# Patient Record
Sex: Female | Born: 1945
Health system: Southern US, Community
[De-identification: ages and names within clinical notes are randomized; demographics above are authoritative.]

## PROBLEM LIST (undated history)

## (undated) DIAGNOSIS — M109 Gout, unspecified: Secondary | ICD-10-CM

## (undated) DIAGNOSIS — K5900 Constipation, unspecified: Secondary | ICD-10-CM

## (undated) DIAGNOSIS — J189 Pneumonia, unspecified organism: Secondary | ICD-10-CM

## (undated) DIAGNOSIS — I10 Essential (primary) hypertension: Secondary | ICD-10-CM

## (undated) DIAGNOSIS — B1920 Unspecified viral hepatitis C without hepatic coma: Secondary | ICD-10-CM

## (undated) DIAGNOSIS — M51369 Other intervertebral disc degeneration, lumbar region without mention of lumbar back pain or lower extremity pain: Secondary | ICD-10-CM

## (undated) DIAGNOSIS — M5136 Other intervertebral disc degeneration, lumbar region: Secondary | ICD-10-CM

## (undated) DIAGNOSIS — R011 Cardiac murmur, unspecified: Secondary | ICD-10-CM

## (undated) DIAGNOSIS — Z9289 Personal history of other medical treatment: Secondary | ICD-10-CM

## (undated) DIAGNOSIS — J449 Chronic obstructive pulmonary disease, unspecified: Secondary | ICD-10-CM

## (undated) DIAGNOSIS — F419 Anxiety disorder, unspecified: Secondary | ICD-10-CM

## (undated) DIAGNOSIS — M5030 Other cervical disc degeneration, unspecified cervical region: Secondary | ICD-10-CM

## (undated) DIAGNOSIS — M1990 Unspecified osteoarthritis, unspecified site: Secondary | ICD-10-CM

## (undated) HISTORY — PX: BACK SURGERY: SHX140

## (undated) HISTORY — DX: Gout, unspecified: M10.9

## (undated) HISTORY — PX: COLONOSCOPY W/ POLYPECTOMY: SHX1380

## (undated) HISTORY — PX: APPENDECTOMY: SHX54

## (undated) HISTORY — DX: Essential (primary) hypertension: I10

## (undated) HISTORY — PX: ABDOMINAL HYSTERECTOMY: SHX81

## (undated) HISTORY — DX: Unspecified viral hepatitis C without hepatic coma: B19.20

## (undated) HISTORY — DX: Unspecified osteoarthritis, unspecified site: M19.90

## (undated) HISTORY — PX: INNER EAR SURGERY: SHX679

## (undated) HISTORY — PX: TONSILLECTOMY: SUR1361

---

## 1995-06-04 DIAGNOSIS — B1710 Acute hepatitis C without hepatic coma: Secondary | ICD-10-CM | POA: Insufficient documentation

## 1998-07-02 ENCOUNTER — Emergency Department (HOSPITAL_COMMUNITY): Admission: EM | Admit: 1998-07-02 | Discharge: 1998-07-02 | Payer: Self-pay | Admitting: Endocrinology

## 1998-07-02 ENCOUNTER — Encounter: Payer: Self-pay | Admitting: Endocrinology

## 1998-07-06 ENCOUNTER — Ambulatory Visit (HOSPITAL_COMMUNITY): Admission: RE | Admit: 1998-07-06 | Discharge: 1998-07-06 | Payer: Self-pay | Admitting: Podiatry

## 1999-03-04 ENCOUNTER — Emergency Department (HOSPITAL_COMMUNITY): Admission: EM | Admit: 1999-03-04 | Discharge: 1999-03-04 | Payer: Self-pay | Admitting: Emergency Medicine

## 1999-10-16 ENCOUNTER — Encounter: Admission: RE | Admit: 1999-10-16 | Discharge: 1999-10-16 | Payer: Self-pay | Admitting: Cardiology

## 1999-10-16 ENCOUNTER — Encounter: Payer: Self-pay | Admitting: Cardiology

## 2000-04-27 ENCOUNTER — Emergency Department (HOSPITAL_COMMUNITY): Admission: EM | Admit: 2000-04-27 | Discharge: 2000-04-27 | Payer: Self-pay | Admitting: Emergency Medicine

## 2000-04-27 ENCOUNTER — Encounter: Payer: Self-pay | Admitting: Emergency Medicine

## 2000-07-14 ENCOUNTER — Inpatient Hospital Stay (HOSPITAL_COMMUNITY): Admission: EM | Admit: 2000-07-14 | Discharge: 2000-07-21 | Payer: Self-pay | Admitting: *Deleted

## 2000-07-16 ENCOUNTER — Encounter: Payer: Self-pay | Admitting: Cardiology

## 2000-11-02 ENCOUNTER — Encounter: Payer: Self-pay | Admitting: Emergency Medicine

## 2000-11-02 ENCOUNTER — Emergency Department (HOSPITAL_COMMUNITY): Admission: EM | Admit: 2000-11-02 | Discharge: 2000-11-02 | Payer: Self-pay | Admitting: Emergency Medicine

## 2001-11-04 ENCOUNTER — Ambulatory Visit (HOSPITAL_COMMUNITY): Admission: RE | Admit: 2001-11-04 | Discharge: 2001-11-04 | Payer: Self-pay | Admitting: Cardiology

## 2001-11-04 ENCOUNTER — Encounter: Payer: Self-pay | Admitting: Cardiology

## 2002-07-26 ENCOUNTER — Encounter: Admission: RE | Admit: 2002-07-26 | Discharge: 2002-07-26 | Payer: Self-pay | Admitting: Infectious Diseases

## 2002-08-04 ENCOUNTER — Ambulatory Visit (HOSPITAL_COMMUNITY): Admission: RE | Admit: 2002-08-04 | Discharge: 2002-08-04 | Payer: Self-pay | Admitting: Infectious Diseases

## 2002-08-04 ENCOUNTER — Encounter: Payer: Self-pay | Admitting: Infectious Diseases

## 2003-01-16 ENCOUNTER — Encounter: Payer: Self-pay | Admitting: Emergency Medicine

## 2003-01-16 ENCOUNTER — Emergency Department (HOSPITAL_COMMUNITY): Admission: EM | Admit: 2003-01-16 | Discharge: 2003-01-16 | Payer: Self-pay | Admitting: Emergency Medicine

## 2003-06-25 ENCOUNTER — Emergency Department (HOSPITAL_COMMUNITY): Admission: EM | Admit: 2003-06-25 | Discharge: 2003-06-25 | Payer: Self-pay | Admitting: Emergency Medicine

## 2003-09-15 ENCOUNTER — Ambulatory Visit (HOSPITAL_COMMUNITY): Admission: RE | Admit: 2003-09-15 | Discharge: 2003-09-15 | Payer: Self-pay | Admitting: Gastroenterology

## 2003-09-29 ENCOUNTER — Ambulatory Visit (HOSPITAL_COMMUNITY): Admission: RE | Admit: 2003-09-29 | Discharge: 2003-09-29 | Payer: Self-pay | Admitting: Gastroenterology

## 2003-10-27 ENCOUNTER — Emergency Department (HOSPITAL_COMMUNITY): Admission: EM | Admit: 2003-10-27 | Discharge: 2003-10-27 | Payer: Self-pay | Admitting: Emergency Medicine

## 2003-11-24 ENCOUNTER — Ambulatory Visit (HOSPITAL_COMMUNITY): Admission: RE | Admit: 2003-11-24 | Discharge: 2003-11-24 | Payer: Self-pay | Admitting: Orthopedic Surgery

## 2003-12-29 ENCOUNTER — Encounter: Admission: RE | Admit: 2003-12-29 | Discharge: 2004-02-10 | Payer: Self-pay | Admitting: Orthopedic Surgery

## 2004-10-24 ENCOUNTER — Encounter: Admission: RE | Admit: 2004-10-24 | Discharge: 2004-10-24 | Payer: Self-pay | Admitting: Cardiology

## 2004-11-09 ENCOUNTER — Encounter: Admission: RE | Admit: 2004-11-09 | Discharge: 2004-11-09 | Payer: Self-pay | Admitting: Cardiology

## 2005-05-22 ENCOUNTER — Encounter: Admission: RE | Admit: 2005-05-22 | Discharge: 2005-05-22 | Payer: Self-pay | Admitting: Cardiology

## 2005-06-21 ENCOUNTER — Ambulatory Visit: Payer: Self-pay | Admitting: Gastroenterology

## 2005-11-21 ENCOUNTER — Ambulatory Visit (HOSPITAL_COMMUNITY): Admission: RE | Admit: 2005-11-21 | Discharge: 2005-11-21 | Payer: Self-pay | Admitting: Cardiology

## 2006-10-21 ENCOUNTER — Ambulatory Visit: Payer: Self-pay | Admitting: Gastroenterology

## 2006-10-24 ENCOUNTER — Ambulatory Visit (HOSPITAL_COMMUNITY): Admission: RE | Admit: 2006-10-24 | Discharge: 2006-10-24 | Payer: Self-pay | Admitting: Gastroenterology

## 2006-12-10 ENCOUNTER — Ambulatory Visit (HOSPITAL_COMMUNITY): Admission: RE | Admit: 2006-12-10 | Discharge: 2006-12-10 | Payer: Self-pay | Admitting: Cardiology

## 2007-02-05 ENCOUNTER — Ambulatory Visit: Payer: Self-pay | Admitting: Gastroenterology

## 2007-06-18 ENCOUNTER — Emergency Department (HOSPITAL_COMMUNITY): Admission: EM | Admit: 2007-06-18 | Discharge: 2007-06-18 | Payer: Self-pay | Admitting: Emergency Medicine

## 2008-01-15 ENCOUNTER — Ambulatory Visit (HOSPITAL_COMMUNITY): Admission: RE | Admit: 2008-01-15 | Discharge: 2008-01-15 | Payer: Self-pay | Admitting: Cardiology

## 2008-02-23 ENCOUNTER — Ambulatory Visit: Payer: Self-pay | Admitting: Gastroenterology

## 2008-05-13 ENCOUNTER — Ambulatory Visit (HOSPITAL_COMMUNITY): Admission: RE | Admit: 2008-05-13 | Discharge: 2008-05-13 | Payer: Self-pay | Admitting: Gastroenterology

## 2008-07-12 ENCOUNTER — Ambulatory Visit: Payer: Self-pay | Admitting: Gastroenterology

## 2008-10-29 ENCOUNTER — Emergency Department (HOSPITAL_COMMUNITY): Admission: EM | Admit: 2008-10-29 | Discharge: 2008-10-29 | Payer: Self-pay | Admitting: Emergency Medicine

## 2008-11-29 ENCOUNTER — Ambulatory Visit: Payer: Self-pay | Admitting: Gastroenterology

## 2009-01-06 ENCOUNTER — Inpatient Hospital Stay (HOSPITAL_COMMUNITY): Admission: AD | Admit: 2009-01-06 | Discharge: 2009-01-06 | Payer: Self-pay | Admitting: Obstetrics & Gynecology

## 2009-01-20 ENCOUNTER — Ambulatory Visit (HOSPITAL_COMMUNITY): Admission: RE | Admit: 2009-01-20 | Discharge: 2009-01-20 | Payer: Self-pay | Admitting: Cardiology

## 2009-03-23 ENCOUNTER — Emergency Department (HOSPITAL_COMMUNITY): Admission: EM | Admit: 2009-03-23 | Discharge: 2009-03-23 | Payer: Self-pay | Admitting: Emergency Medicine

## 2009-03-23 ENCOUNTER — Ambulatory Visit: Payer: Self-pay | Admitting: Gastroenterology

## 2009-04-29 ENCOUNTER — Emergency Department (HOSPITAL_COMMUNITY): Admission: EM | Admit: 2009-04-29 | Discharge: 2009-04-29 | Payer: Self-pay | Admitting: Family Medicine

## 2009-07-14 ENCOUNTER — Ambulatory Visit (HOSPITAL_COMMUNITY): Admission: RE | Admit: 2009-07-14 | Discharge: 2009-07-14 | Payer: Self-pay | Admitting: Gastroenterology

## 2009-11-02 ENCOUNTER — Ambulatory Visit: Payer: Self-pay | Admitting: Gastroenterology

## 2009-12-12 ENCOUNTER — Emergency Department (HOSPITAL_COMMUNITY): Admission: EM | Admit: 2009-12-12 | Discharge: 2009-12-12 | Payer: Self-pay | Admitting: Emergency Medicine

## 2010-02-09 ENCOUNTER — Ambulatory Visit (HOSPITAL_COMMUNITY): Admission: RE | Admit: 2010-02-09 | Discharge: 2010-02-09 | Payer: Self-pay | Admitting: Cardiology

## 2010-03-02 ENCOUNTER — Emergency Department (HOSPITAL_COMMUNITY): Admission: EM | Admit: 2010-03-02 | Discharge: 2010-03-03 | Payer: Self-pay | Admitting: Emergency Medicine

## 2010-03-03 ENCOUNTER — Emergency Department (HOSPITAL_COMMUNITY): Admission: EM | Admit: 2010-03-03 | Discharge: 2010-03-03 | Payer: Self-pay | Admitting: Family Medicine

## 2010-03-04 ENCOUNTER — Inpatient Hospital Stay (HOSPITAL_COMMUNITY): Admission: EM | Admit: 2010-03-04 | Discharge: 2010-03-10 | Payer: Self-pay | Admitting: Emergency Medicine

## 2010-03-07 LAB — CONVERTED CEMR LAB
CO2: 26 meq/L
Calcium: 8.9 mg/dL
Chloride: 101 meq/L
Potassium: 4.1 meq/L
Sodium: 134 meq/L

## 2010-03-09 LAB — CONVERTED CEMR LAB: TSH: 3.076 microintl units/mL

## 2010-03-10 ENCOUNTER — Encounter (INDEPENDENT_AMBULATORY_CARE_PROVIDER_SITE_OTHER): Payer: Self-pay | Admitting: Nurse Practitioner

## 2010-03-22 ENCOUNTER — Encounter (INDEPENDENT_AMBULATORY_CARE_PROVIDER_SITE_OTHER): Payer: Self-pay | Admitting: Nurse Practitioner

## 2010-03-22 ENCOUNTER — Ambulatory Visit: Payer: Self-pay | Admitting: Internal Medicine

## 2010-03-22 DIAGNOSIS — I10 Essential (primary) hypertension: Secondary | ICD-10-CM | POA: Insufficient documentation

## 2010-03-22 DIAGNOSIS — F411 Generalized anxiety disorder: Secondary | ICD-10-CM | POA: Insufficient documentation

## 2010-03-22 DIAGNOSIS — J45909 Unspecified asthma, uncomplicated: Secondary | ICD-10-CM | POA: Insufficient documentation

## 2010-03-22 DIAGNOSIS — K5900 Constipation, unspecified: Secondary | ICD-10-CM | POA: Insufficient documentation

## 2010-04-06 ENCOUNTER — Ambulatory Visit: Payer: Self-pay | Admitting: Nurse Practitioner

## 2010-04-16 ENCOUNTER — Telehealth (INDEPENDENT_AMBULATORY_CARE_PROVIDER_SITE_OTHER): Payer: Self-pay | Admitting: Nurse Practitioner

## 2010-04-17 ENCOUNTER — Telehealth (INDEPENDENT_AMBULATORY_CARE_PROVIDER_SITE_OTHER): Payer: Self-pay | Admitting: Nurse Practitioner

## 2010-05-14 ENCOUNTER — Encounter (INDEPENDENT_AMBULATORY_CARE_PROVIDER_SITE_OTHER): Payer: Self-pay | Admitting: Nurse Practitioner

## 2010-05-24 ENCOUNTER — Ambulatory Visit: Payer: Self-pay | Admitting: Nurse Practitioner

## 2010-05-31 ENCOUNTER — Ambulatory Visit: Payer: Self-pay | Admitting: Nurse Practitioner

## 2010-06-01 ENCOUNTER — Ambulatory Visit: Payer: Self-pay | Admitting: Nurse Practitioner

## 2010-06-22 ENCOUNTER — Telehealth (INDEPENDENT_AMBULATORY_CARE_PROVIDER_SITE_OTHER): Payer: Self-pay | Admitting: Nurse Practitioner

## 2010-06-24 ENCOUNTER — Encounter: Payer: Self-pay | Admitting: Orthopedic Surgery

## 2010-07-03 NOTE — Letter (Signed)
Summary: Handout Printed  Printed Handout:  - Diet - High-Fiber 

## 2010-07-03 NOTE — Progress Notes (Signed)
Summary: NEED THE [name] INSTEAD OF THE TRAMAOL  Phone Note Call from Patient Call back at [facility] Phone [phone] Call back at [phone] (cell)   Reason for Call: Refill Medication Summary of Call: Ashley Riddle CALLED BACK TO SAYS THAT SHE WAS SORRY, BUT SHE GAVE THE WRONG NAME OF  MEDICATION TO BE REFILLED. SHE MEANT HER XANAX NOT THE TRAMADOL. Initial call taken by: Leodis Rains,  April 17, 2010 12:41 PM  Follow-up for Phone Call        she was given rx for alprazolam on 03/22/2010 this is a 30 day supply - as reviewed with pt xanax is a SHORT acting medication so her need increases if she takes it on a day to day basis.  it is really intended to take 2-3 times per WEEK.  if she takes more than that she needs a longer acting medication in ADDITION to the xanax.  This was discussed during recent visit. I still think she needs a longer acting medication and will be willi ng to start this for her (what pharmacy does she use)  she will only get 30 xanax per month and they are not due until 04/22/2010. the goal would be to use them sparingly Follow-up by: Lehman Prom FNP,  April 17, 2010 1:10 PM  Additional Follow-up for Phone Call Additional follow up Details #1::        Left message on answering machine for pt. to return call.  Dutch Quint RN  April 17, 2010 2:14 PM  Advised as to provider's response and recommendations. States she only takes one Xanax a day at 4 pm and has done that for years.  Explained re short-acting and best as advised.  Pt. doesn't want to "go through all those changes -- never mind." Dutch Quint RN  April 17, 2010 4:40 PM     Additional Follow-up for Phone Call Additional follow up Details #2::    She has identified the problem - she has been taking it for "years" Hence she is dependent upon it. she will get no more than 30 per month.  Again would advise she start a long acting medication IN ADDITION to the xanax for maximum benefit  then she may see that she doesn't have to take the xanax every day but only if needed. since the 20th is on a sunday - i will refill xanax on Monday November 21st. what pharmacy does she want it sent to? Follow-up by: Lehman Prom FNP,  April 18, 2010 8:10 AM  Additional Follow-up for Phone Call Additional follow up Details #3:: Details for Additional Follow-up Action Taken: Left message with female for pt. to return call.  Dutch Quint RN  April 19, 2010 12:42 PM  Pt. found her pills she has enough to last until 04/22/10. She does not want to add any additional medication until she talks with the lady you had suggested. [facility] [address]. Gaylyn Cheers RN  April 20, 2010 4:38 PM

## 2010-07-03 NOTE — Progress Notes (Signed)
Summary: MEDS REFILL  Phone Note Call from Patient Call back at [phone]   Summary of Call: LISINOPRIL ([facility]) [place] AND TRAMADOL ([facility]) Initial call taken by: Domenic Polite,  April 16, 2010 4:47 PM  Follow-up for Phone Call        rx sent to pharmacies as per her request notify pt Follow-up by: Lehman Prom FNP,  April 16, 2010 6:46 PM  Additional Follow-up for Phone Call Additional follow up Details #1::        Pt called and was notified that rx's were sent  to pharmacy. Additional Follow-up by: Cheryll Dessert,  April 17, 2010 11:58 AM    Prescriptions: TRAMADOL HCL 50 MG TABS (TRAMADOL HCL) One tablet by mouth two times a day as needed for pain  #50 x 0   Entered and Authorized by:   Lehman Prom FNP   Signed by:   Lehman Prom FNP on 04/16/2010   Method used:   [facility]        [facility]  [address] [phone]* (retail)       [address]       [place], [place]  [address]       Ph: [phone] or [phone]       Fax: [phone]   RxID:   [id number] LISINOPRIL 20 MG TABS (LISINOPRIL) Two tablet by mouth daily for blood pressure  #60 x 1   Entered and Authorized by:   Lehman Prom FNP   Signed by:   Lehman Prom FNP on 04/16/2010   Method used:   Electronically to        [facility] [phone]* (retail)       [address]       [place], [place]  [address]       Ph: [phone]       Fax: [phone]   RxID:   [id number]

## 2010-07-03 NOTE — Assessment & Plan Note (Signed)
Summary: NEW - Establish Care   Vital Signs:  Patient profile:   65 year old female Height:      62.25 inches Weight:      174.6 pounds BMI:     31.79 Temp:     97.5 degrees F oral Pulse rate:   72 / minute Pulse rhythm:   regular Resp:     16 per minute BP sitting:   166 / 96  (left arm) Cuff size:   regular  Vitals Entered By: Levon Hedger (March 22, 2010 3:28 PM)  Nutrition Counseling: Patient's BMI is greater than 25 and therefore counseled on weight management options. CC: follow-up visit [facility] ...still having some tenderness in stomach, Hypertension Management Is Patient Diabetic? No Pain Assessment Patient in pain? no       Does patient need assistance? Functional Status Self care Ambulation Normal   CC:  follow-up visit [facility] ...still having some tenderness in stomach and Hypertension Management.  History of Present Illness:  Pt into the office to establish care. Previous PCP was Dr. Shana Chute.  Last visit there was 1 year ago.  Hospitalized 03/03/2010 - 03/10/2010 for abdominal pain. Pain was present for 1 week before presentaon to the ER.  Pain progressed.  1.  Acute adominal pain most likely due for pain/obstruction.    2.  Constipation - pt was treated accordingly in the hospital  3.  Hypotension, transient - Blood pressure medication were held while pt was in the hospital.  She has not restarted them at time of visit today.  4.  Asthma   Social - Employed at [employer]  Hypertension History:      She denies headache, chest pain, and palpitations.        Positive major cardiovascular risk factors include female age 17 years old or older and hypertension.  Negative major cardiovascular risk factors include no history of diabetes or hyperlipidemia and non-tobacco-user status.     Habits & Providers  Alcohol-Tobacco-Diet     Alcohol drinks/day: 0     Tobacco Status: quit     Tobacco Counseling: to remain off tobacco products     Year  Started: age 22     Year Quit: 03/2010  Exercise-Depression-Behavior     Drug Use: past  Allergies (verified): 1)  ! Penicillin 2)  ! Codeine  Past History:  Past Surgical History: C- section x 3 Appendectomy lumbar disectomy in 1989 Hemorrhoidectomy  Family History: mother - diabetes, breast cancer (deceased) multiple maternal aunts with breast cancer father - noncontributory  Social History: 3 children  tobacco - quit 03/2010 ETOH - none Drug - clean from heroin for at least 20 yearsSmoking Status:  quit Drug Use:  past  Review of Systems General:  Denies fever. CV:  Denies chest pain or discomfort. Resp:  Denies cough. GI:  Denies abdominal pain, nausea, and vomiting.  Physical Exam  General:  alert.   Head:  normocephalic.   Ears:  ear piercing(s) noted.   Lungs:  normal breath sounds.   Heart:  normal rate and regular rhythm.   Neurologic:  alert & oriented X3.     Impression & Recommendations:  Problem # 1:  ANXIETY (ICD-300.00) advised pt that if she is taking xanax daily then she will need a longer acting medication Her updated medication list for this problem includes:    Alprazolam 1 Mg Tabs (Alprazolam) ..... One tablet by mouth daily as needed for anxiety  Problem # 2:  HYPERTENSION, BENIGN ESSENTIAL (ICD-401.1)   BP is elevated today Will start lisinopril 20mg    Her updated medication list for this problem includes:    Lisinopril 20 Mg Tabs (Lisinopril) ..... One tablet by mouth daily for blood pressure  Problem # 3:  HEPATITIS C (ICD-070.51) ongoing f/u with Medical specialty services  Problem # 4:  NEED PROPHYLACTIC VACCINATION&INOCULATION FLU (ICD-V04.81) give today  Problem # 5:  CONSTIPATION (ICD-564.00) advise pt to eat high fiber diet  Complete Medication List: 1)  Aspirin 81 Mg Tabs (Aspirin) .... One tablet by mouth daily 2)  Multivitamins Tabs (Multiple vitamin) .... One tablet by mouth daily 3)  Alprazolam 1 Mg Tabs  (Alprazolam) .... One tablet by mouth daily as needed for anxiety 4)  Tramadol Hcl 50 Mg Tabs (Tramadol hcl) .... One tablet by mouth two times a day as needed for pain 5)  Lisinopril 20 Mg Tabs (Lisinopril) .... One tablet by mouth daily for blood pressure  Other Orders: Flu Vaccine 63yrs + (40347) Admin 1st Vaccine (42595)  Hypertension Assessment/Plan:      The patient's hypertensive risk group is category B: At least one risk factor (excluding diabetes) with no target organ damage.  Today's blood pressure is 166/96.  Her blood pressure goal is < 140/90.  Patient Instructions: 1)  Be sure to drink plenty of water 2)  High fiber diet with raisins, bran bread, apples. 3)  You have received the flu vaccine today 4)  Blood pressure - start lisinopril 20mg  by mouth daily. Remember to avoiid salty foods 5)  Follow up in 3 weeks with n.martin,[facility]p for blood pressure Prescriptions: LISINOPRIL 20 MG TABS (LISINOPRIL) One tablet by mouth daily for blood pressure  #30 x 1   Entered and Authorized by:   Lehman Prom FNP   Signed by:   Lehman Prom FNP on 03/22/2010   Method used:   Print then Give to Patient   RxID:   [id number] ALPRAZOLAM 1 MG TABS (ALPRAZOLAM) One tablet by mouth daily as needed for anxiety  #30 x 0   Entered and Authorized by:   Lehman Prom FNP   Signed by:   Lehman Prom FNP on 03/22/2010   Method used:   Print then Give to Patient   RxID:   [id number]    Orders Added: 1)  New Patient Level III [55732] 2)  Flu Vaccine 5yrs + [20254] 3)  Admin 1st Vaccine [27062]   Immunizations Administered:  Influenza Vaccine # 1:    Vaccine Type: Fluvax 3+    Site: right deltoid    Mfr: GlaxoSmithKline    Dose: 0.5 ml    Route: IM    Given by: Levon Hedger    Exp. Date: 12/01/2010    Lot #: [id number]    VIS given: 12/26/09 version given March 22, 2010.  Flu Vaccine Consent Questions:    Do you have a history of severe allergic reactions to  this vaccine? no    Any prior history of allergic reactions to egg and/or gelatin? no    Do you have a sensitivity to the preservative Thimersol? no    Do you have a past history of Guillan-Barre Syndrome? no    Do you currently have an acute febrile illness? no    Have you ever had a severe reaction to latex? no    Vaccine information given and explained to patient? yes    Are you currently pregnant? no    ndc  [phone]  Immunizations  Administered:  Influenza Vaccine # 1:    Vaccine Type: Fluvax 3+    Site: right deltoid    Mfr: GlaxoSmithKline    Dose: 0.5 ml    Route: IM    Given by: Levon Hedger    Exp. Date: 12/01/2010    Lot #: [id number]    VIS given: 12/26/09 version given March 22, 2010.   CT of Abdomen  Procedure date:  03/03/2010  Findings:      cirrhotic liver with minimal splenic enlargement, tiny umbilical hernia containing fat, no definitive acute intra-abdominal abnormalities  X-ray  Procedure date:  03/04/2010  Findings:      Abdominal films stable mild chronic bronchitic changes and mild chronic interstitial lung disease, stable prominent aortic arch, interval minimal, left basilar atelectasis or scarring  [place] of Abdomen  Procedure date:  03/04/2010  Findings:      showed subotimal visualization of aorta and pancreas due to bowel gas, questionable slightly, nodule or contours of liver which could reflect cirrhosis  MISC. Report  Procedure date:  03/04/2010  Findings:      CT angiogram - scattered atherosclerosis in the aorta, maor branch vessels are patent.  No evidence of mesentary ischemia.  Findings compatible with cirrhosis. No evidence of portal venous hypertension  EGD  Procedure date:  03/05/2010  Findings:      normal  X-ray  Procedure date:  03/08/2010  Findings:      No acute findings, moderate stool burden in ascending colon repeat on March 10, 2010 - decreased stool burden, no findings for obstruction or  perforation   CT of Abdomen  Procedure date:  03/03/2010  Findings:      cirrhotic liver with minimal splenic enlargement, tiny umbilical hernia containing fat, no definitive acute intra-abdominal abnormalities  X-ray  Procedure date:  03/04/2010  Findings:      Abdominal films stable mild chronic bronchitic changes and mild chronic interstitial lung disease, stable prominent aortic arch, interval minimal, left basilar atelectasis or scarring  [place] of Abdomen  Procedure date:  03/04/2010  Findings:      showed subotimal visualization of aorta and pancreas due to bowel gas, questionable slightly, nodule or contours of liver which could reflect cirrhosis  MISC. Report  Procedure date:  03/04/2010  Findings:      CT angiogram - scattered atherosclerosis in the aorta, maor branch vessels are patent.  No evidence of mesentary ischemia.  Findings compatible with cirrhosis. No evidence of portal venous hypertension  EGD  Procedure date:  03/05/2010  Findings:      normal  X-ray  Procedure date:  03/08/2010  Findings:      No acute findings, moderate stool burden in ascending colon repeat on March 10, 2010 - decreased stool burden, no findings for obstruction or perforation

## 2010-07-03 NOTE — Assessment & Plan Note (Signed)
Summary: HTN   Vital Signs:  Patient profile:   65 year old female Weight:      177.0 pounds Temp:     97.5 degrees F oral Pulse rate:   72 / minute Pulse rhythm:   regular Resp:     16 per minute BP sitting:   170 / 78  (left arm) Cuff size:   regular  Vitals Entered By: Levon Hedger (April 06, 2010 2:04 PM) CC: follow-up visit 2 weeks BP, Hypertension Management, Depression Is Patient Diabetic? No Pain Assessment Patient in pain? no       Does patient need assistance? Functional Status Self care Ambulation Normal   CC:  follow-up visit 2 weeks BP, Hypertension Management, and Depression.  History of Present Illness:  Pt into the office for f/u on high blood pressure. Pt has started lisinopril 20mg  by mouth daily for blood pressure.  She presents today with all her medications. The patient denies that she feels like life is not worth living, denies that she wishes that she were dead, and denies that she has thought about ending her life.         Hypertension History:      She denies headache, chest pain, and palpitations.        Positive major cardiovascular risk factors include female age 17 years old or older and hypertension.  Negative major cardiovascular risk factors include no history of diabetes or hyperlipidemia and non-tobacco-user status.      Habits & Providers  Alcohol-Tobacco-Diet     Alcohol drinks/day: 0     Tobacco Status: quit     Tobacco Counseling: to remain off tobacco products     Year Started: age 26     Year Quit: 03/2010  Exercise-Depression-Behavior     Have you felt down or hopeless? yes     Have you felt little pleasure in things? yes     Depression Counseling: not indicated; screening negative for depression     Drug Use: past  Medications Prior to Update: 1)  Aspirin 81 Mg Tabs (Aspirin) .... One Tablet By Mouth Daily 2)  Multivitamins  Tabs (Multiple Vitamin) .... One Tablet By Mouth Daily 3)  Alprazolam 1 Mg Tabs  (Alprazolam) .... One Tablet By Mouth Daily As Needed For Anxiety 4)  Tramadol Hcl 50 Mg Tabs (Tramadol Hcl) .... One Tablet By Mouth Two Times A Day As Needed For Pain 5)  Lisinopril 20 Mg Tabs (Lisinopril) .... One Tablet By Mouth Daily For Blood Pressure  Current Medications (verified): 1)  Aspirin 81 Mg Tabs (Aspirin) .... One Tablet By Mouth Daily 2)  Multivitamins  Tabs (Multiple Vitamin) .... One Tablet By Mouth Daily 3)  Alprazolam 1 Mg Tabs (Alprazolam) .... One Tablet By Mouth Daily As Needed For Anxiety 4)  Tramadol Hcl 50 Mg Tabs (Tramadol Hcl) .... One Tablet By Mouth Two Times A Day As Needed For Pain 5)  Lisinopril 20 Mg Tabs (Lisinopril) .... One Tablet By Mouth Daily For Blood Pressure  Allergies (verified): 1)  ! Penicillin 2)  ! Codeine  Review of Systems General:  Denies fever. CV:  Denies chest pain or discomfort. Resp:  Denies cough. GI:  Denies abdominal pain, nausea, and vomiting.  Physical Exam  General:  alert.   Head:  normocephalic.   Ears:  ear piercing(s) noted.   Lungs:  normal breath sounds.   Heart:  normal rate and regular rhythm.   Abdomen:  normal bowel sounds.  Msk:  up to the exam table Neurologic:  alert & oriented X3.   Psych:  tearful during exam   Impression & Recommendations:  Problem # 1:  HYPERTENSION, BENIGN ESSENTIAL (ICD-401.1) BP still elevated today will increase lisinopril Her updated medication list for this problem includes:    Lisinopril 20 Mg Tabs (Lisinopril) .Marland Kitchen..Marland Kitchen Two tablet by mouth daily for blood pressure  Problem # 2:  CONSTIPATION (ICD-564.00) stable at this time Her updated medication list for this problem includes:    Lactulose 10 Gm/38ml Soln (Lactulose) .Marland KitchenMarland KitchenMarland KitchenMarland Kitchen 15-76ml by mouth daily as needed    Docusate Sodium 100 Mg Caps (Docusate sodium) ..... One capsule by mouth daily with senna    Senokot S 8.6-50 Mg Tabs (Sennosides-docusate sodium) .Marland Kitchen... 2 tablets at bedtime for docusate capsule  Problem # 3:   ANXIETY (ICD-300.00)  Her updated medication list for this problem includes:    Alprazolam 1 Mg Tabs (Alprazolam) ..... One tablet by mouth daily as needed for anxiety  Orders: Misc. Referral (Misc. Ref)  Problem # 4:  HEPATITIS C (ICD-070.51) pt to keep up with appt with liver specialist as ordered  Complete Medication List: 1)  Aspirin 81 Mg Tabs (Aspirin) .... One tablet by mouth daily 2)  Multivitamins Tabs (Multiple vitamin) .... One tablet by mouth daily 3)  Alprazolam 1 Mg Tabs (Alprazolam) .... One tablet by mouth daily as needed for anxiety 4)  Tramadol Hcl 50 Mg Tabs (Tramadol hcl) .... One tablet by mouth two times a day as needed for pain 5)  Lisinopril 20 Mg Tabs (Lisinopril) .... Two tablet by mouth daily for blood pressure 6)  Polyethylene Glycol 3350 Liqd (Polyethylene glycol 3350) .Marland Kitchen.. 1 capful mixed with 8 ounces of wate daily 7)  Lactulose 10 Gm/58ml Soln (Lactulose) .Marland KitchenMarland KitchenMarland Kitchen 15-75ml by mouth daily as needed 8)  Omeprazole 20 Mg Cpdr (Omeprazole) .... One tablet by mouth daily before breakfast 9)  Docusate Sodium 100 Mg Caps (Docusate sodium) .... One capsule by mouth daily with senna 10)  Senokot S 8.6-50 Mg Tabs (Sennosides-docusate sodium) .... 2 tablets at bedtime for docusate capsule  Hypertension Assessment/Plan:      The patient's hypertensive risk group is category B: At least one risk factor (excluding diabetes) with no target organ damage.  Today's blood pressure is 170/78.  Her blood pressure goal is < 140/90.  Patient Instructions: 1)  Blood pressure is still elevated.  Increase lisinopril 20mg  - 2 tablets by mouth daily.  Use the tablets that you have already. 2)  Blood pressure check with triage nurse in 7-10 days. 3)  Take your blood pressure medications before this visit. 4)  Schedule an appointment with Aquilla Solian for counseling. 5)  Schedule an appointment for a complete physical exam. 6)  You will need PAP, mammogram, EKG   Orders Added: 1)   Est. Patient Level III [27253] 2)  Misc. Referral [Misc. Ref]

## 2010-07-03 NOTE — Letter (Signed)
Summary: Discharge Summary  Discharge Summary   Imported By: Arta Bruce 03/27/2010 12:36:31  _____________________________________________________________________  External Attachment:    Type:   Image     Comment:   External Document

## 2010-07-05 NOTE — Letter (Signed)
Summary: MAILED REQUESTED RECORDS TO [facility]  MAILED REQUESTED RECORDS TO [facility]   Imported By: Arta Bruce 05/14/2010 10:57:23  _____________________________________________________________________  External Attachment:    Type:   Image     Comment:   External Document

## 2010-07-05 NOTE — Progress Notes (Signed)
Summary: refill on lisinopril  Phone Note Call from Patient Call back at Home Phone [phone]   Reason for Call: Refill Medication Summary of Call: Ashley PT. Ashley Riddle SAYS SHE NEEDS A REFILL ON HER LISINOPRIL AT [facility] ON [address]. Initial call taken by: Leodis Rains,  June 22, 2010 12:51 PM  Follow-up for Phone Call        Rx sent to [facility] [address] as per note notify pt to check there Follow-up by: Lehman Prom FNP,  June 22, 2010 2:23 PM  Additional Follow-up for Phone Call Additional follow up Details #1::        Left message with female for pt. to return call.  Dutch Quint RN  June 22, 2010 2:51 PM     Additional Follow-up for Phone Call Additional follow up Details #2::    Ashley Riddle CALLED BACK, AND SHE IS AWARE THAT HER RX WAS SENT TO [facility]. Follow-up by: Leodis Rains,  June 22, 2010 4:03 PM  Prescriptions: LISINOPRIL 20 MG TABS (LISINOPRIL) Two tablet by mouth daily for blood pressure  #60 x 1   Entered and Authorized by:   Lehman Prom FNP   Signed by:   Lehman Prom FNP on 06/22/2010   Method used:   Electronically to        [facility] [phone]* (retail)       [address]       [place], [place]  [address]       Ph: [phone]       Fax: [phone]   RxID:   [id number]

## 2010-07-16 ENCOUNTER — Encounter: Payer: Self-pay | Admitting: Nurse Practitioner

## 2010-07-16 ENCOUNTER — Encounter (INDEPENDENT_AMBULATORY_CARE_PROVIDER_SITE_OTHER): Payer: Self-pay | Admitting: Nurse Practitioner

## 2010-07-25 NOTE — Assessment & Plan Note (Signed)
Summary: HTN   Vital Signs:  Patient profile:   65 year old female Weight:      178.5 pounds BMI:     32.50 Pulse rate:   72 / minute Pulse rhythm:   regular Resp:     16 per minute BP sitting:   180 / 90  (left arm) Cuff size:   regular  Vitals Entered By: Levon Hedger (July 16, 2010 2:20 PM)  Nutrition Counseling: Patient's BMI is greater than 25 and therefore counseled on weight management options. CC: follow-up visit HTN...knee pain , Hypertension Management Is Patient Diabetic? No Pain Assessment Patient in pain? no       Does patient need assistance? Functional Status Self care Ambulation Normal   CC:  follow-up visit HTN...knee pain  and Hypertension Management.  History of Present Illness:  Pt into the office for f/u on htn.  Hepatitis C - Pt maintains follow up with specialist every 6 months  Asthma History    Initial Asthma Severity Rating:    Age range: 12+ years    Symptoms: 0-2 days/week    Nighttime Awakenings: 0-2/month    Interferes w/ normal activity: no limitations    SABA use (not for EIB): 0-2 days/week    Asthma Severity Assessment: Intermittent  Hypertension History:      She denies headache, chest pain, and palpitations.        Positive major cardiovascular risk factors include female age 13 years old or older and hypertension.  Negative major cardiovascular risk factors include no history of diabetes or hyperlipidemia and non-tobacco-user status.        Further assessment for target organ damage reveals no history of ASHD, cardiac end-organ damage (CHF/LVH), stroke/TIA, peripheral vascular disease, renal insufficiency, or hypertensive retinopathy.      Habits & Providers  Alcohol-Tobacco-Diet     Alcohol drinks/day: 0     Tobacco Status: quit     Tobacco Counseling: to remain off tobacco products     Year Started: age 89     Year Quit: 03/2010  Exercise-Depression-Behavior     Does Patient Exercise: no     Depression  Counseling: not indicated; screening negative for depression     Drug Use: past  Allergies (verified): 1)  ! Penicillin 2)  ! Codeine  Social History: Does Patient Exercise:  no  Review of Systems CV:  Denies chest pain or discomfort. Resp:  Denies cough. GI:  Denies abdominal pain, constipation, nausea, and vomiting; BP are soft and are doing much better. MS:  Complains of joint pain and stiffness. Psych:  Complains of depression; Pt has been to [facility] as ordered by this provider.  Still with some feelings of saddness about past events and her current relationship with adult children.  Physical Exam  General:  alert.   Ears:  ear piercing(s) noted.   Nose:  right nare ring Lungs:  normal breath sounds.   Heart:  normal rate and regular rhythm.   Abdomen:  normal bowel sounds.   Msk:  up to the exam table Neurologic:  alert & oriented X3.   Skin:  color normal.   Psych:  Oriented X3.     Impression & Recommendations:  Problem # 1:  HYPERTENSION, BENIGN ESSENTIAL (ICD-401.1) BP is still elevated will need to change meds Her updated medication list for this problem includes:    Amlodipine Besylate 5 Mg Tabs (Amlodipine besylate) ..... One tablet by mouth daily for blood pressure    Lisinopril  40 Mg Tabs (Lisinopril) ..... One tablet by mouth daily for blood pressure  Problem # 2:  ANXIETY (ICD-300.00) pt has been to [facility] as ordered Her updated medication list for this problem includes:    Alprazolam 1 Mg Tabs (Alprazolam) ..... One tablet by mouth daily as needed for anxiety  Problem # 3:  CONSTIPATION (ICD-564.00) pt reports that stool is doing much better Her updated medication list for this problem includes:    Lactulose 10 Gm/31ml Soln (Lactulose) .Marland KitchenMarland KitchenMarland KitchenMarland Kitchen 15-68ml by mouth daily as needed    Docusate Sodium 100 Mg Caps (Docusate sodium) ..... One capsule by mouth daily with senna    Senokot S 8.6-50 Mg Tabs (Sennosides-docusate sodium) .Marland Kitchen... 2 tablets  at bedtime for docusate capsule  Problem # 4:  HEPATITIS C (ICD-070.51) pt to maintain f/u at [facility]  Complete Medication List: 1)  Aspirin 81 Mg Tabs (Aspirin) .... One tablet by mouth daily 2)  Multivitamins Tabs (Multiple vitamin) .... One tablet by mouth daily 3)  Alprazolam 1 Mg Tabs (Alprazolam) .... One tablet by mouth daily as needed for anxiety 4)  Tramadol Hcl 50 Mg Tabs (Tramadol hcl) .... One tablet by mouth two times a day as needed for pain 5)  Amlodipine Besylate 5 Mg Tabs (Amlodipine besylate) .... One tablet by mouth daily for blood pressure 6)  Polyethylene Glycol 3350 Liqd (Polyethylene glycol 3350) .Marland Kitchen.. 1 capful mixed with 8 ounces of wate daily 7)  Lactulose 10 Gm/80ml Soln (Lactulose) .Marland KitchenMarland KitchenMarland Kitchen 15-33ml by mouth daily as needed 8)  Omeprazole 20 Mg Cpdr (Omeprazole) .... One tablet by mouth daily before breakfast 9)  Docusate Sodium 100 Mg Caps (Docusate sodium) .... One capsule by mouth daily with senna 10)  Senokot S 8.6-50 Mg Tabs (Sennosides-docusate sodium) .... 2 tablets at bedtime for docusate capsule 11)  Lisinopril 40 Mg Tabs (Lisinopril) .... One tablet by mouth daily for blood pressure  Hypertension Assessment/Plan:      The patient's hypertensive risk group is category B: At least one risk factor (excluding diabetes) with no target organ damage.  Today's blood pressure is 180/90.  Her blood pressure goal is < 140/90.  Patient Instructions: 1)  Blood pressure is stil high. 2)  Keep taking lisinopril 40mg  (ONE tablet by mouth daily) 3)  AND 4)  Amlodipine 5mg  by mouth daily 5)  Blood pressure check in 2 weeks.  Take your blood pressure medications before that visit and bring the bottles into this office. 6)  Follow up with the provider in 3 months for high blood pressure. Prescriptions: LISINOPRIL 40 MG TABS (LISINOPRIL) One tablet by mouth daily for blood pressure  #30 x 5   Entered and Authorized by:   Lehman Prom FNP   Signed by:   Lehman Prom FNP on 07/16/2010   Method used:   Print then Give to Patient   RxID:   [id number] AMLODIPINE BESYLATE 5 MG TABS (AMLODIPINE BESYLATE) One tablet by mouth daily for blood pressure  #30 x 5   Entered and Authorized by:   Lehman Prom FNP   Signed by:   Lehman Prom FNP on 07/16/2010   Method used:   Print then Give to Patient   RxID:   [id number]    Orders Added: 1)  Est. Patient Level III [84696]

## 2010-08-06 ENCOUNTER — Encounter (INDEPENDENT_AMBULATORY_CARE_PROVIDER_SITE_OTHER): Payer: Self-pay | Admitting: Nurse Practitioner

## 2010-08-06 ENCOUNTER — Encounter: Payer: Self-pay | Admitting: Nurse Practitioner

## 2010-08-14 NOTE — Assessment & Plan Note (Signed)
Summary: BP check   Nurse Visit   Vital Signs:  Patient profile:   65 year old female Pulse rate:   76 / minute BP sitting:   132 / 78  (left arm) Cuff size:   regular  Vitals Entered By: Hale Drone CMA (August 06, 2010 3:56 PM)  Patient Instructions: 1)  Per Lehman Prom, FNP 2)  Blood pressure is much better  3)  Continue taking your blood pressure medications as instructed 4)  Follow up as needed 5)  Keep appointment in May  CC: Here for BP check -- Takes her Lisinopril 40mg  and Amlodipine 5mg  daily. Takes her meds before going to work between the hours of 1-4am. On Fridays and Sat. she takes it around 6:30am. -- Took her BP med this morning at 4am.    Allergies: 1)  ! Penicillin 2)  ! Codeine  Orders Added: 1)  Est. Patient Nurse visit [09003]

## 2010-08-15 LAB — DIFFERENTIAL
Basophils Absolute: 0 10*3/uL (ref 0.0–0.1)
Basophils Absolute: 0 K/uL (ref 0.0–0.1)
Basophils Relative: 0 % (ref 0–1)
Basophils Relative: 0 % (ref 0–1)
Eosinophils Absolute: 0.2 10*3/uL (ref 0.0–0.7)
Eosinophils Relative: 3 % (ref 0–5)
Lymphocytes Relative: 32 % (ref 12–46)
Lymphocytes Relative: 51 % — ABNORMAL HIGH (ref 12–46)
Lymphs Abs: 1.5 10*3/uL (ref 0.7–4.0)
Monocytes Absolute: 0.6 K/uL (ref 0.1–1.0)
Monocytes Relative: 13 % — ABNORMAL HIGH (ref 3–12)
Neutro Abs: 2.2 10*3/uL (ref 1.7–7.7)
Neutro Abs: 2.5 10*3/uL (ref 1.7–7.7)
Neutrophils Relative %: 39 % — ABNORMAL LOW (ref 43–77)
Neutrophils Relative %: 52 % (ref 43–77)

## 2010-08-15 LAB — CBC
HCT: 31.4 % — ABNORMAL LOW (ref 36.0–46.0)
HCT: 31.6 % — ABNORMAL LOW (ref 36.0–46.0)
HCT: 33 % — ABNORMAL LOW (ref 36.0–46.0)
HCT: 33.1 % — ABNORMAL LOW (ref 36.0–46.0)
HCT: 33.9 % — ABNORMAL LOW (ref 36.0–46.0)
Hemoglobin: 10.5 g/dL — ABNORMAL LOW (ref 12.0–15.0)
Hemoglobin: 10.7 g/dL — ABNORMAL LOW (ref 12.0–15.0)
Hemoglobin: 10.8 g/dL — ABNORMAL LOW (ref 12.0–15.0)
Hemoglobin: 11.1 g/dL — ABNORMAL LOW (ref 12.0–15.0)
Hemoglobin: 11.4 g/dL — ABNORMAL LOW (ref 12.0–15.0)
MCH: 28.2 pg (ref 26.0–34.0)
MCH: 28.2 pg (ref 26.0–34.0)
MCH: 28.5 pg (ref 26.0–34.0)
MCH: 28.7 pg (ref 26.0–34.0)
MCH: 29.8 pg (ref 26.0–34.0)
MCHC: 33.4 g/dL (ref 30.0–36.0)
MCHC: 33.5 g/dL (ref 30.0–36.0)
MCHC: 33.5 g/dL (ref 30.0–36.0)
MCHC: 33.6 g/dL (ref 30.0–36.0)
MCHC: 33.8 g/dL (ref 30.0–36.0)
MCV: 84.1 fL (ref 78.0–100.0)
MCV: 85.1 fL (ref 78.0–100.0)
MCV: 85.1 fL (ref 78.0–100.0)
MCV: 85.5 fL (ref 78.0–100.0)
MCV: 85.6 fL (ref 78.0–100.0)
MCV: 88.7 fL (ref 78.0–100.0)
Platelets: 114 K/uL — ABNORMAL LOW (ref 150–400)
Platelets: 116 10*3/uL — ABNORMAL LOW (ref 150–400)
Platelets: 118 10*3/uL — ABNORMAL LOW (ref 150–400)
Platelets: 124 10*3/uL — ABNORMAL LOW (ref 150–400)
RBC: 3.69 MIL/uL — ABNORMAL LOW (ref 3.87–5.11)
RBC: 3.7 MIL/uL — ABNORMAL LOW (ref 3.87–5.11)
RBC: 3.76 MIL/uL — ABNORMAL LOW (ref 3.87–5.11)
RBC: 3.82 MIL/uL — ABNORMAL LOW (ref 3.87–5.11)
RBC: 3.83 MIL/uL — ABNORMAL LOW (ref 3.87–5.11)
RBC: 3.86 MIL/uL — ABNORMAL LOW (ref 3.87–5.11)
RBC: 4.11 MIL/uL (ref 3.87–5.11)
RDW: 12 % (ref 11.5–15.5)
RDW: 12.4 % (ref 11.5–15.5)
RDW: 13.1 % (ref 11.5–15.5)
WBC: 4.8 K/uL (ref 4.0–10.5)
WBC: 5.6 10*3/uL (ref 4.0–10.5)
WBC: 5.7 10*3/uL (ref 4.0–10.5)
WBC: 7.3 10*3/uL (ref 4.0–10.5)

## 2010-08-15 LAB — URINALYSIS, ROUTINE W REFLEX MICROSCOPIC
Bilirubin Urine: NEGATIVE
Bilirubin Urine: NEGATIVE
Bilirubin Urine: NEGATIVE
Glucose, UA: NEGATIVE mg/dL
Glucose, UA: NEGATIVE mg/dL
Hgb urine dipstick: NEGATIVE
Ketones, ur: NEGATIVE mg/dL
Ketones, ur: NEGATIVE mg/dL
Ketones, ur: NEGATIVE mg/dL
Nitrite: NEGATIVE
Nitrite: NEGATIVE
Nitrite: NEGATIVE
Protein, ur: NEGATIVE mg/dL
Specific Gravity, Urine: 1.01 (ref 1.005–1.030)
Specific Gravity, Urine: 1.016 (ref 1.005–1.030)
Urobilinogen, UA: 0.2 mg/dL (ref 0.0–1.0)
pH: 5.5 (ref 5.0–8.0)
pH: 6.5 (ref 5.0–8.0)
pH: 7 (ref 5.0–8.0)

## 2010-08-15 LAB — LIPASE, BLOOD
Lipase: 29 U/L (ref 11–59)
Lipase: 31 U/L (ref 11–59)
Lipase: 32 U/L (ref 11–59)

## 2010-08-15 LAB — COMPREHENSIVE METABOLIC PANEL WITH GFR
AST: 63 U/L — ABNORMAL HIGH (ref 0–37)
Albumin: 3.1 g/dL — ABNORMAL LOW (ref 3.5–5.2)
Alkaline Phosphatase: 103 U/L (ref 39–117)
Chloride: 105 meq/L (ref 96–112)
GFR calc Af Amer: >60 mL/min (ref >60)
Potassium: 4 meq/L (ref 3.5–5.1)
Sodium: 138 meq/L (ref 135–145)
Total Bilirubin: 0.3 mg/dL (ref 0.3–1.2)
Total Protein: 7.4 g/dL (ref 6.0–8.3)

## 2010-08-15 LAB — URINE CULTURE
Colony Count: NO GROWTH
Colony Count: NO GROWTH
Culture  Setup Time: 201110041810
Culture: NO GROWTH
Special Requests: NEGATIVE

## 2010-08-15 LAB — URINE MICROSCOPIC-ADD ON

## 2010-08-15 LAB — T4, FREE: Free T4: 1.07 ng/dL (ref 0.80–1.80)

## 2010-08-15 LAB — COMPREHENSIVE METABOLIC PANEL
ALT: 36 U/L — ABNORMAL HIGH (ref 0–35)
ALT: 43 U/L — ABNORMAL HIGH (ref 0–35)
ALT: 49 U/L — ABNORMAL HIGH (ref 0–35)
AST: 50 U/L — ABNORMAL HIGH (ref 0–37)
AST: 64 U/L — ABNORMAL HIGH (ref 0–37)
Albumin: 3 g/dL — ABNORMAL LOW (ref 3.5–5.2)
Alkaline Phosphatase: 103 U/L (ref 39–117)
Alkaline Phosphatase: 84 U/L (ref 39–117)
Alkaline Phosphatase: 91 U/L (ref 39–117)
Alkaline Phosphatase: 97 U/L (ref 39–117)
BUN: 2 mg/dL — ABNORMAL LOW (ref 6–23)
BUN: 3 mg/dL — ABNORMAL LOW (ref 6–23)
BUN: 4 mg/dL — ABNORMAL LOW (ref 6–23)
CO2: 28 mEq/L (ref 19–32)
CO2: 29 mEq/L (ref 19–32)
CO2: 30 mEq/L (ref 19–32)
Calcium: 8.8 mg/dL (ref 8.4–10.5)
Calcium: 9.2 mg/dL (ref 8.4–10.5)
Chloride: 100 mEq/L (ref 96–112)
Chloride: 105 mEq/L (ref 96–112)
Creatinine, Ser: 0.45 mg/dL (ref 0.4–1.2)
Creatinine, Ser: 0.47 mg/dL (ref 0.4–1.2)
GFR calc Af Amer: >60 mL/min (ref >60)
GFR calc non Af Amer: >60 mL/min (ref >60)
GFR calc non Af Amer: >60 mL/min (ref >60)
GFR calc non Af Amer: >60 mL/min (ref >60)
GFR calc non Af Amer: >60 mL/min (ref >60)
Glucose, Bld: 113 mg/dL — ABNORMAL HIGH (ref 70–99)
Glucose, Bld: 115 mg/dL — ABNORMAL HIGH (ref 70–99)
Glucose, Bld: 145 mg/dL — ABNORMAL HIGH (ref 70–99)
Glucose, Bld: 87 mg/dL (ref 70–99)
Potassium: 3.6 mEq/L (ref 3.5–5.1)
Potassium: 3.8 mEq/L (ref 3.5–5.1)
Potassium: 3.9 mEq/L (ref 3.5–5.1)
Sodium: 134 mEq/L — ABNORMAL LOW (ref 135–145)
Sodium: 135 mEq/L (ref 135–145)
Sodium: 137 mEq/L (ref 135–145)
Total Bilirubin: 0.8 mg/dL (ref 0.3–1.2)
Total Bilirubin: 1.1 mg/dL (ref 0.3–1.2)
Total Protein: 7.3 g/dL (ref 6.0–8.3)

## 2010-08-15 LAB — BASIC METABOLIC PANEL
BUN: 3 mg/dL — ABNORMAL LOW (ref 6–23)
CO2: 26 mEq/L (ref 19–32)
CO2: 27 mEq/L (ref 19–32)
CO2: 28 mEq/L (ref 19–32)
Calcium: 8.8 mg/dL (ref 8.4–10.5)
Calcium: 8.9 mg/dL (ref 8.4–10.5)
Calcium: 9.1 mg/dL (ref 8.4–10.5)
Chloride: 101 mEq/L (ref 96–112)
Chloride: 104 mEq/L (ref 96–112)
Chloride: 104 mEq/L (ref 96–112)
Creatinine, Ser: 0.56 mg/dL (ref 0.4–1.2)
Creatinine, Ser: 0.63 mg/dL (ref 0.4–1.2)
GFR calc Af Amer: >60 mL/min (ref >60)
GFR calc Af Amer: >60 mL/min (ref >60)
Glucose, Bld: 118 mg/dL — ABNORMAL HIGH (ref 70–99)
Glucose, Bld: 118 mg/dL — ABNORMAL HIGH (ref 70–99)
Glucose, Bld: 134 mg/dL — ABNORMAL HIGH (ref 70–99)
Potassium: 3.7 mEq/L (ref 3.5–5.1)
Sodium: 137 mEq/L (ref 135–145)
Sodium: 138 mEq/L (ref 135–145)

## 2010-08-15 LAB — RAPID URINE DRUG SCREEN, HOSP PERFORMED
Amphetamines: NOT DETECTED
Barbiturates: NOT DETECTED
Benzodiazepines: POSITIVE — AB
Opiates: POSITIVE — AB

## 2010-08-15 LAB — GLUCOSE, CAPILLARY
Glucose-Capillary: 120 mg/dL — ABNORMAL HIGH (ref 70–99)
Glucose-Capillary: 161 mg/dL — ABNORMAL HIGH (ref 70–99)
Glucose-Capillary: 172 mg/dL — ABNORMAL HIGH (ref 70–99)
Glucose-Capillary: 97 mg/dL (ref 70–99)

## 2010-08-15 LAB — CULTURE, BLOOD (ROUTINE X 2)
Culture  Setup Time: 201110050005
Culture: NO GROWTH

## 2010-08-15 LAB — HEPATITIS PANEL, ACUTE: Hep B C IgM: NEGATIVE

## 2010-08-15 LAB — LACTIC ACID, PLASMA: Lactic Acid, Venous: 1.1 mmol/L (ref 0.5–2.2)

## 2010-08-16 ENCOUNTER — Telehealth (INDEPENDENT_AMBULATORY_CARE_PROVIDER_SITE_OTHER): Payer: Self-pay | Admitting: Nurse Practitioner

## 2010-08-21 NOTE — Progress Notes (Signed)
Summary: Tramadol and ultram  Phone Note Refill Request Call back at [phone]   Refills Requested: Medication #1:  ALPRAZOLAM 1 MG TABS One tablet by mouth daily as needed for anxiety Needs refill of this and tramadol - says she is "aching."  Initial call taken by: Dutch Quint RN,  August 16, 2010 11:12 AM Caller: Patient  Follow-up for Phone Call        will send alprazolam today - tramadol sent earlier this week advise to check with pharmacy and see if it is there Follow-up by: Lehman Prom FNP,  August 16, 2010 12:10 PM  Additional Follow-up for Phone Call Additional follow up Details #1::        Pt. notified.  Dutch Quint RN  August 16, 2010 12:24 PM     Prescriptions: ALPRAZOLAM 1 MG TABS (ALPRAZOLAM) One tablet by mouth daily as needed for anxiety  #30 x 0   Entered and Authorized by:   Lehman Prom FNP   Signed by:   Lehman Prom FNP on 08/16/2010   Method used:   Printed then faxed to ...       [facility] [phone]* (retail)       [address]       [place], [place]  [address]       Ph: [phone] or [phone]       Fax: [phone]   RxID:   [id number]

## 2010-08-22 LAB — CREATININE, SERUM
GFR calc Af Amer: >60 mL/min (ref >60)
GFR calc non Af Amer: >60 mL/min (ref >60)

## 2010-08-22 LAB — BUN: BUN: 7 mg/dL (ref 6–23)

## 2010-09-09 LAB — URINALYSIS, ROUTINE W REFLEX MICROSCOPIC
Bilirubin Urine: NEGATIVE
Ketones, ur: NEGATIVE mg/dL
Nitrite: NEGATIVE
pH: 7.5 (ref 5.0–8.0)

## 2010-09-09 LAB — URINE MICROSCOPIC-ADD ON

## 2010-09-09 LAB — WET PREP, GENITAL: Yeast Wet Prep HPF POC: NONE SEEN

## 2010-09-09 LAB — GC/CHLAMYDIA PROBE AMP, GENITAL: GC Probe Amp, Genital: NEGATIVE

## 2010-10-19 NOTE — H&P (Signed)
[facility]  Patient:    Ashley Riddle, Ashley Riddle                    MRN: [id number] Adm. Date:  [id number] Disc. Date: [id number] Attending:  Pola Corn Dictator:   Lyla Son. Achilles Dunk.                         History and Physical  CHIEF COMPLAINT:  Cough, body aches and weakness.  HISTORY OF PRESENT ILLNESS:  This is a 65 year old well-developed, well-nourished African-American female who presents to the emergency department of the [facility] initially with complaint of cough and fever.  She stated that this started on July 12, 2000 and complains of productive cough with yellow phlegm, difficulty with her breathing, some wheezing present, nasal congestion and high fevers.  States that she has problems with increasing weakness.  Denies any hemoptysis.  Complains of poor appetite and ______ body aches.  There have been no tick bites, no unusual rash, no unusual neck stiffness or related problems.  She has had similar symptoms in the past.  REVIEW OF SYSTEMS:  HEENT:  Patient wears glasses, also wears upper and lower dentures plates.  PULMONARY:  Noncontributory.  CARDIAC:  Noncontributory. GASTROINTESTINAL:  Noncontributory.  GENITOURINARY:  Noncontributory. MUSCULOSKELETAL:  Patient complains of arthritis of multiple sites. NEUROLOGICAL:  Noncontributory.  PAST MEDICAL HISTORY:  Positive for hepatitis C.  Positive for asthma.  SURGICAL HISTORY:  Patient has had back surgery, partial hysterectomy and C-sections x 3, also right ear surgery.  ALLERGIES:  Patient is allergic to PENICILLIN and CODEINE.  CURRENT MEDICATIONS:  Premarin daily, One-A-Day vitamins, Ultram one b.i.d. p.r.n.  Patient has recently been using Tylenol Cold formula.  SOCIAL HISTORY:   The patient is a smoker.  She denies the use of alcohol as a beverage or recreational drugs.  She is currently employed with [employer] and has support system through her  family at home.  PHYSICAL EXAMINATION:  VITAL SIGNS:  Temperature 102.4, pulse rate 103, respirations 18, blood pressure 147/86.  CONSTITUTIONAL:  Patient is awake and alert, cooperative.  PSYCHIATRIC:  Oriented to person, place, time and situation.  GENERAL:  Patient is an obese female in moderate distress.  HEENT:  Head:  Normocephalic and atraumatic.  Eyes:  The pupils are reactive to light.  The extraocular movements are intact.  The lids are symmetrical. The disks are sharp and flat bilaterally.  Ears:  The external auditory canals are clear.  The tympanic membranes are without problem.  The patient has had previous ear surgery.  Nose:  Congested.  No apparent tenderness with percussion over the sinuses area.  Throat:  The oropharynx is clear, the airway is patent and the speech is understandable.  NECK:  Supple with good range of motion.  There is no jugular venous distention.  No cervical lymphadenopathy.  There is full range of motion of the neck without any rigidity whatsoever.  LUNGS:  Rhonchi present with expiratory wheezes noted.  Respiratory rate 20. Rub appreciated.  HEART:  Regular rate and rhythm.  There is no rub or gallop appreciated.  No thrill noted.  Normal S1 and S2.  There is a tachycardia present of 103 per minute.  ABDOMEN:  Soft with good bowel sounds.  There is no mass appreciated.  EXTREMITIES:  Warm with good color.  No cyanosis.  No edema.  Good range of motion of upper and lower  extremities.  NEUROLOGIC:  Cranial nerves are intact.  Coordination is intact.  Patient is oriented as noted above to person, place, time and situation.  Cranial nerves II-VII and IX and XII are intact to gross testing and observation.  LABORATORY FINDINGS:  Pulse oximetry in the emergency department 97% on room air.  Admission CBC reveals a WBC of 4.5, an RBC of 4.15, a hemoglobin of 12.2, hematocrit of 35.6, an MCV of 86, platelet count of 133,000,  slightly low.  Chest x-ray shows a 1-cm nodular opacity overlying the right lateral mid-lung.  There is peribronchial thickening and evidence of focal airspace disease per the radiologist.  ASSESSMENT: 1. Pneumonia. 2. Hepatitis C.  PLAN:  Patient is quite ill-appearing.  Pulmonary toilet will be carried out. Patient will be placed in the hospital for hydration and aggressive treatment of this pulmonary illness at this time. DD:  09/23/00 TD:  09/24/00 Job: [id number]

## 2010-10-19 NOTE — Discharge Summary (Signed)
[facility]  Patient:    Ashley Riddle, Ashley Riddle                    MRN: [id number] Adm. Date:  [id number] Disc. Date: [id number] Attending:  Pola Corn                           Discharge Summary  ADMITTING DIAGNOSES: 1. Pneumonia. 2. Hepatitis C. 3. Weakness. 4. Fever.  DISCHARGE DIAGNOSES: 1. Acute bronchitis. 2. Chronic hepatitis. 3. Influenza with respiratory manifestations. 4. Non-insulin-dependent diabetes mellitus. 5. Tobacco use disorder.  HISTORY OF PRESENT ILLNESS:  This 65 year old, African-American woman became acutely ill one day prior to admission on July 14, 2000.  The patient developed sudden onset of fever, chills, body aches and fever to 101 degrees. The patient had not received a flu shot.  She reports she has smoked a pack of cigarettes every other day.  She has a history of asthmatic bronchitis and presented initially with a dry cough.  She also is noted to have a very high fever and presented to the emergency room.  After evaluation in the emergency room, she was subsequently admitted to the hospital to further evaluate and treat her high fever and other complaints.  PAST MEDICAL HISTORY: 1. Asthmatic bronchitis. 2. History of chronic sinus disease. 3. History of hepatitis C.  LABORATORY DATA AND X-RAY FINDINGS:  On July 14, 2000, white blood cell count 4500, hemoglobin 12.2, hematocrit 35.6.  At discharge, white blood cell count 8300, hemoglobin 13.2, hematocrit 39.5.  Serum sodium on admission 134, at discharge 137; potassium on admission 3.5, 3.6 at discharge; glucose 105 on admission, reached a high of 249 on July 17, 2000, and decreased to 119 on July 19, 2000.  ALT 102.  Glycosylated hemoglobin 5.9.  HIV was performed on July 14, 2000, and was nonreactive.  The patient had blood cultures on July 15, 2000, and were unremarkable.  Radiologic studies showed a 1 cm nodular opacity overlying the  right middle lung.  CT scan was recommended.  The patient also had mild cardiomegaly and peribronchial thickening without evidence of focal air space disease.  The patient had CT scan of the chest and did not reveal any severe problems, but did reveal a calcified granuloma in the right upper lung zone with a calcified hilar and mediastinal lymph nodes due to prior regular overdose exposure.  HOSPITAL COURSE:  This 65 year old, African-American woman was admitted to the hospital and started on intravenous fluids of D-5-1/2 normal saline at 100 cc per hour.  She was started also on albuterol inhaler every six hours.  She was administered Advil as well as Tequin 400 mg IV daily.  In addition to this, she was given Zithromax as exact etiology of her lung process was not clear.  She was also prescribed nicotine patch and given Darvocet for pain.  The patients condition did not improve rapidly.  She continued to have fever and the next day, her intravenous fluids were increased.  She was prescribed something for cough.  She seemed to have severe body aches and was given Ultram as well as Percocet in the subsequent days due to continued problems. She was also given Solu-Medrol 40 mg IV every eight hours and a subsequent consult to Dr. Delford Field was obtained.  The patients medications were adjusted to give her Tequin 40 mg IV daily, Solu-Medrol 80 mg IV every six hours as well as an  increase in her albuterol.  She was started on Humibid as well. She was given Tamiflu 75 mg p.o. b.i.d. and with the initiation of the Tamiflu, the patients condition seemed to improve.  On subsequent days, Dr. Delford Field discontinued her Zithromax, continued her Tequin and started her on Protonix.  The patient, unfortunately on the steroids, developed steroid-induced diabetes and this was addressed as well with sliding scale insulin coverage.  Slowly, she began to improve.  Her cough became more productive and her  fever eventually dissipated.  On July 21, 2000, she was discharged home and was scheduled for outpatient diabetic teaching as well as further office followup in about one week.  DISCHARGE MEDICATIONS: 1. Prednisone dose pack for approximately one week. 2. Tequin 400 mg daily for seven days. 3. Humibid LA 600 mg by mouth twice daily. 4. Ventolin inhaler two puffs every six hours. 5. Tamiflu 75 mg p.o. b.i.d. x 3 days. 6. Glucophage 500 mg p.o. daily.  SPECIAL INSTRUCTIONS:  She was restricted from work.  She instructed not to have any concentrated sweets.  Arrangements were made for diabetic teaching for glucose intolerance.  CONDITION ON DISCHARGE:  Overall condition was improved at the time of discharge.  FOLLOWUP:  She will be followed back in my office in approximately one to two weeks.  In summary, she appears to have bronchitis and influenza.  She responded to treatment and released home. DD:  08/13/00 TD:  08/14/00 Job: [id number]

## 2010-10-19 NOTE — Consult Note (Signed)
[facility]  Patient:    Ashley Riddle, Ashley Riddle                    MRN: [id number] Proc. Date: 07/16/00 Adm. Date:  [id number] Attending:  Pola Corn CC:         Osvaldo Shipper. Spruill, M.D.   Consultation Report  CHIEF COMPLAINT:  Cough, evaluate pneumonia.  HISTORY OF PRESENT ILLNESS:  This is a 65 year old African-American female who has had an illness that began one day prior to admission on July 14, 2000, and the patient had acute onset of fever, chills, body aches, fever up to 101, increasing cough, ______, weakness.  The patient has not received a flu shot. She smokes a pack of cigarettes every other day.  History of asthmatic bronchitis in the past.  Denies hemoptysis.  Is not coughing up much mucous, and has a dry cough essentially.  Denies chest pain, has poor appetite, overall weakness, and body aches.  Based on this, she was admitted on July 14, 2000.  Chest x-ray does not show pneumonia, but we were asked to see the patient for pneumonia.  PAST MEDICAL HISTORY: 1. History of asthmatic bronchitis, on Proventil inhaler p.r.n. 2. History of chronic sinus disease. 3. Hepatitis C, followed by [facility] for this.  No history of diabetes, hypertension, or heart disease.  PAST SURGICAL HISTORY: 1. Three cesarean sections. 2. Back surgery. 3. Three ear surgeries. 4. Partial hysterectomy. 5. Foreign body aspiration as a young person. 6. Has had an abnormal chest x-ray in the past with documented nodule in the    past.  SOCIAL HISTORY:  Works as a Merchandiser, retail at [employer].  Smokes one pack every other day.  ALLERGIES:  CODEINE, PENICILLIN.  CURRENT MEDICATIONS: 1. Albuterol nebulizer q.6h. 2. Nicotine patch. 3. Prednisolone 40 mg q.8h. 4. Tequin 400 mg daily. 5. Zithromax 500 mg daily. 6. Ibuprofen p.r.n.  PHYSICAL EXAMINATION:  VITAL SIGNS:  Temperature max 98, blood pressure 120/74, pulse 73, respirations 20, saturation 99%  on 2 L.  GENERAL:  This is an ill-appearing African-American female in no acute distress.  CHEST:  Bilateral rhonchi and expiratory wheeze.  Coarse air movements.  CARDIAC:  Regular rate and rhythm with S3 and S4, normal S1 and S2.  ABDOMEN:  Soft, nontender, bowel sounds active.  EXTREMITIES:  No clubbing, edema, or venous disease.  NEUROLOGIC:  Grossly intact.  HEENT:  No jugular venous distension, lymphadenopathy.  Oropharynx clear.  NECK:  Supple.  LABORATORY DATA:  Blood cultures no growth for two days.  White count 4.5, hemoglobin 12, platelet count 133,000.  Sodium 134, potassium 3.5, creatinine 0.7.  Liver function tests shows AST 92, ALT 102, alkaline phosphatase 66, total bilirubin 0.8.  HIV is nonreactive.  Chest x-ray and CT scan of the chest are reviewed, and show calcified granuloma of the right upper lobe with calcified hilar and mediastinal nodes due to previous granulomatous exposure. No evidence of active infiltrate.  IMPRESSION: 1. Asthmatic bronchitic flare with febrile tracheal bronchitis. 2. Probable acute influenza as complicating #1. 3. Doubt pneumonia with negative chest x-ray. 4. Benign calcified right middle lobe granuloma, does not require further    workup.  RECOMMENDATIONS: 1. Increased IV Solu-Medrol to 80 mg q.6h. 2. Begin Humibid LA 2 b.i.d. 3. Give Tamaflu 75 mg b.i.d. for 5 day course. 4. Change it to IV Tequin alone and discontinue Zithromax. 5. Increase nebulizer treatments to q.4h. 6. Titrate oxygen. 7. Administer flutter valve. DD:  07/16/00 TD:  07/17/00 Job: [id number]

## 2010-12-27 ENCOUNTER — Inpatient Hospital Stay (INDEPENDENT_AMBULATORY_CARE_PROVIDER_SITE_OTHER)
Admission: RE | Admit: 2010-12-27 | Discharge: 2010-12-27 | Disposition: A | Discharge status: Home / Self Care | Payer: Self-pay | Source: Ambulatory Visit | Attending: Family Medicine | Admitting: Family Medicine

## 2010-12-27 ENCOUNTER — Ambulatory Visit (INDEPENDENT_AMBULATORY_CARE_PROVIDER_SITE_OTHER): Payer: Self-pay

## 2010-12-27 DIAGNOSIS — M129 Arthropathy, unspecified: Secondary | ICD-10-CM

## 2010-12-27 DIAGNOSIS — M79609 Pain in unspecified limb: Secondary | ICD-10-CM

## 2011-01-14 ENCOUNTER — Other Ambulatory Visit (HOSPITAL_COMMUNITY): Payer: Self-pay | Admitting: Family Medicine

## 2011-01-14 DIAGNOSIS — Z1231 Encounter for screening mammogram for malignant neoplasm of breast: Secondary | ICD-10-CM

## 2011-01-17 ENCOUNTER — Emergency Department (HOSPITAL_COMMUNITY)
Admission: EM | Admit: 2011-01-17 | Discharge: 2011-01-17 | Disposition: A | Discharge status: Home / Self Care | Payer: Self-pay | Attending: Emergency Medicine | Admitting: Emergency Medicine

## 2011-01-17 ENCOUNTER — Emergency Department (HOSPITAL_COMMUNITY): Payer: Self-pay

## 2011-01-17 DIAGNOSIS — I10 Essential (primary) hypertension: Secondary | ICD-10-CM | POA: Insufficient documentation

## 2011-01-17 DIAGNOSIS — M1990 Unspecified osteoarthritis, unspecified site: Secondary | ICD-10-CM | POA: Insufficient documentation

## 2011-01-17 DIAGNOSIS — Z8619 Personal history of other infectious and parasitic diseases: Secondary | ICD-10-CM | POA: Insufficient documentation

## 2011-01-17 DIAGNOSIS — M25569 Pain in unspecified knee: Secondary | ICD-10-CM | POA: Insufficient documentation

## 2011-02-15 ENCOUNTER — Ambulatory Visit (HOSPITAL_COMMUNITY)
Admission: RE | Admit: 2011-02-15 | Discharge: 2011-02-15 | Disposition: A | Discharge status: Home / Self Care | Payer: Self-pay | Source: Ambulatory Visit | Attending: Family Medicine | Admitting: Family Medicine

## 2011-02-15 DIAGNOSIS — Z1231 Encounter for screening mammogram for malignant neoplasm of breast: Secondary | ICD-10-CM | POA: Insufficient documentation

## 2011-05-16 ENCOUNTER — Ambulatory Visit (INDEPENDENT_AMBULATORY_CARE_PROVIDER_SITE_OTHER): Payer: Self-pay | Admitting: Gastroenterology

## 2011-05-16 DIAGNOSIS — B182 Chronic viral hepatitis C: Secondary | ICD-10-CM

## 2011-05-16 DIAGNOSIS — K7460 Unspecified cirrhosis of liver: Secondary | ICD-10-CM

## 2011-05-23 NOTE — Progress Notes (Signed)
NAMEHELENE, Ashley Riddle    MR#:  [id number]      DATE:  05/16/2011  DOB:  1946-01-22    cc: [facility] [address] [place], [place] [address] Phone number:  [phone]    REASON FOR VISIT:  Followup of genotype 1 HCV cirrhosis.   History:  The patient returns, today, accompanied by her nephew. It will be recalled that she is a 65 year old woman, who we have followed for a number of years. She was treated with a combination of interferon TIW  and ribavirin, as part of her study protocol. Her baseline HCV RNA was 750,000 international units per mL. Her week 12 viral load was 55,000 international units per mL, so that she was considered a null  responder, back in 1998, when I saw her in [place]. She has not been considered for further treatment; but, on last followup, on 11/02/2009, there was some discussion of adding protease inhibitor,  pegylated interferon, and ribavirin. Because of significant side effects on therapy, she did not want to be retreated.  Today, she reports that she still wants to be monitored, but does not want to be treated. There are no symptoms referable to her history of  hepatitis C or symptoms to suggest that she has decompensated or has cryoglobulin mediated disease.   PAST MEDICAL HISTORY:  Significant for hypertension and back pain.    Current medications:  Lisinopril 40 mg daily, amlodipine 5 mg daily, MiraLAX, lactulose 15-30 mL daily for constipation, multivitamin daily, another type of multivitamin daily, alprazolam 1 mg daily p.r.n.    Allergies:  Denies.    Habits:  Smoking, denies. Alcohol, denies.   REVIEW OF SYSTEMS:  All 10 systems reviewed, today, with the patient, and are negative, other than which is mentioned above. CES-D was 25.   PHYSICAL EXAMINATION:  Constitutional: Well appearing without significant peripheral wasting. Vital signs: Height 62 inches, weight 173 pounds. Blood pressure 155/92, pulse 68, temperature 97.8  Fahrenheit. Ears, nose, mouth and  throat:  Unremarkable oropharynx.  No thyromegaly or neck masses.  Chest:  Resonant to percussion.  Clear to auscultation.  Cardiovascular:  Heart sounds normal S1, S2 without murmurs or rubs.   There is no peripheral edema.  Abdominal:  Normal bowel sounds.  No masses or tenderness.  I could not appreciate a liver edge or spleen tip.  I could not appreciate any hernias.  Lymphatics:  No cervical or  inguinal lymphadenopathy.  Central Nervous System:  No asterixis or focal neurologic findings.  Dermatologic:  Anicteric without palmar erythema or spider angiomata.  Eyes:  Anicteric sclerae.  Pupils are  equal and reactive to light.  No recent labs, since 03/10/2010.    No recent imaging, since an ultrasound, on 03/04/2010, suggesting cirrhosis. Last MRI was on 07/14/2010, also suggesting changes of cirrhosis.   Assessment:  The patient is a 65 year old, with genotype 1 hepatitis C, and radiographic imaging to suggest cirrhosis, who previously was a null responder to a course of interferon TIW and ribavirin. She does not  want to be considered for retreatment. Considering that she is a null responder to previous interferon therapy and has cirrhosis, her response rate would be far less than the advertised rates for protease  inhibitor-based triple therapies, so one could not fault her for this decision.  In terms of her cirrhosis care, there is no history of variceal bleeding, but she has not been screened for varices, as far as I know. I would  like to check a CBC, to see her platelet count, to see if she  needs this. There is no ascites or encephalopathy to treat. She has not been screened for [facility] since 2011.  This needs to be updated. She has previously received hepatitis A vaccination, and has  also reportedly been vaccinated for hepatitis B in the past, as well. According to the notes, she should have received Pneumovax and flu shot as a cirrhotic.  In my  discussion, today, with the patient and her nephew, we discussed her treatment options of triple therapy with protease inhibitor, PEG interferon, and ribavirin. I discussed the response rates, tempered by  the fact she is cirrhotic and a previous null responder to interferon and ribavirin. We discussed the specific system, constitutional, and psychiatric side effects of therapy, as well. The patient is not  interested in being retreated.  She does, however, want to be monitored for her liver condition.   Plan:  1. Reportedly, hepatitis B vaccinated, and I have documentation of her hepatitis A vaccination in our [facility] chart. 2. She should receive flu shot and Pneumovax, if not already done. 3. I will check a CBC, to see if her platelet count warrants an endoscopy, to screen for varices.  4. Will order an ultrasound to update imaging of her liver.  5. Will order standard labs and measure MELD score. 6. She is to return in 6 months' time.            Brooke Dare, MD   ADDENDUM: No record of labs being done.  403 .[id number]  D:  Thu Dec 13 20:44:47 2012 ; T:  Fri Dec 14 20:01:05 2012  Job #:  [id number]

## 2011-10-30 ENCOUNTER — Ambulatory Visit: Payer: Self-pay | Attending: Internal Medicine | Admitting: Rehabilitative and Restorative Service Providers"

## 2011-10-30 DIAGNOSIS — M6281 Muscle weakness (generalized): Secondary | ICD-10-CM | POA: Insufficient documentation

## 2011-10-30 DIAGNOSIS — IMO0001 Reserved for inherently not codable concepts without codable children: Secondary | ICD-10-CM | POA: Insufficient documentation

## 2011-10-30 DIAGNOSIS — M25569 Pain in unspecified knee: Secondary | ICD-10-CM | POA: Insufficient documentation

## 2011-11-02 ENCOUNTER — Ambulatory Visit: Payer: Self-pay | Admitting: Emergency Medicine

## 2011-11-02 VITALS — BP 104/73 | HR 80 | Temp 98.4°F | Resp 16 | Ht 62.0 in | Wt 172.0 lb

## 2011-11-02 DIAGNOSIS — M25569 Pain in unspecified knee: Secondary | ICD-10-CM

## 2011-11-02 DIAGNOSIS — F419 Anxiety disorder, unspecified: Secondary | ICD-10-CM

## 2011-11-02 MED ORDER — ALPRAZOLAM 0.25 MG PO TABS
0.2500 mg | ORAL_TABLET | Freq: Once | ORAL | Status: AC
  Administered 2011-11-02: 0.25 mg via ORAL

## 2011-11-02 MED ORDER — OXYCODONE HCL 5 MG PO TABS: 5.0000 mg | ORAL_TABLET | ORAL | Status: AC | PRN

## 2011-11-02 NOTE — Progress Notes (Signed)
  Subjective:    Patient ID: Ashley Riddle, female    DOB: [birth year], 66 y.o.   MRN: [id number]  HPI patient presents with severe left knee pain. She states she went to therapy and they did manipulations of her knee and since that time she has had excruciating pain in her knee. She has a history of arthritis in the knee she has had her knee injected before x-rays were done of her knee in the summer of 2012 and showed only mild arthritic changes. She has been to [facility] orthopedics in the past . She has a history of hepatitis C and is followed by Dr. Scot Jun    Review of Systems     Objective:   Physical Exam patient is sitting in a wheelchair crying. She is complaining of excruciating pain in the knee. Visual examination of the knees reveals no swelling. There is diffuse tenderness over the knee itself. There is exquisite pain with any flexion extension        Assessment & Plan:  Patient here with nondramatic excruciating left knee pain. We'll treat with pain medication and discuss with her possible referral back to orthopedics for evaluation. We'll give Xanax 0.25 one by mouth now. Prescription written for oxycodone. Advise the patient that she needs to followup with the orthopedist. Patient states she is not allergic to codeine. There are no pain medication she is allergic too dry knee immobilizer hinged brace and see if this helps.Marland Kitchen

## 2011-11-05 ENCOUNTER — Ambulatory Visit: Payer: Self-pay | Attending: Internal Medicine | Admitting: Physical Therapy

## 2011-11-05 DIAGNOSIS — M25569 Pain in unspecified knee: Secondary | ICD-10-CM | POA: Insufficient documentation

## 2011-11-05 DIAGNOSIS — IMO0001 Reserved for inherently not codable concepts without codable children: Secondary | ICD-10-CM | POA: Insufficient documentation

## 2011-11-05 DIAGNOSIS — M6281 Muscle weakness (generalized): Secondary | ICD-10-CM | POA: Insufficient documentation

## 2011-11-06 ENCOUNTER — Ambulatory Visit: Payer: Self-pay | Admitting: Physical Therapy

## 2011-11-07 ENCOUNTER — Ambulatory Visit (INDEPENDENT_AMBULATORY_CARE_PROVIDER_SITE_OTHER): Payer: Self-pay | Admitting: Gastroenterology

## 2011-11-07 DIAGNOSIS — B182 Chronic viral hepatitis C: Secondary | ICD-10-CM

## 2011-11-07 DIAGNOSIS — K7460 Unspecified cirrhosis of liver: Secondary | ICD-10-CM

## 2011-11-07 LAB — HEPATIC FUNCTION PANEL
Alkaline Phosphatase: 104 U/L (ref 39–117)
Indirect Bilirubin: 0.3 mg/dL (ref 0.0–0.9)
Total Bilirubin: 0.5 mg/dL (ref 0.3–1.2)

## 2011-11-07 LAB — CREATININE, SERUM: Creat: 0.59 mg/dL (ref 0.50–1.10)

## 2011-11-07 LAB — PROTIME-INR: INR: 1.12 (ref <1.50)

## 2011-11-07 NOTE — Patient Instructions (Signed)
Look at [email or web address].com for new information on Hepatitis C.

## 2011-11-08 LAB — AFP TUMOR MARKER: AFP-Tumor Marker: 26 ng/mL — ABNORMAL HIGH (ref 0.0–8.0)

## 2011-11-11 ENCOUNTER — Ambulatory Visit: Payer: Self-pay | Admitting: Physical Therapy

## 2011-11-14 ENCOUNTER — Encounter: Payer: Self-pay | Admitting: Rehabilitative and Restorative Service Providers"

## 2011-11-14 ENCOUNTER — Encounter: Payer: Self-pay | Admitting: Gastroenterology

## 2011-11-14 ENCOUNTER — Encounter: Payer: Self-pay | Admitting: Physical Therapy

## 2011-11-14 NOTE — Progress Notes (Signed)
NAME:  Ashley Riddle, Ashley Riddle  MR#:  [id number]      DATE:  11/07/2011  DOB:  1946-06-01    cc: Referring Physician: [address], [address], [place], [place] [address], Phone [phone]    REASON FOR VISIT:  Follow up of genotype 1 HCV cirrhosis.   HISTORY:  The patient returns today unaccompanied. It will be recalled that she is a 66 year old woman who has been followed in our clinic for a number of years. She was treated with a combination of interferon three times a week and ribavirin as part of a study protocol. Her baseline HCV RNA was 750,000 units per mL. At week 12, her viral load was 55,000 international units per mL. She was considered a null responder in 1998. She has declined the opportunity to be retreated with a protease inhibitor in combination with PEG interferon and ribavirin. When last seen on 05/16/2011, she maintained that she did not want to be retreated mostly because of concerns about side effects of interferon.   Today, she has no symptoms referable to her history of hepatitis C. There are symptoms to suggest decompensated or cryoglobulin mediated disease.   PAST MEDICAL HISTORY:  Significant for left knee pain as documented in EPIC.   CURRENT MEDICATIONS:  1. Lisinopril 40 mg daily.  2. Amlodipine 5 mg daily.  3. MiraLAX, lactulose 15-30 mL daily for constipation.  4. Multivitamin. 5. Vicodin for her left knee pain.  6. She is no longer taking Alprazolam.   ALLERGIES:  Denies.   HABITS:  Denies smoking, having quit over 3 years ago. Alcohol denies interval consumption.   REVIEW OF SYSTEMS:  All 10 systems reviewed today with the patient and they are negative other than which is mentioned above. She did not complete the CES-D.   PHYSICAL EXAMINATION:  Constitutional: Well appearing without significant peripheral wasting. Vital signs: Height 62 inches, weight 178 pounds up 5 pounds from previously, blood pressure 133/77, pulse of 84, temperature  97.7 Fahrenheit. Ears, Nose, Mouth and Throat:  Unremarkable oropharynx.  No thyromegaly or neck masses.  Chest:  Resonant to percussion.  Clear to auscultation.  Cardiovascular:  Heart sounds normal S1, S2 without murmurs or rubs.  There is no peripheral edema.  Abdomen:  Normal bowel sounds.  No masses or tenderness.  I could not appreciate a liver edge or spleen tip.  I could not appreciate any hernias.  Lymphatics:  No cervical or inguinal lymphadenopathy.  Central Nervous System:  No asterixis or focal neurologic findings.  Dermatologic:  Anicteric without palmar erythema or spider angiomata.  Eyes:  Anicteric sclerae.  Pupils are equal and reactive to light.  laboratories:  It will be recalled on 05/16/2011, I ordered labs, which were never done. In addition, I ordered an ultrasound at that appointment, which was never done either. When asked today, the patient reports that there were no orders for the labs, and when she contacted our office, she was told that I did not put them in to [facility]. This is clearly not the case and I pointed out today that there are standing orders that are still open from her 05/15/2011 visit, but the ultrasound and the labs were never done.   ASSESSMENT:  The patient is a 66 year old woman with a history genotype 1 hepatitis C and radiographic imaging to suggest cirrhosis. She was previously a null responder to a course of interferon three times a week and ribavirin. She does not want to be retreated. As  previously mentioned, it is true that her response rate to a course of triple therapy with a protease inhibitor would be far less than the expected for naive patient who does not have cirrhosis. The response rate for sofosbuvir in combination with pegylated interferon, ribavirin, may also be significantly less than that of a naive patient; however, there are oral combination therapies that may be of some value in the future, which also use sofosbuvir. None of these are  available yet.   In terms of her cirrhosis care, there is no history of variceal bleeding. She has not been screened for varices either. I wanted to check a CBC previously to see what her platelet count was to see if she needed an endoscopy, but this was not done, and I forgot to order this today in my orders. I can ask for her to have this done at a later date when sending her a note about her other lab results. There is no ascites or encephalopathy to treat. She is out of date for screening for hepatocellular cancer (HCC) screening. She has previously received hepatitis A vaccination and has also reportedly been vaccinated against hepatitis B, according to the notes.  She should have received Pneumovax and flu shot as a cirrhotic.   In my discussion today with the patient, we once again reviewed her decision to not be treated for hepatitis C. She again stated that she is not interested in therapy. We discussed the possibly of future noninterferon based therapies for which she may be interested in because she seems to be most concerned about the side effects of interferon.   PLAN: 1. Reportedly, hepatitis B vaccinated, as well as documentation of hepatitis A vaccination completed in [place].  2. Flu shot and Pneumovax through her primary physician, if not already done.  3. Standard labs today to calculate MELD score.  4. Ultrasound reordered.  5. Once results of the other testing are available, I can send her a  note with instructions to get a CBC.  6. I will see her again in 6 months' time.               Brooke Dare, MD  ADDENDUM  MELD 8   Wrote to her to get a CBC done to assess platelet count.  403 .[id number]  D:  Thu Jun 06 19:02:15 2013 ; T:  Thu Jun 06 21:30:35 2013  Job #:  [id number]

## 2011-11-18 ENCOUNTER — Ambulatory Visit: Payer: Self-pay | Admitting: Physical Therapy

## 2011-11-19 LAB — CBC WITH DIFFERENTIAL/PLATELET
Basophils Absolute: 0 10*3/uL (ref 0.0–0.1)
Basophils Relative: 0 % (ref 0–1)
Eosinophils Absolute: 0.1 10*3/uL (ref 0.0–0.7)
Eosinophils Relative: 2 % (ref 0–5)
HCT: 34.9 % — ABNORMAL LOW (ref 36.0–46.0)
Hemoglobin: 11.6 g/dL — ABNORMAL LOW (ref 12.0–15.0)
Lymphocytes Relative: 49 % — ABNORMAL HIGH (ref 12–46)
Lymphs Abs: 2.4 10*3/uL (ref 0.7–4.0)
MCH: 28.7 pg (ref 26.0–34.0)
MCHC: 33.2 g/dL (ref 30.0–36.0)
MCV: 86.4 fL (ref 78.0–100.0)
Monocytes Absolute: 0.5 10*3/uL (ref 0.1–1.0)
Monocytes Relative: 10 % (ref 3–12)
Neutro Abs: 1.9 10*3/uL (ref 1.7–7.7)
Neutrophils Relative %: 39 % — ABNORMAL LOW (ref 43–77)
Platelets: 155 10*3/uL (ref 150–400)
RBC: 4.04 MIL/uL (ref 3.87–5.11)
RDW: 13.8 % (ref 11.5–15.5)
WBC: 4.9 10*3/uL (ref 4.0–10.5)

## 2011-11-22 ENCOUNTER — Encounter: Payer: Self-pay | Admitting: Physical Therapy

## 2011-11-29 ENCOUNTER — Ambulatory Visit (HOSPITAL_COMMUNITY)
Admission: RE | Admit: 2011-11-29 | Discharge: 2011-11-29 | Disposition: A | Discharge status: Home / Self Care | Payer: Self-pay | Source: Ambulatory Visit | Attending: Gastroenterology | Admitting: Gastroenterology

## 2011-11-29 DIAGNOSIS — K7460 Unspecified cirrhosis of liver: Secondary | ICD-10-CM

## 2011-11-29 DIAGNOSIS — B182 Chronic viral hepatitis C: Secondary | ICD-10-CM

## 2011-12-02 ENCOUNTER — Ambulatory Visit: Payer: Self-pay | Attending: Physical Medicine and Rehabilitation | Admitting: Physical Therapy

## 2011-12-02 DIAGNOSIS — M25569 Pain in unspecified knee: Secondary | ICD-10-CM | POA: Insufficient documentation

## 2011-12-02 DIAGNOSIS — IMO0001 Reserved for inherently not codable concepts without codable children: Secondary | ICD-10-CM | POA: Insufficient documentation

## 2011-12-02 DIAGNOSIS — M6281 Muscle weakness (generalized): Secondary | ICD-10-CM | POA: Insufficient documentation

## 2011-12-04 ENCOUNTER — Ambulatory Visit: Payer: Self-pay | Admitting: Physical Therapy

## 2011-12-16 ENCOUNTER — Encounter: Payer: Self-pay | Admitting: Physical Therapy

## 2011-12-17 ENCOUNTER — Encounter: Payer: Self-pay | Admitting: Physical Therapy

## 2011-12-20 ENCOUNTER — Encounter: Payer: Self-pay | Admitting: Gastroenterology

## 2011-12-24 ENCOUNTER — Encounter: Payer: Self-pay | Admitting: Physical Therapy

## 2011-12-26 ENCOUNTER — Encounter: Payer: Self-pay | Admitting: Physical Therapy

## 2012-05-29 ENCOUNTER — Encounter (HOSPITAL_COMMUNITY): Payer: Self-pay

## 2012-05-29 ENCOUNTER — Emergency Department (HOSPITAL_COMMUNITY)
Admission: EM | Admit: 2012-05-29 | Discharge: 2012-05-29 | Disposition: A | Discharge status: Home / Self Care | Payer: No Typology Code available for payment source | Source: Home / Self Care

## 2012-05-29 DIAGNOSIS — I10 Essential (primary) hypertension: Secondary | ICD-10-CM

## 2012-05-29 DIAGNOSIS — B1710 Acute hepatitis C without hepatic coma: Secondary | ICD-10-CM

## 2012-05-29 MED ORDER — LACTULOSE 10 GM/15ML PO SOLN: 20.0000 g | Freq: Three times a day (TID) | ORAL | Status: DC

## 2012-05-29 MED ORDER — TRAMADOL HCL 50 MG PO TABS: 50.0000 mg | ORAL_TABLET | Freq: Four times a day (QID) | ORAL | Status: DC | PRN

## 2012-05-29 MED ORDER — LISINOPRIL 40 MG PO TABS: 40.0000 mg | ORAL_TABLET | Freq: Every day | ORAL | Status: DC

## 2012-05-29 MED ORDER — AMLODIPINE BESYLATE 5 MG PO TABS: 5.0000 mg | ORAL_TABLET | Freq: Every day | ORAL | Status: DC

## 2012-05-29 NOTE — ED Notes (Signed)
Patient states has history hypertension and hepatitis c-needs medication refill

## 2012-05-29 NOTE — ED Provider Notes (Signed)
History     CSN: [id number]  Arrival date & time 05/29/12  1651   First MD Initiated Contact with Patient 05/29/12 1744      Chief Complaint  Patient presents with  . Medication Refill    (Consider location/radiation/quality/duration/timing/severity/associated sxs/prior treatment) HPI Patient came to the clinic for medication refill, she has a history of hepatitis C and has been followed as outpatient by GI from [facility]  History reviewed. No pertinent past medical history.  History reviewed. No pertinent past surgical history.  No family history on file.  History  Substance Use Topics  . Smoking status: Former Smoker    Quit date: 11/01/2008  . Smokeless tobacco: Not on file  . Alcohol Use: Not on file    OB History    Grav Para Term Preterm Abortions TAB SAB Ect Mult Living                  Review of Systems Denies fever Denies chest pain shortness of breath Denies nausea vomiting diarrhea Allergies  Codeine and Penicillins  Home Medications   Current Outpatient Rx  Name  Route  Sig  Dispense  Refill  . AMLODIPINE BESYLATE 5 MG PO TABS   Oral   Take 1 tablet (5 mg total) by mouth daily.   30 tablet   3   . LACTULOSE 10 GM/15ML PO SOLN   Oral   Take 30 mLs (20 g total) by mouth 3 (three) times daily.   240 mL   2   . LISINOPRIL 40 MG PO TABS   Oral   Take 1 tablet (40 mg total) by mouth daily.   30 tablet   3   . TRAMADOL HCL 50 MG PO TABS   Oral   Take 1 tablet (50 mg total) by mouth every 6 (six) hours as needed for pain. 2 PO BID   30 tablet   0     BP 173/84  Pulse 77  Temp 97.8 F (36.6 C) (Oral)  Resp 20  SpO2 99%  Physical Exam Constitutional:   Patient is a well-developed and well-nourished female in no acute distress and cooperative with exam. Head: Normocephalic and atraumatic Mouth: Mucus membranes moist Eyes: PERRL, EOMI, conjunctivae normal Cardiovascular: RRR, S1 normal, S2 normal Pulmonary/Chest: CTAB, no  wheezes, rales, or rhonchi  ED Course  Procedures (including critical care time)  Labs Reviewed - No data to display No results found.   1. HYPERTENSION, BENIGN ESSENTIAL   2. HEPATITIS C       MDM   Hypertension Patient ran out of her medications her blood pressure today is 173/84 We will give refills for amlodipine and lisinopril  Hepatitis C Follow GI. as outpatient  Followup adult clinic in one month         Meredeth Ide, MD 05/29/12 1818

## 2013-03-30 ENCOUNTER — Ambulatory Visit (INDEPENDENT_AMBULATORY_CARE_PROVIDER_SITE_OTHER): Payer: Self-pay | Admitting: Family Medicine

## 2013-03-30 ENCOUNTER — Ambulatory Visit: Payer: Medicare Other

## 2013-03-30 VITALS — BP 132/74 | HR 84 | Temp 98.6°F | Resp 16 | Ht 62.0 in | Wt 170.0 lb

## 2013-03-30 DIAGNOSIS — R05 Cough: Secondary | ICD-10-CM

## 2013-03-30 DIAGNOSIS — R062 Wheezing: Secondary | ICD-10-CM

## 2013-03-30 DIAGNOSIS — B182 Chronic viral hepatitis C: Secondary | ICD-10-CM

## 2013-03-30 DIAGNOSIS — I700 Atherosclerosis of aorta: Secondary | ICD-10-CM

## 2013-03-30 DIAGNOSIS — R6889 Other general symptoms and signs: Secondary | ICD-10-CM

## 2013-03-30 DIAGNOSIS — R059 Cough, unspecified: Secondary | ICD-10-CM

## 2013-03-30 DIAGNOSIS — J209 Acute bronchitis, unspecified: Secondary | ICD-10-CM

## 2013-03-30 DIAGNOSIS — D72819 Decreased white blood cell count, unspecified: Secondary | ICD-10-CM

## 2013-03-30 LAB — POCT CBC
Granulocyte percent: 38.5 %G (ref 37–80)
HCT, POC: 36 % — AB (ref 37.7–47.9)
Hemoglobin: 11.1 g/dL — AB (ref 12.2–16.2)
Lymph, poc: 1.4 (ref 0.6–3.4)
MCH, POC: 28.1 pg (ref 27–31.2)
MCHC: 30.8 g/dL — AB (ref 31.8–35.4)
MCV: 91.1 fL (ref 80–97)
MID (cbc): 0.4 (ref 0–0.9)
MPV: 9.2 fL (ref 0–99.8)
POC Granulocyte: 1.1 — AB (ref 2–6.9)
POC LYMPH PERCENT: 49.2 % (ref 10–50)
POC MID %: 12.3 %M — AB (ref 0–12)
Platelet Count, POC: 113 10*3/uL — AB (ref 142–424)
RBC: 3.95 M/uL — AB (ref 4.04–5.48)
RDW, POC: 13 %
WBC: 2.9 10*3/uL — AB (ref 4.6–10.2)

## 2013-03-30 LAB — POCT INFLUENZA A/B
Influenza A, POC: NEGATIVE
Influenza B, POC: NEGATIVE

## 2013-03-30 MED ORDER — AZITHROMYCIN 250 MG PO TABS: ORAL_TABLET | ORAL | Status: DC

## 2013-03-30 MED ORDER — HYDROCODONE-HOMATROPINE 5-1.5 MG/5ML PO SYRP: 5.0000 mL | ORAL_SOLUTION | Freq: Three times a day (TID) | ORAL | Status: DC | PRN

## 2013-03-30 MED ORDER — OSELTAMIVIR PHOSPHATE 75 MG PO CAPS: 75.0000 mg | ORAL_CAPSULE | Freq: Two times a day (BID) | ORAL | Status: DC

## 2013-03-30 MED ORDER — ALBUTEROL SULFATE HFA 108 (90 BASE) MCG/ACT IN AERS: 2.0000 | INHALATION_SPRAY | Freq: Four times a day (QID) | RESPIRATORY_TRACT | Status: DC | PRN

## 2013-03-30 NOTE — Progress Notes (Addendum)
[facility]:  Office Visit  Chief Complaint:  Chief Complaint  Patient presents with  . Headache  . Generalized Body Aches    started lastnight   . Cough    productive x 2 days   . Wheezing    HPI: Ashley Riddle is a 67 y.o. female who is here for  1-2 day history of  cough, body aches, headache, chest congestion,weakness, wheezing.  Fevers and chills, Nonsmoker x 9 years.  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 She has elected not to be treated for her Hep C. From the last OV in EMR on 11/2011 there may be issues regarding noncompliance  Past Medical History  Diagnosis Date  . Arthritis   . Hypertension   . Hepatitis C    Past Surgical History  Procedure Laterality Date  . Abdominal hysterectomy    . Cesarean section    . Back surgery     History   Social History  . Marital Status: Single    Spouse Name: N/A    Number of Children: N/A  . Years of Education: N/A   Social History Main Topics  . Smoking status: Former Smoker    Quit date: 11/01/2008  . Smokeless tobacco: None  . Alcohol Use: None  . Drug Use: None  . Sexual Activity: None   Other Topics Concern  . None   Social History Narrative  . None   Family History  Problem Relation Age of Onset  . Heart disease Mother   . Kidney disease Mother    Allergies  Allergen Reactions  . Codeine   . Penicillins    Prior to Admission medications   Medication Sig Start Date End Date Taking? Authorizing Provider  amLODipine (NORVASC) 5 MG tablet Take 1 tablet (5 mg total) by mouth daily. 05/29/12  Yes Meredeth Ide, MD  lactulose (CHRONULAC) 10 GM/15ML solution Take 30 mLs (20 g total)  by mouth 3 (three) times daily. 05/29/12  Yes Meredeth Ide, MD  lisinopril (PRINIVIL,ZESTRIL) 40 MG tablet Take 1 tablet (40 mg total) by mouth daily. 05/29/12  Yes Meredeth Ide, MD  traMADol (ULTRAM) 50 MG tablet Take 1 tablet (50 mg total) by mouth every 6 (six) hours as needed for pain. 2 PO BID 05/29/12  Yes Meredeth Ide, MD     ROS: The patient deniesnight sweats, unintentional weight loss, chest pain, palpitations, , dyspnea on exertion, nausea, vomiting, abdominal pain, dysuria, hematuria, melena, numbness, , or tingling.   All other systems have been reviewed and were otherwise negative with the exception of those mentioned in the HPI and as above.    PHYSICAL EXAM: Filed Vitals:   03/30/13 1647  BP: 132/74  Pulse: 84  Temp: 98.6 F (37 C)  Resp: 16  Spo2 99% Filed Vitals:   03/30/13 1647  Height: 5\' 2"  (1.575 m)  Weight: 170 lb (77.111 kg)   Body mass index is 31.09 kg/(m^2).  General: Alert, tired appearing African female, still speaking in full sentences, no increase WOB HEENT:  Normocephalic, atraumatic, oropharynx patent. EOMI, PERRLA, no exudates, TM nl  Cardiovascular:  Regular rate and rhythm, no rubs murmurs or gallops.  No Carotid bruits, radial pulse intact. No pedal edema.  Respiratory: Clear to auscultation bilaterally.  +wheezes, No rales, or rhonchi.  No cyanosis, no use of accessory musculature GI: No organomegaly, abdomen is soft and non-tender, positive bowel sounds.  No masses. Skin: No rashes. Neurologic: Facial musculature symmetric. Psychiatric: Patient is appropriate throughout our interaction. Lymphatic: No cervical lymphadenopathy Musculoskeletal: Gait intact.   LABS: Results for orders placed in visit on 03/30/13  POCT CBC      Result Value Range   WBC 2.9 (*) 4.6 - 10.2 K/uL   Lymph, poc 1.4  0.6 - 3.4   POC LYMPH PERCENT 49.2  10 - 50 %L   MID (cbc) 0.4  0 - 0.9   POC MID % 12.3 (*) 0 - 12 %M   POC Granulocyte 1.1 (*) 2 - 6.9    Granulocyte percent 38.5  37 - 80 %G   RBC 3.95 (*) 4.04 - 5.48 M/uL   Hemoglobin 11.1 (*) 12.2 - 16.2 g/dL   HCT, POC 16.1 (*) 09.6 - 47.9 %   MCV 91.1  80 - 97 fL   MCH, POC 28.1  27 - 31.2 pg   MCHC 30.8 (*) 31.8 - 35.4 g/dL   RDW, POC 04.5     Platelet Count, POC 113 (*) 142 - 424 K/uL   MPV 9.2  0 - 99.8 fL  POCT INFLUENZA A/B      Result Value Range   Influenza A, POC Negative     Influenza B, POC Negative       EKG/XRAY:   Primary read interpreted by Dr. Conley Rolls at [facility]. Right lung solitary lung nodule/granuloma, stable + bronchitic changes, increase vascular markings + DJD of Thoracic spine   ASSESSMENT/PLAN: Encounter Diagnoses  Name Primary?  . Flu-like symptoms Yes  . Wheezing   . Acute bronchitis   . Cough   . Atherosclerosis of aorta   . Leukocytopenia, unspecified    Hepatitis C , Dr Jacqualine Mau , she has not seen them since over 1 year She states that she did not want to take anymore of the medicine Will refer back to Dr. Jacqualine Mau for Hep C I will treat her persumtively for flu  I will  Also give her azithromycin for acute bronchitis along with hycodan and albuterol inh  She was offered Medrol dose pack but does not want since she gets crazy , less constriction after neb treatment.  F/u on Thursday or Friday or go to ER prn  Gross sideeffects, risk and benefits, and alternatives of medications d/w patient. Patient is aware that all medications have potential sideeffects and we are unable to predict every sideeffect or drug-drug interaction that may occur.  LE, THAO PHUONG, DO 04/01/2013 9:00 AM

## 2013-04-01 ENCOUNTER — Ambulatory Visit (INDEPENDENT_AMBULATORY_CARE_PROVIDER_SITE_OTHER): Payer: Self-pay | Admitting: Family Medicine

## 2013-04-01 VITALS — BP 160/80 | HR 89 | Temp 98.5°F | Resp 16 | Ht 63.0 in | Wt 163.0 lb

## 2013-04-01 DIAGNOSIS — D61818 Other pancytopenia: Secondary | ICD-10-CM

## 2013-04-01 DIAGNOSIS — R05 Cough: Secondary | ICD-10-CM

## 2013-04-01 DIAGNOSIS — J209 Acute bronchitis, unspecified: Secondary | ICD-10-CM

## 2013-04-01 DIAGNOSIS — R059 Cough, unspecified: Secondary | ICD-10-CM

## 2013-04-01 DIAGNOSIS — R6889 Other general symptoms and signs: Secondary | ICD-10-CM

## 2013-04-01 MED ORDER — HYDROCODONE-HOMATROPINE 5-1.5 MG/5ML PO SYRP: 5.0000 mL | ORAL_SOLUTION | Freq: Three times a day (TID) | ORAL | Status: DC | PRN

## 2013-04-01 NOTE — Progress Notes (Signed)
 [facility]:  Office Visit  Chief Complaint:  Chief Complaint  Patient presents with  . Follow-up    labs    HPI: Ashley Riddle is a 67 y.o. female who is here for recheck for flu like sxs and bronchitis She has been on azithromycin and tamiflu and feels better She still has cough but she feels so much better Not using albuterol prn  She has Hep C not on treatment, awaiting Hep C clinic referral  Past Medical History  Diagnosis Date  . Arthritis   . Hypertension   . Hepatitis C    Past Surgical History  Procedure Laterality Date  . Abdominal hysterectomy    . Cesarean section    . Back surgery     History   Social History  . Marital Status: Single    Spouse Name: N/A    Number of Children: N/A  . Years of Education: N/A   Social History Main Topics  . Smoking status: Former Smoker    Quit date: 11/01/2008  . Smokeless tobacco: None  . Alcohol Use: None  . Drug Use: None  . Sexual Activity: None   Other Topics Concern  . None   Social History Narrative  . None   Family History  Problem Relation Age of Onset  . Heart disease Mother   . Kidney disease Mother    Allergies  Allergen Reactions  . Penicillins    Prior to Admission medications   Medication Sig Start Date End Date Taking? Authorizing Provider  albuterol (PROVENTIL HFA;VENTOLIN HFA) 108 (90 BASE) MCG/ACT inhaler Inhale 2 puffs into the lungs every 6 (six) hours as needed for wheezing. 03/30/13  Yes  P , DO  amLODipine (NORVASC) 5 MG tablet Take 1 tablet (5 mg total) by mouth daily. 05/29/12  Yes Meredeth Ide, MD  azithromycin (ZITHROMAX) 250 MG tablet Take 2 tabs po now then 1 tab po daily for 4 more days 03/30/13  Yes  P , DO  HYDROcodone-homatropine (HYCODAN) 5-1.5 MG/5ML syrup Take 5 mLs by mouth every 8 (eight) hours as needed for cough. 03/30/13  Yes  P , DO  lactulose (CHRONULAC) 10 GM/15ML solution Take 30 mLs (20 g total) by mouth 3 (three)  times daily. 05/29/12  Yes Meredeth Ide, MD  lisinopril (PRINIVIL,ZESTRIL) 40 MG tablet Take 1 tablet (40 mg total) by mouth daily. 05/29/12  Yes Meredeth Ide, MD  oseltamivir (TAMIFLU) 75 MG capsule Take 1 capsule (75 mg total) by mouth 2 (two) times daily. 03/30/13  Yes  P , DO  traMADol (ULTRAM) 50 MG tablet Take 1 tablet (50 mg total) by mouth every 6 (six) hours as needed for pain. 2 PO BID 05/29/12  Yes Meredeth Ide, MD     ROS: The patient denies fevers, chills, night sweats, unintentional weight loss, chest pain, palpitations, dyspnea on exertion, nausea, vomiting, abdominal pain, dysuria, hematuria, melena, numbness, weakness, or tingling.   All other systems have been reviewed and were otherwise negative with the exception of those mentioned in the HPI and as above.    PHYSICAL EXAM: Filed Vitals:   04/01/13 1943  BP: 160/80  Pulse: 89  Temp: 98.5 F (36.9 C)  Resp: 16   Filed Vitals:   04/01/13 1943  Height: 5\' 3"  (1.6 m)  Weight: 163 lb (73.936 kg)   Body mass index is 28.88 kg/(m^2).  General: Alert, no acute distress HEENT:  Normocephalic, atraumatic, oropharynx patent. EOMI,  PERRLA Cardiovascular:  Regular rate and rhythm, no rubs murmurs or gallops.  No Carotid bruits, radial pulse intact. No pedal edema.  Respiratory: Clear to auscultation bilaterally.  Minimal scattered wheezes much improved, No rales, or rhonchi.  No cyanosis, no use of accessory musculature GI: No organomegaly, abdomen is soft and non-tender, positive bowel sounds.  No masses. Skin: No rashes. Neurologic: Facial musculature symmetric. Psychiatric: Patient is appropriate throughout our interaction. Lymphatic: No cervical lymphadenopathy Musculoskeletal: Gait intact.   LABS: Results for orders placed in visit on 03/30/13  POCT CBC      Result Value Range   WBC 2.9 (*) 4.6 - 10.2 K/uL   Lymph, poc 1.4  0.6 - 3.4   POC LYMPH PERCENT 49.2  10 - 50 %L   MID (cbc) 0.4  0 - 0.9   POC  MID % 12.3 (*) 0 - 12 %M   POC Granulocyte 1.1 (*) 2 - 6.9   Granulocyte percent 38.5  37 - 80 %G   RBC 3.95 (*) 4.04 - 5.48 M/uL   Hemoglobin 11.1 (*) 12.2 - 16.2 g/dL   HCT, POC 40.9 (*) 81.1 - 47.9 %   MCV 91.1  80 - 97 fL   MCH, POC 28.1  27 - 31.2 pg   MCHC 30.8 (*) 31.8 - 35.4 g/dL   RDW, POC 91.4     Platelet Count, POC 113 (*) 142 - 424 K/uL   MPV 9.2  0 - 99.8 fL  POCT INFLUENZA A/B      Result Value Range   Influenza A, POC Negative     Influenza B, POC Negative       EKG/XRAY:   Primary read interpreted by Dr. Conley Rolls at [facility].   ASSESSMENT/PLAN: Encounter Diagnoses  Name Primary?  . Flu-like symptoms   . Acute bronchitis Yes  . Cough   . Pancytopenia    Much improved Refilled hydromet Use albuterol inhaler  As scheduled and not prn She does not like steroids, it makes her "crazy" F/u prn Gross sideeffects, risk and benefits, and alternatives of medications d/w patient. Patient is aware that all medications have potential sideeffects and we are unable to predict every sideeffect or drug-drug interaction that may occur.  ,  PHUONG, DO 04/01/2013 8:15 PM

## 2013-07-29 ENCOUNTER — Emergency Department (HOSPITAL_COMMUNITY): Payer: Medicare HMO

## 2013-07-29 ENCOUNTER — Emergency Department (HOSPITAL_COMMUNITY)
Admission: EM | Admit: 2013-07-29 | Discharge: 2013-07-29 | Disposition: A | Discharge status: Home / Self Care | Payer: Medicare HMO | Attending: Emergency Medicine | Admitting: Emergency Medicine

## 2013-07-29 ENCOUNTER — Encounter (HOSPITAL_COMMUNITY): Payer: Self-pay | Admitting: Emergency Medicine

## 2013-07-29 DIAGNOSIS — Y929 Unspecified place or not applicable: Secondary | ICD-10-CM | POA: Insufficient documentation

## 2013-07-29 DIAGNOSIS — Z87891 Personal history of nicotine dependence: Secondary | ICD-10-CM | POA: Insufficient documentation

## 2013-07-29 DIAGNOSIS — S139XXA Sprain of joints and ligaments of unspecified parts of neck, initial encounter: Secondary | ICD-10-CM | POA: Insufficient documentation

## 2013-07-29 DIAGNOSIS — S161XXA Strain of muscle, fascia and tendon at neck level, initial encounter: Secondary | ICD-10-CM

## 2013-07-29 DIAGNOSIS — Y939 Activity, unspecified: Secondary | ICD-10-CM | POA: Insufficient documentation

## 2013-07-29 DIAGNOSIS — Z79899 Other long term (current) drug therapy: Secondary | ICD-10-CM | POA: Insufficient documentation

## 2013-07-29 DIAGNOSIS — Z8739 Personal history of other diseases of the musculoskeletal system and connective tissue: Secondary | ICD-10-CM | POA: Insufficient documentation

## 2013-07-29 DIAGNOSIS — Z8619 Personal history of other infectious and parasitic diseases: Secondary | ICD-10-CM | POA: Insufficient documentation

## 2013-07-29 DIAGNOSIS — Z880 Allergy status to penicillin: Secondary | ICD-10-CM | POA: Insufficient documentation

## 2013-07-29 DIAGNOSIS — W010XXA Fall on same level from slipping, tripping and stumbling without subsequent striking against object, initial encounter: Secondary | ICD-10-CM | POA: Insufficient documentation

## 2013-07-29 DIAGNOSIS — I10 Essential (primary) hypertension: Secondary | ICD-10-CM | POA: Insufficient documentation

## 2013-07-29 MED ORDER — MORPHINE SULFATE 4 MG/ML IJ SOLN
4.0000 mg | Freq: Once | INTRAMUSCULAR | Status: AC
  Administered 2013-07-29: 4 mg via INTRAMUSCULAR
  Filled 2013-07-29: qty 1

## 2013-07-29 MED ORDER — OXYCODONE HCL 5 MG PO CAPS: 5.0000 mg | ORAL_CAPSULE | ORAL | Status: DC | PRN

## 2013-07-29 NOTE — ED Notes (Signed)
Pt states tripped over a play truck in bedroom, fell forward into dresser hitting mouth, states having neck pain, denies dizziness or blurred vision.

## 2013-07-29 NOTE — Discharge Instructions (Signed)
Take your pain medication as prescribed.  Take ibuprofen three times a day as well.  Apply a heating pad or ice pack to your neck and avoid activities that aggravate pain.  Follow up with your primary care doctor.  Please return to the ER if your pain worsens or you develop numbness/weakness of arms/legs or loss of control of bladder/bowels.

## 2013-07-30 ENCOUNTER — Other Ambulatory Visit: Payer: Self-pay | Admitting: *Deleted

## 2013-07-30 DIAGNOSIS — C220 Liver cell carcinoma: Secondary | ICD-10-CM

## 2013-07-30 NOTE — ED Provider Notes (Signed)
CSN: [id number]     Arrival date & time 07/29/13  1957 History   First MD Initiated Contact with Patient 07/29/13 2105     Chief Complaint  Patient presents with  . Fall  . Neck Pain     (Consider location/radiation/quality/duration/timing/severity/associated sxs/prior Treatment) HPI History provided by pt.   Pt tripped over a toy truck on floor while carrying her 39mo grandson this evening.  [place] forward, hitting her mouth on side of dresser and hyperextending her neck.  Presents w/ neck pain that radiates into bilateral shoulders and is aggravated by ROM.  Denies bladder/bowel dysfunction and extremity weakness/paresthesis.  Has a mild headache but denies dizziness, vision changes and N/V.  Has minimal jaw pain and no dental pain.  She is not anti-coagulated.  Did not have dizziness or lightheadedness prior to fall, no recent infectious symptoms or change in medications and has not taken any of her prescribed narcotics today.  Past Medical History  Diagnosis Date  . Arthritis   . Hypertension   . Hepatitis C    Past Surgical History  Procedure Laterality Date  . Abdominal hysterectomy    . Cesarean section    . Back surgery     Family History  Problem Relation Age of Onset  . Heart disease Mother   . Kidney disease Mother    History  Substance Use Topics  . Smoking status: Former Smoker    Quit date: 11/01/2008  . Smokeless tobacco: Not on file  . Alcohol Use: Not on file   OB History   Grav Para Term Preterm Abortions TAB SAB Ect Mult Living                 Review of Systems  All other systems reviewed and are negative.      Allergies  Penicillins  Home Medications   Current Outpatient Rx  Name  Route  Sig  Dispense  Refill  . albuterol (PROVENTIL HFA;VENTOLIN HFA) 108 (90 BASE) MCG/ACT inhaler   Inhalation   Inhale 2 puffs into the lungs every 6 (six) hours as needed for wheezing.   1 Inhaler   0   . amLODipine (NORVASC) 5 MG tablet   Oral   Take 1  tablet (5 mg total) by mouth daily.   30 tablet   3   . lactulose (CHRONULAC) 10 GM/15ML solution   Oral   Take 30 mLs (20 g total) by mouth 3 (three) times daily.   240 mL   2   . lisinopril (PRINIVIL,ZESTRIL) 40 MG tablet   Oral   Take 1 tablet (40 mg total) by mouth daily.   30 tablet   3   . traMADol (ULTRAM) 50 MG tablet   Oral   Take 1 tablet (50 mg total) by mouth every 6 (six) hours as needed for pain. 2 PO BID   30 tablet   0   . oxycodone (OXY-IR) 5 MG capsule   Oral   Take 1 capsule (5 mg total) by mouth every 4 (four) hours as needed.   10 capsule   0    BP 156/81  Pulse 85  Temp(Src) 98.1 F (36.7 C) (Oral)  Resp 18  SpO2 97% Physical Exam  Nursing note and vitals reviewed. Constitutional: She is oriented to person, place, and time. She appears well-developed and well-nourished. No distress.  HENT:  Head: Normocephalic and atraumatic.  Mouth/Throat: Oropharynx is clear and moist.  Hemostatic abrasion lower lip w/ minimal surrounding  edema.  No subluxed or fractured teeth.  No tenderness of mandible or TMJ.  Full ROM of jaw w/out pain.  Eyes:  Normal appearance  Neck: Normal range of motion.  Cardiovascular: Normal rate, regular rhythm and intact distal pulses.   Pulmonary/Chest: Effort normal and breath sounds normal.  Musculoskeletal: Normal range of motion.  Tenderness from ~C3-C6.  No step off.  Mild tenderness bilateral trap as well.   Rest of spine non-tender.  No sensory deficits upper/lower extremities.  Nml bicep and patellar reflexes.  2+ radial and DP pulses.  5/5 grip, hip abduction/adduction and ankle plantar/dorsiflexion strength.   Neurological: She is alert and oriented to person, place, and time. No sensory deficit. Coordination normal.  CN 3-12 intact.  No nystagmus. 5/5 and equal upper and lower extremity strength.  No past pointing.     Skin: Skin is warm and dry. No rash noted.  Psychiatric: She has a normal mood and affect. Her  behavior is normal.    ED Course  Procedures (including critical care time) Labs Review Labs Reviewed - No data to display Imaging Review Ct Cervical Spine Wo Contrast  07/29/2013   CLINICAL DATA:  Status post fall; neck pain.  EXAM: CT CERVICAL SPINE WITHOUT CONTRAST  TECHNIQUE: Multidetector CT imaging of the cervical spine was performed without intravenous contrast. Multiplanar CT image reconstructions were also generated.  COMPARISON:  CT of the cervical spine performed 10/27/2003  FINDINGS: There is no evidence of fracture or subluxation. Vertebral bodies demonstrate normal height and alignment. Multilevel disc space narrowing is noted along the cervical spine, with associated anterior and posterior disc osteophyte complexes. Prevertebral soft tissues are within normal limits. The visualized neural foramina are grossly unremarkable.  The thyroid gland is unremarkable in appearance. The visualized lung apices are clear. No significant soft tissue abnormalities are seen.  IMPRESSION: 1. No evidence of fracture or subluxation along the cervical spine. 2. Degenerative change noted along the cervical spine.   Electronically Signed   By: Garald Balding M.D.   On: 07/29/2013 22:47    EKG Interpretation   None       MDM   Final diagnoses:  Cervical strain    67yo F had a mechanical fall this evening, hit mouth on side on dresser and hyperextended neck.  Presents w/ neck pain.  CT cervical spine ordered d/t age, mechanism of injury and degree of pain and was negative.  Full ROM, nml strength and no NV deficits of extremities.  Hemostatic abrasion and minimal edema lower lip w/out clinically significant injury to jaw or dentition.  Prescribed 10 oxycodone (h/o hep C and peptic ulcers) for pain and recommended heat/ice and avoidance of aggravating activities as well.  Return precautions discussed.     Remer Macho, PA-C 07/30/13 [phone]

## 2013-08-02 NOTE — ED Provider Notes (Signed)
Medical screening examination/treatment/procedure(s) were performed by non-physician practitioner and as supervising physician I was immediately available for consultation/collaboration.   EKG Interpretation None       Virgel Manifold, MD 08/02/13 1425

## 2013-08-13 ENCOUNTER — Ambulatory Visit
Admission: RE | Admit: 2013-08-13 | Discharge: 2013-08-13 | Disposition: A | Discharge status: Home / Self Care | Payer: Commercial Managed Care - HMO | Source: Ambulatory Visit | Attending: *Deleted | Admitting: *Deleted

## 2013-08-13 DIAGNOSIS — C220 Liver cell carcinoma: Secondary | ICD-10-CM

## 2013-12-02 ENCOUNTER — Emergency Department (HOSPITAL_COMMUNITY)
Admission: EM | Admit: 2013-12-02 | Discharge: 2013-12-02 | Disposition: A | Discharge status: Home / Self Care | Payer: Medicare HMO | Attending: Emergency Medicine | Admitting: Emergency Medicine

## 2013-12-02 ENCOUNTER — Emergency Department (HOSPITAL_COMMUNITY): Payer: Medicare HMO

## 2013-12-02 ENCOUNTER — Encounter (HOSPITAL_COMMUNITY): Payer: Self-pay | Admitting: Emergency Medicine

## 2013-12-02 DIAGNOSIS — J159 Unspecified bacterial pneumonia: Secondary | ICD-10-CM | POA: Insufficient documentation

## 2013-12-02 DIAGNOSIS — J189 Pneumonia, unspecified organism: Secondary | ICD-10-CM

## 2013-12-02 DIAGNOSIS — M129 Arthropathy, unspecified: Secondary | ICD-10-CM | POA: Insufficient documentation

## 2013-12-02 DIAGNOSIS — Z79899 Other long term (current) drug therapy: Secondary | ICD-10-CM | POA: Insufficient documentation

## 2013-12-02 DIAGNOSIS — I10 Essential (primary) hypertension: Secondary | ICD-10-CM | POA: Insufficient documentation

## 2013-12-02 DIAGNOSIS — K5900 Constipation, unspecified: Secondary | ICD-10-CM | POA: Insufficient documentation

## 2013-12-02 DIAGNOSIS — Z880 Allergy status to penicillin: Secondary | ICD-10-CM | POA: Insufficient documentation

## 2013-12-02 DIAGNOSIS — Z87891 Personal history of nicotine dependence: Secondary | ICD-10-CM | POA: Insufficient documentation

## 2013-12-02 HISTORY — DX: Pneumonia, unspecified organism: J18.9

## 2013-12-02 LAB — CBC WITH DIFFERENTIAL/PLATELET
Basophils Absolute: 0 10*3/uL (ref 0.0–0.1)
Basophils Relative: 0 % (ref 0–1)
Eosinophils Absolute: 0 10*3/uL (ref 0.0–0.7)
Eosinophils Relative: 0 % (ref 0–5)
HEMATOCRIT: 34.6 % — AB (ref 36.0–46.0)
HEMOGLOBIN: 11.9 g/dL — AB (ref 12.0–15.0)
LYMPHS ABS: 0.5 10*3/uL — AB (ref 0.7–4.0)
Lymphocytes Relative: 11 % — ABNORMAL LOW (ref 12–46)
MCH: 28.3 pg (ref 26.0–34.0)
MCHC: 34.4 g/dL (ref 30.0–36.0)
MCV: 82.2 fL (ref 78.0–100.0)
MONO ABS: 0.5 10*3/uL (ref 0.1–1.0)
MONOS PCT: 10 % (ref 3–12)
NEUTROS ABS: 3.9 10*3/uL (ref 1.7–7.7)
NEUTROS PCT: 79 % — AB (ref 43–77)
Platelets: 120 10*3/uL — ABNORMAL LOW (ref 150–400)
RBC: 4.21 MIL/uL (ref 3.87–5.11)
RDW: 12.7 % (ref 11.5–15.5)
WBC: 4.9 10*3/uL (ref 4.0–10.5)

## 2013-12-02 LAB — URINALYSIS, ROUTINE W REFLEX MICROSCOPIC
Bilirubin Urine: NEGATIVE
GLUCOSE, UA: NEGATIVE mg/dL
KETONES UR: NEGATIVE mg/dL
LEUKOCYTES UA: NEGATIVE
NITRITE: NEGATIVE
PROTEIN: NEGATIVE mg/dL
Specific Gravity, Urine: 1.005 (ref 1.005–1.030)
Urobilinogen, UA: 1 mg/dL (ref 0.0–1.0)
pH: 6.5 (ref 5.0–8.0)

## 2013-12-02 LAB — COMPREHENSIVE METABOLIC PANEL
ALK PHOS: 105 U/L (ref 39–117)
ALT: 20 U/L (ref 0–35)
ANION GAP: 14 (ref 5–15)
AST: 28 U/L (ref 0–37)
Albumin: 3.4 g/dL — ABNORMAL LOW (ref 3.5–5.2)
BILIRUBIN TOTAL: 1 mg/dL (ref 0.3–1.2)
BUN: 7 mg/dL (ref 6–23)
CHLORIDE: 95 meq/L — AB (ref 96–112)
CO2: 24 mEq/L (ref 19–32)
Calcium: 10 mg/dL (ref 8.4–10.5)
Creatinine, Ser: 0.5 mg/dL (ref 0.50–1.10)
GLUCOSE: 109 mg/dL — AB (ref 70–99)
POTASSIUM: 3.4 meq/L — AB (ref 3.7–5.3)
Sodium: 133 mEq/L — ABNORMAL LOW (ref 137–147)
Total Protein: 8.5 g/dL — ABNORMAL HIGH (ref 6.0–8.3)

## 2013-12-02 LAB — URINE MICROSCOPIC-ADD ON

## 2013-12-02 LAB — I-STAT CG4 LACTIC ACID, ED: Lactic Acid, Venous: 1.01 mmol/L (ref 0.5–2.2)

## 2013-12-02 MED ORDER — IBUPROFEN 800 MG PO TABS
800.0000 mg | ORAL_TABLET | Freq: Once | ORAL | Status: DC
  Filled 2013-12-02: qty 1

## 2013-12-02 MED ORDER — POLYETHYLENE GLYCOL 3350 17 G PO PACK: 17.0000 g | PACK | Freq: Every day | ORAL | Status: DC | PRN

## 2013-12-02 MED ORDER — FLEET ENEMA 7-19 GM/118ML RE ENEM
1.0000 | ENEMA | Freq: Once | RECTAL | Status: AC
  Administered 2013-12-02: 1 via RECTAL
  Filled 2013-12-02: qty 1

## 2013-12-02 MED ORDER — SODIUM CHLORIDE 0.9 % IV BOLUS (SEPSIS)
2000.0000 mL | Freq: Once | INTRAVENOUS | Status: AC
  Administered 2013-12-02: 2000 mL via INTRAVENOUS

## 2013-12-02 MED ORDER — LEVOFLOXACIN 500 MG PO TABS: 500.0000 mg | ORAL_TABLET | Freq: Every day | ORAL | Status: DC

## 2013-12-02 MED ORDER — KETOROLAC TROMETHAMINE 30 MG/ML IJ SOLN
30.0000 mg | Freq: Once | INTRAMUSCULAR | Status: AC
  Administered 2013-12-02: 30 mg via INTRAVENOUS
  Filled 2013-12-02: qty 1

## 2013-12-02 MED ORDER — GADOBENATE DIMEGLUMINE 529 MG/ML IV SOLN
15.0000 mL | Freq: Once | INTRAVENOUS | Status: AC | PRN
  Administered 2013-12-02: 15 mL via INTRAVENOUS

## 2013-12-02 MED ORDER — MORPHINE SULFATE 4 MG/ML IJ SOLN
4.0000 mg | Freq: Once | INTRAMUSCULAR | Status: AC
  Administered 2013-12-02: 4 mg via INTRAVENOUS
  Filled 2013-12-02: qty 1

## 2013-12-02 MED ORDER — ACETAMINOPHEN 325 MG PO TABS
650.0000 mg | ORAL_TABLET | Freq: Four times a day (QID) | ORAL | Status: DC | PRN
  Administered 2013-12-02: 650 mg via ORAL
  Filled 2013-12-02: qty 2

## 2013-12-02 MED ORDER — LEVOFLOXACIN IN D5W 750 MG/150ML IV SOLN
750.0000 mg | Freq: Once | INTRAVENOUS | Status: AC
  Administered 2013-12-02: 750 mg via INTRAVENOUS
  Filled 2013-12-02: qty 150

## 2013-12-02 MED ORDER — LACTULOSE 10 GM/15ML PO SOLN: 10.0000 g | Freq: Two times a day (BID) | ORAL | Status: DC | PRN

## 2013-12-02 NOTE — ED Notes (Signed)
214mL of water given to patient

## 2013-12-02 NOTE — ED Provider Notes (Signed)
CSN: [id number]     Arrival date & time 12/02/13  1819 History   First MD Initiated Contact with Patient 12/02/13 1844     Chief Complaint  Patient presents with  . Fatigue  . Fever     (Consider location/radiation/quality/duration/timing/severity/associated sxs/prior Treatment) The history is provided by the patient.  BRISTYL MCLEES is a 68 y.o. female hx of HTN, hep C (previous blood transfusion), here with fever. Fever for the last 4 days, states that it was "high" at home. Also has dysuria and felt that her urine smells bad. Has back pain as well. Had previous back surgery years ago but no recent procedures. Denies abdominal pain but is constipated. Denies nausea or vomiting. No headache or neck pain or stiffness or chest pain or cough. Has been on Harvoni for Hep C for months. No recent changes in medicines.    Past Medical History  Diagnosis Date  . Arthritis   . Hypertension   . Hepatitis C    Past Surgical History  Procedure Laterality Date  . Abdominal hysterectomy    . Cesarean section    . Back surgery     Family History  Problem Relation Age of Onset  . Heart disease Mother   . Kidney disease Mother    History  Substance Use Topics  . Smoking status: Former Smoker    Quit date: 11/01/2008  . Smokeless tobacco: Not on file  . Alcohol Use: No   OB History   Grav Para Term Preterm Abortions TAB SAB Ect Mult Living                 Review of Systems  Constitutional: Positive for fever.  Musculoskeletal: Positive for back pain.  All other systems reviewed and are negative.     Allergies  Penicillins  Home Medications   Prior to Admission medications   Medication Sig Start Date End Date Taking? Authorizing Provider  amLODipine (NORVASC) 5 MG tablet Take 1 tablet (5 mg total) by mouth daily. 05/29/12  Yes Oswald Hillock, MD  HYDROcodone-acetaminophen (NORCO/VICODIN) 5-325 MG per tablet Take 1 tablet by mouth every 8 (eight) hours as needed for moderate  pain.   Yes Historical Provider, MD  Ledipasvir-Sofosbuvir (HARVONI) 90-400 MG TABS Take 1 tablet by mouth daily.   Yes Historical Provider, MD  lisinopril (PRINIVIL,ZESTRIL) 40 MG tablet Take 1 tablet (40 mg total) by mouth daily. 05/29/12  Yes Oswald Hillock, MD  traMADol (ULTRAM) 50 MG tablet Take 50-100 mg by mouth every 6 (six) hours as needed for moderate pain.   Yes Historical Provider, MD   BP 140/58  Pulse 97  Temp(Src) 102.8 F (39.3 C) (Oral)  Resp 19  SpO2 99% Physical Exam  Nursing note and vitals reviewed. Constitutional: She is oriented to person, place, and time.  Chronically ill   HENT:  Head: Normocephalic.  MM slightly dry   Eyes: Conjunctivae are normal. Pupils are equal, round, and reactive to light.  Neck: Normal range of motion. Neck supple.  Cardiovascular: Normal rate, regular rhythm and normal heart sounds.   Pulmonary/Chest: Effort normal and breath sounds normal. No respiratory distress. She has no wheezes. She has no rales.  Abdominal: Soft. Bowel sounds are normal. She exhibits no distension. There is no tenderness. There is no rebound and no guarding.  Musculoskeletal:  + lower lumbar point tenderness, ? Swelling in the area   Neurological: She is alert and oriented to person, place, and time. No cranial  nerve deficit. Coordination normal.  No saddle anesthesia, nl strength bilateral lower extremities   Skin: Skin is warm and dry.  Psychiatric: She has a normal mood and affect. Her behavior is normal. Judgment and thought content normal.    ED Course  Procedures (including critical care time) Labs Review Labs Reviewed  CBC WITH DIFFERENTIAL - Abnormal; Notable for the following:    Hemoglobin 11.9 (*)    HCT 34.6 (*)    Platelets 120 (*)    Neutrophils Relative % 79 (*)    Lymphocytes Relative 11 (*)    Lymphs Abs 0.5 (*)    All other components within normal limits  COMPREHENSIVE METABOLIC PANEL - Abnormal; Notable for the following:    Sodium  133 (*)    Potassium 3.4 (*)    Chloride 95 (*)    Glucose, Bld 109 (*)    Total Protein 8.5 (*)    Albumin 3.4 (*)    All other components within normal limits  URINALYSIS, ROUTINE W REFLEX MICROSCOPIC - Abnormal; Notable for the following:    APPearance CLOUDY (*)    Hgb urine dipstick TRACE (*)    All other components within normal limits  CULTURE, BLOOD (ROUTINE X 2)  CULTURE, BLOOD (ROUTINE X 2)  URINE CULTURE  URINE MICROSCOPIC-ADD ON  I-STAT CG4 LACTIC ACID, ED    Imaging Review Dg Chest 2 View  12/02/2013   CLINICAL DATA:  Fever, chest pain  EXAM: CHEST  2 VIEW  COMPARISON:  03/30/2013  FINDINGS: Mild increased interstitial markings/bronchitic changes. Superimposed mild patchy left lower lobe opacity, possibly reflecting pneumonia. Calcified granuloma along the lateral right mid lung. No pleural effusion or pneumothorax.  The heart is normal in size.  Degenerative changes of the visualized thoracolumbar spine.  IMPRESSION: Mild patchy left lower lobe opacity, possibly reflecting pneumonia.   Electronically Signed   By: Julian Hy M.D.   On: 12/02/2013 19:33   Mr Lumbar Spine W Wo Contrast  12/02/2013   CLINICAL DATA:  Severe low back pain and fever.  EXAM: MRI LUMBAR SPINE WITHOUT AND WITH CONTRAST  TECHNIQUE: Multiplanar and multiecho pulse sequences of the lumbar spine were obtained without and with intravenous contrast.  CONTRAST:  12mL MULTIHANCE GADOBENATE DIMEGLUMINE 529 MG/ML IV SOLN  COMPARISON:  None.  FINDINGS: Normal signal is present in the conus medullaris which terminates at L2, the lower limits of normal. The Mild endplate degenerative changes are noted at L5-S1. There is no fluid in the disc space or enhancement of the disc spaces. Limited imaging of the abdomen is unremarkable.  L1-2:  Negative.  L2-3:  Negative.  L3-4: A leftward disc protrusion and annular tear are noted. Mild left lateral recess and foraminal stenosis are evident.  L4-5: A broad-based disc  protrusion is present. Moderate facet hypertrophy is present bilaterally. This results in mild lateral recess narrowing, right greater than left. Mild foraminal narrowing is evident bilaterally as well. The patient is status post right laminectomy.  L5-S1: A leftward broad-based disc protrusion is present. Mild facet hypertrophy is noted bilaterally. This results in mild left lateral recess and bilateral foraminal stenosis.  IMPRESSION: 1. Mild left lateral recess and foraminal stenosis at L3-4. 2. Mild lateral recess narrowing, right greater than left at L4-5 due to a broad-based disc protrusion and facet spurring. 3. Mild foraminal narrowing bilaterally at L4-5. 4. Status post right laminectomy at L4-5. 5. Mild left lateral recess and bilateral foraminal narrowing at L5-S1 secondary to a leftward  disc protrusion and bilateral facet hypertrophy.   Electronically Signed   By: Lawrence Santiago M.D.   On: 12/02/2013 21:09   Dg Abd 2 Views  12/02/2013   CLINICAL DATA:  Fever and abdominal pain.  EXAM: ABDOMEN - 2 VIEW  COMPARISON:  Abdominal radiographs 03/10/2010  FINDINGS: The bowel gas pattern is normal. There is no evidence of free air. No suspicious radio-opaque calculi or other significant radiographic abnormality is seen. Mild amount of retained large bowel stool. Severe degenerative changes of the lower thoracic spine. Phleboliths in the pelvis.  Apparent right midlung zone granuloma partially imaged. Severe degenerative change at the included left shoulder.  IMPRESSION: Nonspecific bowel gas pattern with mild amount of retained large bowel stool.   Electronically Signed   By: Elon Alas   On: 12/02/2013 19:36     EKG Interpretation None      MDM   Final diagnoses:  None   BLOSSIE RAFFEL is a 68 y.o. female here with back pain, fever. I am concerned for pyelo vs epidural abscess. Will also do sepsis workup. No meningeal signs so I doubt meningitis.   9:44 PM MRI showed no epidural  abscess. WBC nl. Lactate nl. Cultures sent. CXR showed possible pneumonia. Given levaquin. Also has constipation. Will give enema. Has been on lactulose and miralax previously. Will put on same regimen. Safe for d/c.     Wandra Arthurs, MD 12/02/13 2218

## 2013-12-02 NOTE — ED Notes (Signed)
Pt reports fever since Monday, weakness since yesterday, constipation for months.

## 2013-12-02 NOTE — ED Notes (Signed)
Darl Householder, EDP shown CG4 Lactic results.

## 2013-12-02 NOTE — ED Notes (Signed)
Patient transported to MRI 

## 2013-12-02 NOTE — ED Notes (Signed)
Pt c/o fever and weakness that stared yesterday. Pt states that she hasnt had BM in several weeks since she started a new med for Hep C.

## 2013-12-02 NOTE — Discharge Instructions (Signed)
Take levaquin for a week.   Continue taking tylenol, motrin for fever.   Stay hydrated.   Take miralax or lactulose daily for constipation.   Follow up with your doctor.   Return to ER if you have fever, vomiting, abdominal pain, worse back pain.

## 2013-12-02 NOTE — ED Notes (Signed)
Patient is alert and oriented x3.  She was given DC instructions and follow up visit instructions.  Patient gave verbal understanding. She was DC ambulatory under her own power to home.  V/S stable.  He was not showing any signs of distress on DC 

## 2013-12-04 LAB — URINE CULTURE: Colony Count: 5000

## 2013-12-08 LAB — CULTURE, BLOOD (ROUTINE X 2)
CULTURE: NO GROWTH
Culture: NO GROWTH

## 2014-01-19 ENCOUNTER — Other Ambulatory Visit (HOSPITAL_COMMUNITY): Payer: Self-pay | Admitting: Cardiology

## 2014-01-19 ENCOUNTER — Other Ambulatory Visit: Payer: Self-pay | Admitting: Cardiology

## 2014-01-19 ENCOUNTER — Other Ambulatory Visit: Payer: Self-pay

## 2014-01-19 DIAGNOSIS — R5383 Other fatigue: Principal | ICD-10-CM

## 2014-01-19 DIAGNOSIS — R634 Abnormal weight loss: Secondary | ICD-10-CM

## 2014-01-19 DIAGNOSIS — R55 Syncope and collapse: Secondary | ICD-10-CM

## 2014-01-19 DIAGNOSIS — R5381 Other malaise: Secondary | ICD-10-CM

## 2014-01-19 DIAGNOSIS — Z1231 Encounter for screening mammogram for malignant neoplasm of breast: Secondary | ICD-10-CM

## 2014-01-20 ENCOUNTER — Ambulatory Visit (HOSPITAL_COMMUNITY)
Admission: RE | Admit: 2014-01-20 | Discharge: 2014-01-20 | Disposition: A | Discharge status: Home / Self Care | Payer: Medicare HMO | Source: Ambulatory Visit | Attending: Cardiology | Admitting: Cardiology

## 2014-01-20 DIAGNOSIS — R634 Abnormal weight loss: Secondary | ICD-10-CM | POA: Diagnosis present

## 2014-01-20 DIAGNOSIS — R5381 Other malaise: Secondary | ICD-10-CM

## 2014-01-20 DIAGNOSIS — I6529 Occlusion and stenosis of unspecified carotid artery: Secondary | ICD-10-CM | POA: Diagnosis not present

## 2014-01-20 DIAGNOSIS — R55 Syncope and collapse: Secondary | ICD-10-CM

## 2014-01-20 DIAGNOSIS — R5383 Other fatigue: Secondary | ICD-10-CM

## 2014-01-20 NOTE — Progress Notes (Signed)
*  PRELIMINARY RESULTS* Vascular Ultrasound Carotid Duplex (Doppler) has been completed.  Findings suggest 1-39% internal carotid artery stenosis bilaterally. Vertebral arteries are patent with antegrade flow.   01/20/2014  Estella Husk, RVT

## 2014-01-24 ENCOUNTER — Ambulatory Visit
Admission: RE | Admit: 2014-01-24 | Discharge: 2014-01-24 | Disposition: A | Discharge status: Home / Self Care | Payer: Commercial Managed Care - HMO | Source: Ambulatory Visit | Attending: Cardiology | Admitting: Cardiology

## 2014-01-24 DIAGNOSIS — Z1231 Encounter for screening mammogram for malignant neoplasm of breast: Secondary | ICD-10-CM

## 2014-02-10 ENCOUNTER — Other Ambulatory Visit: Payer: Self-pay | Admitting: Nurse Practitioner

## 2014-02-10 DIAGNOSIS — C220 Liver cell carcinoma: Secondary | ICD-10-CM

## 2014-03-04 ENCOUNTER — Ambulatory Visit
Admission: RE | Admit: 2014-03-04 | Discharge: 2014-03-04 | Disposition: A | Discharge status: Home / Self Care | Payer: Commercial Managed Care - HMO | Source: Ambulatory Visit | Attending: Nurse Practitioner | Admitting: Nurse Practitioner

## 2014-03-04 DIAGNOSIS — C220 Liver cell carcinoma: Secondary | ICD-10-CM

## 2014-03-31 ENCOUNTER — Emergency Department (HOSPITAL_COMMUNITY)
Admission: EM | Admit: 2014-03-31 | Discharge: 2014-03-31 | Disposition: A | Discharge status: Home / Self Care | Payer: Medicare HMO | Attending: Emergency Medicine | Admitting: Emergency Medicine

## 2014-03-31 ENCOUNTER — Encounter (HOSPITAL_COMMUNITY): Payer: Self-pay | Admitting: Emergency Medicine

## 2014-03-31 DIAGNOSIS — M5136 Other intervertebral disc degeneration, lumbar region: Secondary | ICD-10-CM | POA: Diagnosis not present

## 2014-03-31 DIAGNOSIS — Z880 Allergy status to penicillin: Secondary | ICD-10-CM | POA: Insufficient documentation

## 2014-03-31 DIAGNOSIS — M5030 Other cervical disc degeneration, unspecified cervical region: Secondary | ICD-10-CM | POA: Insufficient documentation

## 2014-03-31 DIAGNOSIS — M19012 Primary osteoarthritis, left shoulder: Secondary | ICD-10-CM

## 2014-03-31 DIAGNOSIS — Z8619 Personal history of other infectious and parasitic diseases: Secondary | ICD-10-CM | POA: Insufficient documentation

## 2014-03-31 DIAGNOSIS — Z79899 Other long term (current) drug therapy: Secondary | ICD-10-CM | POA: Insufficient documentation

## 2014-03-31 DIAGNOSIS — M25512 Pain in left shoulder: Secondary | ICD-10-CM | POA: Diagnosis present

## 2014-03-31 DIAGNOSIS — I10 Essential (primary) hypertension: Secondary | ICD-10-CM | POA: Insufficient documentation

## 2014-03-31 DIAGNOSIS — Z87891 Personal history of nicotine dependence: Secondary | ICD-10-CM | POA: Diagnosis not present

## 2014-03-31 HISTORY — DX: Other cervical disc degeneration, unspecified cervical region: M50.30

## 2014-03-31 HISTORY — DX: Other intervertebral disc degeneration, lumbar region without mention of lumbar back pain or lower extremity pain: M51.369

## 2014-03-31 HISTORY — DX: Other intervertebral disc degeneration, lumbar region: M51.36

## 2014-03-31 MED ORDER — HYDROMORPHONE HCL 1 MG/ML IJ SOLN
1.0000 mg | Freq: Once | INTRAMUSCULAR | Status: AC
  Administered 2014-03-31: 1 mg via INTRAMUSCULAR
  Filled 2014-03-31: qty 1

## 2014-03-31 MED ORDER — HYDROCODONE-ACETAMINOPHEN 5-325 MG PO TABS: 1.0000 | ORAL_TABLET | Freq: Four times a day (QID) | ORAL | Status: DC | PRN

## 2014-03-31 NOTE — Discharge Instructions (Signed)
Follow-up with your orthopedic doctor for reassessment if not improved in 1 week. Return for shoulder swelling, fevers, chest pain, shortness of breath, weakness or numbness in her arm or leg. For severe pain take norco or vicodin however realize they have the potential for addiction and it can make you sleepy and has tylenol in it.  No operating machinery while taking. If you were given medicines take as directed.  If you are on coumadin or contraceptives realize their levels and effectiveness is altered by many different medicines.  If you have any reaction (rash, tongues swelling, other) to the medicines stop taking and see a physician.   Please follow up as directed and return to the ER or see a physician for new or worsening symptoms.  Thank you. Filed Vitals:   03/31/14 0904  BP: 153/83  Pulse: 80  Temp: 98 F (36.7 C)  TempSrc: Oral  Resp: 18  SpO2: 99%

## 2014-03-31 NOTE — ED Provider Notes (Signed)
CSN: [id number]     Arrival date & time 03/31/14  4098 History   First MD Initiated Contact with Patient 03/31/14 [phone]     Chief Complaint  Patient presents with  . Arthritis  . Arm Pain     (Consider location/radiation/quality/duration/timing/severity/associated sxs/prior Treatment) HPI Comments: 68 year old female with history of hepatitis C, high blood pressure, arthritis in the knees and shoulders presents with worsening left shoulder pain since running out of her narcotics. Patient is a past smoker. Patient does follow closely with orthopedics and has had injections MRI and x-rays in the past. Patient feels this is similar but more severe than her normal pain worse with range of motion. No swelling fevers weakness or chills. No chest pain or shortness of breath, no cardiac history.  The history is provided by the patient.    Past Medical History  Diagnosis Date  . Arthritis   . Hypertension   . Hepatitis C   . DDD (degenerative disc disease), lumbar   . DDD (degenerative disc disease), cervical    Past Surgical History  Procedure Laterality Date  . Abdominal hysterectomy    . Cesarean section    . Back surgery    . Appendectomy     Family History  Problem Relation Age of Onset  . Heart disease Mother   . Kidney disease Mother    History  Substance Use Topics  . Smoking status: Former Smoker    Quit date: 11/01/2008  . Smokeless tobacco: Not on file  . Alcohol Use: No   OB History   Grav Para Term Preterm Abortions TAB SAB Ect Mult Living                 Review of Systems  Constitutional: Negative for fever and chills.  Respiratory: Negative for shortness of breath.   Cardiovascular: Negative for chest pain.  Gastrointestinal: Negative for vomiting and abdominal pain.  Musculoskeletal: Positive for arthralgias. Negative for back pain, neck pain and neck stiffness.  Skin: Negative for rash.  Neurological: Negative for weakness, light-headedness, numbness and  headaches.      Allergies  Penicillins  Home Medications   Prior to Admission medications   Medication Sig Start Date End Date Taking? Authorizing Provider  amLODipine (NORVASC) 5 MG tablet Take 1 tablet (5 mg total) by mouth daily. 05/29/12  Yes Oswald Hillock, MD  colchicine 0.6 MG tablet Take 0.6 mg by mouth daily.   Yes Historical Provider, MD  docusate sodium (COLACE) 100 MG capsule Take 100 mg by mouth 2 (two) times daily.   Yes Historical Provider, MD  HYDROcodone-acetaminophen (NORCO) 10-325 MG per tablet Take 1 tablet by mouth every 6 (six) hours as needed for moderate pain.   Yes Historical Provider, MD  lisinopril (PRINIVIL,ZESTRIL) 40 MG tablet Take 1 tablet (40 mg total) by mouth daily. 05/29/12  Yes Oswald Hillock, MD  traMADol (ULTRAM) 50 MG tablet Take 50-100 mg by mouth every 6 (six) hours as needed for moderate pain.   Yes Historical Provider, MD  HYDROcodone-acetaminophen (NORCO) 5-325 MG per tablet Take 1-2 tablets by mouth every 6 (six) hours as needed. 03/31/14   Mariea Clonts, MD   BP 153/83  Pulse 80  Temp(Src) 98 F (36.7 C) (Oral)  Resp 18  SpO2 99% Physical Exam  Nursing note and vitals reviewed. Constitutional: She is oriented to person, place, and time. She appears well-developed and well-nourished.  HENT:  Head: Normocephalic and atraumatic.  Eyes: Right eye exhibits  no discharge. Left eye exhibits no discharge.  Neck: Normal range of motion. Neck supple. No tracheal deviation present.  Cardiovascular: Normal rate.   Pulmonary/Chest: Effort normal.  Musculoskeletal: She exhibits tenderness. She exhibits no edema.  Patient has moderate to severe tenderness with flexion and abduction of the left shoulder. No warmth or joint swelling. Patient has 5+ strength with elbow flexion wrist attention finger abduction and in grip strength on the left hand. 2+ distal pulses in the left arm.  Neurological: She is alert and oriented to person, place, and time.   Skin: Skin is warm. No rash noted.  Psychiatric: She has a normal mood and affect.    ED Course  Procedures (including critical care time) Labs Review Labs Reviewed - No data to display  Imaging Review No results found.   EKG Interpretation None     EKG reviewed her rate 74, normal axis, mild T-wave flattening nonspecific similar to previous EKG, normal QT, no acute ST elevation. MDM   Final diagnoses:  Left shoulder pain  Osteoarthritis, localized, shoulder, left   Reviewed patient previous imaging including MRI done in the past showing significant degeneration arthritis tendinosis.  This is similar to previous and patient has been out of her pain meds. Discussed importance of close follow-up with orthopedics. I do not feel patient needs emergent shoulder tap for septic joint at this time without warmth or swelling and the fact patient has been out of her pain meds and this is similar to previous.  Patient's pain improved significantly on recheck. Plan for short course of oral pain meds and follow-up with orthopedics. Results and differential diagnosis were discussed with the patient/parent/guardian. Close follow up outpatient was discussed, comfortable with the plan.   Medications  HYDROmorphone (DILAUDID) injection 1 mg (1 mg Intramuscular Given 03/31/14 0935)    Filed Vitals:   03/31/14 0904  BP: 153/83  Pulse: 80  Temp: 98 F (36.7 C)  TempSrc: Oral  Resp: 18  SpO2: 99%    Final diagnoses:  Left shoulder pain  Osteoarthritis, localized, shoulder, left      Mariea Clonts, MD 03/31/14 1124

## 2014-03-31 NOTE — ED Notes (Signed)
Patient's daughter now present to provide transportation for patient due to admin of pain medication Patient's daughter asking for a note for school since she came here to drive patient home Patient's daughter informed that notes are not provided to family members of patient's--notes only given to patients Patient's daughter upset This nurse apologized to patient's daughter, but informed her again that notes are reserved for patients only

## 2014-03-31 NOTE — ED Notes (Signed)
Patient states that her daughter will be providing transportation home due to admin of pain medication

## 2014-03-31 NOTE — ED Notes (Signed)
Patient stable at time of DC Patient declined use of wheelchair Patient ambulatory and in NAD

## 2014-03-31 NOTE — ED Notes (Signed)
Patient arrives with c/o left arm/joint pain  Patient states that she receives steroid shots in her left arm--last one "a couple of months ago" Patient states that she usually takes Vicodin and Ultram for the pain, but has run out of her medications Patient states that she has not called her PCP, "so I don't know if they can see me today or not."

## 2014-04-05 ENCOUNTER — Encounter: Payer: Self-pay | Admitting: Physical Medicine & Rehabilitation

## 2014-04-11 ENCOUNTER — Other Ambulatory Visit (HOSPITAL_COMMUNITY): Payer: Self-pay | Admitting: Cardiology

## 2014-04-11 DIAGNOSIS — R011 Cardiac murmur, unspecified: Secondary | ICD-10-CM

## 2014-04-15 ENCOUNTER — Ambulatory Visit (HOSPITAL_COMMUNITY): Payer: Medicare HMO

## 2014-04-18 ENCOUNTER — Encounter: Payer: Self-pay | Admitting: Physical Medicine & Rehabilitation

## 2014-04-18 ENCOUNTER — Ambulatory Visit (HOSPITAL_BASED_OUTPATIENT_CLINIC_OR_DEPARTMENT_OTHER): Payer: Medicare HMO | Admitting: Physical Medicine & Rehabilitation

## 2014-04-18 ENCOUNTER — Encounter: Payer: Medicare HMO | Attending: Physical Medicine & Rehabilitation

## 2014-04-18 VITALS — BP 162/89 | HR 84 | Resp 14 | Ht 62.0 in | Wt 165.0 lb

## 2014-04-18 DIAGNOSIS — G894 Chronic pain syndrome: Secondary | ICD-10-CM | POA: Diagnosis not present

## 2014-04-18 DIAGNOSIS — Z79899 Other long term (current) drug therapy: Secondary | ICD-10-CM

## 2014-04-18 DIAGNOSIS — M19012 Primary osteoarthritis, left shoulder: Secondary | ICD-10-CM | POA: Insufficient documentation

## 2014-04-18 DIAGNOSIS — Z5181 Encounter for therapeutic drug level monitoring: Secondary | ICD-10-CM | POA: Insufficient documentation

## 2014-04-18 DIAGNOSIS — M1712 Unilateral primary osteoarthritis, left knee: Secondary | ICD-10-CM

## 2014-04-18 DIAGNOSIS — M1711 Unilateral primary osteoarthritis, right knee: Secondary | ICD-10-CM | POA: Insufficient documentation

## 2014-04-18 MED ORDER — TRAMADOL HCL 50 MG PO TABS: 50.0000 mg | ORAL_TABLET | Freq: Three times a day (TID) | ORAL | Status: DC

## 2014-04-18 NOTE — Patient Instructions (Signed)
Knee Injection Joint injections are shots. Your caregiver will place a needle into your knee joint. The needle is used to put medicine into the joint. These shots can be used to help treat different painful knee conditions such as osteoarthritis, bursitis, local flare-ups of rheumatoid arthritis, and pseudogout. Anti-inflammatory medicines such as corticosteroids and anesthetics are the most common medicines used for joint and soft tissue injections.  PROCEDURE  The skin over the kneecap will be cleaned with an antiseptic solution.  Your caregiver will inject a small amount of a local anesthetic (a medicine like Novocaine) just under the skin in the area that was cleaned.  After the area becomes numb, a second injection is done. This second injection usually includes an anesthetic and an anti-inflammatory medicine called a steroid or cortisone. The needle is carefully placed in between the kneecap and the knee, and the medicine is injected into the joint space.  After the injection is done, the needle is removed. Your caregiver may place a bandage over the injection site. The whole procedure takes no more than a couple of minutes. BEFORE THE PROCEDURE  Wash all of the skin around the entire knee area. Try to remove any loose, scaling skin. There is no other specific preparation necessary unless advised otherwise by your caregiver. LET YOUR CAREGIVER KNOW ABOUT:   Allergies.  Medications taken including herbs, eye drops, over the counter medications, and creams.  Use of steroids (by mouth or creams).  Possible pregnancy, if applicable.  Previous problems with anesthetics or Novocaine.  History of blood clots (thrombophlebitis).  History of bleeding or blood problems.  Previous surgery.  Other health problems. RISKS AND COMPLICATIONS Side effects from cortisone shots are rare. They include:   Slight bruising of the skin.  Shrinkage of the normal fatty tissue under the skin where  the shot was given.  Increase in pain after the shot.  Infection.  Weakening of tendons or tendon rupture.  Allergic reaction to the medicine.  Diabetics may have a temporary increase in their blood sugar after a shot.  Cortisone can temporarily weaken the immune system. While receiving these shots, you should not get certain vaccines. Also, avoid contact with anyone who has chickenpox or measles. Especially if you have never had these diseases or have not been previously immunized. Your immune system may not be strong enough to fight off the infection while the cortisone is in your system. AFTER THE PROCEDURE   You can go home after the procedure.  You may need to put ice on the joint 15-20 minutes every 3 or 4 hours until the pain goes away.  You may need to put an elastic bandage on the joint. HOME CARE INSTRUCTIONS   Only take over-the-counter or prescription medicines for pain, discomfort, or fever as directed by your caregiver.  You should avoid stressing the joint. Unless advised otherwise, avoid activities that put a lot of pressure on a knee joint, such as:  Jogging.  Bicycling.  Recreational climbing.  Hiking.  Laying down and elevating the leg/knee above the level of your heart can help to minimize swelling. SEEK MEDICAL CARE IF:   You have repeated or worsening swelling.  There is drainage from the puncture area.  You develop red streaking that extends above or below the site where the needle was inserted. SEEK IMMEDIATE MEDICAL CARE IF:   You develop a fever.  You have pain that gets worse even though you are taking pain medicine.  The area is   red and warm, and you have trouble moving the joint. MAKE SURE YOU:   Understand these instructions.  Will watch your condition.  Will get help right away if you are not doing well or get worse. Document Released: 08/11/2006 Document Revised: 08/12/2011 Document Reviewed: 05/08/2007 Exit[employer] Patient  Information 2015 [employer]. This information is not intended to replace advice given to you by your health care provider. Make sure you discuss any questions you have with your health care provider.  

## 2014-04-18 NOTE — Progress Notes (Signed)
Subjective:    Patient ID: Ashley Riddle, female    DOB: 05/29/1946, 68 y.o.   MRN: [id number]  HPI CC:  Left shoulder pain and bilateral knee pain  Left knee is worse than right  Smokes marijuana for "pain"  Seen by orthopedics, Left shoulder injection was not very helpful, knee injections help about 2 months.Patient does not remember last injection she thinks it may have been around 6 months ago but definitely in 2015  X-rays of left knee performed in 2012 show mild medial compartment narrowing. Pain Inventory Average Pain 10 Pain Right Now 10 My pain is stabbing and aching  In the last 24 hours, has pain interfered with the following? General activity 5 Relation with others 5 Enjoyment of life 5 What TIME of day is your pain at its worst? morning evening and night Sleep (in general) NA  Pain is worse with: walking, bending, sitting, inactivity and standing Pain improves with: rest, heat/ice, medication and injections Relief from Meds: n/a  Mobility ability to climb steps?  no do you drive?  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  no MRI OF THE LEFT SHOULDER WITHOUT CONTRAST   Radiographs done the same date are correlated.    There are moderate glenohumeral degenerative changes with a large osteophyte projecting inferomedially from the humeral head and diffuse subchondral cystic change throughout the glenoid, especially inferiorly. There is diffuse fraying of the glenoid labra with a probable degenerative tear posteriorly. The intra-articular portion of the long head of the biceps tendon demonstrates moderate tendinosis, but appears intact and shows no subluxation from the bicipital groove.   The rotator cuff demonstrates moderate generalized  tendinosis, predominately affecting the subscapularis and supraspinatus tendons. No full thickness rotator cuff tear is demonstrated. The acromion is Type II with moderate lateral downsloping. There is mild acromioclavicular joint hypertrophy.    IMPRESSION   1. Advanced glenohumeral degenerative changes with osteophytes and subchondral cystic change. There is generalized fraying of the glenoid labra with a probable degenerative tear posteriorly.    2. Moderately advanced bicipital tendinosis without apparent tear.   3. Moderate rotator cuff tendinosis. No evidence of full thickness rotator cuff tear.  Physicians involved in your care Any changes since last visit?  no   Family History  Problem Relation Age of Onset  . Heart disease Mother   . Kidney disease Mother   . Alcohol abuse Mother    History   Social History  . Marital Status: Widowed    Spouse Name: N/A    Number of Children: N/A  . Years of Education: N/A   Social History Main Topics  . Smoking status: Former Smoker    Quit date: 11/01/2008  . Smokeless tobacco: None  . Alcohol Use: No  . Drug Use: None  . Sexual Activity: None   Other Topics Concern  . None   Social History Narrative   Past Surgical History  Procedure Laterality Date  . Abdominal hysterectomy    . Cesarean section    . Back surgery    . Appendectomy     Past Medical History  Diagnosis Date  . Arthritis   . Hypertension   . Hepatitis C   . DDD (degenerative disc disease), lumbar   . DDD (degenerative disc disease),  cervical    BP 162/89 mmHg  Pulse 84  Resp 14  Ht 5\' 2"  (1.575 m)  Wt 165 lb (74.844 kg)  BMI 30.17 kg/m2  SpO2 100%  Opioid Risk Score: 6 Fall Risk Score: Moderate Fall Risk (6-13 points) (educated and given handout on fall prevention in the home) Review of Systems  Constitutional: Positive for diaphoresis, appetite change and unexpected weight change.  Gastrointestinal: Positive for constipation.    Musculoskeletal: Positive for gait problem.  Neurological: Positive for weakness and numbness.  Psychiatric/Behavioral: The patient is nervous/anxious.   All other systems reviewed and are negative.      Objective:   Physical Exam  Constitutional: She is oriented to person, place, and time. She appears well-developed and well-nourished.  Musculoskeletal:       Right shoulder: Normal.       Left shoulder: She exhibits decreased range of motion.       Right hip: Normal.       Left hip: Normal.       Right knee: She exhibits normal range of motion, no swelling, no effusion and no deformity. Tenderness found. Medial joint line, lateral joint line and patellar tendon tenderness noted.       Left knee: She exhibits normal range of motion, no swelling, no effusion and no deformity. Tenderness found. Medial joint line, lateral joint line and patellar tendon tenderness noted.  Neurological: She is oriented to person, place, and time. She has normal strength. No sensory deficit.  Reflex Scores:      Tricep reflexes are 2+ on the right side and 2+ on the left side.      Bicep reflexes are 2+ on the right side and 2+ on the left side.      Brachioradialis reflexes are 2+ on the right side and 2+ on the left side.      Patellar reflexes are 2+ on the right side and 2+ on the left side.      Achilles reflexes are 1+ on the right side and 1+ on the left side. Psychiatric: Her affect is labile. Her speech is delayed. She is slowed. She exhibits abnormal remote memory.  Nursing note and vitals reviewed.         Assessment & Plan:  1. Left shoulder severe glenohumeral osteoarthritis with contracture. Has had limited results with intra-articular injections palpation guided. She may benefit from ultrasound guided intra-articular injection or suprascapular nerve block will schedule for this.  2. Bilateral knee osteoarthritis left greater than right mainly affecting medial compartment. We will inject  the left knee today since this is more symptomatic for her.  Knee injection Without ultrasound guidance  Indication:Left Knee pain not relieved by medication management and other conservative care.  Informed consent was obtained after describing risks and benefits of the procedure with the patient, this includes bleeding, bruising, infection and medication side effects. The patient wishes to proceed and has given written consent. The patient was placed in a recumbent position. The medial aspect of the knee was marked and prepped with Betadine and alcohol. It was then entered with a 25-gauge 1-1/2 inch needle, after negative draw back for blood, a solution containing one ML of 6mg  per mL betamethasone and 4 mL of 1% lidocaine were injected. The patient tolerated the procedure well. Post procedure instructions were given.  3. Chronic pain syndrome, based on her ongoing illicit drug abuse will not prescribe Schedule 2 or schedule 3 narcotic analgesic medication.  Discussed with the patient,  She states tramadol doesn't help a lot but does help some Will write for 50 mg 3 times a day, Last LFTs 12/02/2013 were normal

## 2014-04-19 ENCOUNTER — Other Ambulatory Visit: Payer: Self-pay | Admitting: Physical Medicine & Rehabilitation

## 2014-04-20 LAB — PMP ALCOHOL METABOLITE (ETG): Ethyl Glucuronide (EtG): NEGATIVE ng/mL

## 2014-04-22 ENCOUNTER — Ambulatory Visit (HOSPITAL_COMMUNITY)
Admission: RE | Admit: 2014-04-22 | Discharge: 2014-04-22 | Disposition: A | Discharge status: Home / Self Care | Payer: Medicare HMO | Source: Ambulatory Visit | Attending: Cardiology | Admitting: Cardiology

## 2014-04-22 DIAGNOSIS — R011 Cardiac murmur, unspecified: Secondary | ICD-10-CM

## 2014-04-22 DIAGNOSIS — I10 Essential (primary) hypertension: Secondary | ICD-10-CM | POA: Insufficient documentation

## 2014-04-22 DIAGNOSIS — I359 Nonrheumatic aortic valve disorder, unspecified: Secondary | ICD-10-CM

## 2014-04-22 LAB — PRESCRIPTION MONITORING PROFILE (SOLSTAS)
Amphetamine/Meth: NEGATIVE ng/mL
Barbiturate Screen, Urine: NEGATIVE ng/mL
Benzodiazepine Screen, Urine: NEGATIVE ng/mL
Buprenorphine, Urine: NEGATIVE ng/mL
CANNABINOID SCRN UR: NEGATIVE ng/mL
COCAINE METABOLITES: NEGATIVE ng/mL
CREATININE, URINE: 18.71 mg/dL — AB (ref >20.0)
Carisoprodol, Urine: NEGATIVE ng/mL
ECSTASY: NEGATIVE ng/mL
FENTANYL URINE: NEGATIVE ng/mL
METHADONE SCREEN, URINE: NEGATIVE ng/mL
Meperidine, Ur: NEGATIVE ng/mL
Nitrites, Initial: NEGATIVE ug/mL
Oxycodone Screen, Ur: NEGATIVE ng/mL
Propoxyphene: NEGATIVE ng/mL
Tapentadol, urine: NEGATIVE ng/mL
Zolpidem, Urine: NEGATIVE ng/mL
pH, Initial: 6.9 pH (ref 4.5–8.9)

## 2014-04-22 LAB — OPIATES/OPIOIDS (LC/MS-MS)
Codeine Urine: NEGATIVE ng/mL (ref <50)
HYDROCODONE: 431 ng/mL (ref <50)
Hydromorphone: 60 ng/mL (ref <50)
Morphine Urine: NEGATIVE ng/mL (ref <50)
Norhydrocodone, Ur: 553 ng/mL (ref <50)
Noroxycodone, Ur: NEGATIVE ng/mL (ref <50)
OXYCODONE, UR: NEGATIVE ng/mL (ref <50)
OXYMORPHONE, URINE: NEGATIVE ng/mL (ref <50)

## 2014-04-22 LAB — TRAMADOL, URINE
N-DESMETHYL-CIS-TRAMADOL: 4460 ng/mL — AB (ref <100)
TRAMADOL, URINE: 13070 ng/mL — AB (ref <100)

## 2014-04-22 NOTE — Progress Notes (Signed)
Echocardiogram 2D Echocardiogram has been performed.  Ashley Riddle 04/22/2014, 3:05 PM

## 2014-05-03 ENCOUNTER — Ambulatory Visit: Payer: Commercial Managed Care - HMO | Admitting: Physical Medicine & Rehabilitation

## 2014-05-12 NOTE — Progress Notes (Addendum)
Urine drug screen for this encounter is consistent for reported medication hydrocodone and historical medication tramadol.Negative for marijuana

## 2014-05-17 ENCOUNTER — Encounter: Payer: Medicare HMO | Attending: Physical Medicine & Rehabilitation

## 2014-05-17 ENCOUNTER — Ambulatory Visit (HOSPITAL_BASED_OUTPATIENT_CLINIC_OR_DEPARTMENT_OTHER): Payer: Medicare HMO | Admitting: Physical Medicine & Rehabilitation

## 2014-05-17 ENCOUNTER — Encounter: Payer: Self-pay | Admitting: Physical Medicine & Rehabilitation

## 2014-05-17 VITALS — BP 144/87 | HR 75 | Resp 14 | Wt 167.0 lb

## 2014-05-17 DIAGNOSIS — M1712 Unilateral primary osteoarthritis, left knee: Secondary | ICD-10-CM | POA: Diagnosis not present

## 2014-05-17 DIAGNOSIS — G894 Chronic pain syndrome: Secondary | ICD-10-CM | POA: Diagnosis not present

## 2014-05-17 DIAGNOSIS — M19012 Primary osteoarthritis, left shoulder: Secondary | ICD-10-CM | POA: Diagnosis not present

## 2014-05-17 DIAGNOSIS — G8929 Other chronic pain: Secondary | ICD-10-CM

## 2014-05-17 DIAGNOSIS — Z5181 Encounter for therapeutic drug level monitoring: Secondary | ICD-10-CM | POA: Diagnosis present

## 2014-05-17 DIAGNOSIS — M25512 Pain in left shoulder: Secondary | ICD-10-CM

## 2014-05-17 DIAGNOSIS — Z79899 Other long term (current) drug therapy: Secondary | ICD-10-CM | POA: Diagnosis not present

## 2014-05-17 DIAGNOSIS — M1711 Unilateral primary osteoarthritis, right knee: Secondary | ICD-10-CM | POA: Diagnosis not present

## 2014-05-17 NOTE — Patient Instructions (Signed)
You had a left suprascapular nerve block. This is to block pain from the left shoulder. If this is helpful and wears off please call 2 make another appointment.

## 2014-05-17 NOTE — Progress Notes (Signed)
Left Suprascapular nerve block  Indication Glenohumeral Osteoarthritis not responding to conservative treatments including medications and intrarticular injections  Patient placed in the seated position. Linear transducer used. Medial to lateral approach. Anatomical coronal oblique plane. Probe placed parallel to the spine of the scapula and over the suprascapular fossa. Needle was visualized descending into the suprascapular notch. 22-gauge echo block 80 mm needle utilized. Skin and subcutaneous was anesthetized with a 25-gauge  1.5 inch needle with 1% lidocaine first injected. After proper positioning of the needle was obtained a solution containing 1 mL of 6 program per mL Celestone +4 mL of 1% lidocaine were injected. Patient tolerated procedure well

## 2014-08-20 ENCOUNTER — Encounter (HOSPITAL_COMMUNITY): Payer: Self-pay | Admitting: Emergency Medicine

## 2014-08-20 ENCOUNTER — Emergency Department (HOSPITAL_COMMUNITY): Payer: Medicare HMO

## 2014-08-20 ENCOUNTER — Emergency Department (HOSPITAL_COMMUNITY)
Admission: EM | Admit: 2014-08-20 | Discharge: 2014-08-20 | Disposition: A | Discharge status: Home / Self Care | Payer: Medicare HMO | Attending: Emergency Medicine | Admitting: Emergency Medicine

## 2014-08-20 DIAGNOSIS — Z8619 Personal history of other infectious and parasitic diseases: Secondary | ICD-10-CM | POA: Diagnosis not present

## 2014-08-20 DIAGNOSIS — I10 Essential (primary) hypertension: Secondary | ICD-10-CM | POA: Insufficient documentation

## 2014-08-20 DIAGNOSIS — Z79899 Other long term (current) drug therapy: Secondary | ICD-10-CM | POA: Insufficient documentation

## 2014-08-20 DIAGNOSIS — Z87891 Personal history of nicotine dependence: Secondary | ICD-10-CM | POA: Insufficient documentation

## 2014-08-20 DIAGNOSIS — M159 Polyosteoarthritis, unspecified: Secondary | ICD-10-CM

## 2014-08-20 DIAGNOSIS — M150 Primary generalized (osteo)arthritis: Secondary | ICD-10-CM

## 2014-08-20 DIAGNOSIS — Z880 Allergy status to penicillin: Secondary | ICD-10-CM | POA: Diagnosis not present

## 2014-08-20 DIAGNOSIS — R079 Chest pain, unspecified: Secondary | ICD-10-CM | POA: Diagnosis present

## 2014-08-20 DIAGNOSIS — M1991 Primary osteoarthritis, unspecified site: Secondary | ICD-10-CM | POA: Insufficient documentation

## 2014-08-20 DIAGNOSIS — R52 Pain, unspecified: Secondary | ICD-10-CM

## 2014-08-20 LAB — CBC WITH DIFFERENTIAL/PLATELET
Basophils Absolute: 0 10*3/uL (ref 0.0–0.1)
Basophils Relative: 0 % (ref 0–1)
Eosinophils Absolute: 0 10*3/uL (ref 0.0–0.7)
Eosinophils Relative: 1 % (ref 0–5)
HCT: 37 % (ref 36.0–46.0)
Hemoglobin: 12.3 g/dL (ref 12.0–15.0)
LYMPHS ABS: 2.2 10*3/uL (ref 0.7–4.0)
LYMPHS PCT: 37 % (ref 12–46)
MCH: 28 pg (ref 26.0–34.0)
MCHC: 33.2 g/dL (ref 30.0–36.0)
MCV: 84.1 fL (ref 78.0–100.0)
MONO ABS: 0.6 10*3/uL (ref 0.1–1.0)
MONOS PCT: 11 % (ref 3–12)
Neutro Abs: 3 10*3/uL (ref 1.7–7.7)
Neutrophils Relative %: 51 % (ref 43–77)
Platelets: 169 10*3/uL (ref 150–400)
RBC: 4.4 MIL/uL (ref 3.87–5.11)
RDW: 12.4 % (ref 11.5–15.5)
WBC: 5.9 10*3/uL (ref 4.0–10.5)

## 2014-08-20 LAB — URINALYSIS, ROUTINE W REFLEX MICROSCOPIC
BILIRUBIN URINE: NEGATIVE
GLUCOSE, UA: NEGATIVE mg/dL
KETONES UR: NEGATIVE mg/dL
LEUKOCYTES UA: NEGATIVE
Nitrite: NEGATIVE
PH: 7 (ref 5.0–8.0)
PROTEIN: NEGATIVE mg/dL
SPECIFIC GRAVITY, URINE: 1.006 (ref 1.005–1.030)
Urobilinogen, UA: 0.2 mg/dL (ref 0.0–1.0)

## 2014-08-20 LAB — SEDIMENTATION RATE: SED RATE: 22 mm/h (ref 0–22)

## 2014-08-20 LAB — BASIC METABOLIC PANEL
ANION GAP: 11 (ref 5–15)
BUN: 6 mg/dL (ref 6–23)
CHLORIDE: 103 mmol/L (ref 96–112)
CO2: 26 mmol/L (ref 19–32)
Calcium: 10 mg/dL (ref 8.4–10.5)
Creatinine, Ser: 0.51 mg/dL (ref 0.50–1.10)
GFR calc Af Amer: >90 mL/min (ref >90)
GFR calc non Af Amer: >90 mL/min (ref >90)
Glucose, Bld: 118 mg/dL — ABNORMAL HIGH (ref 70–99)
POTASSIUM: 3.2 mmol/L — AB (ref 3.5–5.1)
Sodium: 140 mmol/L (ref 135–145)

## 2014-08-20 LAB — URINE MICROSCOPIC-ADD ON

## 2014-08-20 LAB — I-STAT TROPONIN, ED: Troponin i, poc: 0.01 ng/mL (ref 0.00–0.08)

## 2014-08-20 MED ORDER — POTASSIUM CHLORIDE CRYS ER 20 MEQ PO TBCR
40.0000 meq | EXTENDED_RELEASE_TABLET | Freq: Once | ORAL | Status: AC
  Administered 2014-08-20: 40 meq via ORAL
  Filled 2014-08-20: qty 2

## 2014-08-20 MED ORDER — LORAZEPAM 2 MG/ML IJ SOLN
1.0000 mg | Freq: Once | INTRAMUSCULAR | Status: AC
  Administered 2014-08-20: 1 mg via INTRAVENOUS
  Filled 2014-08-20: qty 1

## 2014-08-20 MED ORDER — ASPIRIN 325 MG PO TABS
325.0000 mg | ORAL_TABLET | ORAL | Status: DC
  Filled 2014-08-20: qty 1

## 2014-08-20 MED ORDER — TRAMADOL HCL 50 MG PO TABS: 50.0000 mg | ORAL_TABLET | Freq: Four times a day (QID) | ORAL | Status: DC | PRN

## 2014-08-20 MED ORDER — KETOROLAC TROMETHAMINE 30 MG/ML IJ SOLN
30.0000 mg | Freq: Once | INTRAMUSCULAR | Status: AC
  Administered 2014-08-20: 30 mg via INTRAVENOUS
  Filled 2014-08-20: qty 1

## 2014-08-20 NOTE — Discharge Instructions (Signed)
Arthritis, Nonspecific °Arthritis is inflammation of a joint. This usually means pain, redness, warmth or swelling are present. One or more joints may be involved. There are a number of types of arthritis. Your caregiver may not be able to tell what type of arthritis you have right away. °CAUSES  °The most common cause of arthritis is the wear and tear on the joint (osteoarthritis). This causes damage to the cartilage, which can break down over time. The knees, hips, back and neck are most often affected by this type of arthritis. °Other types of arthritis and common causes of joint pain include: °· Sprains and other injuries near the joint. Sometimes minor sprains and injuries cause pain and swelling that develop hours later. °· Rheumatoid arthritis. This affects hands, feet and knees. It usually affects both sides of your body at the same time. It is often associated with chronic ailments, fever, weight loss and general weakness. °· Crystal arthritis. Gout and pseudo gout can cause occasional acute severe pain, redness and swelling in the foot, ankle, or knee. °· Infectious arthritis. Bacteria can get into a joint through a break in overlying skin. This can cause infection of the joint. Bacteria and viruses can also spread through the blood and affect your joints. °· Drug, infectious and allergy reactions. Sometimes joints can become mildly painful and slightly swollen with these types of illnesses. °SYMPTOMS  °· Pain is the main symptom. °· Your joint or joints can also be red, swollen and warm or hot to the touch. °· You may have a fever with certain types of arthritis, or even feel overall ill. °· The joint with arthritis will hurt with movement. Stiffness is present with some types of arthritis. °DIAGNOSIS  °Your caregiver will suspect arthritis based on your description of your symptoms and on your exam. Testing may be needed to find the type of arthritis: °· Blood and sometimes urine tests. °· X-ray tests  and sometimes CT or MRI scans. °· Removal of fluid from the joint (arthrocentesis) is done to check for bacteria, crystals or other causes. Your caregiver (or a specialist) will numb the area over the joint with a local anesthetic, and use a needle to remove joint fluid for examination. This procedure is only minimally uncomfortable. °· Even with these tests, your caregiver may not be able to tell what kind of arthritis you have. Consultation with a specialist (rheumatologist) may be helpful. °TREATMENT  °Your caregiver will discuss with you treatment specific to your type of arthritis. If the specific type cannot be determined, then the following general recommendations may apply. °Treatment of severe joint pain includes: °· Rest. °· Elevation. °· Anti-inflammatory medication (for example, ibuprofen) may be prescribed. Avoiding activities that cause increased pain. °· Only take over-the-counter or prescription medicines for pain and discomfort as recommended by your caregiver. °· Cold packs over an inflamed joint may be used for 10 to 15 minutes every hour. Hot packs sometimes feel better, but do not use overnight. Do not use hot packs if you are diabetic without your caregiver's permission. °· A cortisone shot into arthritic joints may help reduce pain and swelling. °· Any acute arthritis that gets worse over the next 1 to 2 days needs to be looked at to be sure there is no joint infection. °Long-term arthritis treatment involves modifying activities and lifestyle to reduce joint stress jarring. This can include weight loss. Also, exercise is needed to nourish the joint cartilage and remove waste. This helps keep the muscles   around the joint strong. °HOME CARE INSTRUCTIONS  °· Do not take aspirin to relieve pain if gout is suspected. This elevates uric acid levels. °· Only take over-the-counter or prescription medicines for pain, discomfort or fever as directed by your caregiver. °· Rest the joint as much as  possible. °· If your joint is swollen, keep it elevated. °· Use crutches if the painful joint is in your leg. °· Drinking plenty of fluids may help for certain types of arthritis. °· Follow your caregiver's dietary instructions. °· Try low-impact exercise such as: °¨ Swimming. °¨ Water aerobics. °¨ Biking. °¨ Walking. °· Morning stiffness is often relieved by a warm shower. °· Put your joints through regular range-of-motion. °SEEK MEDICAL CARE IF:  °· You do not feel better in 24 hours or are getting worse. °· You have side effects to medications, or are not getting better with treatment. °SEEK IMMEDIATE MEDICAL CARE IF:  °· You have a fever. °· You develop severe joint pain, swelling or redness. °· Many joints are involved and become painful and swollen. °· There is severe back pain and/or leg weakness. °· You have loss of bowel or bladder control. °Document Released: 06/27/2004 Document Revised: 08/12/2011 Document Reviewed: 07/13/2008 °[employer]® Patient Information ©2015 [employer]. This information is not intended to replace advice given to you by your health care provider. Make sure you discuss any questions you have with your health care provider. ° °

## 2014-08-20 NOTE — ED Notes (Signed)
Dr. Tamera Punt made aware and pt's c/o feeling weak and having no appetite. She sts pt's work up here is unremarkable and she should f/u with PCP as she was under the impression that pt was here for joint pain as that is what pt relayed to her during her assessment. Pt's family requesting outpt psychology resources as they feel this the source of pt's concerns and issues. Alice, CSW made aware and in to see pt, given insurance specific resources. Pt and family express gratefulness.

## 2014-08-20 NOTE — ED Notes (Signed)
MD at bedside. 

## 2014-08-20 NOTE — ED Notes (Addendum)
Pt. REFUSED aspirin, pt. Stated that she used to take aspirin  daily until was told by her Doctor to stop it years ago. Pt. Denies CHEST PAIN but claimed " just PRESSURE like ." pt. Is very anxious . Able to get up to commode without difficulty.

## 2014-08-20 NOTE — ED Notes (Signed)
Pt reports she has not been feeling well the past couple of days with a loss of appetite. Pt reports hx of heart valve dysfunction. Pt reports being around grandchild that had the flu. Pt centralized chest pain began upon ED arrival and is rated a 10/10.

## 2014-08-20 NOTE — ED Provider Notes (Signed)
Results for orders placed or performed during the hospital encounter of 60/63/01  Basic metabolic panel  Result Value Ref Range   Sodium 140 135 - 145 mmol/L   Potassium 3.2 (L) 3.5 - 5.1 mmol/L   Chloride 103 96 - 112 mmol/L   CO2 26 19 - 32 mmol/L   Glucose, Bld 118 (H) 70 - 99 mg/dL   BUN 6 6 - 23 mg/dL   Creatinine, Ser 0.51 0.50 - 1.10 mg/dL   Calcium 10.0 8.4 - 10.5 mg/dL   GFR calc non Af Amer >90 >90 mL/min   GFR calc Af Amer >90 >90 mL/min   Anion gap 11 5 - 15  CBC with Differential  Result Value Ref Range   WBC 5.9 4.0 - 10.5 K/uL   RBC 4.40 3.87 - 5.11 MIL/uL   Hemoglobin 12.3 12.0 - 15.0 g/dL   HCT 37.0 36.0 - 46.0 %   MCV 84.1 78.0 - 100.0 fL   MCH 28.0 26.0 - 34.0 pg   MCHC 33.2 30.0 - 36.0 g/dL   RDW 12.4 11.5 - 15.5 %   Platelets 169 150 - 400 K/uL   Neutrophils Relative % 51 43 - 77 %   Neutro Abs 3.0 1.7 - 7.7 K/uL   Lymphocytes Relative 37 12 - 46 %   Lymphs Abs 2.2 0.7 - 4.0 K/uL   Monocytes Relative 11 3 - 12 %   Monocytes Absolute 0.6 0.1 - 1.0 K/uL   Eosinophils Relative 1 0 - 5 %   Eosinophils Absolute 0.0 0.0 - 0.7 K/uL   Basophils Relative 0 0 - 1 %   Basophils Absolute 0.0 0.0 - 0.1 K/uL  Urinalysis, Routine w reflex microscopic  Result Value Ref Range   Color, Urine YELLOW YELLOW   APPearance CLEAR CLEAR   Specific Gravity, Urine 1.006 1.005 - 1.030   pH 7.0 5.0 - 8.0   Glucose, UA NEGATIVE NEGATIVE mg/dL   Hgb urine dipstick SMALL (A) NEGATIVE   Bilirubin Urine NEGATIVE NEGATIVE   Ketones, ur NEGATIVE NEGATIVE mg/dL   Protein, ur NEGATIVE NEGATIVE mg/dL   Urobilinogen, UA 0.2 0.0 - 1.0 mg/dL   Nitrite NEGATIVE NEGATIVE   Leukocytes, UA NEGATIVE NEGATIVE  Sedimentation rate  Result Value Ref Range   Sed Rate 22 0 - 22 mm/hr  Urine microscopic-add on  Result Value Ref Range   Squamous Epithelial / LPF RARE RARE   RBC / HPF 3-6 <3 RBC/hpf  I-stat troponin, ED (not at [facility])  Result Value Ref Range   Troponin i, poc 0.01 0.00 - 0.08  ng/mL   Comment 3           Dg Chest 1 View  08/20/2014   CLINICAL DATA:  Initial evaluation for weakness and left shoulder and knee pain for 3 days with no known injury  EXAM: CHEST  1 VIEW  COMPARISON:  12/02/2013  FINDINGS: Heart size and vascular pattern are normal. Lungs are clear allowing for rotation. 1 cm pulmonary nodule lateral right middle lobe stable. This is also stable from 2006. Severe left shoulder arthritis.  IMPRESSION: Stable granuloma right lung.  No acute findings.   Electronically Signed   By: Skipper Cliche M.D.   On: 08/20/2014 08:10   Dg Shoulder Left  08/20/2014   CLINICAL DATA:  Left shoulder pain and weakness, no known injury, initial encounter  EXAM: LEFT SHOULDER - 2+ VIEW  COMPARISON:  11/24/2003  FINDINGS: No acute fracture or dislocation is noted.  Considerable remodeling of the humeral head is noted similar to that seen on prior examination. No soft tissue abnormality is noted.  IMPRESSION: No acute abnormality is noted. Significant degenerative changes identified.   Electronically Signed   By: Inez Catalina M.D.   On: 08/20/2014 08:13   Dg Knee Complete 4 Views Left  08/20/2014   CLINICAL DATA:  Left knee pain.  No known injury.  EXAM: LEFT KNEE - COMPLETE 4+ VIEW  COMPARISON:  01/17/2011  FINDINGS: The patient has progressive tricompartmental osteoarthritis. There is a small joint effusion. There progressive marginal osteophytes in the patellofemoral and medial compartment with new marginal osteophytes in the lateral compartment.  No acute osseous abnormality.  IMPRESSION: Progressive tricompartmental osteoarthritis with a small joint effusion.   Electronically Signed   By: Lorriane Shire M.D.   On: 08/20/2014 08:07    Patient presents with joint pain. She was initially seen by Dr. Roxanne Mins and was awaiting x-rays. Her x-rays show degenerative joint disease. This is consistent with her past flareups of her arthritis. There is no evidence of infectious etiologies. She has  a history of substance abuse in the past. She was given prescription for Ultram for pain management was encouraged to follow-up with her primary care physician or orthopedist.  Malvin Johns, MD 08/20/14 [id number]

## 2014-08-20 NOTE — ED Provider Notes (Signed)
CSN: [id number]     Arrival date & time 08/20/14  [id number] History   First MD Initiated Contact with Patient 08/20/14 0557     Chief Complaint  Patient presents with  . Chest Pain     (Consider location/radiation/quality/duration/timing/severity/associated sxs/prior Treatment) Patient is a 69 y.o. female presenting with chest pain. The history is provided by the patient.  Chest Pain She is actually complaining of joint pain-especially pain in her left shoulder and left knee. She has had pain in these joints for years and has seen pain specialist and orthopedic physicians. She has been treated with intra-articular injections which only give temporary relief. She is currently not taking anything for pain. In past, she has been given hydrocodone-acetaminophen which does give temporary relief. She is agitated and tearful and complaining that people are not giving her what she needs. She states that she does have history of drug abuse in the past and is aware that that may influence physicians guarding narcotic prescriptions. She actually has no chest related complaints. She does rate her pain at 10/10. Pain is worse with movement but nothing makes it any better.  Past Medical History  Diagnosis Date  . Arthritis   . Hypertension   . Hepatitis C   . DDD (degenerative disc disease), lumbar   . DDD (degenerative disc disease), cervical    Past Surgical History  Procedure Laterality Date  . Abdominal hysterectomy    . Cesarean section    . Back surgery    . Appendectomy     Family History  Problem Relation Age of Onset  . Heart disease Mother   . Kidney disease Mother   . Alcohol abuse Mother    History  Substance Use Topics  . Smoking status: Former Smoker    Quit date: 11/01/2008  . Smokeless tobacco: Not on file  . Alcohol Use: No   OB History    No data available     Review of Systems  Cardiovascular: Positive for chest pain.  All other systems reviewed and are  negative.     Allergies  Penicillins  Home Medications   Prior to Admission medications   Medication Sig Start Date End Date Taking? Authorizing Provider  amLODipine (NORVASC) 5 MG tablet Take 1 tablet (5 mg total) by mouth daily. 05/29/12   Oswald Hillock, MD  colchicine 0.6 MG tablet Take 0.6 mg by mouth daily.    Historical Provider, MD  docusate sodium (COLACE) 100 MG capsule Take 100 mg by mouth 2 (two) times daily.    Historical Provider, MD  HYDROcodone-acetaminophen (NORCO) 5-325 MG per tablet Take 1-2 tablets by mouth every 6 (six) hours as needed. 03/31/14   Elnora Morrison, MD  lisinopril (PRINIVIL,ZESTRIL) 40 MG tablet Take 1 tablet (40 mg total) by mouth daily. 05/29/12   Oswald Hillock, MD  traMADol (ULTRAM) 50 MG tablet Take 1 tablet (50 mg total) by mouth 3 (three) times daily. 04/18/14   Charlett Blake, MD   BP 185/94 mmHg  Pulse 89  Temp(Src) 97.9 F (36.6 C) (Oral)  Resp 16  SpO2 97% Physical Exam  Nursing note and vitals reviewed.  69 year old female, resting comfortably and in no acute distress. Vital signs are significant for hypertension. Oxygen saturation is 97%, which is normal. She is very emotionally labile and frequently tearful. Head is normocephalic and atraumatic. PERRLA, EOMI. Oropharynx is clear. Neck is nontender and supple without adenopathy or JVD. Back is nontender and there is  no CVA tenderness. Lungs are clear without rales, wheezes, or rhonchi. Chest is nontender. Heart has regular rate and rhythm without murmur. Abdomen is soft, flat, nontender without masses or hepatosplenomegaly and peristalsis is normoactive. Extremities have no cyanosis or edema. Left shoulder and left knee have mild tenderness to palpation and moderate pain with passive range of motion. Skin is warm and dry without rash. Neurologic: Mental status is normal, cranial nerves are intact, there are no motor or sensory deficits.  ED Course  Procedures (including critical  care time) Labs Review Results for orders placed or performed during the hospital encounter of 41/66/06  Basic metabolic panel  Result Value Ref Range   Sodium 140 135 - 145 mmol/L   Potassium 3.2 (L) 3.5 - 5.1 mmol/L   Chloride 103 96 - 112 mmol/L   CO2 26 19 - 32 mmol/L   Glucose, Bld 118 (H) 70 - 99 mg/dL   BUN 6 6 - 23 mg/dL   Creatinine, Ser 0.51 0.50 - 1.10 mg/dL   Calcium 10.0 8.4 - 10.5 mg/dL   GFR calc non Af Amer >90 >90 mL/min   GFR calc Af Amer >90 >90 mL/min   Anion gap 11 5 - 15  CBC with Differential  Result Value Ref Range   WBC 5.9 4.0 - 10.5 K/uL   RBC 4.40 3.87 - 5.11 MIL/uL   Hemoglobin 12.3 12.0 - 15.0 g/dL   HCT 37.0 36.0 - 46.0 %   MCV 84.1 78.0 - 100.0 fL   MCH 28.0 26.0 - 34.0 pg   MCHC 33.2 30.0 - 36.0 g/dL   RDW 12.4 11.5 - 15.5 %   Platelets 169 150 - 400 K/uL   Neutrophils Relative % 51 43 - 77 %   Neutro Abs 3.0 1.7 - 7.7 K/uL   Lymphocytes Relative 37 12 - 46 %   Lymphs Abs 2.2 0.7 - 4.0 K/uL   Monocytes Relative 11 3 - 12 %   Monocytes Absolute 0.6 0.1 - 1.0 K/uL   Eosinophils Relative 1 0 - 5 %   Eosinophils Absolute 0.0 0.0 - 0.7 K/uL   Basophils Relative 0 0 - 1 %   Basophils Absolute 0.0 0.0 - 0.1 K/uL  Urinalysis, Routine w reflex microscopic  Result Value Ref Range   Color, Urine YELLOW YELLOW   APPearance CLEAR CLEAR   Specific Gravity, Urine 1.006 1.005 - 1.030   pH 7.0 5.0 - 8.0   Glucose, UA NEGATIVE NEGATIVE mg/dL   Hgb urine dipstick SMALL (A) NEGATIVE   Bilirubin Urine NEGATIVE NEGATIVE   Ketones, ur NEGATIVE NEGATIVE mg/dL   Protein, ur NEGATIVE NEGATIVE mg/dL   Urobilinogen, UA 0.2 0.0 - 1.0 mg/dL   Nitrite NEGATIVE NEGATIVE   Leukocytes, UA NEGATIVE NEGATIVE  Sedimentation rate  Result Value Ref Range   Sed Rate 22 0 - 22 mm/hr  Urine microscopic-add on  Result Value Ref Range   Squamous Epithelial / LPF RARE RARE   RBC / HPF 3-6 <3 RBC/hpf  I-stat troponin, ED (not at [facility])  Result Value Ref Range   Troponin  i, poc 0.01 0.00 - 0.08 ng/mL   Comment 3           Imaging Review Dg Chest 1 View  08/20/2014   CLINICAL DATA:  Initial evaluation for weakness and left shoulder and knee pain for 3 days with no known injury  EXAM: CHEST  1 VIEW  COMPARISON:  12/02/2013  FINDINGS: Heart size and vascular  pattern are normal. Lungs are clear allowing for rotation. 1 cm pulmonary nodule lateral right middle lobe stable. This is also stable from 2006. Severe left shoulder arthritis.  IMPRESSION: Stable granuloma right lung.  No acute findings.   Electronically Signed   By: Skipper Cliche M.D.   On: 08/20/2014 08:10   Dg Shoulder Left  08/20/2014   CLINICAL DATA:  Left shoulder pain and weakness, no known injury, initial encounter  EXAM: LEFT SHOULDER - 2+ VIEW  COMPARISON:  11/24/2003  FINDINGS: No acute fracture or dislocation is noted. Considerable remodeling of the humeral head is noted similar to that seen on prior examination. No soft tissue abnormality is noted.  IMPRESSION: No acute abnormality is noted. Significant degenerative changes identified.   Electronically Signed   By: Inez Catalina M.D.   On: 08/20/2014 08:13   Dg Knee Complete 4 Views Left  08/20/2014   CLINICAL DATA:  Left knee pain.  No known injury.  EXAM: LEFT KNEE - COMPLETE 4+ VIEW  COMPARISON:  01/17/2011  FINDINGS: The patient has progressive tricompartmental osteoarthritis. There is a small joint effusion. There progressive marginal osteophytes in the patellofemoral and medial compartment with new marginal osteophytes in the lateral compartment.  No acute osseous abnormality.  IMPRESSION: Progressive tricompartmental osteoarthritis with a small joint effusion.   Electronically Signed   By: Lorriane Shire M.D.   On: 08/20/2014 08:07     EKG Interpretation   Date/Time:  Saturday August 20 2014 05:32:05 EDT Ventricular Rate:  107 PR Interval:  171 QRS Duration: 82 QT Interval:  364 QTC Calculation: 486 R Axis:   22 Text Interpretation:   Sinus tachycardia Abnormal R-wave progression, early  transition Borderline prolonged QT interval Baseline wander in lead(s) III  When compared with ECG of 03/31/2014, No significant change was found  Confirmed by [facility]  MD, Cindel Daugherty ([id number]) on 08/20/2014 5:45:29 AM      MDM   Final diagnoses:  Pain  Primary osteoarthritis involving multiple joints    Chronic pain in left shoulder and knee. Review of past records shows that she has been going to physical medicine specialist who has given her injections with only temporary relief. I do not see any recent x-rays of her shoulder or knee so x-rays will be obtained. She will be given a trial of ketorolac in the ED. She is markedly agitated and given dose of lorazepam.  Laboratory workup is significant only for mild hypokalemia. X-rays are pending. Case is signed out to Dr. Tamera Punt.  Delora Fuel, MD 99/35/70 [id number]
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
## 2014-08-20 NOTE — ED Notes (Signed)
Pt. On cardiac monitor. 

## 2014-09-05 ENCOUNTER — Other Ambulatory Visit: Payer: Self-pay | Admitting: Orthopaedic Surgery

## 2014-09-05 DIAGNOSIS — M25512 Pain in left shoulder: Secondary | ICD-10-CM

## 2014-09-16 ENCOUNTER — Ambulatory Visit
Admission: RE | Admit: 2014-09-16 | Discharge: 2014-09-16 | Disposition: A | Discharge status: Home / Self Care | Payer: Commercial Managed Care - HMO | Source: Ambulatory Visit | Attending: Orthopaedic Surgery | Admitting: Orthopaedic Surgery

## 2014-09-16 DIAGNOSIS — M25512 Pain in left shoulder: Secondary | ICD-10-CM

## 2014-09-29 ENCOUNTER — Encounter (HOSPITAL_COMMUNITY): Payer: Self-pay | Admitting: Psychiatry

## 2014-09-29 ENCOUNTER — Ambulatory Visit (INDEPENDENT_AMBULATORY_CARE_PROVIDER_SITE_OTHER): Payer: Self-pay | Admitting: Psychiatry

## 2014-09-29 VITALS — BP 154/84 | HR 74 | Ht 62.0 in | Wt 163.8 lb

## 2014-09-29 DIAGNOSIS — F4321 Adjustment disorder with depressed mood: Secondary | ICD-10-CM | POA: Insufficient documentation

## 2014-09-29 DIAGNOSIS — F1421 Cocaine dependence, in remission: Secondary | ICD-10-CM | POA: Insufficient documentation

## 2014-09-29 DIAGNOSIS — F1210 Cannabis abuse, uncomplicated: Secondary | ICD-10-CM | POA: Insufficient documentation

## 2014-09-29 DIAGNOSIS — F1110 Opioid abuse, uncomplicated: Secondary | ICD-10-CM | POA: Insufficient documentation

## 2014-09-29 DIAGNOSIS — F1121 Opioid dependence, in remission: Secondary | ICD-10-CM | POA: Insufficient documentation

## 2014-09-29 MED ORDER — DULOXETINE HCL 30 MG PO CPEP: 30.0000 mg | ORAL_CAPSULE | Freq: Every day | ORAL | Status: DC
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
## 2014-10-04 ENCOUNTER — Other Ambulatory Visit: Payer: Self-pay | Admitting: Nurse Practitioner

## 2014-10-04 DIAGNOSIS — C220 Liver cell carcinoma: Secondary | ICD-10-CM

## 2014-10-10 ENCOUNTER — Other Ambulatory Visit: Payer: Self-pay

## 2014-10-14 ENCOUNTER — Ambulatory Visit
Admission: RE | Admit: 2014-10-14 | Discharge: 2014-10-14 | Disposition: A | Discharge status: Home / Self Care | Payer: Commercial Managed Care - HMO | Source: Ambulatory Visit | Attending: Nurse Practitioner | Admitting: Nurse Practitioner

## 2014-10-14 DIAGNOSIS — C220 Liver cell carcinoma: Secondary | ICD-10-CM

## 2014-10-20 ENCOUNTER — Ambulatory Visit (HOSPITAL_COMMUNITY): Payer: Self-pay | Admitting: Clinical

## 2014-11-17 ENCOUNTER — Ambulatory Visit (HOSPITAL_COMMUNITY): Payer: Self-pay | Admitting: Psychiatry

## 2014-12-01 ENCOUNTER — Other Ambulatory Visit (HOSPITAL_COMMUNITY): Payer: Self-pay

## 2014-12-19 ENCOUNTER — Encounter (HOSPITAL_COMMUNITY)
Admission: RE | Admit: 2014-12-19 | Discharge: 2014-12-19 | Disposition: A | Discharge status: Home / Self Care | Payer: Medicare HMO | Source: Ambulatory Visit | Attending: Orthopaedic Surgery | Admitting: Orthopaedic Surgery

## 2014-12-19 ENCOUNTER — Ambulatory Visit (HOSPITAL_COMMUNITY)
Admission: RE | Admit: 2014-12-19 | Discharge: 2014-12-19 | Disposition: A | Discharge status: Home / Self Care | Payer: Medicare HMO | Source: Ambulatory Visit | Attending: Orthopedic Surgery | Admitting: Orthopedic Surgery

## 2014-12-19 ENCOUNTER — Encounter (HOSPITAL_COMMUNITY): Payer: Self-pay

## 2014-12-19 DIAGNOSIS — R911 Solitary pulmonary nodule: Secondary | ICD-10-CM | POA: Insufficient documentation

## 2014-12-19 DIAGNOSIS — Z01818 Encounter for other preprocedural examination: Secondary | ICD-10-CM

## 2014-12-19 DIAGNOSIS — M19012 Primary osteoarthritis, left shoulder: Secondary | ICD-10-CM | POA: Insufficient documentation

## 2014-12-19 DIAGNOSIS — Z0183 Encounter for blood typing: Secondary | ICD-10-CM | POA: Diagnosis not present

## 2014-12-19 DIAGNOSIS — Z01812 Encounter for preprocedural laboratory examination: Secondary | ICD-10-CM | POA: Diagnosis not present

## 2014-12-19 HISTORY — DX: Cardiac murmur, unspecified: R01.1

## 2014-12-19 HISTORY — DX: Anxiety disorder, unspecified: F41.9

## 2014-12-19 HISTORY — DX: Constipation, unspecified: K59.00

## 2014-12-19 HISTORY — DX: Pneumonia, unspecified organism: J18.9

## 2014-12-19 LAB — CBC WITH DIFFERENTIAL/PLATELET
BASOS ABS: 0 10*3/uL (ref 0.0–0.1)
Basophils Relative: 0 % (ref 0–1)
EOS ABS: 0.1 10*3/uL (ref 0.0–0.7)
Eosinophils Relative: 2 % (ref 0–5)
HCT: 34.2 % — ABNORMAL LOW (ref 36.0–46.0)
Hemoglobin: 11.1 g/dL — ABNORMAL LOW (ref 12.0–15.0)
Lymphocytes Relative: 47 % — ABNORMAL HIGH (ref 12–46)
Lymphs Abs: 2.1 10*3/uL (ref 0.7–4.0)
MCH: 26.9 pg (ref 26.0–34.0)
MCHC: 32.5 g/dL (ref 30.0–36.0)
MCV: 83 fL (ref 78.0–100.0)
MONOS PCT: 10 % (ref 3–12)
Monocytes Absolute: 0.4 10*3/uL (ref 0.1–1.0)
NEUTROS PCT: 41 % — AB (ref 43–77)
Neutro Abs: 1.8 10*3/uL (ref 1.7–7.7)
Platelets: 174 10*3/uL (ref 150–400)
RBC: 4.12 MIL/uL (ref 3.87–5.11)
RDW: 12.9 % (ref 11.5–15.5)
WBC: 4.5 10*3/uL (ref 4.0–10.5)

## 2014-12-19 LAB — COMPREHENSIVE METABOLIC PANEL
ALBUMIN: 4 g/dL (ref 3.5–5.0)
ALT: 15 U/L (ref 14–54)
ANION GAP: 8 (ref 5–15)
AST: 25 U/L (ref 15–41)
Alkaline Phosphatase: 96 U/L (ref 38–126)
BILIRUBIN TOTAL: 0.4 mg/dL (ref 0.3–1.2)
CO2: 27 mmol/L (ref 22–32)
Calcium: 9.9 mg/dL (ref 8.9–10.3)
Chloride: 103 mmol/L (ref 101–111)
Creatinine, Ser: 0.55 mg/dL (ref 0.44–1.00)
GFR calc non Af Amer: >60 mL/min (ref >60)
GLUCOSE: 84 mg/dL (ref 65–99)
Potassium: 3.9 mmol/L (ref 3.5–5.1)
Sodium: 138 mmol/L (ref 135–145)
TOTAL PROTEIN: 7.9 g/dL (ref 6.5–8.1)

## 2014-12-19 LAB — SURGICAL PCR SCREEN
MRSA, PCR: NEGATIVE
Staphylococcus aureus: NEGATIVE

## 2014-12-19 LAB — TYPE AND SCREEN
ABO/RH(D): O POS
Antibody Screen: NEGATIVE

## 2014-12-19 LAB — APTT: APTT: 30 s (ref 24–37)

## 2014-12-19 LAB — PROTIME-INR
INR: 1.12 (ref 0.00–1.49)
Prothrombin Time: 14.6 seconds (ref 11.6–15.2)

## 2014-12-19 LAB — ABO/RH: ABO/RH(D): O POS

## 2014-12-19 NOTE — Progress Notes (Addendum)
Ashley Riddle denies chest pain or shortness of breath.  Dr Rudi Rummage is patient's PCP, patient has a heart murmer, "nothing for her to worry about."  Had an echo 11/15- showed mitral regurgition and mild aortic stenosis.  Patient states that she smokes mariajuana  once a week.  "I haven't done any of drugs since 1992."

## 2014-12-19 NOTE — Pre-Procedure Instructions (Signed)
Ashley Riddle  12/19/2014      [id number] [place], [place] [address] [place] [address] Phone: [phone] Fax: [phone]    Your procedure is scheduled on   Tuesday 12/27/14  Report to [facility] Admitting at  800 A.M.  Call this number if you have problems the morning of surgery:  [phone]   Remember:  Do not eat food or drink liquids after midnight.  Take these medicines the morning of surgery with A SIP OF WATER   AMLODIPINE, COLCHICINE, DULOXETINE(CYMBALTA), HYDROCODONE OR ULTRAM IF NEEDED   Do not wear jewelry, make-up or nail polish.  Do not wear lotions, powders, or perfumes.  You may wear deodorant.  Do not shave 48 hours prior to surgery.  Men may shave face and neck.  Do not bring valuables to the hospital.  [facility] is not responsible for any belongings or valuables.  Contacts, dentures or bridgework may not be worn into surgery.  Leave your suitcase in the car.  After surgery it may be brought to your room.  For patients admitted to the hospital, discharge time will be determined by your treatment team.  Patients discharged the day of surgery will not be allowed to drive home.   Name and phone number of your driver:    Special instructions:  Ashley Riddle - Preparing for Surgery  Before surgery, you can play an important role.  Because skin is not sterile, your skin needs to be as free of germs as possible.  You can reduce the number of germs on you skin by washing with CHG (chlorahexidine gluconate) soap before surgery.  CHG is an antiseptic cleaner which kills germs and bonds with the skin to continue killing germs even after washing.  Please DO NOT use if you have an allergy to CHG or antibacterial soaps.  If your skin becomes reddened/irritated stop using the CHG and inform your nurse when you arrive at Short Stay.  Do not shave (including legs and  underarms) for at least 48 hours prior to the first CHG shower.  You may shave your face.  Please follow these instructions carefully:   1.  Shower with CHG Soap the night before surgery and the                                morning of Surgery.  2.  If you choose to wash your hair, wash your hair first as usual with your       normal shampoo.  3.  After you shampoo, rinse your hair and body thoroughly to remove the                      Shampoo.  4.  Use CHG as you would any other liquid soap.  You can apply chg directly       to the skin and wash gently with scrungie or a clean washcloth.  5.  Apply the CHG Soap to your body ONLY FROM THE NECK DOWN.        Do not use on open wounds or open sores.  Avoid contact with your eyes,       ears, mouth and genitals (private parts).  Wash genitals (private parts)       with your normal soap.  6.  Wash thoroughly,  paying special attention to the area where your surgery        will be performed.  7.  Thoroughly rinse your body with warm water from the neck down.  8.  DO NOT shower/wash with your normal soap after using and rinsing off       the CHG Soap.  9.  Pat yourself dry with a clean towel.            10.  Wear clean pajamas.            11.  Place clean sheets on your bed the night of your first shower and do not        sleep with pets.  Day of Surgery  Do not apply any lotions/deoderants the morning of surgery.  Please wear clean clothes to the hospital/surgery center.    Please read over the following fact sheets that you were given. Pain Booklet, Coughing and Deep Breathing, Blood Transfusion Information, MRSA Information and Surgical Site Infection Prevention

## 2014-12-20 ENCOUNTER — Encounter (HOSPITAL_COMMUNITY): Payer: Self-pay

## 2014-12-20 LAB — URINE CULTURE: Culture: NO GROWTH

## 2014-12-20 NOTE — Progress Notes (Signed)
Anesthesia Chart Review: Patient is a 69 year old female scheduled for left hemi- versus total shoulder replacement on 12/27/14 by Dr. Durward Fortes.  History includes former smoker, murmur (probable bicuspid AV with mild AS, mild MR by 04/2014 echo), HTN, hepatitis C, anxiety, arthritis, hysterectomy, back surgery, tonsillectomy, appendectomy. BMI is consistent with obesity. PCP is listed as Dr. Wallene Huh (cardiologist).   08/11/14 EKG: ST at 107 bpm, abnormal r wave progression-early transition, borderline prolonged QT interval, baseline wanderer in lead III.   04/22/14 Echo: Study Conclusions - Left ventricle: The cavity size was normal. Systolic function was normal. The estimated ejection fraction was in the range of 55% to 60%. Wall motion was normal; there were no regional wall motion abnormalities. Left ventricular diastolic function parameters were normal. - Aortic valve: Probably bicuspid. There was mild stenosis. Valve area (VTI): 1.89 cm^2. Valve area (Vmax): 1.61 cm^2. Valve area (Vmean): 1.98 cm^2. - Mitral valve: There was mild regurgitation. - Atrial septum: No defect or patent foramen ovale was identified.  01/20/14 Carotid duplex: Summary: Findings suggest 1-39% internal carotid artery stenosis. Verterbal arteries are patent with antegrade flow.  12/19/14 CXR: IMPRESSION: 1. No acute cardiopulmonary disease. 2. Stable calcified pulmonary nodule right mid lung most consistent with calcified granuloma or hamartoma.  Preoperative labs noted.   If no acute changes then I anticipate that she can proceed as planned.  [facility] Short Stay Center/Anesthesiology Phone [phone] 12/20/2014 5:12 PM

## 2014-12-27 ENCOUNTER — Encounter (HOSPITAL_COMMUNITY): Payer: Self-pay | Admitting: *Deleted

## 2014-12-27 ENCOUNTER — Encounter (HOSPITAL_COMMUNITY)
Admission: AD | Disposition: A | Discharge status: Home / Self Care | Payer: Self-pay | Source: Ambulatory Visit | Attending: Orthopaedic Surgery

## 2014-12-27 ENCOUNTER — Inpatient Hospital Stay (HOSPITAL_COMMUNITY): Payer: Medicare HMO | Admitting: Certified Registered Nurse Anesthetist

## 2014-12-27 ENCOUNTER — Inpatient Hospital Stay (HOSPITAL_COMMUNITY): Payer: Medicare HMO | Admitting: Vascular Surgery

## 2014-12-27 ENCOUNTER — Inpatient Hospital Stay (HOSPITAL_COMMUNITY)
Admission: AD | Admit: 2014-12-27 | Discharge: 2014-12-29 | DRG: 483 | Disposition: A | Discharge status: Home / Self Care | Payer: Medicare HMO | Source: Ambulatory Visit | Attending: Orthopaedic Surgery | Admitting: Orthopaedic Surgery

## 2014-12-27 DIAGNOSIS — Z87891 Personal history of nicotine dependence: Secondary | ICD-10-CM

## 2014-12-27 DIAGNOSIS — M19012 Primary osteoarthritis, left shoulder: Secondary | ICD-10-CM | POA: Diagnosis present

## 2014-12-27 DIAGNOSIS — Z880 Allergy status to penicillin: Secondary | ICD-10-CM | POA: Diagnosis not present

## 2014-12-27 DIAGNOSIS — J45909 Unspecified asthma, uncomplicated: Secondary | ICD-10-CM | POA: Diagnosis present

## 2014-12-27 DIAGNOSIS — B1920 Unspecified viral hepatitis C without hepatic coma: Secondary | ICD-10-CM | POA: Diagnosis present

## 2014-12-27 DIAGNOSIS — G4730 Sleep apnea, unspecified: Secondary | ICD-10-CM | POA: Diagnosis present

## 2014-12-27 DIAGNOSIS — D62 Acute posthemorrhagic anemia: Secondary | ICD-10-CM | POA: Diagnosis not present

## 2014-12-27 DIAGNOSIS — M109 Gout, unspecified: Secondary | ICD-10-CM | POA: Diagnosis present

## 2014-12-27 DIAGNOSIS — I10 Essential (primary) hypertension: Secondary | ICD-10-CM | POA: Diagnosis present

## 2014-12-27 DIAGNOSIS — S42142A Displaced fracture of glenoid cavity of scapula, left shoulder, initial encounter for closed fracture: Secondary | ICD-10-CM | POA: Diagnosis not present

## 2014-12-27 DIAGNOSIS — Y92234 Operating room of hospital as the place of occurrence of the external cause: Secondary | ICD-10-CM

## 2014-12-27 DIAGNOSIS — M9669 Fracture of other bone following insertion of orthopedic implant, joint prosthesis, or bone plate: Secondary | ICD-10-CM | POA: Diagnosis not present

## 2014-12-27 DIAGNOSIS — M25512 Pain in left shoulder: Secondary | ICD-10-CM | POA: Diagnosis present

## 2014-12-27 DIAGNOSIS — Z96619 Presence of unspecified artificial shoulder joint: Secondary | ICD-10-CM

## 2014-12-27 DIAGNOSIS — Y838 Other surgical procedures as the cause of abnormal reaction of the patient, or of later complication, without mention of misadventure at the time of the procedure: Secondary | ICD-10-CM | POA: Diagnosis not present

## 2014-12-27 HISTORY — PX: TOTAL SHOULDER ARTHROPLASTY: SHX126

## 2014-12-27 SURGERY — ARTHROPLASTY, SHOULDER, TOTAL
Anesthesia: General | Site: Shoulder | Laterality: Left

## 2014-12-27 MED ORDER — ACETAMINOPHEN 10 MG/ML IV SOLN
1000.0000 mg | Freq: Once | INTRAVENOUS | Status: AC
  Administered 2014-12-27: 1000 mg via INTRAVENOUS

## 2014-12-27 MED ORDER — MIDAZOLAM HCL 2 MG/2ML IJ SOLN
INTRAMUSCULAR | Status: AC
  Filled 2014-12-27: qty 2

## 2014-12-27 MED ORDER — ROPIVACAINE HCL 5 MG/ML IJ SOLN
INTRAMUSCULAR | Status: DC | PRN
  Administered 2014-12-27: 30 mg via PERINEURAL

## 2014-12-27 MED ORDER — MAGNESIUM CITRATE PO SOLN: 1.0000 | Freq: Once | ORAL | Status: AC | PRN

## 2014-12-27 MED ORDER — SCOPOLAMINE 1 MG/3DAYS TD PT72
1.0000 | MEDICATED_PATCH | TRANSDERMAL | Status: DC
  Administered 2014-12-27: 1.5 mg via TRANSDERMAL
  Filled 2014-12-27 (×2): qty 1

## 2014-12-27 MED ORDER — SODIUM CHLORIDE 0.9 % IV SOLN
INTRAVENOUS | Status: DC
  Administered 2014-12-28: 03:00:00 via INTRAVENOUS

## 2014-12-27 MED ORDER — PROMETHAZINE HCL 25 MG/ML IJ SOLN: 6.2500 mg | INTRAMUSCULAR | Status: DC | PRN

## 2014-12-27 MED ORDER — ONDANSETRON HCL 4 MG/2ML IJ SOLN
INTRAMUSCULAR | Status: DC | PRN
  Administered 2014-12-27: 4 mg via INTRAVENOUS

## 2014-12-27 MED ORDER — PHENYLEPHRINE HCL 10 MG/ML IJ SOLN
INTRAMUSCULAR | Status: DC | PRN
  Administered 2014-12-27 (×2): 80 ug via INTRAVENOUS
  Administered 2014-12-27: 40 ug via INTRAVENOUS

## 2014-12-27 MED ORDER — FENTANYL CITRATE (PF) 100 MCG/2ML IJ SOLN
INTRAMUSCULAR | Status: DC | PRN
  Administered 2014-12-27: 100 ug via INTRAVENOUS
  Administered 2014-12-27 (×3): 50 ug via INTRAVENOUS
  Administered 2014-12-27: 100 ug via INTRAVENOUS

## 2014-12-27 MED ORDER — MENTHOL 3 MG MT LOZG: 1.0000 | LOZENGE | OROMUCOSAL | Status: DC | PRN

## 2014-12-27 MED ORDER — DEXTROSE 5 % IV SOLN
10.0000 mg | INTRAVENOUS | Status: DC | PRN
  Administered 2014-12-27: 10 ug/min via INTRAVENOUS

## 2014-12-27 MED ORDER — PROPOFOL 10 MG/ML IV BOLUS
INTRAVENOUS | Status: DC | PRN
  Administered 2014-12-27: 200 mg via INTRAVENOUS

## 2014-12-27 MED ORDER — METOCLOPRAMIDE HCL 5 MG/ML IJ SOLN: 5.0000 mg | Freq: Three times a day (TID) | INTRAMUSCULAR | Status: DC | PRN

## 2014-12-27 MED ORDER — HYDROMORPHONE HCL 1 MG/ML IJ SOLN
0.5000 mg | INTRAMUSCULAR | Status: DC | PRN
  Administered 2014-12-27 – 2014-12-29 (×11): 1 mg via INTRAVENOUS
  Filled 2014-12-27 (×11): qty 1

## 2014-12-27 MED ORDER — POLYETHYLENE GLYCOL 3350 17 G PO PACK
17.0000 g | PACK | Freq: Every day | ORAL | Status: DC | PRN
  Administered 2014-12-28: 17 g via ORAL
  Filled 2014-12-27: qty 1

## 2014-12-27 MED ORDER — SODIUM CHLORIDE 0.9 % IV SOLN: 75.0000 mL | INTRAVENOUS | Status: DC

## 2014-12-27 MED ORDER — KETOROLAC TROMETHAMINE 15 MG/ML IJ SOLN
INTRAMUSCULAR | Status: AC
  Filled 2014-12-27: qty 1

## 2014-12-27 MED ORDER — ALBUMIN HUMAN 5 % IV SOLN
INTRAVENOUS | Status: DC | PRN
  Administered 2014-12-27: 10:00:00 via INTRAVENOUS

## 2014-12-27 MED ORDER — PROPOFOL 10 MG/ML IV BOLUS
INTRAVENOUS | Status: AC
  Filled 2014-12-27: qty 20

## 2014-12-27 MED ORDER — NEOSTIGMINE METHYLSULFATE 10 MG/10ML IV SOLN
INTRAVENOUS | Status: DC | PRN
  Administered 2014-12-27: 5 mg via INTRAVENOUS

## 2014-12-27 MED ORDER — FENTANYL CITRATE (PF) 250 MCG/5ML IJ SOLN
INTRAMUSCULAR | Status: AC
  Filled 2014-12-27: qty 5

## 2014-12-27 MED ORDER — COLCHICINE 0.6 MG PO TABS: 0.6000 mg | ORAL_TABLET | Freq: Every day | ORAL | Status: DC | PRN

## 2014-12-27 MED ORDER — VANCOMYCIN HCL IN DEXTROSE 1-5 GM/200ML-% IV SOLN
1000.0000 mg | INTRAVENOUS | Status: AC
  Administered 2014-12-27: 1000 mg via INTRAVENOUS
  Filled 2014-12-27: qty 200

## 2014-12-27 MED ORDER — VANCOMYCIN HCL IN DEXTROSE 1-5 GM/200ML-% IV SOLN
1000.0000 mg | Freq: Two times a day (BID) | INTRAVENOUS | Status: AC
  Administered 2014-12-27: 1000 mg via INTRAVENOUS
  Filled 2014-12-27: qty 200

## 2014-12-27 MED ORDER — SODIUM CHLORIDE 0.9 % IR SOLN
Status: DC | PRN
  Administered 2014-12-27: 1000 mL

## 2014-12-27 MED ORDER — VANCOMYCIN HCL IN DEXTROSE 1-5 GM/200ML-% IV SOLN: 1000.0000 mg | INTRAVENOUS | Status: DC

## 2014-12-27 MED ORDER — FENTANYL CITRATE (PF) 100 MCG/2ML IJ SOLN
INTRAMUSCULAR | Status: DC
Start: 2014-12-27 — End: 2014-12-27
  Filled 2014-12-27: qty 2

## 2014-12-27 MED ORDER — GLYCOPYRROLATE 0.2 MG/ML IJ SOLN
INTRAMUSCULAR | Status: DC | PRN
Start: 2014-12-27 — End: 2014-12-27
  Administered 2014-12-27: 0.6 mg via INTRAVENOUS

## 2014-12-27 MED ORDER — LACTATED RINGERS IV SOLN
INTRAVENOUS | Status: DC
  Administered 2014-12-27 (×3): via INTRAVENOUS

## 2014-12-27 MED ORDER — ACETAMINOPHEN 10 MG/ML IV SOLN
1000.0000 mg | Freq: Four times a day (QID) | INTRAVENOUS | Status: DC
  Administered 2014-12-27 – 2014-12-28 (×3): 1000 mg via INTRAVENOUS
  Filled 2014-12-27 (×3): qty 100

## 2014-12-27 MED ORDER — DOCUSATE SODIUM 100 MG PO CAPS
100.0000 mg | ORAL_CAPSULE | Freq: Two times a day (BID) | ORAL | Status: DC
  Administered 2014-12-27 – 2014-12-29 (×3): 100 mg via ORAL
  Filled 2014-12-27 (×4): qty 1

## 2014-12-27 MED ORDER — AMLODIPINE BESYLATE 5 MG PO TABS
5.0000 mg | ORAL_TABLET | Freq: Every day | ORAL | Status: DC
Start: 2014-12-28 — End: 2014-12-29
  Administered 2014-12-28 – 2014-12-29 (×2): 5 mg via ORAL
  Filled 2014-12-27 (×2): qty 1

## 2014-12-27 MED ORDER — ONDANSETRON HCL 4 MG/2ML IJ SOLN
4.0000 mg | Freq: Four times a day (QID) | INTRAMUSCULAR | Status: DC | PRN
  Filled 2014-12-27: qty 2

## 2014-12-27 MED ORDER — HYDROMORPHONE HCL 1 MG/ML IJ SOLN: 0.2500 mg | INTRAMUSCULAR | Status: DC | PRN

## 2014-12-27 MED ORDER — ONDANSETRON HCL 4 MG PO TABS: 4.0000 mg | ORAL_TABLET | Freq: Four times a day (QID) | ORAL | Status: DC | PRN

## 2014-12-27 MED ORDER — DIPHENHYDRAMINE HCL 12.5 MG/5ML PO ELIX: 12.5000 mg | ORAL_SOLUTION | ORAL | Status: DC | PRN

## 2014-12-27 MED ORDER — CHLORHEXIDINE GLUCONATE 4 % EX LIQD: 60.0000 mL | Freq: Once | CUTANEOUS | Status: DC

## 2014-12-27 MED ORDER — KETOROLAC TROMETHAMINE 15 MG/ML IJ SOLN
7.5000 mg | Freq: Four times a day (QID) | INTRAMUSCULAR | Status: AC
  Administered 2014-12-27 – 2014-12-28 (×4): 7.5 mg via INTRAVENOUS
  Filled 2014-12-27 (×3): qty 1

## 2014-12-27 MED ORDER — ROCURONIUM BROMIDE 100 MG/10ML IV SOLN
INTRAVENOUS | Status: DC | PRN
  Administered 2014-12-27: 50 mg via INTRAVENOUS

## 2014-12-27 MED ORDER — METHOCARBAMOL 1000 MG/10ML IJ SOLN
500.0000 mg | Freq: Four times a day (QID) | INTRAVENOUS | Status: DC | PRN
  Filled 2014-12-27: qty 5

## 2014-12-27 MED ORDER — DEXAMETHASONE SODIUM PHOSPHATE 4 MG/ML IJ SOLN
INTRAMUSCULAR | Status: DC | PRN
  Administered 2014-12-27: 8 mg via INTRAVENOUS

## 2014-12-27 MED ORDER — ACETAMINOPHEN 10 MG/ML IV SOLN
INTRAVENOUS | Status: AC
  Filled 2014-12-27: qty 100

## 2014-12-27 MED ORDER — PHENOL 1.4 % MT LIQD: 1.0000 | OROMUCOSAL | Status: DC | PRN

## 2014-12-27 MED ORDER — METOCLOPRAMIDE HCL 5 MG PO TABS: 5.0000 mg | ORAL_TABLET | Freq: Three times a day (TID) | ORAL | Status: DC | PRN

## 2014-12-27 MED ORDER — DULOXETINE HCL 30 MG PO CPEP
30.0000 mg | ORAL_CAPSULE | Freq: Every day | ORAL | Status: DC
  Administered 2014-12-28 – 2014-12-29 (×2): 30 mg via ORAL
  Filled 2014-12-27 (×2): qty 1

## 2014-12-27 MED ORDER — BISACODYL 10 MG RE SUPP
10.0000 mg | Freq: Every day | RECTAL | Status: DC | PRN
  Administered 2014-12-28: 10 mg via RECTAL
  Filled 2014-12-27: qty 1

## 2014-12-27 MED ORDER — LISINOPRIL 40 MG PO TABS
40.0000 mg | ORAL_TABLET | Freq: Every day | ORAL | Status: DC
  Administered 2014-12-27 – 2014-12-29 (×3): 40 mg via ORAL
  Filled 2014-12-27 (×3): qty 1

## 2014-12-27 MED ORDER — MIDAZOLAM HCL 5 MG/5ML IJ SOLN
INTRAMUSCULAR | Status: DC | PRN
  Administered 2014-12-27: 2 mg via INTRAVENOUS

## 2014-12-27 MED ORDER — METHOCARBAMOL 500 MG PO TABS
500.0000 mg | ORAL_TABLET | Freq: Four times a day (QID) | ORAL | Status: DC | PRN
  Administered 2014-12-27 – 2014-12-29 (×6): 500 mg via ORAL
  Filled 2014-12-27 (×8): qty 1

## 2014-12-27 MED ORDER — LIDOCAINE HCL (CARDIAC) 20 MG/ML IV SOLN
INTRAVENOUS | Status: DC | PRN
  Administered 2014-12-27: 50 mg via INTRAVENOUS

## 2014-12-27 SURGICAL SUPPLY — 72 items
APL SKNCLS STERI-STRIP NONHPOA (GAUZE/BANDAGES/DRESSINGS) ×1
BENZOIN TINCTURE PRP APPL 2/3 (GAUZE/BANDAGES/DRESSINGS) ×2 IMPLANT
BIT DRILL 1.8 (BIT) ×1
BIT DRILL 1.8MM (BIT) ×1 IMPLANT
BIT DRILL LONG ACUTRAK2 4.7 (BIT) ×1 IMPLANT
BLADE SAW SGTL 83.5X18.5 (BLADE) ×2 IMPLANT
CAPT SHLDR TOTAL 1 ×2 IMPLANT
CEMENT BONE DEPUY (Cement) ×2 IMPLANT
CEMENT HV SMART SET (Cement) ×2 IMPLANT
CLEANER TIP ELECTROSURG 2X2 (MISCELLANEOUS) ×2 IMPLANT
CLSR STERI-STRIP ANTIMIC 1/2X4 (GAUZE/BANDAGES/DRESSINGS) ×2 IMPLANT
COVER SURGICAL LIGHT HANDLE (MISCELLANEOUS) ×2 IMPLANT
DRAPE ORTHO SPLIT 77X108 STRL (DRAPES) ×6
DRAPE SURG ORHT 6 SPLT 77X108 (DRAPES) ×3 IMPLANT
DRILL BIT 1.6 (BIT) ×2 IMPLANT
DRILL BIT 1.8MM (BIT) ×2
DRILL LONG ACUTRAK2 4.7 (BIT) ×2
DRSG ADAPTIC 3X8 NADH LF (GAUZE/BANDAGES/DRESSINGS) ×2 IMPLANT
DRSG AQUACEL AG ADV 3.5X10 (GAUZE/BANDAGES/DRESSINGS) ×2 IMPLANT
DRSG MEPILEX BORDER 4X8 (GAUZE/BANDAGES/DRESSINGS) ×2 IMPLANT
DRSG PAD ABDOMINAL 8X10 ST (GAUZE/BANDAGES/DRESSINGS) ×4 IMPLANT
DURAPREP 26ML APPLICATOR (WOUND CARE) ×2 IMPLANT
ELECT CAUTERY BLADE 6.4 (BLADE) ×2 IMPLANT
ELECT NEEDLE TIP 2.8 STRL (NEEDLE) ×2 IMPLANT
ELECT REM PT RETURN 9FT ADLT (ELECTROSURGICAL) ×2
ELECTRODE REM PT RTRN 9FT ADLT (ELECTROSURGICAL) ×1 IMPLANT
FACESHIELD WRAPAROUND (MASK) ×6 IMPLANT
GLENOID ANCHOR PEG CROSSLK 48 (Orthopedic Implant) ×2 IMPLANT
GLOVE BIO SURGEON STRL SZ 6.5 (GLOVE) ×2 IMPLANT
GLOVE BIOGEL PI IND STRL 6.5 (GLOVE) ×1 IMPLANT
GLOVE BIOGEL PI IND STRL 8 (GLOVE) ×2 IMPLANT
GLOVE BIOGEL PI IND STRL 8.5 (GLOVE) ×1 IMPLANT
GLOVE BIOGEL PI INDICATOR 6.5 (GLOVE) ×1
GLOVE BIOGEL PI INDICATOR 8 (GLOVE) ×2
GLOVE BIOGEL PI INDICATOR 8.5 (GLOVE) ×1
GLOVE ECLIPSE 8.0 STRL XLNG CF (GLOVE) ×4 IMPLANT
GLOVE SURG ORTHO 8.0 STRL STRW (GLOVE) ×4 IMPLANT
GLOVE SURG ORTHO 8.5 STRL (GLOVE) ×2 IMPLANT
GOWN STRL REUS W/ TWL LRG LVL3 (GOWN DISPOSABLE) ×2 IMPLANT
GOWN STRL REUS W/ TWL XL LVL3 (GOWN DISPOSABLE) ×1 IMPLANT
GOWN STRL REUS W/TWL LRG LVL3 (GOWN DISPOSABLE) ×4
GOWN STRL REUS W/TWL XL LVL3 (GOWN DISPOSABLE) ×2
HANDPIECE INTERPULSE COAX TIP (DISPOSABLE)
K-WIRE .045X6 DBL TRO NS (WIRE) ×4
KIT BASIN OR (CUSTOM PROCEDURE TRAY) ×4 IMPLANT
KIT ROOM TURNOVER OR (KITS) ×2 IMPLANT
KWIRE .045X6 DBL TRO NS (WIRE) ×2 IMPLANT
MANIFOLD NEPTUNE II (INSTRUMENTS) ×2 IMPLANT
NEEDLE 1/2 CIR CATGUT .05X1.09 (NEEDLE) ×4 IMPLANT
NS IRRIG 1000ML POUR BTL (IV SOLUTION) ×2 IMPLANT
PACK SHOULDER (CUSTOM PROCEDURE TRAY) ×2 IMPLANT
PAD ARMBOARD 7.5X6 YLW CONV (MISCELLANEOUS) ×2 IMPLANT
RETRIEVER SUT HEWSON (MISCELLANEOUS) ×2 IMPLANT
SET HNDPC FAN SPRY TIP SCT (DISPOSABLE) IMPLANT
SLING ARM IMMOBILIZER LRG (SOFTGOODS) ×4 IMPLANT
SLING ARM IMMOBILIZER MED (SOFTGOODS) ×2 IMPLANT
SMARTMIX MINI TOWER (MISCELLANEOUS) ×2
SPONGE LAP 4X18 X RAY DECT (DISPOSABLE) ×12 IMPLANT
SPONGE SCRUB IODOPHOR (GAUZE/BANDAGES/DRESSINGS) ×2 IMPLANT
STAPLER VISISTAT 35W (STAPLE) ×2 IMPLANT
STRIP CLOSURE SKIN 1/2X4 (GAUZE/BANDAGES/DRESSINGS) ×2 IMPLANT
SUCTION FRAZIER TIP 10 FR DISP (SUCTIONS) ×8 IMPLANT
SUT ETHIBOND NAB CT1 #1 30IN (SUTURE) ×6 IMPLANT
SUT FIBERWIRE #2 38 T-5 BLUE (SUTURE) ×2
SUT MNCRL AB 3-0 PS2 18 (SUTURE) ×2 IMPLANT
SUT VIC AB 0 CT1 27 (SUTURE) ×4
SUT VIC AB 0 CT1 27XBRD ANBCTR (SUTURE) ×2 IMPLANT
SUT VIC AB 2-0 FS1 27 (SUTURE) ×4 IMPLANT
SUTURE FIBERWR #2 38 T-5 BLUE (SUTURE) ×1 IMPLANT
SYR CONTROL 10ML LL (SYRINGE) IMPLANT
TOWER SMARTMIX MINI (MISCELLANEOUS) ×1 IMPLANT
TUBE CONNECTING 12X1/4 (SUCTIONS) ×2 IMPLANT

## 2014-12-27 NOTE — Op Note (Signed)
PATIENT ID:      Ashley Riddle  MRN:     [id number] DOB/AGE:    July 01, 1945 / 69 y.o.       OPERATIVE REPORT    DATE OF PROCEDURE:  12/27/2014       PREOPERATIVE DIAGNOSIS:   LEFT SHOULDER END STAGE OSTEOARTHRITIS                                                       Estimated body mass index is 31.45 kg/(m^2) as calculated from the following:   Height as of 12/19/14: 5\' 2"  (1.575 m).   Weight as of this encounter: 78.019 kg (172 lb).     POSTOPERATIVE DIAGNOSIS:   LEFT SHOULDER END STAGE OSTEOARTHRITIS                                                                     Estimated body mass index is 31.45 kg/(m^2) as calculated from the following:   Height as of 12/19/14: 5\' 2"  (1.575 m).   Weight as of this encounter: 78.019 kg (172 lb).     PROCEDURE:  Procedure(s):LEFT TOTAL SHOULDER ARTHROPLASTY     SURGEON:  Joni Fears, MD    ASSISTANT:   Biagio Borg, PA-C   (Present and scrubbed throughout the case, critical for assistance with exposure, retraction, instrumentation, and closure.)          ANESTHESIA: regional and general     DRAINS: none :      TOURNIQUET TIME: * No tourniquets in log *    COMPLICATIONS:  FRACTURE OF GLENOID TREATED WITH ORIF  CONDITION:  stable  PROCEDURE IN DETAIL: [place], PETER W 12/27/2014, 12:51 PM

## 2014-12-27 NOTE — Anesthesia Postprocedure Evaluation (Signed)
Anesthesia Post Note  Patient: Ashley Riddle  Procedure(s) Performed: Procedure(s) (LRB): TOTAL SHOULDER ARTHROPLASTY (Left)  Anesthesia type: general  Patient location: PACU  Post pain: Pain level controlled  Post assessment: Patient's Cardiovascular Status Stable  Last Vitals:  Filed Vitals:   12/27/14 1430  BP: 154/78  Pulse: 69  Temp:   Resp: 10    Post vital signs: Reviewed and stable  Level of consciousness: sedated  Complications: No apparent anesthesia complications

## 2014-12-27 NOTE — H&P (Signed)
CHIEF COMPLAINT:  Painful left shoulder.    HISTORY OF PRESENT ILLNESS:  Ashley Riddle is a very pleasant 69 year old African American female who is seen today for evaluation of her left shoulder.  She states that she has had problems with her left shoulder for many years.  She does not remember any history of injury or trauma, but over time has developed a lot of pain and discomfort in the shoulder and has had difficulty raising her arm over her head as well as sleeping.  She denies any radicular type symptoms, but she has also noted though that she is having less range of motion in the shoulder with significant pain and discomfort.  She was seen in the past and has had cortisone injections to the left shoulder, had her last one in April.  She states it only lasted about 3 days.  She is in pain 24 hours a day and this is more of a dull and aching pain with occasional sharpness especially if she does any motion with it.  She has tried the anti-inflammatories as well as injections which have not been very beneficial.  She has gotten to the point now where she cannot use the left shoulder for activities of daily living.  She has nighttime pain, daylight pain, pain pretty much 24 hours a day.  Seen today for re-evaluation.   PAST MEDICAL HISTORY:   In general her general health is good.     HOSPITALIZATION:  In 1965, 1967, and 1970 Cesarean sections.  In 1989 for back surgery.  As a teenager for an appendectomy.    MEDICATIONS:  Norco 10/325 q. 4 to 6 p.r.n. pain, tramadol 50 mg q. 8 h. p.r.n. pain, lisinopril 40 mg daily, amlodipine 5 mg daily, multivitamin from [facility].   ALLERGIES:  PENICILLIN which gives her hives.   FOURTEEN-POINT REVIEW OF SYSTEMS:  Reveals upper dentures.  She did have hypertension, and that has been for the past 4 years and is on medications for that.  She does have hepatitis C, at least she did, and she states she was cured from it.  She apparently has sleep apnea also.   FAMILY  HISTORY:  Positive for mother who is deceased at age 65 from diabetes mellitus.  She did have cancer but is not quite sure what kind.  Her father died at 31 years of age.  Brother living at 60, 1 deceased at 75 years of age from [place].  She has 2 sisters alive, 26 and 62; one who died at age 22 from cirrhosis.  They have hypertension and diabetes.   SOCIAL HISTORY:  Ms. 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  She has smoked in the past; quit 7 years ago.  Prior to that she had 1/2 pack per day for 40 years.  She denies the use of alcohol.   PHYSICAL EXAMINATION:   General:  Health is good. Vitals:  Height 5 feet 2 inches.  Weight 172 pounds.  BMI 31.5.  Temperature 97 Fahrenheit.  Pulse 76.  Respirations 16.  Blood pressure 128/82. Head:  Normocephalic.  Eyes:  Pupils equal, round, and reactive to light and accommodation with extraocular movements intact. Ears/Nose/Throat:  Benign. Neck:  Supple.  No bruits were noted. Chest:  Had good expansion. Lungs:  Essentially clear. Cardiac:  Had a regular rhythm and rate.  She does have a grade 2/6 to 3/6 systolic murmur left sternal border.  Pulses:  Were 1+, bilateral and symmetric. Abdomen:  Scaphoid, soft, nontender.  No mass palpable.  Normal bowel sounds present. Breast/Rectal/Genital:  Not performed and not indicated for an orthopedic procedure. Musculoskeletal:  Skin is intact.  Range of motion 60 degrees of abduction with minimal internal and external rotation.  Flexion to about 60 degrees also.  Neurovascularly intact distally.  Good radial pulse.   CLINICAL IMPRESSION:  1.  Advanced osteoarthritis of the left shoulder. 2.  Sleep apnea. 3.  History of hepatitis C and apparently states cured. 4.  History of hypertension.   RECOMMENDATIONS:  At this time she has rather significant glenohumeral osteoarthritis on radiographs.  Therefore, our plan is for left total shoulder versus  hemiarthroplasty.  I reviewed the clearance form from her medical doctor, Dr. Montez Morita, who feels that she is a candidate from cardiac and medical.  She does not use a CPAP machine.  Therefore, our plan is to proceed with surgery as noted above.  Procedure, risks, and benefits were fully explained.  She is understanding.  She will be sent to the hospital to obtain appropriate laboratory studies.  Depending on their results, we will proceed or cancel as indicated.   Cherylann Ratel, PA-C

## 2014-12-27 NOTE — Progress Notes (Signed)
Utilization review completed.  

## 2014-12-27 NOTE — Anesthesia Preprocedure Evaluation (Addendum)
Anesthesia Evaluation  Patient identified by MRN, date of birth, ID band Patient awake    Reviewed: Allergy & Precautions, NPO status , Patient's Chart, lab work & pertinent test results  History of Anesthesia Complications Negative for: history of anesthetic complications  Airway Mallampati: II  TM Distance: >3 FB Neck ROM: Full    Dental  (+) Edentulous Upper, Dental Advisory Given   Pulmonary asthma , former smoker,    Pulmonary exam normal       Cardiovascular hypertension, Pt. on medications + Valvular Problems/Murmurs AS Rhythm:Regular Rate:Normal + Systolic murmurs Study Conclusions  - Left ventricle: The cavity size was normal. Systolic function was normal. The estimated ejection fraction was in the range of 55% to 60%. Wall motion was normal; there were no regional wall motion abnormalities. Left ventricular diastolic function parameters were normal. - Aortic valve: Probably bicuspid. There was mild stenosis. Valve area (VTI): 1.89 cm^2. Valve area (Vmax): 1.61 cm^2. Valve area (Vmean): 1.98 cm^2.    Neuro/Psych PSYCHIATRIC DISORDERS Anxiety negative neurological ROS     GI/Hepatic negative GI ROS, (+) Hepatitis -, C  Endo/Other  negative endocrine ROS  Renal/GU negative Renal ROS     Musculoskeletal   Abdominal   Peds  Hematology   Anesthesia Other Findings   Reproductive/Obstetrics                          Anesthesia Physical Anesthesia Plan  ASA: III  Anesthesia Plan: General   Post-op Pain Management: GA combined w/ Regional for post-op pain   Induction: Intravenous  Airway Management Planned: Oral ETT  Additional Equipment:   Intra-op Plan:   Post-operative Plan: Extubation in OR  Informed Consent: I have reviewed the patients History and Physical, chart, labs and discussed the procedure including the risks, benefits and alternatives for the  proposed anesthesia with the patient or authorized representative who has indicated his/her understanding and acceptance.   Dental advisory given  Plan Discussed with: CRNA, Surgeon and Anesthesiologist  Anesthesia Plan Comments:       Anesthesia Quick Evaluation

## 2014-12-27 NOTE — Anesthesia Procedure Notes (Addendum)
Anesthesia Regional Block:  Interscalene brachial plexus block  Pre-Anesthetic Checklist: ,, timeout performed, Correct Patient, Correct Site, Correct Laterality, Correct Procedure, Correct Position, site marked, Risks and benefits discussed,  Surgical consent,  Pre-op evaluation,  At surgeon's request and post-op pain management  Laterality: Left  Prep: chloraprep       Needles:  Injection technique: Single-shot  Needle Type: Echogenic Stimulator Needle     Needle Length: 5cm 5 cm Needle Gauge: 22 and 22 G    Additional Needles:  Procedures: ultrasound guided (picture in chart) and nerve stimulator Interscalene brachial plexus block  Nerve Stimulator or Paresthesia:  Response: bicep contraction, 0.45 mA,   Additional Responses:   Narrative:  Start time: 12/27/2014 9:09 AM End time: 12/27/2014 9:19 AM Injection made incrementally with aspirations every 5 mL.  Performed by: Personally  Anesthesiologist: Duane Boston  Additional Notes: Functioning IV was confirmed and monitors applied.  A 37mm 22ga echogenic arrow stimulator was used. Sterile prep and drape,hand hygiene and sterile gloves were used.Ultrasound guidance: relevant anatomy identified, needle position confirmed, local anesthetic spread visualized around nerve(s)., vascular puncture avoided.  Image printed for medical record.  Negative aspiration and negative test dose prior to incremental administration of local anesthetic. The patient tolerated the procedure well.   Procedure Name: Intubation Date/Time: 12/27/2014 9:23 AM Performed by: Vennie Homans Pre-anesthesia Checklist: Patient identified, Timeout performed, Emergency Drugs available, Suction available and Patient being monitored Patient Re-evaluated:Patient Re-evaluated prior to inductionOxygen Delivery Method: Circle system utilized Preoxygenation: Pre-oxygenation with 100% oxygen Intubation Type: IV induction Ventilation: Mask ventilation without  difficulty Laryngoscope Size: Mac and 3 Grade View: Grade I Tube type: Oral Tube size: 7.0 mm Number of attempts: 1 Airway Equipment and Method: Stylet Placement Confirmation: ETT inserted through vocal cords under direct vision,  positive ETCO2 and breath sounds checked- equal and bilateral Secured at: 21 cm Tube secured with: Tape Dental Injury: Teeth and Oropharynx as per pre-operative assessment

## 2014-12-27 NOTE — H&P (Signed)
  The recent History & Physical has been reviewed. I have personally examined the patient today. There is no interval change to the documented History & Physical. The patient would like to proceed with the procedure.  Joni Fears W 12/27/2014,  8:44 AM

## 2014-12-27 NOTE — Transfer of Care (Signed)
Immediate Anesthesia Transfer of Care Note  Patient: Ashley Riddle  Procedure(s) Performed: Procedure(s): TOTAL SHOULDER ARTHROPLASTY (Left)  Patient Location: PACU  Anesthesia Type:GA combined with regional for post-op pain  Level of Consciousness: awake, alert , oriented, patient cooperative and responds to stimulation  Airway & Oxygen Therapy: Patient Spontanous Breathing and Patient connected to nasal cannula oxygen  Post-op Assessment: Report given to RN, Post -op Vital signs reviewed and stable and Patient able to stick tongue midline  Post vital signs: stable  Last Vitals:  Filed Vitals:   12/27/14 0812  BP: 139/69  Pulse: 74  Temp: 36.7 C  Resp: 20    Complications: No apparent anesthesia complications

## 2014-12-28 ENCOUNTER — Encounter (HOSPITAL_COMMUNITY): Payer: Self-pay | Admitting: Orthopaedic Surgery

## 2014-12-28 LAB — CBC
HEMATOCRIT: 27.1 % — AB (ref 36.0–46.0)
Hemoglobin: 8.9 g/dL — ABNORMAL LOW (ref 12.0–15.0)
MCH: 27.6 pg (ref 26.0–34.0)
MCHC: 32.8 g/dL (ref 30.0–36.0)
MCV: 84.2 fL (ref 78.0–100.0)
PLATELETS: 137 10*3/uL — AB (ref 150–400)
RBC: 3.22 MIL/uL — ABNORMAL LOW (ref 3.87–5.11)
RDW: 13 % (ref 11.5–15.5)
WBC: 5.3 10*3/uL (ref 4.0–10.5)

## 2014-12-28 LAB — BASIC METABOLIC PANEL
Anion gap: 4 — ABNORMAL LOW (ref 5–15)
BUN: <5 mg/dL — ABNORMAL LOW (ref 6–20)
CO2: 27 mmol/L (ref 22–32)
Calcium: 8.9 mg/dL (ref 8.9–10.3)
Chloride: 104 mmol/L (ref 101–111)
Creatinine, Ser: 0.52 mg/dL (ref 0.44–1.00)
GFR calc Af Amer: >60 mL/min (ref >60)
GFR calc non Af Amer: >60 mL/min (ref >60)
Glucose, Bld: 125 mg/dL — ABNORMAL HIGH (ref 65–99)
POTASSIUM: 4 mmol/L (ref 3.5–5.1)
Sodium: 135 mmol/L (ref 135–145)

## 2014-12-28 MED ORDER — ACETAMINOPHEN 500 MG PO TABS
1000.0000 mg | ORAL_TABLET | Freq: Once | ORAL | Status: DC
  Filled 2014-12-28: qty 2

## 2014-12-28 MED ORDER — OXYCODONE HCL 5 MG PO TABS
5.0000 mg | ORAL_TABLET | ORAL | Status: DC | PRN
  Administered 2014-12-28 (×2): 10 mg via ORAL
  Administered 2014-12-28 (×2): 5 mg via ORAL
  Administered 2014-12-28 – 2014-12-29 (×4): 10 mg via ORAL
  Filled 2014-12-28 (×8): qty 2

## 2014-12-28 NOTE — Op Note (Signed)
NAMEKAWENA, LYDAY             ACCOUNT NO.:  [id number]  MEDICAL RECORD NO.:  [id number]  LOCATION:  [id number]                        FACILITY:  [facility]  PHYSICIAN:  Vonna Kotyk. Legion Discher, M.D.DATE OF BIRTH:  December 28, 1945  DATE OF PROCEDURE:  12/27/2014 DATE OF DISCHARGE:                              OPERATIVE REPORT   PREOPERATIVE DIAGNOSIS:  End-stage primary osteoarthritis of the left shoulder.  POSTOPERATIVE DIAGNOSIS:  End-stage primary osteoarthritis of the left shoulder.  PROCEDURE:  Left total shoulder replacement.  SURGEON:  Vonna Kotyk. Durward Fortes, M.D.  ASSISTANTS:  Aaron Edelman D. Petrarca, PA-C and Lind Guest. Ninfa Linden, M.D.'s P.A., Erskine Emery, P.A.  ANESTHESIA:  General with supplemental interscalene nerve block.  DRAINS:  None.  TOURNIQUET:  None.  COMPLICATION:  Glenoid fracture, treated with ORIF.  CONDITION:  Stable.  DESCRIPTION OF PROCEDURE:  Ms. Boulais was met in the holding area, identified the left shoulder as appropriate operative site, marked it accordingly.  She did receive a preoperative interscalene nerve block. The patient was then transported to room #7 and placed under general anesthesia without difficulty.  She was then placed in a semi-sitting position with the shoulder frame and secured to the operating room table without difficulty.  Time-out was called.  The left shoulder was then prepped with chlorhexidine scrub and DuraPrep x2 from the base of the neck to the hands.  A sterile draping was performed.  The skin incision was outlined along the deltopectoral groove, extending from just superior to the coronoid to approximately 4 inches.  By sharp dissection, incision was carried down to the subcutaneous tissue.  Gross bleeders were Bovie coagulated.  The deltopectoral groove was easily identified and developed bluntly. Cephalic vein was retracted with the deltoid laterally and any bleeders were Bovie coagulated.  Self-retaining retractors  were inserted.  The clavipectoral fascia was identified and debrided.  At that point, with retractors placed more deeply, I could identify the subscapularis tendon.  There was a subdeltoid bursa that was decompressed.  The biceps groove was identified and then I incised the subscapularis tendon at its origin on the lesser tuberosity and tagged with 0 Ethibond suture.  The joint was entered.  There was a clear yellow joint effusion.  I released the capsule inferiorly, so that I could visualize the head.  I then placed the arm at its side, so I could visualize the head and place retractors about the head.  The head was completely exposed.  There were large osteophytes inferiorly.  A center drill hole was then made in the sulcus just medial to the biceps tendon.  I did evaluate the biceps tendon and appeared to be intact.  It was in its groove and I left it alone.  Initial drill hole was made and then hand reaming was performed to 10 mm.  I had good purchase.  Rasping was then performed to 10 mm.  I measured 48 mm head by 15 mm neck and trialed the 48 head onto the rasp and then reduced and thought I had excellent version that was pointed directly at the glenoid.  With the rasp still in place, retractors were placed about the glenoid. I grossly resected the hypertrophic labrum, released some  of the capsule from behind the glenoid anteriorly, posteriorly, and superiorly.  I measured 48 glenoid that corresponded to the size of the head.  I used the initial template and drilled holes through the center of the glenoid.  The glenoid was flat, but I thought I had the appropriate version.  The drill guide was then inserted.  Center hole was then drilled.  The remaining 3 holes were then placed superiorly and then 2 inferiorly after drilling and then plugging the holes to prevent any rotation.  At that point, I trialed a 48 glenoid, thought I had an excellent fit and then trialed the entire  construct with a 48 head in the 10 mm rasp. I thought I had excellent position.  I was able to sublux the head about 50%, inferiorly subluxed the head about 50%.  At that point, I elected to proceed with insertion of the final glenoid at the point of impaction and then I cleaned the drill holes.  I packed the center hole with the bone and placed bone graft over the anchor peg centrally and then impacted it and then at that point, I heard a crack, so I removed methacrylate and there was a longitudinal crack in the glenoid.  I then fixed this with 2 K-wires 1 above and below the center hole was cracked longitudinally and freely mobile.  I then inserted the K-wires and flushed with the glenoid surface and I thought it had really good position, it was nice and tight.  So, then I proceeded to reinsert the glenoid.  I cleaned the glenoid.  I did not think there was any significant bleeding within the holes.  I inserted the methacrylate and impacted the glenoid.  I thought I had very nice position and maintained position while the methacrylate matured.  At that point, I removed the trial rasp and irrigated the joint with saline solution and then impacted the final 10 mm stem and applied the 48 mm outer diameter head with a 15 mm neck.  This was reduced and again thought I had very good stability, nice position.  The wound was irrigated with saline solution.  I checked the biceps, it was intact.  The subscapularis was then reapproximated with #1 Ethibond.  Wound was irrigated again with saline solution.  The deltopectoral groove was closed with a running 0 Vicryl.  Subcu was closed with 2-0 Vicryl and 3-0 Monocryl and then Steri-Strips over benzoin.  Arm was placed in a sling.  The patient was placed then in supine, awakened, returned to the postanesthesia recovery room in satisfactory condition.    Vonna Kotyk. Durward Fortes, M.D.    PWW/MEDQ  D:  12/27/2014  T:  12/28/2014  Job:  [id number]

## 2014-12-28 NOTE — Progress Notes (Signed)
Subjective: 1 Day Post-Op Procedure(s) (LRB): TOTAL SHOULDER ARTHROPLASTY (Left) Patient reports pain as moderate and severe.    Objective: Vital signs in last 24 hours: Temp:  [97.4 F (36.3 C)-98.9 F (37.2 C)] 98.7 F (37.1 C) (07/27 0048) Pulse Rate:  [65-82] 82 (07/27 0048) Resp:  [10-20] 17 (07/27 0048) BP: (105-159)/(61-79) 105/63 mmHg (07/27 0048) SpO2:  [99 %-100 %] 99 % (07/27 0048) Weight:  [78.019 kg (172 lb)] 78.019 kg (172 lb) (07/26 0812)  Intake/Output from previous day: 07/26 0701 - 07/27 0700 In: 2752.5 [P.O.:240; I.V.:2262.5; IV Piggyback:250] Out: 475 [Urine:350; Blood:125] Intake/Output this shift:     Recent Labs  12/28/14 0355  HGB 8.9*    Recent Labs  12/28/14 0355  WBC 5.3  RBC 3.22*  HCT 27.1*  PLT 137*    Recent Labs  12/28/14 0355  NA 135  K 4.0  CL 104  CO2 27  BUN <5*  CREATININE 0.52  GLUCOSE 125*  CALCIUM 8.9    Neurovascular intact  Assessment/Plan: 1 Day Post-Op Procedure(s) (LRB): TOTAL SHOULDER ARTHROPLASTY (Left) Advance diet Plan for discharge tomorrow  Will order PO narcotics and see how she does with pain control  Ashley Riddle 12/28/2014, 8:02 AM

## 2014-12-29 DIAGNOSIS — S42142A Displaced fracture of glenoid cavity of scapula, left shoulder, initial encounter for closed fracture: Secondary | ICD-10-CM | POA: Diagnosis not present

## 2014-12-29 MED ORDER — OXYCODONE HCL 5 MG PO TABS: 5.0000 mg | ORAL_TABLET | ORAL | Status: DC | PRN

## 2014-12-29 MED ORDER — METHOCARBAMOL 500 MG PO TABS: 500.0000 mg | ORAL_TABLET | Freq: Three times a day (TID) | ORAL | Status: DC | PRN

## 2014-12-29 NOTE — Care Management Important Message (Signed)
Important Message  Patient Details  Name: Ashley Riddle MRN: [id number] Date of Birth: 1946-05-30   Medicare Important Message Given:  Yes-second notification given    Delorse Lek 12/29/2014, 11:01 AM

## 2014-12-29 NOTE — Progress Notes (Signed)
Patient ID: LAKAYLA BARRINGTON, female   DOB: 03/04/1946, 69 y.o.   MRN: [id number] PATIENT ID: MIKAELLA ESCALONA        MRN:  [id number]          DOB/AGE: July 21, 1945 / 69 y.o.    Joni Fears, MD   Biagio Borg, PA-C [address] [place], [place]  [address]                             [phone]   PROGRESS NOTE  Subjective:  negative for Chest Pain  negative for Shortness of Breath  negative for Nausea/Vomiting   negative for Calf Pain    Tolerating Diet: yes         Patient reports pain as moderate.     More comfortable today and anxious for discharge  Objective: Vital signs in last 24 hours:   Patient Vitals for the past 24 hrs:  BP Temp Temp src Pulse Resp SpO2  12/28/14 2212 121/66 mmHg 99 F (37.2 C) Oral (!) 102 16 97 %  12/28/14 1402 119/65 mmHg 98.5 F (36.9 C) Oral 83 16 98 %      Intake/Output from previous day:   07/27 0701 - 07/28 0700 In: 820 [P.O.:720] Out: -    Intake/Output this shift:       Intake/Output      07/27 0701 - 07/28 0700 07/28 0701 - 07/29 0700   P.O. 720    I.V. (mL/kg)     IV Piggyback 100    Total Intake(mL/kg) 820 (10.5)    Urine (mL/kg/hr)     Blood     Total Output       Net +820          Urine Occurrence 4 x       LABORATORY DATA:  Recent Labs  12/28/14 0355  WBC 5.3  HGB 8.9*  HCT 27.1*  PLT 137*    Recent Labs  12/28/14 0355  NA 135  K 4.0  CL 104  CO2 27  BUN <5*  CREATININE 0.52  GLUCOSE 125*  CALCIUM 8.9   Lab Results  Component Value Date   INR 1.12 12/19/2014   INR 1.12 11/07/2011    Recent Radiographic Studies :  Dg Chest 2 View  12/19/2014   CLINICAL DATA:  Shoulder replacement.  EXAM: CHEST  2 VIEW  COMPARISON:  08/19/2014.  12/22/2013  FINDINGS: Mediastinum hilar structures are normal. Calcified pulmonary nodule right mid lung consistent with granuloma or hamartoma. Lungs are clear of acute infiltrates. Heart size normal. No pleural effusion or pneumothorax. Biapical pleural  thickening consistent with scarring. Degenerative changes thoracic spine and both shoulders.  IMPRESSION: 1. No acute cardiopulmonary disease. 2. Stable calcified pulmonary nodule right mid lung most consistent with calcified granuloma or hamartoma.   Electronically Signed   By: Marcello Moores  Register   On: 12/19/2014 12:23     Examination:  General appearance: alert, cooperative and no distress  Wound Exam: clean, dry, intact   Drainage:  None: wound tissue dry  Motor Exam: Opposition, Pinch and Wrist Dorsiflexion Intact  Sensory Exam: Radial, Ulnar and Median normal  Vascular Exam: Normal  Assessment:    2 Days Post-Op  Procedure(s) (LRB): TOTAL SHOULDER ARTHROPLASTY (Left)  ADDITIONAL DIAGNOSIS:  Active Problems:   Primary osteoarthritis of left shoulder   Status post total shoulder arthroplasty  Acute Blood Loss Anemia-asymptomatic   Plan: Occupational Therapy as ordered  DVT Prophylaxis:  None  DISCHARGE PLAN: Home  DISCHARGE NEEDS: will have OT see pre D/C for exercises   dressing changed, wound clean-will D/c today and see within 1 week-will go slow with OT with Fx of glenoid-doing well     Ilse Billman W  12/29/2014 10:00 AM

## 2014-12-29 NOTE — Evaluation (Signed)
Occupational Therapy Evaluation Patient Details Name: Ashley Riddle MRN: [id number] DOB: [birth year] Today's Date: 12/29/2014    History of Present Illness This 69 y.o. female admitted for Lt TSA and ORIF of glenoid fracture.   Clinical Impression   Patient evaluated by Occupational Therapy with no further acute OT needs identified. All education has been completed and the patient has no further questions. All education completed.  She was unable to tolerate PROM shoulder or elbow due to increased pain, but instructed daughter how to perform and provided written handouts. See below for any follow-up Occupational Therapy or equipment needs. OT is signing off. Thank you for this referral.      Follow Up Recommendations  Other (comment) (Per MD recommendations )    Equipment Recommendations  None recommended by OT    Recommendations for Other Services       Precautions / Restrictions Precautions Precautions: Shoulder Type of Shoulder Precautions: Very gentle PROM shoulder FF and abduction to 90* maximum.   Sling on at all times Shoulder Interventions: Shoulder sling/immobilizer;At all times Precaution Booklet Issued: Yes (comment) Precaution Comments: Very gentle PROM shoulder FF and abduction to 90* maximum.   Sling on at all times Required Braces or Orthoses: Sling Restrictions Weight Bearing Restrictions: Yes LUE Weight Bearing: Non weight bearing      Mobility Bed Mobility Overal bed mobility: Needs Assistance Bed Mobility: Sit to Supine       Sit to supine: Min assist;HOB elevated      Transfers Overall transfer level: Modified independent                    Balance                                            ADL                                               Vision     Perception     Praxis      Pertinent Vitals/Pain Pain Assessment: Faces Faces Pain Scale: Hurts whole lot Pain Location: Lt  shoulder  Pain Descriptors / Indicators: Constant;Grimacing;Guarding;Crying Pain Intervention(s): Limited activity within patient's tolerance;Monitored during session;Repositioned;Premedicated before session;RN gave pain meds during session     Hand Dominance Right   Extremity/Trunk Assessment             Communication Communication Communication: No difficulties   Cognition Arousal/Alertness: Awake/alert Behavior During Therapy: WFL for tasks assessed/performed Overall Cognitive Status: Within Functional Limits for tasks assessed                     General Comments       Exercises Exercises: Shoulder;Other exercises Other Exercises Other Exercises: Daughter was instructed how to perform shoulder flexion and abduction passively to 90* with written handouts provided.     Shoulder Instructions Shoulder Instructions Donning/doffing shirt without moving shoulder: Caregiver independent with task Method for sponge bathing under operated UE: Caregiver independent with task Donning/doffing sling/immobilizer: Caregiver independent with task Correct positioning of sling/immobilizer: Caregiver independent with task ROM for elbow, wrist and digits of operated UE: Minimal assistance Sling wearing schedule (on at all times/off for ADL's): Caregiver independent with task Proper positioning of operated  UE when showering: Caregiver independent with task Positioning of UE while sleeping: Supervision/safety;Caregiver independent with task    Home Living Family/patient expects to be discharged to:: Private residence Living Arrangements: Children Available Help at Discharge: Family;Available 24 hours/day Type of Home: House                                  Prior Functioning/Environment Level of Independence: Independent        Comments: Pt worked full time     OT Diagnosis: Generalized weakness;Acute pain   OT Problem List: Decreased strength;Decreased range  of motion;Decreased activity tolerance;Pain;Impaired UE functional use;Decreased knowledge of precautions   OT Treatment/Interventions:      OT Goals(Current goals can be found in the care plan section) Acute Rehab OT Goals Patient Stated Goal: to go home  OT Goal Formulation: All assessment and education complete, DC therapy  OT Frequency:     Barriers to D/C:            Co-evaluation              End of Session Equipment Utilized During Treatment: Other (comment) (sling ) Nurse Communication: Mobility status;Patient requests pain meds  Activity Tolerance: Patient limited by pain Patient left: in bed;with call bell/phone within reach;with family/visitor present   Time: 6314-9702 OT Time Calculation (min): 43 min Charges:  OT General Charges $OT Visit: 1 Procedure OT Evaluation $Initial OT Evaluation Tier I: 1 Procedure OT Treatments $Self Care/Home Management : 8-22 mins $Therapeutic Exercise: 8-22 mins G-Codes:    Zamora Colton M January 06, 2015, 1:13 PM

## 2014-12-29 NOTE — Discharge Summary (Signed)
Joni Fears, MD   Biagio Borg, PA-C [address], [place], [place]  [address]                             [phone]  PATIENT ID: Ashley Riddle        MRN:  [id number]          DOB/AGE: 02/01/1946 / 69 y.o.    DISCHARGE SUMMARY  ADMISSION DATE:    12/27/2014 DISCHARGE DATE:   12/29/2014   ADMISSION DIAGNOSIS: LEFT SHOULDER END STAGE OSTEOARTHRITIS    DISCHARGE DIAGNOSIS:  LEFT SHOULDER END STAGE OSTEOARTHRITIS    ADDITIONAL DIAGNOSIS: Active Problems:   Primary osteoarthritis of left shoulder   Status post total shoulder arthroplasty   Fracture of glenoid process of left scapula  Past Medical History  Diagnosis Date  . Arthritis   . Hypertension   . DDD (degenerative disc disease), lumbar   . DDD (degenerative disc disease), cervical   . HTN (hypertension)   . Gout   . Anxiety   . Constipation   . Hepatitis C     treated  . Pneumonia 12/02/13  . Heart murmur     probable bicuspid AV with mild AS, mild MR by 04/22/14 Echo (Dr. Montez Morita)    PROCEDURE: Procedure(s): TOTAL SHOULDER ARTHROPLASTY Left on 12/27/2014  CONSULTS: none     HISTORY: Ashley Riddle is a very pleasant 69 year old African American female who is seen today for evaluation of her left shoulder. She states that she has had problems with her left shoulder for many years. She does not remember any history of injury or trauma, but over time has developed a lot of pain and discomfort in the shoulder and has had difficulty raising her arm over her head as well as sleeping. She denies any radicular type symptoms, but she has also noted though that she is having less range of motionin the shoulder with significant pain and discomfort. She was seen in the past and has had cortisone injections to the left shoulder, had her last one in April. She states it only lasted about 3 days. She is in pain 24 hours a day and this is more of a dull and aching pain with occasional sharpness especially if she does  any motion with it. She has tried the anti-inflammatories as well as injections which have not been very beneficial. She has gotten to the point now where she cannot use the left shoulder for activities of daily living. She has nighttime pain, daylight pain, pain pretty much 24 hours a day  HOSPITAL COURSE:  Ashley Riddle is a 69 y.o. admitted on 12/27/2014 and found to have a diagnosis of Ashley Riddle.  After appropriate laboratory studies were obtained  they were taken to the operating room on 12/27/2014 and underwent  Procedure(s): TOTAL SHOULDER ARTHROPLASTY  Left.   They were given perioperative antibiotics:  Anti-infectives    Start     Dose/Rate Route Frequency Ordered Stop   12/27/14 2100  vancomycin (VANCOCIN) IVPB 1000 mg/200 mL premix     1,000 mg 200 mL/hr over 60 Minutes Intravenous Every 12 hours 12/27/14 1613 12/27/14 2125   12/27/14 0806  vancomycin (VANCOCIN) IVPB 1000 mg/200 mL premix  Status:  Discontinued     1,000 mg 200 mL/hr over 60 Minutes Intravenous On call to O.R. 12/27/14 9030 12/27/14 0807   12/27/14 0806  vancomycin (VANCOCIN) IVPB 1000 mg/200 mL  premix     1,000 mg 200 mL/hr over 60 Minutes Intravenous On call to O.R. 12/27/14 2505 12/27/14 1005    .  Tolerated the procedure well.  Straight cath in OR at end of procedure     Toradol was given post op.  POD #1, allowed out of bed to a chair.   IV saline locked.  O2 discontionued. Pain meds not very effective secondary to chronic narcotic use  POD #2, OT  Consult for PROM to left shoulder   The remainder of the hospital course was dedicated to pain management.   The patient was discharged on 2 Days Post-Op in  Stable condition.  Blood products given:none  DIAGNOSTIC STUDIES: Recent vital signs: Patient Vitals for the past 24 hrs:  BP Temp Temp src Pulse Resp SpO2  12/28/14 2212 121/66 mmHg 99 F (37.2 C) Oral (!) 102 16 97 %  12/28/14 1402 119/65 mmHg 98.5 F (36.9 C) Oral  83 16 98 %       Recent laboratory studies:  Recent Labs  12/28/14 0355  WBC 5.3  HGB 8.9*  HCT 27.1*  PLT 137*    Recent Labs  12/28/14 0355  NA 135  K 4.0  CL 104  CO2 27  BUN <5*  CREATININE 0.52  GLUCOSE 125*  CALCIUM 8.9   Lab Results  Component Value Date   INR 1.12 12/19/2014   INR 1.12 11/07/2011     Recent Radiographic Studies :  Dg Chest 2 View  12/19/2014   CLINICAL DATA:  Shoulder replacement.  EXAM: CHEST  2 VIEW  COMPARISON:  08/19/2014.  12/22/2013  FINDINGS: Mediastinum hilar structures are normal. Calcified pulmonary nodule right mid lung consistent with granuloma or hamartoma. Lungs are clear of acute infiltrates. Heart size normal. No pleural effusion or pneumothorax. Biapical pleural thickening consistent with scarring. Degenerative changes thoracic spine and both shoulders.  IMPRESSION: 1. No acute cardiopulmonary disease. 2. Stable calcified pulmonary nodule right mid lung most consistent with calcified granuloma or hamartoma.   Electronically Signed   By: Marcello Moores  Register   On: 12/19/2014 12:23    DISCHARGE INSTRUCTIONS: Discharge Instructions    Call MD / Call 911    Complete by:  As directed   If you experience chest pain or shortness of breath, CALL 911 and be transported to the hospital emergency room.  If you develope a fever above 101 F, pus (white drainage) or increased drainage or redness at the wound, or calf pain, call your surgeon's office.     Constipation Prevention    Complete by:  As directed   Drink plenty of fluids.  Prune juice may be helpful.  You may use a stool softener, such as Colace (over the counter) 100 mg twice a day.  Use MiraLax (over the counter) for constipation as needed.     Diet general    Complete by:  As directed      Discharge instructions    Complete by:  As directed   Pain Medication: You will be given a prescription for pain medication. Please take the medication as needed. Most patients require pain  medication for only a few days up to a week. Swelling: You can expect some swelling around  your  shoulder. Applying  ice to your shoulder will help keep the swelling to a minimum. Ice can be applied by placing ice cubes(preferably crushed) or frozen peas or corn in a plastic bag and putting the bag on your shoulder  with a towel between the ice bag and your skin.    Dressing: Fluid leakage is common the first night. Precautions to prevent staining of your clothes, bed sheets, etc., should be taken. This fluid was used to inflate the joint during surgery and is commonly tinged red from a small amount of blood. DO NOT REMOVE your dressing till seen in the office. Some bleeding or leakage from the puncture sites may occur for a few days.  Activity: You will go home with a sling/immobilizer.  You need to wear the sling  keeping your arm against your side until you come in for follow-up.    Office Check-Up: If no major problems arise and you are progressing well, we will need to see you in the office as noted in your instructions. Please call the office to make an appointment. We will remove your sutures and discuss your surgery and rehabilitation at that time. IF PROBLEMS, please call sooner.     Driving restrictions    Complete by:  As directed   No driving for 6 weeks     Increase activity slowly as tolerated    Complete by:  As directed      Lifting restrictions    Complete by:  As directed   No lifting for 8-12 weeks     Non weight bearing    Complete by:  As directed   Laterality:  left  Extremity:  Upper           DISCHARGE MEDICATIONS:     Medication List    STOP taking these medications        HYDROcodone-acetaminophen 5-325 MG per tablet  Commonly known as:  NORCO     traMADol 50 MG tablet  Commonly known as:  ULTRAM      TAKE these medications        amLODipine 5 MG tablet  Commonly known as:  NORVASC  Take 1 tablet (5 mg total) by mouth daily.     colchicine 0.6  MG tablet  Take 0.6 mg by mouth daily as needed (gout).     docusate sodium 100 MG capsule  Commonly known as:  COLACE  Take 100 mg by mouth daily as needed for mild constipation.     DULoxetine 30 MG capsule  Commonly known as:  CYMBALTA  Take 1 capsule (30 mg total) by mouth daily.     lisinopril 40 MG tablet  Commonly known as:  PRINIVIL,ZESTRIL  Take 1 tablet (40 mg total) by mouth daily.     methocarbamol 500 MG tablet  Commonly known as:  ROBAXIN  Take 1 tablet (500 mg total) by mouth every 8 (eight) hours as needed for muscle spasms.     oxyCODONE 5 MG immediate release tablet  Commonly known as:  Oxy IR/ROXICODONE  Take 1-2 tablets (5-10 mg total) by mouth every 4 (four) hours as needed for moderate pain, severe pain or breakthrough pain.        FOLLOW UP VISIT:       Follow-up Information    Follow up with [facility], Vonna Kotyk, MD. Schedule an appointment as soon as possible for a visit on 01/06/2015.   Specialty:  Orthopedic Surgery   Contact information:   [address] [place] [address] [phone]       DISPOSITION:   Home  CONDITION:  Stable   Mike Craze. [place], [place] [phone]  12/29/2014 10:12 AM

## 2015-01-11 ENCOUNTER — Encounter: Payer: Self-pay | Admitting: Physical Therapy

## 2015-01-11 ENCOUNTER — Ambulatory Visit: Payer: Medicare HMO | Attending: Orthopaedic Surgery | Admitting: Physical Therapy

## 2015-01-11 DIAGNOSIS — R531 Weakness: Secondary | ICD-10-CM | POA: Diagnosis present

## 2015-01-11 DIAGNOSIS — M25622 Stiffness of left elbow, not elsewhere classified: Secondary | ICD-10-CM | POA: Insufficient documentation

## 2015-01-11 DIAGNOSIS — M2560 Stiffness of unspecified joint, not elsewhere classified: Secondary | ICD-10-CM | POA: Diagnosis present

## 2015-01-11 DIAGNOSIS — R609 Edema, unspecified: Secondary | ICD-10-CM | POA: Insufficient documentation

## 2015-01-11 DIAGNOSIS — IMO0002 Reserved for concepts with insufficient information to code with codable children: Secondary | ICD-10-CM

## 2015-01-11 NOTE — Therapy (Signed)
[facility] [address] [place], [place], [address] Phone: [phone]   Fax:  [phone]  Physical Therapy Evaluation  Patient Details  Name: Ashley Riddle MRN: [id number] Date of Birth: 1945/10/10 Referring Provider:  Garald Balding, MD  Encounter Date: 01/11/2015      PT End of Session - 01/11/15 1513    Visit Number 1   Number of Visits 10  Medicare   Date for PT Re-Evaluation 03/08/15   PT Start Time 3832   PT Stop Time 1515   PT Time Calculation (min) 30 min   Activity Tolerance Patient tolerated treatment well   Behavior During Therapy [facility] for tasks assessed/performed      Past Medical History  Diagnosis Date  . Arthritis   . Hypertension   . DDD (degenerative disc disease), lumbar   . DDD (degenerative disc disease), cervical   . HTN (hypertension)   . Gout   . Anxiety   . Constipation   . Hepatitis C     treated  . Pneumonia 12/02/13  . Heart murmur     probable bicuspid AV with mild AS, mild MR by 04/22/14 Echo (Dr. Montez Morita)    Past Surgical History  Procedure Laterality Date  . Abdominal hysterectomy    . Cesarean section      x 3  . Back surgery    . Appendectomy    . Inner ear surgery      blood vessel  . Tonsillectomy    . Colonoscopy w/ polypectomy    . Total shoulder arthroplasty Left 12/27/2014    Procedure: TOTAL SHOULDER ARTHROPLASTY;  Surgeon: Garald Balding, MD;  Location: [place];  Service: Orthopedics;  Laterality: Left;    There were no vitals filed for this visit.  Visit Diagnosis:  Stiffness of elbow joint, left - Plan: PT plan of care cert/re-cert  Stiffness of extremity - Plan: PT plan of care cert/re-cert  Edema - Plan: PT plan of care cert/re-cert  Weakness - Plan: PT plan of care cert/re-cert      Subjective Assessment - 01/11/15 1448    Subjective Patient had a left total shoulder replacement on 12/27/2014.  Patient was in the hospital for 2  days.  No home health.  Patient came to therapy with a sling.    Patient Stated Goals move left arm again   Currently in Pain? Yes   Pain Score 8    Pain Location Shoulder   Pain Orientation Left   Pain Descriptors / Indicators Throbbing   Pain Type Surgical pain   Pain Onset 1 to 4 weeks ago   Pain Frequency Constant   Aggravating Factors  movement   Pain Relieving Factors keep arm at side   Multiple Pain Sites No            [facility] PT Assessment - 01/11/15 0001    Assessment   Medical Diagnosis S/P left total shoulder replacement   Onset Date/Surgical Date 12/27/14   Hand Dominance Right   Precautions   Precautions Shoulder   Precaution Comments Passive ROM as per MD   Balance Screen   Has the patient fallen in the past 6 months No   Has the patient had a decrease in activity level because of a fear of falling?  No   Is the patient reluctant to leave their home because of a fear of falling?  No   Home Ecologist residence  Living Arrangements Alone   Available Help at Discharge Family  lives with her daughter right now   Prior Function   Level of Independence Needs assistance with ADLs   Toileting Minimal   Dressing Moderate   Grooming Moderate   Cognition   Overall Cognitive Status Within Functional Limits for tasks assessed   Observation/Other Assessments   Skin Integrity Surgical site is healing with sterri strips on anterior left shoulder   Focus on Therapeutic Outcomes (FOTO)  84% limitation   Observation/Other Assessments-Edema    Edema Circumferential   Circumferential Edema   Circumferential - Right 21 cm  around palm   Circumferential - Left  22 cm   ROM / Strength   AROM / PROM / Strength Strength   PROM   PROM Assessment Site Shoulder;Elbow;Wrist   Right/Left Shoulder Left   Left Shoulder Flexion 30 Degrees   Left Shoulder ABduction 30 Degrees   Left Shoulder External Rotation -15 Degrees  0 degree abduction    Right/Left Elbow Left   Left Elbow Flexion 105   Left Elbow Extension -40   Right/Left Wrist Left   Left Wrist Extension 40 Degrees   Left Wrist Flexion 45 Degrees   Strength   Overall Strength Unable to assess  for left shoulder due to PROM orders   Strength Assessment Site Hand;Shoulder   Right/Left Shoulder Left   Right Hand Gross Grasp Impaired  40#   Left Hand Gross Grasp Impaired  8#                           PT Education - 01/11/15 1516    Education provided Yes   Education Details gave patient a ball to squeeze    Person(s) Educated Patient   Methods Explanation;Demonstration;Tactile cues;Verbal cues;Handout   Comprehension Returned demonstration;Verbalized understanding          PT Short Term Goals - 01/11/15 1525    PT SHORT TERM GOAL #1   Title independent with initial HEP   Time 4   Period Weeks   Status New   PT SHORT TERM GOAL #2   Title PROM of left shoulder for flexion >/= 90 degrees   Time 4   Period Weeks   Status New   PT SHORT TERM GOAL #3   Title PROM of left shoulder for abduction >/= 70 degrees   Time 4   Period Weeks   Status New   PT SHORT TERM GOAL #4   Title pain with daily activities decreased >/= 4/10   Time 4   Status New           PT Long Term Goals - 01/11/15 1527    PT LONG TERM GOAL #1   Title Indepenent with HEP and understand how to progress   Baseline not educated yet   Time 8   Period Weeks   Status New   PT LONG TERM GOAL #2   Title left shoulder AROM >/= 90 degrees so she can reach at shoulder height   Baseline 0 degrees as per MD orders   Time 8   Period Weeks   Status New   PT LONG TERM GOAL #3   Title left shoulder AROM abduction >/= 80 degrees so she can wash herself   Baseline 0 degrees as per MD orders   Time 8   Period Weeks   Status New   PT LONG TERM GOAL #4   Title ability  to dress herself due to left shoulder strength >/= 3+/5   Baseline unable to dress herself   Time 8    Period Weeks   Status New   PT LONG TERM GOAL #5   Title grip strength on left >/= 30 pounds to hold items in left hand   Baseline 8 pounds   Time 8   Period Weeks   Status New               Plan - 02-04-2015 1518    Clinical Impression Statement Patient is a 69 year old female with diagnosis of left total shoulder replacement on 12/27/2014.  Patient was in the hospital for 2 days.  Patient came to PT with a sling on left arm. Patient reports her pain in left shoulder is 8/10 intermittently  with movement. FOTO score is 84% limitation.  Surgical site is healing with sterri strips.  Edema in left hand measuring 42 cm and right is 41 cm.  Grip strength on the right is 40# and left is 8#.  PROM of left shoulder in degrees is as follows:fleixon 30, abduction 30, and ER at 0 degrees abduction  is -15.  Left elbow PROM in degrees: flexion 105 and extension -40.  Left wrist PROM in degrees is flexion 45 and extension 45.  Patients orders are to have PROM  to left shoulder at this time.  Patient would benefit from physical  therapy to improve left shoulder, elbow, and wrist ROM  and improve strength to return to prior functional level.    Pt will benefit from skilled therapeutic intervention in order to improve on the following deficits Pain;Increased muscle spasms;Decreased scar mobility;Decreased activity tolerance;Decreased endurance;Decreased range of motion;Decreased strength;Impaired UE functional use;Increased edema   Rehab Potential Excellent   PT Frequency 2x / week   PT Duration 8 weeks   PT Treatment/Interventions ADLs/Self Care Home Management;Electrical Stimulation;Ultrasound;Moist Heat;Therapeutic activities;Therapeutic exercise;Neuromuscular re-education;Manual techniques;Patient/family education;Scar mobilization;Passive range of motion   PT Next Visit Plan PROM to left shoulder, elbow, wrist.  Modalities as needed. Grip strenght   PT Home Exercise Plan PROM for home   Recommended  Other Services None   Consulted and Agree with Plan of Care Patient          G-Codes - 2015-02-04 1531    Functional Assessment Tool Used FOTO 84% limitaiton    Functional Limitation Other PT primary   Other PT Primary Current Status (X6468) At least 80 percent but less than 100 percent impaired, limited or restricted   Other PT Primary Goal Status (E3212) At least 40 percent but less than 60 percent impaired, limited or restricted       Problem List Patient Active Problem List   Diagnosis Date Noted  . Fracture of glenoid process of left scapula 12/29/2014  . Status post total shoulder arthroplasty 12/27/2014  . Cocaine dependence in remission 09/29/2014  . Severe heroin dependence in sustained remission 09/29/2014  . Opiate abuse, episodic 09/29/2014  . Mild tetrahydrocannabinol (THC) abuse 09/29/2014  . Adjustment disorder with depressed mood 09/29/2014  . Chronic pain syndrome 04/18/2014  . Primary osteoarthritis of left shoulder 04/18/2014  . Primary osteoarthritis of right knee 04/18/2014  . Primary osteoarthritis of left knee 04/18/2014  . ANXIETY 03/22/2010  . HYPERTENSION, BENIGN ESSENTIAL 03/22/2010  . ASTHMA 03/22/2010  . CONSTIPATION 03/22/2010  . HEPATITIS C 06/04/1995    Arieh Bogue,PT 02-04-2015, 3:34 PM  [facility] [address] [place],  [place], [address] Phone: [phone]   Fax:  [phone]

## 2015-01-31 ENCOUNTER — Ambulatory Visit: Payer: Medicare HMO

## 2015-01-31 DIAGNOSIS — M2560 Stiffness of unspecified joint, not elsewhere classified: Secondary | ICD-10-CM

## 2015-01-31 DIAGNOSIS — R531 Weakness: Secondary | ICD-10-CM

## 2015-01-31 DIAGNOSIS — M25622 Stiffness of left elbow, not elsewhere classified: Secondary | ICD-10-CM

## 2015-01-31 DIAGNOSIS — R609 Edema, unspecified: Secondary | ICD-10-CM

## 2015-01-31 DIAGNOSIS — IMO0002 Reserved for concepts with insufficient information to code with codable children: Secondary | ICD-10-CM

## 2015-01-31 NOTE — Therapy (Signed)
[facility] [address], [place], [place], [address] Phone: [phone]   Fax:  [phone]  Physical Therapy Treatment  Patient Details  Name: Ashley Riddle MRN: [id number] Date of Birth: 11-11-1945 Referring Provider:  Garald Balding, MD  Encounter Date: 01/31/2015      PT End of Session - 01/31/15 0840    Visit Number 2   Number of Visits 10  Medicare   Date for PT Re-Evaluation 03/08/15   Authorization Type Medicaid   Authorization Time Period 01/16/15-03/12/15   Authorization - Visit Number 1   Authorization - Number of Visits 8   PT Start Time 0801   PT Stop Time 0856   PT Time Calculation (min) 55 min   Activity Tolerance Patient tolerated treatment well   Behavior During Therapy [facility] for tasks assessed/performed      Past Medical History  Diagnosis Date  . Arthritis   . Hypertension   . DDD (degenerative disc disease), lumbar   . DDD (degenerative disc disease), cervical   . HTN (hypertension)   . Gout   . Anxiety   . Constipation   . Hepatitis C     treated  . Pneumonia 12/02/13  . Heart murmur     probable bicuspid AV with mild AS, mild MR by 04/22/14 Echo (Dr. Montez Morita)    Past Surgical History  Procedure Laterality Date  . Abdominal hysterectomy    . Cesarean section      x 3  . Back surgery    . Appendectomy    . Inner ear surgery      blood vessel  . Tonsillectomy    . Colonoscopy w/ polypectomy    . Total shoulder arthroplasty Left 12/27/2014    Procedure: TOTAL SHOULDER ARTHROPLASTY;  Surgeon: Garald Balding, MD;  Location: [place];  Service: Orthopedics;  Laterality: Left;    There were no vitals filed for this visit.  Visit Diagnosis:  Stiffness of elbow joint, left  Stiffness of extremity  Weakness  Edema      Subjective Assessment - 01/31/15 0800    Subjective Pt has been squeezing her ball.     Patient is accompained by: Family member   Currently in  Pain? Yes   Pain Score 8    Pain Location Shoulder   Pain Orientation Left   Pain Descriptors / Indicators Throbbing   Pain Type Surgical pain   Pain Onset 1 to 4 weeks ago   Pain Frequency Constant   Aggravating Factors  moving the arm   Pain Relieving Factors not moving arm, pain medication   Multiple Pain Sites No                         [facility] Adult PT Treatment/Exercise - 01/31/15 0001    Exercises   Exercises Elbow;Shoulder;Hand   Elbow Exercises   Elbow Flexion AROM;Left;20 reps   Shoulder Exercises: Seated   Other Seated Exercises shoulder shrugs and circles x 10 each   Hand Exercises   Other Hand Exercises Digiflex: red x 3 minutes   Modalities   Modalities Cryotherapy;Electrical Stimulation   Cryotherapy   Number Minutes Cryotherapy 15 Minutes   Cryotherapy Location Shoulder   Type of Cryotherapy Ice pack   Electrical Stimulation   Electrical Stimulation Location Lt shoulder   Electrical Stimulation Action IFC   Electrical Stimulation Parameters 15 minutes   Electrical Stimulation Goals Pain   Manual  Therapy   Manual Therapy Passive ROM   Passive ROM PROM Lt shoulder into IR/ER and flexion to tolerance and protocol limits.  PROM into Lt elbow flexion/extension and forearm supination.                  PT Education - 01/31/15 0815    Education provided Yes   Education Details shoulder shrugs, circles, elbow flexion/extension, mass grip   Person(s) Educated Patient   Methods Explanation;Demonstration;Handout   Comprehension Verbalized understanding;Returned demonstration          PT Short Term Goals - 01/31/15 0816    PT SHORT TERM GOAL #1   Title independent with initial HEP   Time 4   Period Weeks   Status On-going  issued HEP today   PT SHORT TERM GOAL #2   Title PROM of left shoulder for flexion >/= 90 degrees   PT SHORT TERM GOAL #4   Title pain with daily activities decreased >/= 4/10   Time 4   Status On-going  8/10  today           PT Long Term Goals - 01/11/15 1527    PT LONG TERM GOAL #1   Title Indepenent with HEP and understand how to progress   Baseline not educated yet   Time 8   Period Weeks   Status New   PT LONG TERM GOAL #2   Title left shoulder AROM >/= 90 degrees so she can reach at shoulder height   Baseline 0 degrees as per MD orders   Time 8   Period Weeks   Status New   PT LONG TERM GOAL #3   Title left shoulder AROM abduction >/= 80 degrees so she can wash herself   Baseline 0 degrees as per MD orders   Time 8   Period Weeks   Status New   PT LONG TERM GOAL #4   Title ability to dress herself due to left shoulder strength >/= 3+/5   Baseline unable to dress herself   Time 8   Period Weeks   Status New   PT LONG TERM GOAL #5   Title grip strength on left >/= 30 pounds to hold items in left hand   Baseline 8 pounds   Time 8   Period Weeks   Status New               Plan - 01/31/15 8588    Clinical Impression Statement Pt with only 1 session after evaluation.  Pt with limited Lt shoulder, elbow, and grip strength s/p Lt Total shoulder replacement.  Pt is limited by protocol at this time.  PT initiated HEP for pt today.  Pt will benfit from skilled PT to improve grip and Lt UE AROm and strength for return to regular use of Lt UE and work.     Pt will benefit from skilled therapeutic intervention in order to improve on the following deficits Pain;Increased muscle spasms;Decreased scar mobility;Decreased activity tolerance;Decreased endurance;Decreased range of motion;Decreased strength;Impaired UE functional use;Increased edema   Rehab Potential Excellent   PT Frequency 2x / week   PT Duration 8 weeks   PT Treatment/Interventions ADLs/Self Care Home Management;Electrical Stimulation;Ultrasound;Moist Heat;Therapeutic activities;Therapeutic exercise;Neuromuscular re-education;Manual techniques;Patient/family education;Scar mobilization;Passive range of motion   PT  Next Visit Plan PROM to left shoulder, elbow, wrist.  Modalities as needed. Grip strength.  Follow protocol.     Consulted and Agree with Plan of Care Patient  Problem List Patient Active Problem List   Diagnosis Date Noted  . Fracture of glenoid process of left scapula 12/29/2014  . Status post total shoulder arthroplasty 12/27/2014  . Cocaine dependence in remission 09/29/2014  . Severe heroin dependence in sustained remission 09/29/2014  . Opiate abuse, episodic 09/29/2014  . Mild tetrahydrocannabinol (THC) abuse 09/29/2014  . Adjustment disorder with depressed mood 09/29/2014  . Chronic pain syndrome 04/18/2014  . Primary osteoarthritis of left shoulder 04/18/2014  . Primary osteoarthritis of right knee 04/18/2014  . Primary osteoarthritis of left knee 04/18/2014  . ANXIETY 03/22/2010  . HYPERTENSION, BENIGN ESSENTIAL 03/22/2010  . ASTHMA 03/22/2010  . CONSTIPATION 03/22/2010  . HEPATITIS C 06/04/1995    TAKACS,KELLY, PT 01/31/2015, 8:43 AM  [facility] [address] [place], [place], [address] Phone: [phone]   Fax:  [phone]

## 2015-01-31 NOTE — Patient Instructions (Signed)
AROM: Elbow Flexion / Extension   With left hand palm up, gently bend elbow as far as possible. Then straighten arm as far as possible. Repeat _10__ times per set. Do _1___ sets per session. Do _many___ sessions per day.  Copyright  VHI. All rights reserved.  Paper Crumpling Exercise/ball squeeze   Begin with right palm down on piece of paper. Maintaining contact between surface and heel of hand, crumple paper into a ball. Repeat _many__ times per set. Do ____ sets per session. Do ____ sessions per day.  Copyright  VHI. All rights reserved.  Elevation: Shrug (Distal Resist)   Lift shoulders straight up, then return. Maintain same speed up and down. Avoid moving head and neck forward. Repeat _10___ times per set. Do __1-2__ sets per session.  Use ____ lb weights.  Copyright  VHI. All rights reserved.

## 2015-02-01 ENCOUNTER — Encounter: Payer: Self-pay | Admitting: Physical Therapy

## 2015-02-01 ENCOUNTER — Ambulatory Visit: Payer: Medicare HMO | Admitting: Physical Therapy

## 2015-02-01 DIAGNOSIS — R531 Weakness: Secondary | ICD-10-CM

## 2015-02-01 DIAGNOSIS — M2560 Stiffness of unspecified joint, not elsewhere classified: Secondary | ICD-10-CM

## 2015-02-01 DIAGNOSIS — IMO0002 Reserved for concepts with insufficient information to code with codable children: Secondary | ICD-10-CM

## 2015-02-01 DIAGNOSIS — M25622 Stiffness of left elbow, not elsewhere classified: Secondary | ICD-10-CM

## 2015-02-01 DIAGNOSIS — R609 Edema, unspecified: Secondary | ICD-10-CM

## 2015-02-01 NOTE — Therapy (Signed)
[facility] [address], [place], [place], [address] Phone: [phone]   Fax:  [phone]  Physical Therapy Treatment  Patient Details  Name: Ashley Riddle MRN: [id number] Date of Birth: 1945/11/23 Referring Provider:  Wallene Huh, MD  Encounter Date: 02/01/2015      PT End of Session - 02/01/15 1307    Visit Number 3   Number of Visits 10   Date for PT Re-Evaluation 03/08/15   Authorization Type Medicaid   Authorization Time Period 01/16/15-03/12/15   Authorization - Visit Number 2   Authorization - Number of Visits 8   PT Start Time 1233   PT Stop Time 1325   PT Time Calculation (min) 52 min   Activity Tolerance Patient tolerated treatment well   Behavior During Therapy [facility] for tasks assessed/performed      Past Medical History  Diagnosis Date  . Arthritis   . Hypertension   . DDD (degenerative disc disease), lumbar   . DDD (degenerative disc disease), cervical   . HTN (hypertension)   . Gout   . Anxiety   . Constipation   . Hepatitis C     treated  . Pneumonia 12/02/13  . Heart murmur     probable bicuspid AV with mild AS, mild MR by 04/22/14 Echo (Dr. Montez Morita)    Past Surgical History  Procedure Laterality Date  . Abdominal hysterectomy    . Cesarean section      x 3  . Back surgery    . Appendectomy    . Inner ear surgery      blood vessel  . Tonsillectomy    . Colonoscopy w/ polypectomy    . Total shoulder arthroplasty Left 12/27/2014    Procedure: TOTAL SHOULDER ARTHROPLASTY;  Surgeon: Garald Balding, MD;  Location: [place];  Service: Orthopedics;  Laterality: Left;    There were no vitals filed for this visit.  Visit Diagnosis:  Stiffness of elbow joint, left  Stiffness of extremity  Weakness  Edema      Subjective Assessment - 02/01/15 1235    Subjective Pain is pretty high today, unsure why.   Currently in Pain? Yes   Pain Score 8    Pain Location Shoulder   Pain Orientation Left   Pain Descriptors / Indicators Throbbing   Aggravating Factors  Not sure today   Pain Relieving Factors Meds   Multiple Pain Sites No            [facility] PT Assessment - 02/01/15 0001    PROM   Left Shoulder Flexion 50 Degrees                     [facility] Adult PT Treatment/Exercise - 02/01/15 0001    Moist Heat Therapy   Number Minutes Moist Heat 15 Minutes   Moist Heat Location Shoulder   Electrical Stimulation   Electrical Stimulation Location Lt shoulder   Electrical Stimulation Action IFC   Electrical Stimulation Parameters 15   Manual Therapy   Manual Therapy Soft tissue mobilization;Passive ROM  PROM Lt forearm, elbow, shld   Soft tissue mobilization Lt shoulder, bicep and foreamr   Passive ROM PROM Lt shoulder into IR/ER and flexion to tolerance and protocol limits.  PROM into Lt elbow flexion/extension and forearm supination.                  PT Education - 01/31/15 0815    Education provided Yes  Education Details shoulder shrugs, circles, elbow flexion/extension, mass grip   Person(s) Educated Patient   Methods Explanation;Demonstration;Handout   Comprehension Verbalized understanding;Returned demonstration          PT Short Term Goals - 01/31/15 0816    PT SHORT TERM GOAL #1   Title independent with initial HEP   Time 4   Period Weeks   Status On-going  issued HEP today   PT SHORT TERM GOAL #2   Title PROM of left shoulder for flexion >/= 90 degrees   PT SHORT TERM GOAL #4   Title pain with daily activities decreased >/= 4/10   Time 4   Status On-going  8/10 today           PT Long Term Goals - 01/11/15 1527    PT LONG TERM GOAL #1   Title Indepenent with HEP and understand how to progress   Baseline not educated yet   Time 8   Period Weeks   Status New   PT LONG TERM GOAL #2   Title left shoulder AROM >/= 90 degrees so she can reach at shoulder height   Baseline 0 degrees as per MD orders    Time 8   Period Weeks   Status New   PT LONG TERM GOAL #3   Title left shoulder AROM abduction >/= 80 degrees so she can wash herself   Baseline 0 degrees as per MD orders   Time 8   Period Weeks   Status New   PT LONG TERM GOAL #4   Title ability to dress herself due to left shoulder strength >/= 3+/5   Baseline unable to dress herself   Time 8   Period Weeks   Status New   PT LONG TERM GOAL #5   Title grip strength on left >/= 30 pounds to hold items in left hand   Baseline 8 pounds   Time 8   Period Weeks   Status New               Plan - 02/01/15 1308    Clinical Impression Statement Pt did not tolerate PROM well today. Pt cried for first half. After doing soft tissue work to shoulder she seemed to tolerate PROM better. Everything limited by pain. Pt reports she stays in the sling all the time and doesn't do much except squeeze the ball in her sling.   Pt will benefit from skilled therapeutic intervention in order to improve on the following deficits Pain;Increased muscle spasms;Decreased scar mobility;Decreased activity tolerance;Decreased endurance;Decreased range of motion;Decreased strength;Impaired UE functional use;Increased edema   Rehab Potential Excellent   PT Frequency 2x / week   PT Duration 8 weeks   PT Treatment/Interventions ADLs/Self Care Home Management;Electrical Stimulation;Ultrasound;Moist Heat;Therapeutic activities;Therapeutic exercise;Neuromuscular re-education;Manual techniques;Patient/family education;Scar mobilization;Passive range of motion   PT Next Visit Plan PROM to left shoulder, elbow, wrist.  Modalities as needed. Grip strength.  Follow protocol.     Consulted and Agree with Plan of Care Patient        Problem List Patient Active Problem List   Diagnosis Date Noted  . Fracture of glenoid process of left scapula 12/29/2014  . Status post total shoulder arthroplasty 12/27/2014  . Cocaine dependence in remission 09/29/2014  . Severe  heroin dependence in sustained remission 09/29/2014  . Opiate abuse, episodic 09/29/2014  . Mild tetrahydrocannabinol (THC) abuse 09/29/2014  . Adjustment disorder with depressed mood 09/29/2014  . Chronic pain syndrome 04/18/2014  . Primary  osteoarthritis of left shoulder 04/18/2014  . Primary osteoarthritis of right knee 04/18/2014  . Primary osteoarthritis of left knee 04/18/2014  . ANXIETY 03/22/2010  . HYPERTENSION, BENIGN ESSENTIAL 03/22/2010  . ASTHMA 03/22/2010  . CONSTIPATION 03/22/2010  . HEPATITIS C 06/04/1995    Adriona Kaney, PTA 02/01/2015, 1:11 PM  [facility] [address], [place], [place], [address] Phone: [phone]   Fax:  [phone]

## 2015-02-08 ENCOUNTER — Encounter: Payer: Self-pay | Admitting: Physical Therapy

## 2015-02-08 ENCOUNTER — Ambulatory Visit: Payer: Medicare HMO | Attending: Orthopaedic Surgery | Admitting: Physical Therapy

## 2015-02-08 DIAGNOSIS — R531 Weakness: Secondary | ICD-10-CM | POA: Insufficient documentation

## 2015-02-08 DIAGNOSIS — M2560 Stiffness of unspecified joint, not elsewhere classified: Secondary | ICD-10-CM | POA: Insufficient documentation

## 2015-02-08 DIAGNOSIS — M25622 Stiffness of left elbow, not elsewhere classified: Secondary | ICD-10-CM | POA: Insufficient documentation

## 2015-02-08 DIAGNOSIS — IMO0002 Reserved for concepts with insufficient information to code with codable children: Secondary | ICD-10-CM

## 2015-02-08 DIAGNOSIS — R609 Edema, unspecified: Secondary | ICD-10-CM | POA: Insufficient documentation

## 2015-02-08 NOTE — Therapy (Signed)
[facility] [address] [place], [place], [address] Phone: [phone]   Fax:  [phone]  Physical Therapy Treatment  Patient Details  Name: Ashley Riddle MRN: [id number] Date of Birth: April 10, 1946 Referring Provider:  Garald Balding, MD  Encounter Date: 02/08/2015      PT End of Session - 02/08/15 1221    Visit Number 4   Number of Visits 10   Date for PT Re-Evaluation 03/08/15   Authorization Type Medicaid   Authorization Time Period 01/16/15-03/12/15   Authorization - Visit Number 4   Authorization - Number of Visits 8   PT Start Time 3382   PT Stop Time 1222   PT Time Calculation (min) 40 min   Activity Tolerance Patient limited by pain   Behavior During Therapy Anxious  Teary throughout      Past Medical History  Diagnosis Date  . Arthritis   . Hypertension   . DDD (degenerative disc disease), lumbar   . DDD (degenerative disc disease), cervical   . HTN (hypertension)   . Gout   . Anxiety   . Constipation   . Hepatitis C     treated  . Pneumonia 12/02/13  . Heart murmur     probable bicuspid AV with mild AS, mild MR by 04/22/14 Echo (Dr. Montez Morita)    Past Surgical History  Procedure Laterality Date  . Abdominal hysterectomy    . Cesarean section      x 3  . Back surgery    . Appendectomy    . Inner ear surgery      blood vessel  . Tonsillectomy    . Colonoscopy w/ polypectomy    . Total shoulder arthroplasty Left 12/27/2014    Procedure: TOTAL SHOULDER ARTHROPLASTY;  Surgeon: Garald Balding, MD;  Location: [place];  Service: Orthopedics;  Laterality: Left;    There were no vitals filed for this visit.  Visit Diagnosis:  Stiffness of extremity  Stiffness of elbow joint, left      Subjective Assessment - 02/08/15 1218    Subjective Staying in the sling pretty much all day/night. She thinks she may have MD appt on Friday but she is not sure. She will check on this.   Currently in Pain? Yes   Pain Score 8    Pain Location Shoulder   Pain Orientation Left   Pain Descriptors / Indicators Constant;Throbbing   Aggravating Factors  Not sure   Pain Relieving Factors Getting it rubbed in therapy   Multiple Pain Sites No                         [facility] Adult PT Treatment/Exercise - 02/08/15 0001    Manual Therapy   Soft tissue mobilization Lt shoulder, elbow/bicept, forearm, pectoralis  Applied biofreeze to shld and arm after   Passive ROM PROM LT shoulder fllexion, scaption, ER  All to tolerance                  PT Short Term Goals - 02/08/15 1224    PT SHORT TERM GOAL #1   Title independent with initial HEP   Time 4   Period Weeks   Status Partially Met  Mostly does her grip exercises   PT SHORT TERM GOAL #2   Title PROM of left shoulder for flexion >/= 90 degrees   Time 4   Period Weeks   Status On-going  45-50 degrees  passive   PT SHORT TERM GOAL #3   Title PROM of left shoulder for abduction >/= 70 degrees   Time 4   Period Weeks   Status On-going  30-40 degrees   PT SHORT TERM GOAL #4   Title pain with daily activities decreased >/= 4/10   Status --  8/10           PT Long Term Goals - 01/11/15 1527    PT LONG TERM GOAL #1   Title Indepenent with HEP and understand how to progress   Baseline not educated yet   Time 8   Period Weeks   Status New   PT LONG TERM GOAL #2   Title left shoulder AROM >/= 90 degrees so she can reach at shoulder height   Baseline 0 degrees as per MD orders   Time 8   Period Weeks   Status New   PT LONG TERM GOAL #3   Title left shoulder AROM abduction >/= 80 degrees so she can wash herself   Baseline 0 degrees as per MD orders   Time 8   Period Weeks   Status New   PT LONG TERM GOAL #4   Title ability to dress herself due to left shoulder strength >/= 3+/5   Baseline unable to dress herself   Time 8   Period Weeks   Status New   PT LONG TERM GOAL #5   Title  grip strength on left >/= 30 pounds to hold items in left hand   Baseline 8 pounds   Time 8   Period Weeks   Status New               Plan - 02/08/15 1223    Clinical Impression Statement Tolerated PROm slightly better today. Pt remains teary throughout. Pt definitely does better if you intertwine the soft tissue work with the PROM. She may see MD on Friday, will let us know.    Pt will benefit from skilled therapeutic intervention in order to improve on the following deficits Pain;Increased muscle spasms;Decreased scar mobility;Decreased activity tolerance;Decreased endurance;Decreased range of motion;Decreased strength;Impaired UE functional use;Increased edema   Rehab Potential Excellent   PT Duration 8 weeks   PT Treatment/Interventions ADLs/Self Care Home Management;Electrical Stimulation;Ultrasound;Moist Heat;Therapeutic activities;Therapeutic exercise;Neuromuscular re-education;Manual techniques;Patient/family education;Scar mobilization;Passive range of motion   PT Next Visit Plan PROM to left shoulder, elbow, wrist.  Modalities as needed. Grip strength.  Follow protocol.     Consulted and Agree with Plan of Care Patient        Problem List Patient Active Problem List   Diagnosis Date Noted  . Fracture of glenoid process of left scapula 12/29/2014  . Status post total shoulder arthroplasty 12/27/2014  . Cocaine dependence in remission 09/29/2014  . Severe heroin dependence in sustained remission 09/29/2014  . Opiate abuse, episodic 09/29/2014  . Mild tetrahydrocannabinol (THC) abuse 09/29/2014  . Adjustment disorder with depressed mood 09/29/2014  . Chronic pain syndrome 04/18/2014  . Primary osteoarthritis of left shoulder 04/18/2014  . Primary osteoarthritis of right knee 04/18/2014  . Primary osteoarthritis of left knee 04/18/2014  . ANXIETY 03/22/2010  . HYPERTENSION, BENIGN ESSENTIAL 03/22/2010  . ASTHMA 03/22/2010  . CONSTIPATION 03/22/2010  . HEPATITIS C  06/04/1995    Ryson Bacha, PTA 02/08/2015, 12:30 PM  [facility] [address] [place], [place], [address] Phone: [phone]   Fax:  [phone]

## 2015-02-10 ENCOUNTER — Ambulatory Visit: Payer: Medicare HMO | Admitting: Physical Therapy

## 2015-02-10 ENCOUNTER — Encounter: Payer: Self-pay | Admitting: Physical Therapy

## 2015-02-10 DIAGNOSIS — IMO0002 Reserved for concepts with insufficient information to code with codable children: Secondary | ICD-10-CM

## 2015-02-10 DIAGNOSIS — M2560 Stiffness of unspecified joint, not elsewhere classified: Secondary | ICD-10-CM | POA: Diagnosis not present

## 2015-02-10 DIAGNOSIS — M25622 Stiffness of left elbow, not elsewhere classified: Secondary | ICD-10-CM

## 2015-02-10 NOTE — Therapy (Signed)
[facility] [address], [place], [place], [address] Phone: [phone]   Fax:  [phone]  Physical Therapy Treatment  Patient Details  Name: Ashley Riddle MRN: [id number] Date of Birth: Oct 28, 1945 Referring Provider:  Garald Balding, MD  Encounter Date: 02/10/2015      PT End of Session - 02/10/15 0859    Visit Number 5   Number of Visits 10   Date for PT Re-Evaluation 03/08/15   Authorization Type Medicaid   Authorization Time Period 01/16/15-03/12/15   Authorization - Visit Number 5   Authorization - Number of Visits 8   PT Start Time 5364   PT Stop Time 0925   PT Time Calculation (min) 43 min   Activity Tolerance Patient limited by pain   Behavior During Therapy [facility] for tasks assessed/performed      Past Medical History  Diagnosis Date  . Arthritis   . Hypertension   . DDD (degenerative disc disease), lumbar   . DDD (degenerative disc disease), cervical   . HTN (hypertension)   . Gout   . Anxiety   . Constipation   . Hepatitis C     treated  . Pneumonia 12/02/13  . Heart murmur     probable bicuspid AV with mild AS, mild MR by 04/22/14 Echo (Dr. Montez Morita)    Past Surgical History  Procedure Laterality Date  . Abdominal hysterectomy    . Cesarean section      x 3  . Back surgery    . Appendectomy    . Inner ear surgery      blood vessel  . Tonsillectomy    . Colonoscopy w/ polypectomy    . Total shoulder arthroplasty Left 12/27/2014    Procedure: TOTAL SHOULDER ARTHROPLASTY;  Surgeon: Garald Balding, MD;  Location: [place];  Service: Orthopedics;  Laterality: Left;    There were no vitals filed for this visit.  Visit Diagnosis:  Stiffness of extremity  Stiffness of elbow joint, left      Subjective Assessment - 02/10/15 0850    Subjective Pain is less than it was. Pt reports able to wash under her LT arm with greater ease.    Currently in Pain? Yes   Pain Score 6   with  meds   Pain Location Shoulder   Pain Orientation Left   Pain Descriptors / Indicators Aching   Aggravating Factors  Night time worse   Pain Relieving Factors Massage, meds help   Multiple Pain Sites No            [facility] PT Assessment - 02/10/15 0001    PROM   Left Shoulder Flexion 50 Degrees   Left Shoulder ABduction 60 Degrees   Left Shoulder External Rotation 20 Degrees   Left Elbow Extension -30                     [facility] Adult PT Treatment/Exercise - 02/10/15 0001    Manual Therapy   Soft tissue mobilization Lt shoulder, elbow/bicept, forearm, pectoralis  Applied biofreeze to shld and arm after   Passive ROM PROM LT shoulder fllexion, scaption, ER  All to tolerance                  PT Short Term Goals - 02/10/15 6803    PT SHORT TERM GOAL #1   Title independent with initial HEP   Time 4   Period Weeks   Status Partially Met  PT SHORT TERM GOAL #2   Title PROM of left shoulder for flexion >/= 90 degrees   Time 4   Period Weeks   Status On-going  50 today   PT SHORT TERM GOAL #3   Title PROM of left shoulder for abduction >/= 70 degrees   Time 4   Period Weeks   Status On-going  60    PT SHORT TERM GOAL #4   Title pain with daily activities decreased >/= 4/10   Time 4   Period Weeks   Status On-going  Ranges 6-8/10           PT Long Term Goals - 01/11/15 1527    PT LONG TERM GOAL #1   Title Indepenent with HEP and understand how to progress   Baseline not educated yet   Time 8   Period Weeks   Status New   PT LONG TERM GOAL #2   Title left shoulder AROM >/= 90 degrees so she can reach at shoulder height   Baseline 0 degrees as per MD orders   Time 8   Period Weeks   Status New   PT LONG TERM GOAL #3   Title left shoulder AROM abduction >/= 80 degrees so she can wash herself   Baseline 0 degrees as per MD orders   Time 8   Period Weeks   Status New   PT LONG TERM GOAL #4   Title ability to dress herself due to left  shoulder strength >/= 3+/5   Baseline unable to dress herself   Time 8   Period Weeks   Status New   PT LONG TERM GOAL #5   Title grip strength on left >/= 30 pounds to hold items in left hand   Baseline 8 pounds   Time 8   Period Weeks   Status New               Plan - 02/10/15 2446    Clinical Impression Statement PROM slightly better tha on eval. PROM limited by pain and tightness. Remains with soft tissue restrictions at LT bicep and upper arm.    Pt will benefit from skilled therapeutic intervention in order to improve on the following deficits Pain;Increased muscle spasms;Decreased scar mobility;Decreased activity tolerance;Decreased endurance;Decreased range of motion;Decreased strength;Impaired UE functional use;Increased edema   Rehab Potential Excellent   PT Frequency 2x / week   PT Duration 8 weeks   PT Treatment/Interventions ADLs/Self Care Home Management;Electrical Stimulation;Ultrasound;Moist Heat;Therapeutic activities;Therapeutic exercise;Neuromuscular re-education;Manual techniques;Patient/family education;Scar mobilization;Passive range of motion   PT Next Visit Plan To MD MOnday   Consulted and Agree with Plan of Care Patient        Problem List Patient Active Problem List   Diagnosis Date Noted  . Fracture of glenoid process of left scapula 12/29/2014  . Status post total shoulder arthroplasty 12/27/2014  . Cocaine dependence in remission 09/29/2014  . Severe heroin dependence in sustained remission 09/29/2014  . Opiate abuse, episodic 09/29/2014  . Mild tetrahydrocannabinol (THC) abuse 09/29/2014  . Adjustment disorder with depressed mood 09/29/2014  . Chronic pain syndrome 04/18/2014  . Primary osteoarthritis of left shoulder 04/18/2014  . Primary osteoarthritis of right knee 04/18/2014  . Primary osteoarthritis of left knee 04/18/2014  . ANXIETY 03/22/2010  . HYPERTENSION, BENIGN ESSENTIAL 03/22/2010  . ASTHMA 03/22/2010  . CONSTIPATION  03/22/2010  . HEPATITIS C 06/04/1995    Shawnya Mayor 02/10/2015, 9:26 AM  [facility] [address]  [address], [place], [place], [address] Phone: [phone]   Fax:  [phone]

## 2015-02-14 ENCOUNTER — Ambulatory Visit: Payer: Medicare HMO

## 2015-02-14 DIAGNOSIS — IMO0002 Reserved for concepts with insufficient information to code with codable children: Secondary | ICD-10-CM

## 2015-02-14 DIAGNOSIS — M25622 Stiffness of left elbow, not elsewhere classified: Secondary | ICD-10-CM

## 2015-02-14 DIAGNOSIS — M2560 Stiffness of unspecified joint, not elsewhere classified: Secondary | ICD-10-CM | POA: Diagnosis not present

## 2015-02-14 DIAGNOSIS — R531 Weakness: Secondary | ICD-10-CM

## 2015-02-14 NOTE — Patient Instructions (Signed)
Cane Exercise: Flexion   Lie on back, holding cane above chest. Keeping arms as straight as possible, lower cane toward floor beyond head. Hold ___5_ seconds.  Stop at your chest. Repeat _10___ times. Do _3___ sessions per day.  http://gt2.exer.us/92   Copyright  VHI. All rights reserved.  Cane Exercise: External Rotation   Lie with elbows on surface, even with shoulders. Hold cane above chest, palms toward toes. Lower arms back as far as possible. Keep elbows on surface. Hold __5__ seconds. Repeat __10__ times. Do __3__ sessions per day.  http://gt2.exer.us/88   Copyright  VHI. All rights reserved.  [facility] [address] [place], [place] [address] Phone # [phone] Fax [phone]

## 2015-02-14 NOTE — Therapy (Signed)
[facility] [address], [place], [place], [address] Phone: [phone]   Fax:  [phone]  Physical Therapy Treatment  Patient Details  Name: Ashley Riddle MRN: [id number] Date of Birth: Mar 11, 1946 Referring Provider:  Garald Balding, MD  Encounter Date: 02/14/2015      PT End of Session - 02/14/15 1225    Visit Number 6   Number of Visits 10   Date for PT Re-Evaluation 03/08/15   Authorization Type Medicaid   Authorization Time Period 01/16/15-03/12/15   Authorization - Visit Number 5   Authorization - Number of Visits 8   PT Start Time 9983   PT Stop Time 1226   PT Time Calculation (min) 37 min   Activity Tolerance Patient limited by pain   Behavior During Therapy [facility] for tasks assessed/performed      Past Medical History  Diagnosis Date  . Arthritis   . Hypertension   . DDD (degenerative disc disease), lumbar   . DDD (degenerative disc disease), cervical   . HTN (hypertension)   . Gout   . Anxiety   . Constipation   . Hepatitis C     treated  . Pneumonia 12/02/13  . Heart murmur     probable bicuspid AV with mild AS, mild MR by 04/22/14 Echo (Dr. Montez Morita)    Past Surgical History  Procedure Laterality Date  . Abdominal hysterectomy    . Cesarean section      x 3  . Back surgery    . Appendectomy    . Inner ear surgery      blood vessel  . Tonsillectomy    . Colonoscopy w/ polypectomy    . Total shoulder arthroplasty Left 12/27/2014    Procedure: TOTAL SHOULDER ARTHROPLASTY;  Surgeon: Garald Balding, MD;  Location: [place];  Service: Orthopedics;  Laterality: Left;    There were no vitals filed for this visit.  Visit Diagnosis:  Stiffness of extremity  Stiffness of elbow joint, left  Weakness      Subjective Assessment - 02/14/15 1151    Subjective Pt saw MD yesterday.  He requested aggresive ROM as pt is getting stiff.  Pt is able to drive and is now out of sling.   Currently in Pain? Yes   Pain Score 7    Pain Location Shoulder   Pain Orientation Left   Pain Descriptors / Indicators Aching   Pain Type Surgical pain                         [facility] Adult PT Treatment/Exercise - 02/14/15 0001    Shoulder Exercises: Seated   Other Seated Exercises shoulder shrugs and circles x 10 each   Shoulder Exercises: Pulleys   Flexion 3 minutes   Flexion Limitations 2x3 minutes. Verbal cues to reduce substitution by trunk   Shoulder Exercises: Stretch   External Rotation Stretch 5 reps;10 seconds  with cane   Other Shoulder Stretches cane AAROM to 90 degrees 10 x 10 seconds   Manual Therapy   Soft tissue mobilization --   Passive ROM PROM LT shoulder fllexion, scaption, ER  All to tolerance                PT Education - 02/14/15 1211    Education provided Yes   Education Details HEP: AAROM shoulder flexion and ER in supine   Person(s) Educated Patient   Methods Explanation;Demonstration;Handout   Comprehension  Verbalized understanding;Returned demonstration          PT Short Term Goals - 02/14/15 1227    PT SHORT TERM GOAL #1   Title independent with initial HEP   Status Achieved   PT SHORT TERM GOAL #2   Title PROM of left shoulder for flexion >/= 90 degrees   Time 4   Period Weeks   Status On-going   PT SHORT TERM GOAL #3   Title PROM of left shoulder for abduction >/= 70 degrees   Time 4   Period Weeks   Status On-going           PT Long Term Goals - 01/11/15 1527    PT LONG TERM GOAL #1   Title Indepenent with HEP and understand how to progress   Baseline not educated yet   Time 8   Period Weeks   Status New   PT LONG TERM GOAL #2   Title left shoulder AROM >/= 90 degrees so she can reach at shoulder height   Baseline 0 degrees as per MD orders   Time 8   Period Weeks   Status New   PT LONG TERM GOAL #3   Title left shoulder AROM abduction >/= 80 degrees so she can wash herself   Baseline 0  degrees as per MD orders   Time 8   Period Weeks   Status New   PT LONG TERM GOAL #4   Title ability to dress herself due to left shoulder strength >/= 3+/5   Baseline unable to dress herself   Time 8   Period Weeks   Status New   PT LONG TERM GOAL #5   Title grip strength on left >/= 30 pounds to hold items in left hand   Baseline 8 pounds   Time 8   Period Weeks   Status New               Plan - 02/14/15 1155    Clinical Impression Statement Pt continues to demonstrate limited Lt shoulder and elbow AROM and is limited significantly by pain during treatment.  Pt tolerated pulleys well today.  AAROM with cane was challenging as pt was tearful due to pain.,  Pt will continue to benefit from skilled PT for ROM and strength progression as allowed by protocol.   Pt will benefit from skilled therapeutic intervention in order to improve on the following deficits Pain;Increased muscle spasms;Decreased scar mobility;Decreased activity tolerance;Decreased endurance;Decreased range of motion;Decreased strength;Impaired UE functional use;Increased edema   Rehab Potential Excellent   PT Frequency 2x / week   PT Duration 8 weeks   PT Treatment/Interventions ADLs/Self Care Home Management;Electrical Stimulation;Ultrasound;Moist Heat;Therapeutic activities;Therapeutic exercise;Neuromuscular re-education;Manual techniques;Patient/family education;Scar mobilization;Passive range of motion   PT Next Visit Plan Continue to follow protocol, AAROM and PROM of Lt shoulder   Consulted and Agree with Plan of Care Patient        Problem List Patient Active Problem List   Diagnosis Date Noted  . Fracture of glenoid process of left scapula 12/29/2014  . Status post total shoulder arthroplasty 12/27/2014  . Cocaine dependence in remission 09/29/2014  . Severe heroin dependence in sustained remission 09/29/2014  . Opiate abuse, episodic 09/29/2014  . Mild tetrahydrocannabinol (THC) abuse  09/29/2014  . Adjustment disorder with depressed mood 09/29/2014  . Chronic pain syndrome 04/18/2014  . Primary osteoarthritis of left shoulder 04/18/2014  . Primary osteoarthritis of right knee 04/18/2014  . Primary osteoarthritis of left knee  04/18/2014  . ANXIETY 03/22/2010  . HYPERTENSION, BENIGN ESSENTIAL 03/22/2010  . ASTHMA 03/22/2010  . CONSTIPATION 03/22/2010  . HEPATITIS C 06/04/1995    Jaymarion Trombly, PT 02/14/2015, 12:28 PM  [facility] [address], [place], [place], [address] Phone: [phone]   Fax:  [phone]

## 2015-02-16 ENCOUNTER — Ambulatory Visit: Payer: Medicare HMO | Admitting: Physical Therapy

## 2015-02-16 ENCOUNTER — Encounter: Payer: Self-pay | Admitting: Physical Therapy

## 2015-02-16 DIAGNOSIS — M25622 Stiffness of left elbow, not elsewhere classified: Secondary | ICD-10-CM

## 2015-02-16 DIAGNOSIS — M2560 Stiffness of unspecified joint, not elsewhere classified: Principal | ICD-10-CM

## 2015-02-16 DIAGNOSIS — R531 Weakness: Secondary | ICD-10-CM

## 2015-02-16 DIAGNOSIS — IMO0002 Reserved for concepts with insufficient information to code with codable children: Secondary | ICD-10-CM

## 2015-02-16 DIAGNOSIS — R609 Edema, unspecified: Secondary | ICD-10-CM

## 2015-02-16 NOTE — Therapy (Signed)
[facility] [address], [place], [place], [address] Phone: [phone]   Fax:  [phone]  Physical Therapy Treatment  Patient Details  Name: Ashley Riddle MRN: [id number] Date of Birth: 04/16/1946 Referring Provider:  Garald Balding, MD  Encounter Date: 02/16/2015      PT End of Session - 02/16/15 1232    Visit Number 7   Number of Visits 10   Date for PT Re-Evaluation 03/08/15   Authorization Type Medicaid   Authorization Time Period 01/16/15-03/12/15   Authorization - Visit Number 7   Authorization - Number of Visits 8   PT Start Time 8403   PT Stop Time 1245   PT Time Calculation (min) 46 min   Activity Tolerance Patient limited by pain   Behavior During Therapy [facility] for tasks assessed/performed      Past Medical History  Diagnosis Date  . Arthritis   . Hypertension   . DDD (degenerative disc disease), lumbar   . DDD (degenerative disc disease), cervical   . HTN (hypertension)   . Gout   . Anxiety   . Constipation   . Hepatitis C     treated  . Pneumonia 12/02/13  . Heart murmur     probable bicuspid AV with mild AS, mild MR by 04/22/14 Echo (Dr. Montez Morita)    Past Surgical History  Procedure Laterality Date  . Abdominal hysterectomy    . Cesarean section      x 3  . Back surgery    . Appendectomy    . Inner ear surgery      blood vessel  . Tonsillectomy    . Colonoscopy w/ polypectomy    . Total shoulder arthroplasty Left 12/27/2014    Procedure: TOTAL SHOULDER ARTHROPLASTY;  Surgeon: Garald Balding, MD;  Location: [place];  Service: Orthopedics;  Laterality: Left;    There were no vitals filed for this visit.  Visit Diagnosis:  Stiffness of extremity  Stiffness of elbow joint, left  Weakness  Edema      Subjective Assessment - 02/16/15 1202    Subjective Pt reports increase of pain in left shoulder rated as 7/10. Pt is out of the sling and is driving    Patient Stated  Goals move left arm again   Currently in Pain? Yes   Pain Score 7    Pain Location Shoulder   Pain Orientation Left   Pain Descriptors / Indicators Aching   Pain Type Surgical pain   Pain Onset 1 to 4 weeks ago   Pain Frequency Constant   Multiple Pain Sites No                         [facility] Adult PT Treatment/Exercise - 02/16/15 0001    Shoulder Exercises: Seated   Other Seated Exercises shoulder shrugs and circles x 10 each   Shoulder Exercises: Standing   Other Standing Exercises walladder x 5 to 5 peg   Shoulder Exercises: Pulleys   Flexion 3 minutes   Flexion Limitations --  pt with major compensatory movement   Shoulder Exercises: Stretch   Other Shoulder Stretches cane AAROM to 90 degrees 10 , and ABDUCTION   Modalities   Modalities Moist Heat   Moist Heat Therapy   Number Minutes Moist Heat 15 Minutes   Moist Heat Location Shoulder  left   Manual Therapy   Soft tissue mobilization Lt pectoralis major and minor, rhomboids,  UT   Passive ROM PROM LT shoulder fllexion, scaption, ER                  PT Short Term Goals - 02/14/15 1227    PT SHORT TERM GOAL #1   Title independent with initial HEP   Status Achieved   PT SHORT TERM GOAL #2   Title PROM of left shoulder for flexion >/= 90 degrees   Time 4   Period Weeks   Status On-going   PT SHORT TERM GOAL #3   Title PROM of left shoulder for abduction >/= 70 degrees   Time 4   Period Weeks   Status On-going           PT Long Term Goals - 01/11/15 1527    PT LONG TERM GOAL #1   Title Indepenent with HEP and understand how to progress   Baseline not educated yet   Time 8   Period Weeks   Status New   PT LONG TERM GOAL #2   Title left shoulder AROM >/= 90 degrees so she can reach at shoulder height   Baseline 0 degrees as per MD orders   Time 8   Period Weeks   Status New   PT LONG TERM GOAL #3   Title left shoulder AROM abduction >/= 80 degrees so she can wash herself    Baseline 0 degrees as per MD orders   Time 8   Period Weeks   Status New   PT LONG TERM GOAL #4   Title ability to dress herself due to left shoulder strength >/= 3+/5   Baseline unable to dress herself   Time 8   Period Weeks   Status New   PT LONG TERM GOAL #5   Title grip strength on left >/= 30 pounds to hold items in left hand   Baseline 8 pounds   Time 8   Period Weeks   Status New               Plan - 02/16/15 1240    Clinical Impression Statement Palpaple tighness ant /posterior shoulder girdle very limited ROM of scapula in all planes. Pt limited by pain and was tearful today due to slow progression. Able to tolerate pulleys and cane exercise well, able to perform wallladder x 5. Pt will continue to benefit from skilled PT   Pt will benefit from skilled therapeutic intervention in order to improve on the following deficits Pain;Increased muscle spasms;Decreased scar mobility;Decreased activity tolerance;Decreased endurance;Decreased range of motion;Decreased strength;Impaired UE functional use;Increased edema   Rehab Potential Excellent   PT Frequency 2x / week   PT Duration 8 weeks   PT Treatment/Interventions ADLs/Self Care Home Management;Electrical Stimulation;Ultrasound;Moist Heat;Therapeutic activities;Therapeutic exercise;Neuromuscular re-education;Manual techniques;Patient/family education;Scar mobilization;Passive range of motion   PT Next Visit Plan Continue to follow protocol, AAROM and PROM of Lt shoulder   PT Home Exercise Plan PROM for home   Consulted and Agree with Plan of Care Patient        Problem List Patient Active Problem List   Diagnosis Date Noted  . Fracture of glenoid process of left scapula 12/29/2014  . Status post total shoulder arthroplasty 12/27/2014  . Cocaine dependence in remission 09/29/2014  . Severe heroin dependence in sustained remission 09/29/2014  . Opiate abuse, episodic 09/29/2014  . Mild tetrahydrocannabinol (THC)  abuse 09/29/2014  . Adjustment disorder with depressed mood 09/29/2014  . Chronic pain syndrome 04/18/2014  . Primary osteoarthritis of  left shoulder 04/18/2014  . Primary osteoarthritis of right knee 04/18/2014  . Primary osteoarthritis of left knee 04/18/2014  . ANXIETY 03/22/2010  . HYPERTENSION, BENIGN ESSENTIAL 03/22/2010  . ASTHMA 03/22/2010  . CONSTIPATION 03/22/2010  . HEPATITIS C 06/04/1995    NAUMANN-HOUEGNIFIO,Jalani Cullifer PTA 02/16/2015, 1:23 PM  [facility] [address] [place], [place], [address] Phone: [phone]   Fax:  [phone]

## 2015-02-21 ENCOUNTER — Ambulatory Visit: Payer: Medicare HMO

## 2015-02-21 DIAGNOSIS — M2560 Stiffness of unspecified joint, not elsewhere classified: Secondary | ICD-10-CM | POA: Diagnosis not present

## 2015-02-21 DIAGNOSIS — IMO0002 Reserved for concepts with insufficient information to code with codable children: Secondary | ICD-10-CM

## 2015-02-21 DIAGNOSIS — R531 Weakness: Secondary | ICD-10-CM

## 2015-02-21 DIAGNOSIS — M25622 Stiffness of left elbow, not elsewhere classified: Secondary | ICD-10-CM

## 2015-02-21 NOTE — Therapy (Signed)
[facility] [address] [place], [place], [address] Phone: [phone]   Fax:  [phone]  Physical Therapy Treatment  Patient Details  Name: Ashley Riddle MRN: [id number] Date of Birth: 08/05/1945 Referring Provider:  Garald Balding, MD  Encounter Date: 02/21/2015      PT End of Session - 02/21/15 1222    Visit Number 8   Number of Visits 10   Date for PT Re-Evaluation 03/08/15   Authorization Type Medicaid   Authorization Time Period 01/16/15-03/12/15   Authorization - Visit Number 7   Authorization - Number of Visits 8   PT Start Time 9935   PT Stop Time 7017   PT Time Calculation (min) 46 min   Activity Tolerance Patient tolerated treatment well   Behavior During Therapy [facility] for tasks assessed/performed      Past Medical History  Diagnosis Date  . Arthritis   . Hypertension   . DDD (degenerative disc disease), lumbar   . DDD (degenerative disc disease), cervical   . HTN (hypertension)   . Gout   . Anxiety   . Constipation   . Hepatitis C     treated  . Pneumonia 12/02/13  . Heart murmur     probable bicuspid AV with mild AS, mild MR by 04/22/14 Echo (Dr. Montez Morita)    Past Surgical History  Procedure Laterality Date  . Abdominal hysterectomy    . Cesarean section      x 3  . Back surgery    . Appendectomy    . Inner ear surgery      blood vessel  . Tonsillectomy    . Colonoscopy w/ polypectomy    . Total shoulder arthroplasty Left 12/27/2014    Procedure: TOTAL SHOULDER ARTHROPLASTY;  Surgeon: Garald Balding, MD;  Location: [place];  Service: Orthopedics;  Laterality: Left;    There were no vitals filed for this visit.  Visit Diagnosis:  Stiffness of extremity  Stiffness of elbow joint, left  Weakness      Subjective Assessment - 02/21/15 1153    Subjective Lt shoulder is really hurting and hand is swollen.     Currently in Pain? Yes   Pain Score 7    Pain Location  Shoulder   Pain Orientation Left   Pain Descriptors / Indicators Aching   Pain Type Surgical pain   Pain Onset 1 to 4 weeks ago   Pain Frequency Constant   Aggravating Factors  night time, moving arm   Pain Relieving Factors massage, gripping objects,                          [facility] Adult PT Treatment/Exercise - 02/21/15 0001    Shoulder Exercises: Standing   Other Standing Exercises wall ladder 2x 5 to 5 peg   Shoulder Exercises: Pulleys   Flexion 3 minutes   Shoulder Exercises: Stretch   Other Shoulder Stretches cane AAROM to 90 degrees 10 , and ABDUCTION   Moist Heat Therapy   Number Minutes Moist Heat 15 Minutes   Moist Heat Location Shoulder   Manual Therapy   Soft tissue mobilization Lt pectoralis major and minor, rhomboids, UT   Passive ROM PROM LT shoulder fllexion, scaption, ER                  PT Short Term Goals - 02/21/15 1157    PT SHORT TERM GOAL #2   Title  PROM of left shoulder for flexion >/= 90 degrees   Time 4   Period Weeks   Status On-going   PT SHORT TERM GOAL #3   Title PROM of left shoulder for abduction >/= 70 degrees   Time 4   Period Weeks   Status On-going   PT SHORT TERM GOAL #4   Title pain with daily activities decreased >/= 4/10   Time 4   Period Weeks   Status On-going  7/10           PT Long Term Goals - 01/11/15 1527    PT LONG TERM GOAL #1   Title Indepenent with HEP and understand how to progress   Baseline not educated yet   Time 8   Period Weeks   Status New   PT LONG TERM GOAL #2   Title left shoulder AROM >/= 90 degrees so she can reach at shoulder height   Baseline 0 degrees as per MD orders   Time 8   Period Weeks   Status New   PT LONG TERM GOAL #3   Title left shoulder AROM abduction >/= 80 degrees so she can wash herself   Baseline 0 degrees as per MD orders   Time 8   Period Weeks   Status New   PT LONG TERM GOAL #4   Title ability to dress herself due to left shoulder strength  >/= 3+/5   Baseline unable to dress herself   Time 8   Period Weeks   Status New   PT LONG TERM GOAL #5   Title grip strength on left >/= 30 pounds to hold items in left hand   Baseline 8 pounds   Time 8   Period Weeks   Status New               Plan - 02/21/15 1157    Clinical Impression Statement Pt with continued high levels of pain in the Lt shoulder and arm.  Pt with edema in the Lt hand today that limtis ability to grip.  AROM and PROM are significantly limited by pain although PROM was better tolerated today.  Pt is tearful during treatment.  Pt will continue to benefit from skilled PT to advance Lt shoulder AROM and strength as tolerated and allowed by protocol.     Pt will benefit from skilled therapeutic intervention in order to improve on the following deficits Pain;Increased muscle spasms;Decreased scar mobility;Decreased activity tolerance;Decreased endurance;Decreased range of motion;Decreased strength;Impaired UE functional use;Increased edema   Rehab Potential Good   PT Frequency 2x / week   PT Duration 8 weeks   PT Treatment/Interventions ADLs/Self Care Home Management;Electrical Stimulation;Ultrasound;Moist Heat;Therapeutic activities;Therapeutic exercise;Neuromuscular re-education;Manual techniques;Patient/family education;Scar mobilization;Passive range of motion   PT Next Visit Plan Continue to follow protocol, AAROM and PROM of Lt shoulder.  Measure Lt shoulder PROM   Consulted and Agree with Plan of Care Patient        Problem List Patient Active Problem List   Diagnosis Date Noted  . Fracture of glenoid process of left scapula 12/29/2014  . Status post total shoulder arthroplasty 12/27/2014  . Cocaine dependence in remission 09/29/2014  . Severe heroin dependence in sustained remission 09/29/2014  . Opiate abuse, episodic 09/29/2014  . Mild tetrahydrocannabinol (THC) abuse 09/29/2014  . Adjustment disorder with depressed mood 09/29/2014  . Chronic  pain syndrome 04/18/2014  . Primary osteoarthritis of left shoulder 04/18/2014  . Primary osteoarthritis of right knee 04/18/2014  .  Primary osteoarthritis of left knee 04/18/2014  . ANXIETY 03/22/2010  . HYPERTENSION, BENIGN ESSENTIAL 03/22/2010  . ASTHMA 03/22/2010  . CONSTIPATION 03/22/2010  . HEPATITIS C 06/04/1995    TAKACS,KELLY , PT  02/21/2015, 12:23 PM  [facility] [address] [place], [place], [address] Phone: [phone]   Fax:  [phone]

## 2015-02-23 ENCOUNTER — Ambulatory Visit: Payer: Medicare HMO

## 2015-02-23 DIAGNOSIS — R531 Weakness: Secondary | ICD-10-CM

## 2015-02-23 DIAGNOSIS — IMO0002 Reserved for concepts with insufficient information to code with codable children: Secondary | ICD-10-CM

## 2015-02-23 DIAGNOSIS — M2560 Stiffness of unspecified joint, not elsewhere classified: Principal | ICD-10-CM

## 2015-02-23 DIAGNOSIS — M25622 Stiffness of left elbow, not elsewhere classified: Secondary | ICD-10-CM

## 2015-02-23 NOTE — Patient Instructions (Signed)
EXTENSION: Supine - Elbow Flexed (Isometric)   Lie on back, right arm at side on surface, elbow bent. Press upper arm down into surface, keeping elbow bent. Hold 5___ seconds. Complete _1-2__ sets of __10_ repetitions. Perform _2__ sessions per day.  Copyright  VHI. All rights reserved.  Adduction (Isometric)   Press upper arm against side of trunk. Hold __5__ seconds. Relax. Repeat 1-2 sets of 10____ times. Do _2___ sessions per day. Activity: Use this motion to hold an envelope or a slim purse.*  Copyright  VHI. All rights reserved.  [facility] [address], [place], [place] [address] Phone # [phone] Fax [phone]

## 2015-02-23 NOTE — Therapy (Addendum)
[facility] [address], [place], [place], [address] Phone: [phone]   Fax:  [phone]  Physical Therapy Treatment  Patient Details  Name: Ashley Riddle MRN: [id number] Date of Birth: 1946/03/11 Referring Provider:  Wallene Huh, MD/ Dr Joni Fears  Encounter Date: 02/23/2015      PT End of Session - 02/23/15 1221    Visit Number 9   Number of Visits 10   Date for PT Re-Evaluation 03/08/15   Authorization Type Medicaid   Authorization - Visit Number 8   Authorization - Number of Visits 8   PT Start Time 0045   PT Stop Time 9977   PT Time Calculation (min) 50 min   Activity Tolerance Patient tolerated treatment well   Behavior During Therapy [facility] for tasks assessed/performed      Past Medical History  Diagnosis Date  . Arthritis   . Hypertension   . DDD (degenerative disc disease), lumbar   . DDD (degenerative disc disease), cervical   . HTN (hypertension)   . Gout   . Anxiety   . Constipation   . Hepatitis C     treated  . Pneumonia 12/02/13  . Heart murmur     probable bicuspid AV with mild AS, mild MR by 04/22/14 Echo (Dr. Montez Morita)    Past Surgical History  Procedure Laterality Date  . Abdominal hysterectomy    . Cesarean section      x 3  . Back surgery    . Appendectomy    . Inner ear surgery      blood vessel  . Tonsillectomy    . Colonoscopy w/ polypectomy    . Total shoulder arthroplasty Left 12/27/2014    Procedure: TOTAL SHOULDER ARTHROPLASTY;  Surgeon: Garald Balding, MD;  Location: [place];  Service: Orthopedics;  Laterality: Left;    There were no vitals filed for this visit.  Visit Diagnosis:  Stiffness of extremity  Stiffness of elbow joint, left  Weakness      Subjective Assessment - 02/23/15 1149    Subjective Hand is feeling better.  Not as much pain today.     Currently in Pain? Yes   Pain Score 6    Pain Location Shoulder   Pain Orientation Left    Pain Descriptors / Indicators Aching   Pain Type Surgical pain   Pain Onset 1 to 4 weeks ago   Pain Frequency Constant   Aggravating Factors  night time, moving arm   Pain Relieving Factors massage, gripping objects            [facility] PT Assessment - 02/23/15 0001    PROM   PROM Assessment Site Shoulder   Right/Left Shoulder Left   Left Shoulder Flexion 94 Degrees   Left Shoulder ABduction 76 Degrees   Left Shoulder External Rotation 15 Degrees                     [facility] Adult PT Treatment/Exercise - 02/23/15 0001    Shoulder Exercises: Standing   Other Standing Exercises wall ladder 2x 5 to 5 peg   Shoulder Exercises: Pulleys   Flexion 3 minutes   Flexion Limitations --  2x3 minutes   Shoulder Exercises: Isometric Strengthening   Extension Other (comment)  5" x 10   ADduction Other (comment)  5" x 10   Shoulder Exercises: Stretch   Other Shoulder Stretches cane AAROM to 90 degrees 10    Moist Heat  Therapy   Number Minutes Moist Heat 15 Minutes   Moist Heat Location Shoulder   Manual Therapy   Soft tissue mobilization Lt pectoralis major and minor, rhomboids, UT   Passive ROM PROM LT shoulder fllexion, scaption, ER                PT Education - 02/23/15 1206    Education provided Yes   Education Details isometric extension and adduction   Person(s) Educated Patient   Methods Explanation;Demonstration;Handout   Comprehension Verbalized understanding;Returned demonstration          PT Short Term Goals - 02/21/15 1157    PT SHORT TERM GOAL #2   Title PROM of left shoulder for flexion >/= 90 degrees   Time 4   Period Weeks   Status On-going   PT SHORT TERM GOAL #3   Title PROM of left shoulder for abduction >/= 70 degrees   Time 4   Period Weeks   Status On-going   PT SHORT TERM GOAL #4   Title pain with daily activities decreased >/= 4/10   Time 4   Period Weeks   Status On-going  7/10           PT Long Term Goals -  01/11/15 1527    PT LONG TERM GOAL #1   Title Indepenent with HEP and understand how to progress   Baseline not educated yet   Time 8   Period Weeks   Status New   PT LONG TERM GOAL #2   Title left shoulder AROM >/= 90 degrees so she can reach at shoulder height   Baseline 0 degrees as per MD orders   Time 8   Period Weeks   Status New   PT LONG TERM GOAL #3   Title left shoulder AROM abduction >/= 80 degrees so she can wash herself   Baseline 0 degrees as per MD orders   Time 8   Period Weeks   Status New   PT LONG TERM GOAL #4   Title ability to dress herself due to left shoulder strength >/= 3+/5   Baseline unable to dress herself   Time 8   Period Weeks   Status New   PT LONG TERM GOAL #5   Title grip strength on left >/= 30 pounds to hold items in left hand   Baseline 8 pounds   Time 8   Period Weeks   Status New               Plan - 02/23/15 1151    Clinical Impression Statement Pt with continued Lt shoulder pain that limits tolerance for therapy.  Pt with less edema in the Lt hand today.  AROM and PROM of Lt shoulder are significantly limited by pain although PROM was better tolerated in treatment this week.  PROM have improved. Pt will continue to benefit from skilled PT to advance LT shoulder AROM and strength as tolerated by protocol.     Pt will benefit from skilled therapeutic intervention in order to improve on the following deficits Pain;Increased muscle spasms;Decreased scar mobility;Decreased activity tolerance;Decreased endurance;Decreased range of motion;Decreased strength;Impaired UE functional use;Increased edema   Rehab Potential Good   PT Frequency 2x / week   PT Duration 8 weeks   PT Treatment/Interventions ADLs/Self Care Home Management;Electrical Stimulation;Ultrasound;Moist Heat;Therapeutic activities;Therapeutic exercise;Neuromuscular re-education;Manual techniques;Patient/family education;Scar mobilization;Passive range of motion   PT Next  Visit Plan Continue to follow protocol, AAROM and PROM of Lt shoulder.  Review isometrics issued today.      G-codes:  Other PT category Goal Status: Ck D/C: CL  Problem List Patient Active Problem List   Diagnosis Date Noted  . Fracture of glenoid process of left scapula 12/29/2014  . Status post total shoulder arthroplasty 12/27/2014  . Cocaine dependence in remission 09/29/2014  . Severe heroin dependence in sustained remission 09/29/2014  . Opiate abuse, episodic 09/29/2014  . Mild tetrahydrocannabinol (THC) abuse 09/29/2014  . Adjustment disorder with depressed mood 09/29/2014  . Chronic pain syndrome 04/18/2014  . Primary osteoarthritis of left shoulder 04/18/2014  . Primary osteoarthritis of right knee 04/18/2014  . Primary osteoarthritis of left knee 04/18/2014  . ANXIETY 03/22/2010  . HYPERTENSION, BENIGN ESSENTIAL 03/22/2010  . ASTHMA 03/22/2010  . CONSTIPATION 03/22/2010  . HEPATITIS C 06/04/1995    TAKACS,KELLY, PT 02/23/2015, 12:22 PM PHYSICAL THERAPY DISCHARGE SUMMARY  Visits from Start of Care: 9  Current functional level related to goals / functional outcomes: Pt attended 9 PT visit and requested D/C due to financial concerns.  Pt has limited AROM/PROM due to significant pain with motion.  Pt has HEP in place for ROM and gentle strength as allowed by protocol.     Remaining deficits:Significant Lt shoulder AROM deficits due to pain and limited use of Lt UE.    Education / Equipment: HEP Plan: Patient agrees to discharge.  Patient goals were not met. Patient is being discharged due to financial reasons.  ?????   Sigurd Sos, [place] 02/28/2015 12:24 PM  [facility] [address] [place], [place], [address] Phone: [phone]   Fax:  [phone]

## 2015-02-28 ENCOUNTER — Ambulatory Visit: Payer: Medicare HMO

## 2015-03-02 ENCOUNTER — Encounter: Payer: Self-pay | Admitting: Physical Therapy

## 2015-03-07 ENCOUNTER — Ambulatory Visit: Payer: Medicare HMO | Admitting: Physical Therapy

## 2015-03-24 ENCOUNTER — Ambulatory Visit: Payer: Medicare HMO | Attending: Orthopaedic Surgery | Admitting: Physical Therapy

## 2015-03-24 DIAGNOSIS — M25622 Stiffness of left elbow, not elsewhere classified: Secondary | ICD-10-CM | POA: Insufficient documentation

## 2015-03-24 DIAGNOSIS — M2560 Stiffness of unspecified joint, not elsewhere classified: Secondary | ICD-10-CM | POA: Insufficient documentation

## 2015-03-24 DIAGNOSIS — R600 Localized edema: Secondary | ICD-10-CM | POA: Insufficient documentation

## 2015-03-24 DIAGNOSIS — R531 Weakness: Secondary | ICD-10-CM | POA: Insufficient documentation

## 2015-03-28 ENCOUNTER — Ambulatory Visit: Payer: Medicare HMO | Admitting: Physical Therapy

## 2015-03-28 DIAGNOSIS — M2560 Stiffness of unspecified joint, not elsewhere classified: Principal | ICD-10-CM

## 2015-03-28 DIAGNOSIS — M25622 Stiffness of left elbow, not elsewhere classified: Secondary | ICD-10-CM

## 2015-03-28 DIAGNOSIS — R531 Weakness: Secondary | ICD-10-CM

## 2015-03-28 DIAGNOSIS — R600 Localized edema: Secondary | ICD-10-CM

## 2015-03-28 DIAGNOSIS — IMO0002 Reserved for concepts with insufficient information to code with codable children: Secondary | ICD-10-CM

## 2015-03-28 NOTE — Therapy (Signed)
[place], [place], [address] Phone: [phone]   Fax:  [phone]  Physical Therapy Treatment / Re-evaluation  Patient Details  Name: Ashley Riddle MRN: [id number] Date of Birth: [birth year] Referring Provider: dr. Collier Salina whitfield  Encounter Date: 03/28/2015      PT End of Session - 03/28/15 1451    Visit Number 10   Number of Visits 26   Date for PT Re-Evaluation 05/23/15   Authorization Type Medicare/Medicaid, Kx modifier by 15th visit, progress note by 18th visit   PT Start Time 1415   PT Stop Time 1500   PT Time Calculation (min) 45 min   Activity Tolerance Patient tolerated treatment well   Behavior During Therapy [facility] for tasks assessed/performed      Past Medical History  Diagnosis Date  . Arthritis   . Hypertension   . DDD (degenerative disc disease), lumbar   . DDD (degenerative disc disease), cervical   . HTN (hypertension)   . Gout   . Anxiety   . Constipation   . Hepatitis C     treated  . Pneumonia 12/02/13  . Heart murmur     probable bicuspid AV with mild AS, mild MR by 04/22/14 Echo (Dr. Montez Morita)    Past Surgical History  Procedure Laterality Date  . Abdominal hysterectomy    . Cesarean section      x 3  . Back surgery    . Appendectomy    . Inner ear surgery      blood vessel  . Tonsillectomy    . Colonoscopy w/ polypectomy    . Total shoulder arthroplasty Left 12/27/2014    Procedure: TOTAL SHOULDER ARTHROPLASTY;  Surgeon: Garald Balding, MD;  Location: [place];  Service: Orthopedics;  Laterality: Left;    There were no vitals filed for this visit.  Visit Diagnosis:  Stiffness of extremity - Plan: PT plan of care cert/re-cert  Stiffness of elbow joint, left - Plan: PT plan of care cert/re-cert  Weakness - Plan: PT plan of care cert/re-cert  Localized edema - Plan: PT plan of care cert/re-cert      Subjective Assessment - 03/28/15 1413    Subjective  pt is 69 y.o F s/p L total shouler arthroplasty on 12/27/2014. Since surgery reports that the shoulder feels like it isn't real. She states she still haves some swelling in the hand with an occasional ache.    Limitations Lifting   How long can you sit comfortably? 10-15 min   How long can you stand comfortably? unlimited   How long can you walk comfortably? unlimited   Diagnostic tests 2 weeks ago per pt report everything is looking good   Patient Stated Goals to be able to put arm all the way up and do her heair.    Currently in Pain? Yes   Pain Score 0-No pain   Pain Location Shoulder   Pain Orientation Left   Pain Descriptors / Indicators --  numbness feeling, feels fake   Pain Type Surgical pain   Pain Onset More than a month ago   Pain Frequency Intermittent   Aggravating Factors  moving the arm   Pain Relieving Factors heating            [facility] PT Assessment - 03/28/15 1418    Assessment   Medical Diagnosis S/P left total shoulder replacement   Referring Provider dr. Collier Salina whitfield   Onset Date/Surgical Date 12/27/14   Hand  Dominance Right   Next MD Visit make one PRN   Prior Therapy yes   Restrictions   Other Position/Activity Restrictions no lifting above the head   Balance Screen   Has the patient fallen in the past 6 months No   Has the patient had a decrease in activity level because of a fear of falling?  No   Is the patient reluctant to leave their home because of a fear of falling?  No   Home Environment   Living Environment Private residence   Living Arrangements Alone   Available Help at Discharge Family   Type of [place] Access Level entry   [place] One level   Prior Function   Level of Independence Independent;Independent with basic ADLs   Cognition   Overall Cognitive Status Within Functional Limits for tasks assessed   Observation/Other Assessments   Focus on Therapeutic Outcomes (FOTO)  78% limited  predicted 43%     Posture/Postural Control   Posture/Postural Control Postural limitations   Postural Limitations Rounded Shoulders;Forward head   ROM / Strength   AROM / PROM / Strength AROM   AROM   AROM Assessment Site Shoulder   Right/Left Shoulder Right;Left   Right Shoulder Extension 70 Degrees   Right Shoulder Flexion 138 Degrees   Right Shoulder ABduction 110 Degrees   Right Shoulder Internal Rotation 45 Degrees   Right Shoulder External Rotation 62 Degrees   Left Shoulder Extension 43 Degrees   Left Shoulder Flexion 58 Degrees  significant shoulder hiking   Left Shoulder ABduction 50 Degrees  with significant shoulder hiking   Left Shoulder Internal Rotation 25 Degrees   Left Shoulder External Rotation 10 Degrees   PROM   PROM Assessment Site Shoulder;Wrist   Right/Left Shoulder Right;Left   Left Shoulder Flexion 96 Degrees   Left Shoulder ABduction 80 Degrees   Left Shoulder Internal Rotation 30 Degrees   Left Shoulder External Rotation 20 Degrees   Strength   Right/Left Shoulder Right;Left   Right Shoulder Flexion 4-/5   Right Shoulder Extension 5/5   Right Shoulder ABduction 4-/5   Right Shoulder Internal Rotation 4-/5   Right Shoulder External Rotation 4/5   Left Shoulder Flexion 2/5   Left Shoulder Extension 2/5   Left Shoulder ABduction 2/5   Left Shoulder Internal Rotation 2/5   Left Shoulder External Rotation 2/5   Right Hand Gross Grasp --   Right Hand Grip (lbs) 44.3  48,44,41   Left Hand Grip (lbs) 16#  16, 15, 17   Palpation   Palpation comment tenderness located in the anterior aspect of the L shoulder and in to the lateral humerus.                      [facility] Adult PT Treatment/Exercise - 03/28/15 1418    Shoulder Exercises: Pulleys   Flexion 2 minutes   ABduction 2 minutes   Manual Therapy   Manual Therapy Joint mobilization   Joint Mobilization grade 2 inferior/posterior mobs                PT Education - 03/28/15 1451    Education  provided Yes   Education Details updated POC, goals, and HEP   Person(s) Educated Patient   Methods Explanation   Comprehension Verbalized understanding          PT Short Term Goals - 03/28/15 1455    PT SHORT TERM GOAL #1   Title independent  with initial HEP   Time 4   Period Weeks   Status Achieved   PT SHORT TERM GOAL #2   Title PROM of left shoulder for flexion >/= 90 degrees   Time 4   Period Weeks   Status Achieved   PT SHORT TERM GOAL #3   Title PROM of left shoulder for abduction >/= 70 degrees   Time 4   Period Weeks   Status Achieved   PT SHORT TERM GOAL #4   Title pain with daily activities decreased >/= 4/10   Time 4   Period Weeks   Status On-going           PT Long Term Goals - 2015/04/02 1456    PT LONG TERM GOAL #1   Title Indepenent with HEP and understand how to progress   Baseline not educated yet   Time 8   Period Weeks   Status On-going   PT LONG TERM GOAL #2   Title left shoulder AROM >/= 90 degrees so she can reach at shoulder height   Baseline 0 degrees as per MD orders   Time 8   Period Weeks   Status On-going   PT LONG TERM GOAL #3   Title left shoulder AROM abduction >/= 90 degrees so she can wash herself   Baseline 80 degrees today   Time 8   Period Weeks   Status On-going   PT LONG TERM GOAL #4   Title ability to dress herself due to left shoulder strength >/= 3+/5   Baseline unable to dress herself   Time 8   Period Weeks   Status On-going   PT LONG TERM GOAL #5   Title grip strength on left >/= 30 pounds to hold items in left hand   Baseline 16 pounds   Time 8   Period Weeks   Status On-going               Plan - 2015/04/02 1452    Clinical Impression Statement Ivionna reports to OPPT for re-evaluation being transferred from [facility]. She continues to demonstrate limited AROM/PROM of the L shoulder due to tightness and intermittent pain. Strength is limited due to pain and stiffness of the L shoulder.  Palpation revealed tenderness in the L shoulder  around the middle deltoid and into the lateral humers. She would benefit from continued therapy work toward her goals.    Pt will benefit from skilled therapeutic intervention in order to improve on the following deficits Pain;Increased muscle spasms;Decreased scar mobility;Decreased activity tolerance;Decreased endurance;Decreased range of motion;Decreased strength;Impaired UE functional use;Increased edema   Rehab Potential Good   PT Frequency 2x / week   PT Duration 8 weeks   PT Treatment/Interventions ADLs/Self Care Home Management;Electrical Stimulation;Ultrasound;Moist Heat;Therapeutic activities;Therapeutic exercise;Neuromuscular re-education;Manual techniques;Patient/family education;Scar mobilization;Passive range of motion;Iontophoresis 4mg /ml Dexamethasone;Taping   PT Next Visit Plan assess response to HEP, Continue to follow protocol, AAROM and PROM of Lt shoulder   PT Home Exercise Plan see HEP handout   Consulted and Agree with Plan of Care Patient          G-Codes - April 02, 2015 1457    Functional Assessment Tool Used Clinical judgement/ FOTO 78% limited   Functional Limitation Carrying, moving and handling objects   Carrying, Moving and Handling Objects Current Status (F3832) At least 60 percent but less than 80 percent impaired, limited or restricted   Carrying, Moving and Handling Objects Goal Status (N1916) At least 40 percent but less  than 60 percent impaired, limited or restricted      Problem List Patient Active Problem List   Diagnosis Date Noted  . Fracture of glenoid process of left scapula 12/29/2014  . Status post total shoulder arthroplasty 12/27/2014  . Cocaine dependence in remission ([place]) 09/29/2014  . Severe heroin dependence in sustained remission ([place]) 09/29/2014  . Opiate abuse, episodic 09/29/2014  . Mild tetrahydrocannabinol (THC) abuse 09/29/2014  . Adjustment disorder with depressed mood 09/29/2014  .  Chronic pain syndrome 04/18/2014  . Primary osteoarthritis of left shoulder 04/18/2014  . Primary osteoarthritis of right knee 04/18/2014  . Primary osteoarthritis of left knee 04/18/2014  . ANXIETY 03/22/2010  . HYPERTENSION, BENIGN ESSENTIAL 03/22/2010  . ASTHMA 03/22/2010  . CONSTIPATION 03/22/2010  . HEPATITIS C 06/04/1995   Starr Lake PT, DPT, LAT, ATC  03/28/2015  3:01 PM    [facility] [address] [place], [place], [address] Phone: [phone]   Fax:  [phone]  Name: Ashley Riddle MRN: [id number] Date of Birth: 04-05-1946

## 2015-03-28 NOTE — Patient Instructions (Signed)
   Kristoffer Leamon PT, DPT, LAT, ATC  [facility] Phone: [phone]     

## 2015-03-30 ENCOUNTER — Ambulatory Visit: Payer: Medicare HMO | Admitting: Physical Therapy

## 2015-03-30 ENCOUNTER — Ambulatory Visit (HOSPITAL_COMMUNITY): Payer: Self-pay | Admitting: Psychiatry

## 2015-03-30 DIAGNOSIS — R531 Weakness: Secondary | ICD-10-CM

## 2015-03-30 DIAGNOSIS — R600 Localized edema: Secondary | ICD-10-CM

## 2015-03-30 DIAGNOSIS — IMO0002 Reserved for concepts with insufficient information to code with codable children: Secondary | ICD-10-CM

## 2015-03-30 DIAGNOSIS — M25622 Stiffness of left elbow, not elsewhere classified: Secondary | ICD-10-CM

## 2015-03-30 DIAGNOSIS — M2560 Stiffness of unspecified joint, not elsewhere classified: Secondary | ICD-10-CM | POA: Diagnosis not present

## 2015-03-30 NOTE — Therapy (Signed)
[place], [place], [address] Phone: [phone]   Fax:  [phone]  Physical Therapy Treatment  Patient Details  Name: Ashley Riddle MRN: [id number] Date of Birth: Feb 01, 1946 Referring Provider: dr. Collier Salina whitfield  Encounter Date: 03/30/2015      PT End of Session - 03/30/15 1224    Visit Number 11   Number of Visits 26   Date for PT Re-Evaluation 05/23/15   Authorization Type Medicare/Medicaid, Kx modifier by 15th visit, progress note by 18th visit   PT Start Time 1200  pt arrived 15 minutes late today   PT Stop Time 1238   PT Time Calculation (min) 38 min   Activity Tolerance Patient tolerated treatment well   Behavior During Therapy [facility] for tasks assessed/performed      Past Medical History  Diagnosis Date  . Arthritis   . Hypertension   . DDD (degenerative disc disease), lumbar   . DDD (degenerative disc disease), cervical   . HTN (hypertension)   . Gout   . Anxiety   . Constipation   . Hepatitis C     treated  . Pneumonia 12/02/13  . Heart murmur     probable bicuspid AV with mild AS, mild MR by 04/22/14 Echo (Dr. Montez Morita)    Past Surgical History  Procedure Laterality Date  . Abdominal hysterectomy    . Cesarean section      x 3  . Back surgery    . Appendectomy    . Inner ear surgery      blood vessel  . Tonsillectomy    . Colonoscopy w/ polypectomy    . Total shoulder arthroplasty Left 12/27/2014    Procedure: TOTAL SHOULDER ARTHROPLASTY;  Surgeon: Garald Balding, MD;  Location: [facility];  Service: Orthopedics;  Laterality: Left;    There were no vitals filed for this visit.  Visit Diagnosis:  Stiffness of extremity  Stiffness of elbow joint, left  Weakness  Localized edema      Subjective Assessment - 03/30/15 1202    Subjective "my shoulder is hurting more today for some reason"    Currently in Pain? Yes   Pain Score 4    Pain Location Shoulder   Pain  Orientation Left   Pain Descriptors / Indicators Aching   Pain Type Surgical pain   Pain Onset More than a month ago   Pain Frequency Intermittent                         [facility] Adult PT Treatment/Exercise - 03/30/15 1203    Shoulder Exercises: Supine   Other Supine Exercises scapular retraction  2 x 10   Shoulder Exercises: Pulleys   Flexion 2 minutes   ABduction 2 minutes   Shoulder Exercises: ROM/Strengthening   UBE (Upper Arm Bike) L1 x 4 min  alternating direction every 2 min   Modalities   Modalities Electrical Stimulation   Moist Heat Therapy   Number Minutes Moist Heat 10 Minutes   Moist Heat Location Shoulder  in supine   Electrical Stimulation   Electrical Stimulation Location Lt shoulder   Electrical Stimulation Action IFC   Electrical Stimulation Parameters 10 min, L 8, 00% scan   Manual Therapy   Joint Mobilization grade 2 inferior/posterior mobs   Soft tissue mobilization Lt pectoralis major and minor, rhomboids, UT   Passive ROM PROM LT shoulder fllexion, scaption, ER  PT Education - 03/30/15 1224    Education provided Yes   Education Details E-stim education   Person(s) Educated Patient   Methods Explanation   Comprehension Verbalized understanding          PT Short Term Goals - 03/28/15 1455    PT SHORT TERM GOAL #1   Title independent with initial HEP   Time 4   Period Weeks   Status Achieved   PT SHORT TERM GOAL #2   Title PROM of left shoulder for flexion >/= 90 degrees   Time 4   Period Weeks   Status Achieved   PT SHORT TERM GOAL #3   Title PROM of left shoulder for abduction >/= 70 degrees   Time 4   Period Weeks   Status Achieved   PT SHORT TERM GOAL #4   Title pain with daily activities decreased >/= 4/10   Time 4   Period Weeks   Status On-going           PT Long Term Goals - 03/28/15 1456    PT LONG TERM GOAL #1   Title Indepenent with HEP and understand how to progress    Baseline not educated yet   Time 8   Period Weeks   Status On-going   PT LONG TERM GOAL #2   Title left shoulder AROM >/= 90 degrees so she can reach at shoulder height   Baseline 0 degrees as per MD orders   Time 8   Period Weeks   Status On-going   PT LONG TERM GOAL #3   Title left shoulder AROM abduction >/= 90 degrees so she can wash herself   Baseline 80 degrees today   Time 8   Period Weeks   Status On-going   PT LONG TERM GOAL #4   Title ability to dress herself due to left shoulder strength >/= 3+/5   Baseline unable to dress herself   Time 8   Period Weeks   Status On-going   PT LONG TERM GOAL #5   Title grip strength on left >/= 30 pounds to hold items in left hand   Baseline 16 pounds   Time 8   Period Weeks   Status On-going               Plan - 03/30/15 1225    Clinical Impression Statement Ashley Riddle arrived 15 minutes late to therapy today. She reportes having some increased soreness in the shoulder today compared to the previous session. due to time limitations focused todays session on increased PROM and joint mobs. pt opted for heat and e-stim following todays session to help reduce pain.    PT Next Visit Plan Continue to follow protocol, AAROM and PROM of Lt shoulder   Consulted and Agree with Plan of Care Patient        Problem List Patient Active Problem List   Diagnosis Date Noted  . Fracture of glenoid process of left scapula 12/29/2014  . Status post total shoulder arthroplasty 12/27/2014  . Cocaine dependence in remission ([place]) 09/29/2014  . Severe heroin dependence in sustained remission ([place]) 09/29/2014  . Opiate abuse, episodic 09/29/2014  . Mild tetrahydrocannabinol (THC) abuse 09/29/2014  . Adjustment disorder with depressed mood 09/29/2014  . Chronic pain syndrome 04/18/2014  . Primary osteoarthritis of left shoulder 04/18/2014  . Primary osteoarthritis of right knee 04/18/2014  . Primary osteoarthritis of left knee  04/18/2014  . ANXIETY 03/22/2010  . HYPERTENSION, BENIGN ESSENTIAL  03/22/2010  . ASTHMA 03/22/2010  . CONSTIPATION 03/22/2010  . HEPATITIS C 06/04/1995   Starr Lake PT, DPT, LAT, ATC  03/30/2015  12:45 PM     [facility] [address] [place], [place], [address] Phone: [phone]   Fax:  [phone]  Name: Ashley Riddle MRN: [id number] Date of Birth: 14-Aug-1945

## 2015-04-10 ENCOUNTER — Ambulatory Visit: Payer: Medicare HMO | Attending: Orthopaedic Surgery | Admitting: Physical Therapy

## 2015-04-10 DIAGNOSIS — R600 Localized edema: Secondary | ICD-10-CM | POA: Diagnosis present

## 2015-04-10 DIAGNOSIS — M2560 Stiffness of unspecified joint, not elsewhere classified: Secondary | ICD-10-CM | POA: Insufficient documentation

## 2015-04-10 DIAGNOSIS — IMO0002 Reserved for concepts with insufficient information to code with codable children: Secondary | ICD-10-CM

## 2015-04-10 DIAGNOSIS — R531 Weakness: Secondary | ICD-10-CM | POA: Diagnosis present

## 2015-04-10 DIAGNOSIS — M25622 Stiffness of left elbow, not elsewhere classified: Secondary | ICD-10-CM | POA: Diagnosis present

## 2015-04-10 NOTE — Therapy (Signed)
[place], [place], [address] Phone: [phone]   Fax:  [phone]  Physical Therapy Treatment  Patient Details  Name: Ashley Riddle MRN: [id number] Date of Birth: 12/16/1945 Referring Provider: dr. Collier Salina whitfield  Encounter Date: 04/10/2015      PT End of Session - 04/10/15 1548    Visit Number 12   Number of Visits 26   Date for PT Re-Evaluation 05/23/15   Authorization Type Medicare/Medicaid, Kx modifier by 15th visit, progress note by 18th visit   PT Start Time 1500   PT Stop Time 1555   PT Time Calculation (min) 55 min   Activity Tolerance Patient tolerated treatment well   Behavior During Therapy [facility] for tasks assessed/performed      Past Medical History  Diagnosis Date  . Arthritis   . Hypertension   . DDD (degenerative disc disease), lumbar   . DDD (degenerative disc disease), cervical   . HTN (hypertension)   . Gout   . Anxiety   . Constipation   . Hepatitis C     treated  . Pneumonia 12/02/13  . Heart murmur     probable bicuspid AV with mild AS, mild MR by 04/22/14 Echo (Dr. Montez Morita)    Past Surgical History  Procedure Laterality Date  . Abdominal hysterectomy    . Cesarean section      x 3  . Back surgery    . Appendectomy    . Inner ear surgery      blood vessel  . Tonsillectomy    . Colonoscopy w/ polypectomy    . Total shoulder arthroplasty Left 12/27/2014    Procedure: TOTAL SHOULDER ARTHROPLASTY;  Surgeon: Garald Balding, MD;  Location: [facility];  Service: Orthopedics;  Laterality: Left;    There were no vitals filed for this visit.  Visit Diagnosis:  Stiffness of extremity  Weakness  Localized edema      Subjective Assessment - 04/10/15 1510    Subjective "My shoulder is doing better and I can tell a difference"   Currently in Pain? No/denies   Pain Location Shoulder   Pain Orientation Left   Pain Onset More than a month ago   Pain Frequency  Intermittent                         [facility] Adult PT Treatment/Exercise - 04/10/15 1511    Shoulder Exercises: Supine   Protraction PROM;AAROM;Strengthening;Both;15 reps  with wand   Shoulder Exercises: Pulleys   Flexion 2 minutes  93 degrees   Shoulder Exercises: ROM/Strengthening   UBE (Upper Arm Bike) L x 1 x 6 min  alternating direction every 3 min   Moist Heat Therapy   Number Minutes Moist Heat 10 Minutes   Moist Heat Location Shoulder  in supine   Electrical Stimulation   Electrical Stimulation Location Lt shoulder   Electrical Stimulation Action IFC   Electrical Stimulation Parameters 10 min, 100% scan, L 5   Electrical Stimulation Goals Pain   Manual Therapy   Joint Mobilization grade 2 inferior/posterior mobs, grade 3 scapular mobs in all directions   Soft tissue mobilization Lt pectoralis major and minor, rhomboids, UT   Passive ROM PROM LT shoulder fllexion, scaption, ER                PT Education - 04/10/15 1548    Education provided Yes   Education Details HEP review   Person(s)  Educated Patient   Methods Explanation   Comprehension Verbalized understanding          PT Short Term Goals - 04/10/15 1551    PT SHORT TERM GOAL #1   Title independent with initial HEP   Time 4   Period Weeks   Status Achieved   PT SHORT TERM GOAL #2   Title PROM of left shoulder for flexion >/= 90 degrees   Time 4   Period Weeks   Status Achieved   PT SHORT TERM GOAL #3   Title PROM of left shoulder for abduction >/= 70 degrees   Time 4   Period Weeks   Status Achieved   PT SHORT TERM GOAL #4   Title pain with daily activities decreased >/= 4/10   Time 4   Period Weeks   Status On-going           PT Long Term Goals - 04/10/15 1551    PT LONG TERM GOAL #1   Title Indepenent with HEP and understand how to progress   Baseline not educated yet   Time 8   Period Weeks   Status On-going   PT LONG TERM GOAL #2   Title left shoulder  AROM >/= 90 degrees so she can reach at shoulder height   Baseline 0 degrees as per MD orders   Time 8   Period Weeks   Status On-going   PT LONG TERM GOAL #3   Title left shoulder AROM abduction >/= 90 degrees so she can wash herself   Baseline 80 degrees today   Time 8   Period Weeks   Status On-going   PT LONG TERM GOAL #4   Title ability to dress herself due to left shoulder strength >/= 3+/5   Baseline unable to dress herself   Time 8   Period Weeks   Status On-going   PT LONG TERM GOAL #5   Title grip strength on left >/= 30 pounds to hold items in left hand   Baseline 16 pounds   Time 8   Period Weeks   Status On-going               Plan - 04/10/15 1549    Clinical Impression Statement Ms schlueter reports that she is doing better today and hasnt' had much pain. she continues to demonstrate soreness in the shoulder at end of her available range of motion. Focused todays therapy on increasing her ROM with PROM and AAROM exercises for flexion abduction. Follwoing scapular mobs she reported relief of pain. pt reported decreased pain inthe shoulder following heat and e-stim.    PT Next Visit Plan Continue to follow protocol, AAROM and PROM of Lt shoulder   PT Home Exercise Plan HEP review   Consulted and Agree with Plan of Care Patient        Problem List Patient Active Problem List   Diagnosis Date Noted  . Fracture of glenoid process of left scapula 12/29/2014  . Status post total shoulder arthroplasty 12/27/2014  . Cocaine dependence in remission ([place]) 09/29/2014  . Severe heroin dependence in sustained remission ([place]) 09/29/2014  . Opiate abuse, episodic 09/29/2014  . Mild tetrahydrocannabinol (THC) abuse 09/29/2014  . Adjustment disorder with depressed mood 09/29/2014  . Chronic pain syndrome 04/18/2014  . Primary osteoarthritis of left shoulder 04/18/2014  . Primary osteoarthritis of right knee 04/18/2014  . Primary osteoarthritis of left knee  04/18/2014  . ANXIETY 03/22/2010  . HYPERTENSION, BENIGN  ESSENTIAL 03/22/2010  . ASTHMA 03/22/2010  . CONSTIPATION 03/22/2010  . HEPATITIS C 06/04/1995   Starr Lake PT, DPT, LAT, ATC  04/10/2015  3:55 PM    [facility] [address] [place], [place], [address] Phone: [phone]   Fax:  [phone]  Name: Ashley Riddle MRN: [id number] Date of Birth: 1946-04-07

## 2015-04-12 ENCOUNTER — Ambulatory Visit: Payer: Medicare HMO | Admitting: Physical Therapy

## 2015-04-18 ENCOUNTER — Ambulatory Visit: Payer: Medicare HMO | Admitting: Physical Therapy

## 2015-04-18 DIAGNOSIS — M25622 Stiffness of left elbow, not elsewhere classified: Secondary | ICD-10-CM

## 2015-04-18 DIAGNOSIS — R531 Weakness: Secondary | ICD-10-CM

## 2015-04-18 DIAGNOSIS — M2560 Stiffness of unspecified joint, not elsewhere classified: Principal | ICD-10-CM

## 2015-04-18 DIAGNOSIS — R600 Localized edema: Secondary | ICD-10-CM

## 2015-04-18 DIAGNOSIS — IMO0002 Reserved for concepts with insufficient information to code with codable children: Secondary | ICD-10-CM

## 2015-04-18 NOTE — Therapy (Signed)
[place], [place], [address] Phone: [phone]   Fax:  [phone]  Physical Therapy Treatment  Patient Details  Name: Ashley Riddle MRN: [id number] Date of Birth: 07-25-1945 Referring Provider: dr. Collier Salina whitfield  Encounter Date: 04/18/2015    Past Medical History  Diagnosis Date  . Arthritis   . Hypertension   . DDD (degenerative disc disease), lumbar   . DDD (degenerative disc disease), cervical   . HTN (hypertension)   . Gout   . Anxiety   . Constipation   . Hepatitis C     treated  . Pneumonia 12/02/13  . Heart murmur     probable bicuspid AV with mild AS, mild MR by 04/22/14 Echo (Dr. Montez Morita)    Past Surgical History  Procedure Laterality Date  . Abdominal hysterectomy    . Cesarean section      x 3  . Back surgery    . Appendectomy    . Inner ear surgery      blood vessel  . Tonsillectomy    . Colonoscopy w/ polypectomy    . Total shoulder arthroplasty Left 12/27/2014    Procedure: TOTAL SHOULDER ARTHROPLASTY;  Surgeon: Garald Balding, MD;  Location: [place];  Service: Orthopedics;  Laterality: Left;    There were no vitals filed for this visit.  Visit Diagnosis:  Stiffness of extremity  Weakness  Localized edema  Stiffness of elbow joint, left      Subjective Assessment - 04/18/15 1504    Subjective "things are going alright and I can tell that I am still getting better"    Currently in Pain? Yes   Pain Score 4    Pain Location Shoulder   Pain Orientation Left   Pain Descriptors / Indicators Aching   Pain Type Surgical pain   Pain Onset More than a month ago   Pain Frequency Intermittent   Aggravating Factors  cold weather   Pain Relieving Factors moving the arm around,                          [facility] Adult PT Treatment/Exercise - 04/18/15 0001    Shoulder Exercises: Supine   Protraction PROM;AAROM;Strengthening;Both;15 reps   Other  Supine Exercises scapular retraction  2 x 10   Other Supine Exercises circles CW/CCW 2 x 10, protraction 2 x 10  with 2#   Shoulder Exercises: Standing   Extension AROM;Strengthening;Both;15 reps;Theraband   Theraband Level (Shoulder Extension) Level 2 (Red)   Row AROM;Strengthening;Both;15 reps;Theraband   Theraband Level (Shoulder Row) Level 2 (Red)   Shoulder Exercises: ROM/Strengthening   UBE (Upper Arm Bike) L1 x 8 min  changing direction very 4 min   Moist Heat Therapy   Number Minutes Moist Heat 15 Minutes   Moist Heat Location Shoulder  supine   Electrical Stimulation   Electrical Stimulation Location Lt shoulder   Electrical Stimulation Action IFC   Electrical Stimulation Parameters 15 min, 100% scan L7   Electrical Stimulation Goals Pain   Manual Therapy   Joint Mobilization grade 2 inferior/posterior mobs, grade 3 scapular mobs in all directions   Soft tissue mobilization Lt pectoralis major and minor, rhomboids, UT   Passive ROM PROM LT shoulder fllexion, scaption, ER                  PT Short Term Goals - 04/10/15 1551    PT SHORT TERM GOAL #1  Title independent with initial HEP   Time 4   Period Weeks   Status Achieved   PT SHORT TERM GOAL #2   Title PROM of left shoulder for flexion >/= 90 degrees   Time 4   Period Weeks   Status Achieved   PT SHORT TERM GOAL #3   Title PROM of left shoulder for abduction >/= 70 degrees   Time 4   Period Weeks   Status Achieved   PT SHORT TERM GOAL #4   Title pain with daily activities decreased >/= 4/10   Time 4   Period Weeks   Status On-going           PT Long Term Goals - 04/10/15 1551    PT LONG TERM GOAL #1   Title Indepenent with HEP and understand how to progress   Baseline not educated yet   Time 8   Period Weeks   Status On-going   PT LONG TERM GOAL #2   Title left shoulder AROM >/= 90 degrees so she can reach at shoulder height   Baseline 0 degrees as per MD orders   Time 8   Period  Weeks   Status On-going   PT LONG TERM GOAL #3   Title left shoulder AROM abduction >/= 90 degrees so she can wash herself   Baseline 80 degrees today   Time 8   Period Weeks   Status On-going   PT LONG TERM GOAL #4   Title ability to dress herself due to left shoulder strength >/= 3+/5   Baseline unable to dress herself   Time 8   Period Weeks   Status On-going   PT LONG TERM GOAL #5   Title grip strength on left >/= 30 pounds to hold items in left hand   Baseline 16 pounds   Time 8   Period Weeks   Status On-going               Plan - 04/18/15 1544    Clinical Impression Statement ms Searight reports that she feels like she is making progress. She was able to do all exercises with report of soreness with supine circles but was able to complete the exercise. Opted for E-stim and heat following treatment to help decreased soreness follow manual ROM.    PT Next Visit Plan Continue to follow protocol, AAROM and PROM of Lt shoulder   PT Home Exercise Plan HEP review   Consulted and Agree with Plan of Care Patient        Problem List Patient Active Problem List   Diagnosis Date Noted  . Fracture of glenoid process of left scapula 12/29/2014  . Status post total shoulder arthroplasty 12/27/2014  . Cocaine dependence in remission ([place]) 09/29/2014  . Severe heroin dependence in sustained remission ([place]) 09/29/2014  . Opiate abuse, episodic 09/29/2014  . Mild tetrahydrocannabinol (THC) abuse 09/29/2014  . Adjustment disorder with depressed mood 09/29/2014  . Chronic pain syndrome 04/18/2014  . Primary osteoarthritis of left shoulder 04/18/2014  . Primary osteoarthritis of right knee 04/18/2014  . Primary osteoarthritis of left knee 04/18/2014  . ANXIETY 03/22/2010  . HYPERTENSION, BENIGN ESSENTIAL 03/22/2010  . ASTHMA 03/22/2010  . CONSTIPATION 03/22/2010  . HEPATITIS C 06/04/1995   Starr Lake PT, DPT, LAT, ATC  04/18/2015  4:40 PM    [facility] [address] [place], [place], [address] Phone: [phone]   Fax:  [phone]  Name: Kaybrie  JAMEAH ROUBIDEAUX MRN: [id number] Date of Birth: 1945/10/11

## 2015-04-20 ENCOUNTER — Ambulatory Visit: Payer: Medicare HMO | Admitting: Physical Therapy

## 2015-04-20 DIAGNOSIS — R600 Localized edema: Secondary | ICD-10-CM

## 2015-04-20 DIAGNOSIS — M2560 Stiffness of unspecified joint, not elsewhere classified: Secondary | ICD-10-CM | POA: Diagnosis not present

## 2015-04-20 DIAGNOSIS — IMO0002 Reserved for concepts with insufficient information to code with codable children: Secondary | ICD-10-CM

## 2015-04-20 DIAGNOSIS — M25622 Stiffness of left elbow, not elsewhere classified: Secondary | ICD-10-CM

## 2015-04-20 DIAGNOSIS — R531 Weakness: Secondary | ICD-10-CM

## 2015-04-20 NOTE — Therapy (Signed)
[place], [place], [address] Phone: [phone]   Fax:  [phone]  Physical Therapy Treatment  Patient Details  Name: Ashley Riddle MRN: [id number] Date of Birth: June 15, 1945 Referring Provider: dr. Collier Salina whitfield  Encounter Date: 04/20/2015      PT End of Session - 04/20/15 1552    Visit Number 13   Number of Visits 26   Date for PT Re-Evaluation 05/23/15   Authorization Type Medicare/Medicaid, Kx modifier by 15th visit, progress note by 18th visit   PT Start Time 0350      Past Medical History  Diagnosis Date  . Arthritis   . Hypertension   . DDD (degenerative disc disease), lumbar   . DDD (degenerative disc disease), cervical   . HTN (hypertension)   . Gout   . Anxiety   . Constipation   . Hepatitis C     treated  . Pneumonia 12/02/13  . Heart murmur     probable bicuspid AV with mild AS, mild MR by 04/22/14 Echo (Dr. Montez Morita)    Past Surgical History  Procedure Laterality Date  . Abdominal hysterectomy    . Cesarean section      x 3  . Back surgery    . Appendectomy    . Inner ear surgery      blood vessel  . Tonsillectomy    . Colonoscopy w/ polypectomy    . Total shoulder arthroplasty Left 12/27/2014    Procedure: TOTAL SHOULDER ARTHROPLASTY;  Surgeon: Garald Balding, MD;  Location: [place];  Service: Orthopedics;  Laterality: Left;    There were no vitals filed for this visit.  Visit Diagnosis:  Stiffness of extremity  Weakness  Localized edema  Stiffness of elbow joint, left      Subjective Assessment - 04/20/15 1552    Subjective not as tight today   Currently in Pain? No/denies            [facility] PT Assessment - 04/20/15 1554    AROM   Left Shoulder Flexion 65 Degrees   Left Shoulder ABduction 75 Degrees                     [facility] Adult PT Treatment/Exercise - 04/20/15 1600    Shoulder Exercises: Supine   Flexion AAROM;20 reps   Flexion Limitations hands clasped   Shoulder Exercises: Pulleys   Flexion 2 minutes  tactile cues for scapula   Shoulder Exercises: ROM/Strengthening   UBE (Upper Arm Bike) L1.5 x 8 min  changing direction very 4 min   Other ROM/Strengthening Exercises Standing UE ranger on floor then step, then seated working flexion mostly   Other ROM/Strengthening Exercises gentle manually resisted ER and IR in supine at neutral x 10 each   Manual Therapy   Passive ROM PROM LT shoulder fllexion                  PT Short Term Goals - 04/20/15 1552    PT SHORT TERM GOAL #1   Title independent with initial HEP   Time 4   Period Weeks   Status Achieved   PT SHORT TERM GOAL #2   Title PROM of left shoulder for flexion >/= 90 degrees   Time 4   Period Weeks   Status Achieved   PT SHORT TERM GOAL #3   Title PROM of left shoulder for abduction >/= 70 degrees   Time 4   Period  Weeks   Status Achieved   PT SHORT TERM GOAL #4   Title pain with daily activities decreased >/= 4/10   Time 4   Period Weeks   Status Achieved           PT Long Term Goals - 04/10/15 1551    PT LONG TERM GOAL #1   Title Indepenent with HEP and understand how to progress   Baseline not educated yet   Time 8   Period Weeks   Status On-going   PT LONG TERM GOAL #2   Title left shoulder AROM >/= 90 degrees so she can reach at shoulder height   Baseline 0 degrees as per MD orders   Time 8   Period Weeks   Status On-going   PT LONG TERM GOAL #3   Title left shoulder AROM abduction >/= 90 degrees so she can wash herself   Baseline 80 degrees today   Time 8   Period Weeks   Status On-going   PT LONG TERM GOAL #4   Title ability to dress herself due to left shoulder strength >/= 3+/5   Baseline unable to dress herself   Time 8   Period Weeks   Status On-going   PT LONG TERM GOAL #5   Title grip strength on left >/= 30 pounds to hold items in left hand   Baseline 16 pounds   Time 8   Period Weeks    Status On-going               Plan - 04/20/15 1647    Clinical Impression Statement AROM improving slowly. Used UE ranger today to work on scapulohumeral rhythm. She did well with this and was abl to reach  95 degrees AAROM.    PT Next Visit Plan Continue to follow protocol, AAROM and PROM of Lt shoulder        Problem List Patient Active Problem List   Diagnosis Date Noted  . Fracture of glenoid process of left scapula 12/29/2014  . Status post total shoulder arthroplasty 12/27/2014  . Cocaine dependence in remission ([place]) 09/29/2014  . Severe heroin dependence in sustained remission ([place]) 09/29/2014  . Opiate abuse, episodic 09/29/2014  . Mild tetrahydrocannabinol (THC) abuse 09/29/2014  . Adjustment disorder with depressed mood 09/29/2014  . Chronic pain syndrome 04/18/2014  . Primary osteoarthritis of left shoulder 04/18/2014  . Primary osteoarthritis of right knee 04/18/2014  . Primary osteoarthritis of left knee 04/18/2014  . ANXIETY 03/22/2010  . HYPERTENSION, BENIGN ESSENTIAL 03/22/2010  . ASTHMA 03/22/2010  . CONSTIPATION 03/22/2010  . HEPATITIS C 06/04/1995    Dorene Ar, [place] 04/20/2015, 4:50 PM  [facility] [address] [place], [place], [address] Phone: [phone]   Fax:  [phone]  Name: Ashley Riddle MRN: [id number] Date of Birth: 04/12/1946

## 2015-04-24 ENCOUNTER — Ambulatory Visit: Payer: Medicare HMO | Admitting: Physical Therapy

## 2015-04-24 DIAGNOSIS — R600 Localized edema: Secondary | ICD-10-CM

## 2015-04-24 DIAGNOSIS — IMO0002 Reserved for concepts with insufficient information to code with codable children: Secondary | ICD-10-CM

## 2015-04-24 DIAGNOSIS — M2560 Stiffness of unspecified joint, not elsewhere classified: Secondary | ICD-10-CM | POA: Diagnosis not present

## 2015-04-24 DIAGNOSIS — R531 Weakness: Secondary | ICD-10-CM

## 2015-04-24 NOTE — Therapy (Signed)
[place], [place], [address] Phone: [phone]   Fax:  [phone]  Physical Therapy Treatment  Patient Details  Name: Ashley Riddle MRN: [id number] Date of Birth: Feb 11, 1946 Referring Provider: dr. Collier Salina whitfield  Encounter Date: 04/24/2015      PT End of Session - 04/24/15 1850    Visit Number 14   Number of Visits 26   Date for PT Re-Evaluation 05/23/15   Authorization Type Medicare/Medicaid, Kx modifier by 15th visit, progress note by 18th visit   PT Start Time 1415   PT Stop Time 1510   PT Time Calculation (min) 55 min   Activity Tolerance Patient tolerated treatment well;Patient limited by pain   Behavior During Therapy [facility] for tasks assessed/performed      Past Medical History  Diagnosis Date  . Arthritis   . Hypertension   . DDD (degenerative disc disease), lumbar   . DDD (degenerative disc disease), cervical   . HTN (hypertension)   . Gout   . Anxiety   . Constipation   . Hepatitis C     treated  . Pneumonia 12/02/13  . Heart murmur     probable bicuspid AV with mild AS, mild MR by 04/22/14 Echo (Dr. Montez Morita)    Past Surgical History  Procedure Laterality Date  . Abdominal hysterectomy    . Cesarean section      x 3  . Back surgery    . Appendectomy    . Inner ear surgery      blood vessel  . Tonsillectomy    . Colonoscopy w/ polypectomy    . Total shoulder arthroplasty Left 12/27/2014    Procedure: TOTAL SHOULDER ARTHROPLASTY;  Surgeon: Garald Balding, MD;  Location: [place];  Service: Orthopedics;  Laterality: Left;    There were no vitals filed for this visit.  Visit Diagnosis:  Stiffness of extremity  Weakness  Localized edema      Subjective Assessment - 04/24/15 1506    Subjective "its really cold today and it is causing me to feel a little sore at a bout a 3/10"    Currently in Pain? Yes   Pain Score 3    Pain Location Shoulder   Pain Orientation  Left   Pain Descriptors / Indicators Aching   Pain Onset More than a month ago   Pain Frequency Intermittent   Aggravating Factors  cold weather   Pain Relieving Factors moving the arm around.                          [facility] Adult PT Treatment/Exercise - 04/24/15 1509    Shoulder Exercises: Seated   Other Seated Exercises bicep curls 2 x 10   4#   Shoulder Exercises: Pulleys   Flexion 2 minutes   ABduction 2 minutes   Shoulder Exercises: ROM/Strengthening   UBE (Upper Arm Bike) L1.5 x 10 min  changing direction every 5 min   Other ROM/Strengthening Exercises gentle manually resisted ER and IR in supine at neutral x 10 each   Hand Exercises   Theraputty - Grip x 15 with yellow putty  with 5 sec hold    Moist Heat Therapy   Number Minutes Moist Heat 15 Minutes   Moist Heat Location Shoulder   Electrical Stimulation   Electrical Stimulation Location Lt shoulder   Electrical Stimulation Action IFC   Electrical Stimulation Parameters 20 min, 100% scan, L8  performed PROM during 10 of e-stim   Electrical Stimulation Goals Pain   Manual Therapy   Joint Mobilization grade 2 inferior/posterior mobs, grade 3 scapular mobs in all directions   Passive ROM PROM LT shoulder flexion, and abduction                PT Education - 04/24/15 1849    Education provided Yes   Education Details to continue with wand exercises at home to continue getting more AROM.    Person(s) Educated Patient   Methods Explanation   Comprehension Verbalized understanding          PT Short Term Goals - 04/20/15 1552    PT SHORT TERM GOAL #1   Title independent with initial HEP   Time 4   Period Weeks   Status Achieved   PT SHORT TERM GOAL #2   Title PROM of left shoulder for flexion >/= 90 degrees   Time 4   Period Weeks   Status Achieved   PT SHORT TERM GOAL #3   Title PROM of left shoulder for abduction >/= 70 degrees   Time 4   Period Weeks   Status Achieved   PT  SHORT TERM GOAL #4   Title pain with daily activities decreased >/= 4/10   Time 4   Period Weeks   Status Achieved           PT Long Term Goals - 04/24/15 1853    PT LONG TERM GOAL #1   Title Independent with HEP and understand how to progress   Baseline not educated yet   Time 8   Period Weeks   Status On-going   PT LONG TERM GOAL #2   Title left shoulder AROM >/= 90 degrees so she can reach at shoulder height   Baseline 0 degrees as per MD orders   Time 8   Period Weeks   Status On-going   PT LONG TERM GOAL #3   Title left shoulder AROM abduction >/= 90 degrees so she can wash herself   Baseline 80 degrees today   Time 8   Period Weeks   Status On-going   PT LONG TERM GOAL #4   Title ability to dress herself due to left shoulder strength >/= 3+/5   Baseline unable to dress herself   Time 8   Period Weeks   Status On-going   PT LONG TERM GOAL #5   Title grip strength on left >/= 30 pounds to hold items in left hand   Baseline 16 pounds   Time 8   Period Weeks   Status On-going               Plan - 04/24/15 1851    Clinical Impression Statement Ashley Riddle demonstrated increased stiffness today and reported increased tightness/pain during PROM. performed shouder PROM with E-stim to help address pt's pain and continued to monitor pt throughout session for increased pain response.    PT Next Visit Plan Continue to follow protocol, AAROM and PROM of Lt shoulder   PT Home Exercise Plan AAROM review    Consulted and Agree with Plan of Care Patient        Problem List Patient Active Problem List   Diagnosis Date Noted  . Fracture of glenoid process of left scapula 12/29/2014  . Status post total shoulder arthroplasty 12/27/2014  . Cocaine dependence in remission ([place]) 09/29/2014  . Severe heroin dependence in sustained remission ([place]) 09/29/2014  .  Opiate abuse, episodic 09/29/2014  . Mild tetrahydrocannabinol (THC) abuse 09/29/2014  . Adjustment  disorder with depressed mood 09/29/2014  . Chronic pain syndrome 04/18/2014  . Primary osteoarthritis of left shoulder 04/18/2014  . Primary osteoarthritis of right knee 04/18/2014  . Primary osteoarthritis of left knee 04/18/2014  . ANXIETY 03/22/2010  . HYPERTENSION, BENIGN ESSENTIAL 03/22/2010  . ASTHMA 03/22/2010  . CONSTIPATION 03/22/2010  . HEPATITIS C 06/04/1995   Starr Lake PT, DPT, LAT, ATC  04/24/2015  7:02 PM     [facility] [address] [place], [place], [address] Phone: [phone]   Fax:  [phone]  Name: Ashley Riddle MRN: [id number] Date of Birth: Apr 28, 1946

## 2015-04-26 ENCOUNTER — Ambulatory Visit: Payer: Medicare HMO | Admitting: Physical Therapy

## 2015-04-26 DIAGNOSIS — IMO0002 Reserved for concepts with insufficient information to code with codable children: Secondary | ICD-10-CM

## 2015-04-26 DIAGNOSIS — M2560 Stiffness of unspecified joint, not elsewhere classified: Secondary | ICD-10-CM | POA: Diagnosis not present

## 2015-04-26 DIAGNOSIS — R531 Weakness: Secondary | ICD-10-CM

## 2015-04-26 NOTE — Therapy (Signed)
[place], [place], [address] Phone: [phone]   Fax:  [phone]  Physical Therapy Treatment  Patient Details  Name: Ashley Riddle MRN: [id number] Date of Birth: 09-06-1945 Referring Provider: dr. Collier Salina Riddle  Encounter Date: 04/26/2015      PT End of Session - 04/26/15 1434    Visit Number 15   Number of Visits 26   Date for PT Re-Evaluation 05/23/15   PT Start Time 8828   PT Stop Time 1432   PT Time Calculation (min) 57 min   Activity Tolerance Patient tolerated treatment well;No increased pain   Behavior During Therapy [facility] for tasks assessed/performed      Past Medical History  Diagnosis Date  . Arthritis   . Hypertension   . DDD (degenerative disc disease), lumbar   . DDD (degenerative disc disease), cervical   . HTN (hypertension)   . Gout   . Anxiety   . Constipation   . Hepatitis C     treated  . Pneumonia 12/02/13  . Heart murmur     probable bicuspid AV with mild AS, mild MR by 04/22/14 Echo (Ashley Riddle)    Past Surgical History  Procedure Laterality Date  . Abdominal hysterectomy    . Cesarean section      x 3  . Back surgery    . Appendectomy    . Inner ear surgery      blood vessel  . Tonsillectomy    . Colonoscopy w/ polypectomy    . Total shoulder arthroplasty Left 12/27/2014    Procedure: TOTAL SHOULDER ARTHROPLASTY;  Surgeon: Ashley Balding, MD;  Location: [place];  Service: Orthopedics;  Laterality: Left;    There were no vitals filed for this visit.  Visit Diagnosis:  Stiffness of extremity  Weakness      Subjective Assessment - 04/26/15 1339    Subjective 1/10 Shoulder,  Heel pain RT severe.   Requires less help with dressing.   Currently in Pain? Yes   Pain Score 1    Pain Location Shoulder   Pain Orientation Left   Pain Descriptors / Indicators Aching                         [facility] Adult PT Treatment/Exercise - 04/26/15  1341    Shoulder Exercises: Seated   Row 10 reps   Theraband Level (Shoulder Row) Level 1 (Yellow)   Other Seated Exercises UE Ranger     Shoulder Exercises: Pulleys   Flexion --  4 minutes, scaption and abduction too within this time.      ABduction --  110 scaption LT   Shoulder Exercises: ROM/Strengthening   UBE (Upper Arm Bike) L1.5 5 minutes each way.  monitored for pain   Other ROM/Strengthening Exercises gentle ROM    LT flexion    Moist Heat Therapy   Number Minutes Moist Heat 15 Minutes   Moist Heat Location Shoulder  cold pack to feet while exercising.    Manual Therapy   Passive ROM Lt shoulder                  PT Short Term Goals - 04/26/15 1407    PT SHORT TERM GOAL #1   Title independent with initial HEP   Status Achieved   PT SHORT TERM GOAL #2   Title PROM of left shoulder for flexion >/= 90 degrees   Time 4  Period Weeks   Status Achieved   PT SHORT TERM GOAL #3   Title PROM of left shoulder for abduction >/= 70 degrees   Status Achieved   PT SHORT TERM GOAL #4   Title pain with daily activities decreased >/= 4/10   Time 4   Period Weeks   Status Achieved           PT Long Term Goals - 04/26/15 1408    PT LONG TERM GOAL #1   Time 8   Period Weeks   Status On-going   PT LONG TERM GOAL #2   Baseline 95 degrees AROMLT   Time 8   Period Weeks   Status Achieved   PT LONG TERM GOAL #3   Title left shoulder AROM abduction >/= 90 degrees so she can wash herself   Baseline 73 degrees   Time 8   Period Weeks   Status On-going   PT LONG TERM GOAL #4   Title ability to dress herself due to left shoulder strength >/= 3+/5   Baseline sometimes needs help   Time 8   Period Weeks   Status On-going   PT LONG TERM GOAL #5   Title grip strength on left >/= 30 pounds to hold items in left hand   Time 8   Period Weeks   Status On-going               Plan - 04/26/15 1435    Clinical Impression Statement AROM gaoal met   PT Next  Visit Plan Continue to follow protocol, AAROM and PROM of Lt shoulder   Consulted and Agree with Plan of Care Patient        Problem List Patient Active Problem List   Diagnosis Date Noted  . Fracture of glenoid process of left scapula 12/29/2014  . Status post total shoulder arthroplasty 12/27/2014  . Cocaine dependence in remission ([place]) 09/29/2014  . Severe heroin dependence in sustained remission ([place]) 09/29/2014  . Opiate abuse, episodic 09/29/2014  . Mild tetrahydrocannabinol (THC) abuse 09/29/2014  . Adjustment disorder with depressed mood 09/29/2014  . Chronic pain syndrome 04/18/2014  . Primary osteoarthritis of left shoulder 04/18/2014  . Primary osteoarthritis of right knee 04/18/2014  . Primary osteoarthritis of left knee 04/18/2014  . ANXIETY 03/22/2010  . HYPERTENSION, BENIGN ESSENTIAL 03/22/2010  . ASTHMA 03/22/2010  . CONSTIPATION 03/22/2010  . HEPATITIS C 06/04/1995    Ashley Riddle 04/26/2015, 2:43 PM  [facility] [address] [place], [place], [address] Phone: [phone]   Fax:  [phone]  Name: Ashley Riddle MRN: [id number] Date of Birth: [birth year]    Ashley Riddle, PTA 04/26/2015 2:43 PM Phone: [phone] Fax: [phone]

## 2015-05-02 ENCOUNTER — Ambulatory Visit: Payer: Medicare HMO | Admitting: Physical Therapy

## 2015-05-04 ENCOUNTER — Ambulatory Visit: Payer: Medicare HMO | Attending: Orthopaedic Surgery | Admitting: Physical Therapy

## 2015-05-04 DIAGNOSIS — M2560 Stiffness of unspecified joint, not elsewhere classified: Secondary | ICD-10-CM | POA: Diagnosis not present

## 2015-05-04 DIAGNOSIS — R531 Weakness: Secondary | ICD-10-CM | POA: Diagnosis present

## 2015-05-04 DIAGNOSIS — R600 Localized edema: Secondary | ICD-10-CM | POA: Insufficient documentation

## 2015-05-04 DIAGNOSIS — IMO0002 Reserved for concepts with insufficient information to code with codable children: Secondary | ICD-10-CM

## 2015-05-04 DIAGNOSIS — M25622 Stiffness of left elbow, not elsewhere classified: Secondary | ICD-10-CM | POA: Diagnosis present

## 2015-05-04 NOTE — Therapy (Signed)
[place], [place], [address] Phone: [phone]   Fax:  [phone]  Physical Therapy Treatment  Patient Details  Name: Ashley Riddle MRN: [id number] Date of Birth: [birth year] Referring Provider: dr. Collier Salina whitfield  Encounter Date: 05/04/2015      PT End of Session - 05/04/15 1529    Visit Number 15   Number of Visits 26   Date for PT Re-Evaluation 05/23/15   PT Start Time 59   PT Stop Time 1547   PT Time Calculation (min) 23 min      Past Medical History  Diagnosis Date  . Arthritis   . Hypertension   . DDD (degenerative disc disease), lumbar   . DDD (degenerative disc disease), cervical   . HTN (hypertension)   . Gout   . Anxiety   . Constipation   . Hepatitis C     treated  . Pneumonia 12/02/13  . Heart murmur     probable bicuspid AV with mild AS, mild MR by 04/22/14 Echo (Dr. Montez Morita)    Past Surgical History  Procedure Laterality Date  . Abdominal hysterectomy    . Cesarean section      x 3  . Back surgery    . Appendectomy    . Inner ear surgery      blood vessel  . Tonsillectomy    . Colonoscopy w/ polypectomy    . Total shoulder arthroplasty Left 12/27/2014    Procedure: TOTAL SHOULDER ARTHROPLASTY;  Surgeon: Garald Balding, MD;  Location: [place];  Service: Orthopedics;  Laterality: Left;    There were no vitals filed for this visit.  Visit Diagnosis:  Stiffness of extremity  Weakness  Localized edema  Stiffness of elbow joint, left      Subjective Assessment - 05/04/15 1526    Subjective Patient arrived with no pain in the shoulder, but did report some stiffness.   Currently in Pain? No/denies                         [facility] Adult PT Treatment/Exercise - 05/04/15 1633    Shoulder Exercises: Seated   Row 10 reps   Theraband Level (Shoulder Row) Level 1 (Yellow)   Other Seated Exercises UE Ranger     Manual Therapy   Passive ROM Lt  shoulder   Ankle Exercises: Aerobic   Stationary Bike UBE L1 4 minutes 4 minutes backwards                  PT Short Term Goals - 04/26/15 1407    PT SHORT TERM GOAL #1   Title independent with initial HEP   Status Achieved   PT SHORT TERM GOAL #2   Title PROM of left shoulder for flexion >/= 90 degrees   Time 4   Period Weeks   Status Achieved   PT SHORT TERM GOAL #3   Title PROM of left shoulder for abduction >/= 70 degrees   Status Achieved   PT SHORT TERM GOAL #4   Title pain with daily activities decreased >/= 4/10   Time 4   Period Weeks   Status Achieved           PT Long Term Goals - 04/26/15 1408    PT LONG TERM GOAL #1   Time 8   Period Weeks   Status On-going   PT LONG TERM GOAL #2   Baseline 95 degrees  AROMLT   Time 8   Period Weeks   Status Achieved   PT LONG TERM GOAL #3   Title left shoulder AROM abduction >/= 90 degrees so she can wash herself   Baseline 73 degrees   Time 8   Period Weeks   Status On-going   PT LONG TERM GOAL #4   Title ability to dress herself due to left shoulder strength >/= 3+/5   Baseline sometimes needs help   Time 8   Period Weeks   Status On-going   PT LONG TERM GOAL #5   Title grip strength on left >/= 30 pounds to hold items in left hand   Time 8   Period Weeks   Status On-going               Plan - 05/04/15 1531    Clinical Impression Statement Patient arrived late. Focused on Rom and gentle strengthening of the shoulder. Pain did not increase during exercise        Problem List Patient Active Problem List   Diagnosis Date Noted  . Fracture of glenoid process of left scapula 12/29/2014  . Status post total shoulder arthroplasty 12/27/2014  . Cocaine dependence in remission ([place]) 09/29/2014  . Severe heroin dependence in sustained remission ([place]) 09/29/2014  . Opiate abuse, episodic 09/29/2014  . Mild tetrahydrocannabinol (THC) abuse 09/29/2014  . Adjustment disorder with depressed  mood 09/29/2014  . Chronic pain syndrome 04/18/2014  . Primary osteoarthritis of left shoulder 04/18/2014  . Primary osteoarthritis of right knee 04/18/2014  . Primary osteoarthritis of left knee 04/18/2014  . ANXIETY 03/22/2010  . HYPERTENSION, BENIGN ESSENTIAL 03/22/2010  . ASTHMA 03/22/2010  . CONSTIPATION 03/22/2010  . HEPATITIS C 06/04/1995   Laury [place], [place] 05/04/2015 4:35 PM [phone] [facility] [place], [place], [address] Phone: [phone]   Fax:  [phone]  Name: Ashley Riddle MRN: [id number] Date of Birth: [birth year]

## 2015-05-08 ENCOUNTER — Ambulatory Visit: Payer: Medicare HMO | Admitting: Physical Therapy

## 2015-05-09 ENCOUNTER — Ambulatory Visit: Payer: Medicare HMO | Admitting: Physical Therapy

## 2015-05-09 DIAGNOSIS — M2560 Stiffness of unspecified joint, not elsewhere classified: Secondary | ICD-10-CM | POA: Diagnosis not present

## 2015-05-09 DIAGNOSIS — R531 Weakness: Secondary | ICD-10-CM

## 2015-05-09 DIAGNOSIS — IMO0002 Reserved for concepts with insufficient information to code with codable children: Secondary | ICD-10-CM

## 2015-05-09 NOTE — Therapy (Signed)
[place], [place], [address] Phone: [phone]   Fax:  [phone]  Physical Therapy Treatment  Patient Details  Name: Ashley Riddle MRN: [id number] Date of Birth: [birth year] Referring Provider: dr. Collier Salina whitfield  Encounter Date: 05/09/2015      PT End of Session - 05/09/15 1143    Visit Number 16   Number of Visits 26   Date for PT Re-Evaluation 05/23/15   PT Start Time 1100   PT Stop Time 1200   PT Time Calculation (min) 60 min   Activity Tolerance Patient tolerated treatment well;No increased pain   Behavior During Therapy [facility] for tasks assessed/performed      Past Medical History  Diagnosis Date  . Arthritis   . Hypertension   . DDD (degenerative disc disease), lumbar   . DDD (degenerative disc disease), cervical   . HTN (hypertension)   . Gout   . Anxiety   . Constipation   . Hepatitis C     treated  . Pneumonia 12/02/13  . Heart murmur     probable bicuspid AV with mild AS, mild MR by 04/22/14 Echo (Dr. Montez Morita)    Past Surgical History  Procedure Laterality Date  . Abdominal hysterectomy    . Cesarean section      x 3  . Back surgery    . Appendectomy    . Inner ear surgery      blood vessel  . Tonsillectomy    . Colonoscopy w/ polypectomy    . Total shoulder arthroplasty Left 12/27/2014    Procedure: TOTAL SHOULDER ARTHROPLASTY;  Surgeon: Garald Balding, MD;  Location: [facility];  Service: Orthopedics;  Laterality: Left;    There were no vitals filed for this visit.  Visit Diagnosis:  Stiffness of extremity  Weakness      Subjective Assessment - 05/09/15 1102    Subjective No shoulder pain.  I can lift my arm more.  Knees and heel feels better.  Janeal Holmes been moving it more and more,  Sleeping better.  Has been getting special knee injections ang My knees feel a lot better.  able to pull jeans up and  use arm to put lotion on hair.     Currently in Pain? No/denies   Pain Location Shoulder   Pain Orientation Left   Multiple Pain Sites --  Has som mild heel pain RT.                            [facility] Adult PT Treatment/Exercise - 05/09/15 1114    Shoulder Exercises: Seated   Row 10 reps   Theraband Level (Shoulder Row) Level 1 (Yellow)  both   External Rotation Strengthening;Both;10 reps   Shoulder Exercises: Standing   Extension Strengthening;Left;10 reps;Theraband;Other (comment)   Extension Limitations yellow band doubled.   Shoulder Exercises: ROM/Strengthening   UBE (Upper Arm Bike) L2 4 minutes each way.     Hand Exercises   Other Hand Exercises wrisr flexion/extension , 4 LBS 10 X arm resting on thigh   Other Hand Exercises supination, pronation forearm  10 X 4 LBS barbell    Moist Heat Therapy   Number Minutes Moist Heat 15 Minutes   Moist Heat Location Shoulder  cold pack to RT foot while exercises                  PT Short Term Goals - 05/09/15 1106  PT SHORT TERM GOAL #1   Title independent with initial HEP   Time 4   Period Weeks   Status Achieved   PT SHORT TERM GOAL #2   Title PROM of left shoulder for flexion >/= 90 degrees   Time 4   Period Weeks   Status Achieved   PT SHORT TERM GOAL #3   Title PROM of left shoulder for abduction >/= 70 degrees   Time 4   Period Weeks   Status Achieved   PT SHORT TERM GOAL #4   Title pain with daily activities decreased >/= 4/10   Time 4   Period Weeks   Status Achieved           PT Long Term Goals - 05/09/15 1107    PT LONG TERM GOAL #1   Baseline on going   Time 8   Period Weeks   Status On-going   PT LONG TERM GOAL #2   Title left shoulder AROM >/= 90 degrees so she can reach at shoulder height   Time 8   Period Weeks   Status Achieved   PT LONG TERM GOAL #3   Title left shoulder AROM abduction >/= 90 degrees so she can wash herself   Baseline 72   Time 8   Period Weeks   Status On-going   PT LONG TERM GOAL #4   Title ability to  dress herself due to left shoulder strength >/= 3+/5   Baseline dresses herself,  Strength 2/5   Time 8   Period Weeks   Status Partially Met   PT LONG TERM GOAL #5   Title grip strength on left >/= 30 pounds to hold items in left hand   Baseline 37 Lbs average   Time 8   Period Weeks   Status Achieved               Plan - 05/09/15 1144    Clinical Impression Statement grip 37 LBS.  LTG#5,met, LTG #4 partially met.  Shoulder no pain post session feels tight only.   PT Next Visit Plan Needs to see PT prior to 12/20 /16 Protocol   PT Home Exercise Plan continue   Consulted and Agree with Plan of Care Patient        Problem List Patient Active Problem List   Diagnosis Date Noted  . Fracture of glenoid process of left scapula 12/29/2014  . Status post total shoulder arthroplasty 12/27/2014  . Cocaine dependence in remission ([place]) 09/29/2014  . Severe heroin dependence in sustained remission ([place]) 09/29/2014  . Opiate abuse, episodic 09/29/2014  . Mild tetrahydrocannabinol (THC) abuse 09/29/2014  . Adjustment disorder with depressed mood 09/29/2014  . Chronic pain syndrome 04/18/2014  . Primary osteoarthritis of left shoulder 04/18/2014  . Primary osteoarthritis of right knee 04/18/2014  . Primary osteoarthritis of left knee 04/18/2014  . ANXIETY 03/22/2010  . HYPERTENSION, BENIGN ESSENTIAL 03/22/2010  . ASTHMA 03/22/2010  . CONSTIPATION 03/22/2010  . HEPATITIS C 06/04/1995    Ashley Riddle 05/09/2015, 1:00 PM  [facility] [address] [place], [place], [address] Phone: [phone]   Fax:  [phone]  Name: Ashley Riddle MRN: [id number] Date of Birth: 1945/10/10    [name], PTA 05/09/2015 1:00 PM Phone: [phone] Fax: [phone]

## 2015-05-18 ENCOUNTER — Ambulatory Visit: Payer: Medicare HMO | Admitting: Physical Therapy

## 2015-05-22 ENCOUNTER — Ambulatory Visit: Payer: Medicare HMO | Admitting: Physical Therapy

## 2015-05-22 DIAGNOSIS — M2560 Stiffness of unspecified joint, not elsewhere classified: Principal | ICD-10-CM

## 2015-05-22 DIAGNOSIS — R531 Weakness: Secondary | ICD-10-CM

## 2015-05-22 DIAGNOSIS — IMO0002 Reserved for concepts with insufficient information to code with codable children: Secondary | ICD-10-CM

## 2015-05-22 NOTE — Therapy (Signed)
[place], [place], [address] Phone: [phone]   Fax:  [phone]  Physical Therapy Treatment  Patient Details  Name: Ashley Riddle MRN: [id number] Date of Birth: Aug 26, 1945 Referring Provider: dr. Collier Salina whitfield  Encounter Date: 05/22/2015      PT End of Session - 05/22/15 1325    Visit Number 17   Number of Visits 26   Date for PT Re-Evaluation 05/23/15   PT Start Time 1017   PT Stop Time 1117   PT Time Calculation (min) 60 min   Activity Tolerance Patient tolerated treatment well;No increased pain   Behavior During Therapy [facility] for tasks assessed/performed      Past Medical History  Diagnosis Date  . Arthritis   . Hypertension   . DDD (degenerative disc disease), lumbar   . DDD (degenerative disc disease), cervical   . HTN (hypertension)   . Gout   . Anxiety   . Constipation   . Hepatitis C     treated  . Pneumonia 12/02/13  . Heart murmur     probable bicuspid AV with mild AS, mild MR by 04/22/14 Echo (Dr. Montez Morita)    Past Surgical History  Procedure Laterality Date  . Abdominal hysterectomy    . Cesarean section      x 3  . Back surgery    . Appendectomy    . Inner ear surgery      blood vessel  . Tonsillectomy    . Colonoscopy w/ polypectomy    . Total shoulder arthroplasty Left 12/27/2014    Procedure: TOTAL SHOULDER ARTHROPLASTY;  Surgeon: Garald Balding, MD;  Location: [place];  Service: Orthopedics;  Laterality: Left;    There were no vitals filed for this visit.  Visit Diagnosis:  Stiffness of extremity  Weakness      Subjective Assessment - 05/22/15 1023    Subjective NO shoulder pain..  Feels numb.  I hear cracklin.  I am moving more.  Has feet pain, knee pain severe.  Knees 100%.  Shots have helped a lot.     Currently in Pain? No/denies   Pain Location Shoulder   Pain Orientation Left   Pain Descriptors / Indicators Numbness   Pain Frequency  Intermittent   Aggravating Factors  cold   Pain Relieving Factors keeping shoulder warm                         [facility] Adult PT Treatment/Exercise - 05/22/15 1027    Shoulder Exercises: Seated   Row 20 reps   Theraband Level (Shoulder Row) Level 1 (Yellow)   External Rotation AROM  10 X   Other Seated Exercises UE ranger  standing on wall guided flexion and standing circles horizontal moves .Marland Kitchen 10+ reps.  Comfortable.  /   Other Seated Exercises Triceps , 20 X yellow band.     Shoulder Exercises: Pulleys   Flexion 2 minutes   Shoulder Exercises: ROM/Strengthening   UBE (Upper Arm Bike) L2  X minutes  5   Moist Heat Therapy   Number Minutes Moist Heat 15 Minutes   Moist Heat Location Shoulder                  PT Short Term Goals - 05/22/15 1327    PT SHORT TERM GOAL #1   Title independent with initial HEP   Status Achieved   PT SHORT TERM GOAL #2  Title PROM of left shoulder for flexion >/= 90 degrees   Status Achieved   PT SHORT TERM GOAL #3   Title PROM of left shoulder for abduction >/= 70 degrees   Status Achieved   PT SHORT TERM GOAL #4   Title pain with daily activities decreased >/= 4/10   Status Achieved           PT Long Term Goals - 05/22/15 1328    PT LONG TERM GOAL #1   Title Independent with HEP and understand how to progress   Time 8   Period Weeks   Status On-going   PT LONG TERM GOAL #2   Title left shoulder AROM >/= 90 degrees so she can reach at shoulder height   Baseline 100 Scs[place]ion   Time 8   Period Weeks   Status Achieved   PT LONG TERM GOAL #3   Title left shoulder AROM abduction >/= 90 degrees so she can wash herself   Time 8   Period Weeks   Status On-going   PT LONG TERM GOAL #4   Title ability to dress herself due to left shoulder strength >/= 3+/5   Baseline dresses herself,  Strength 2/5   Time 8   Period Weeks   Status Partially Met   PT LONG TERM GOAL #5   Title grip strength on left >/= 30  pounds to hold items in left hand   Time 8   Period Weeks   Status Achieved               Plan - 05/22/15 1326    Clinical Impression Statement 100 degrees scaption. AROM.  No shoulder pain.     PT Next Visit Plan Needs to see PT next visit   PT Home Exercise Plan continue   Consulted and Agree with Plan of Care Patient        Problem List Patient Active Problem List   Diagnosis Date Noted  . Fracture of glenoid process of left scapula 12/29/2014  . Status post total shoulder arthroplasty 12/27/2014  . Cocaine dependence in remission ([place]) 09/29/2014  . Severe heroin dependence in sustained remission ([place]) 09/29/2014  . Opiate abuse, episodic 09/29/2014  . Mild tetrahydrocannabinol (THC) abuse 09/29/2014  . Adjustment disorder with depressed mood 09/29/2014  . Chronic pain syndrome 04/18/2014  . Primary osteoarthritis of left shoulder 04/18/2014  . Primary osteoarthritis of right knee 04/18/2014  . Primary osteoarthritis of left knee 04/18/2014  . ANXIETY 03/22/2010  . HYPERTENSION, BENIGN ESSENTIAL 03/22/2010  . ASTHMA 03/22/2010  . CONSTIPATION 03/22/2010  . HEPATITIS C 06/04/1995    [name],[name] 05/22/2015, 1:30 PM  [facility] [address] [place], [place], [address] Phone: [phone]   Fax:  [phone]  Name: ROSALINE EZEKIEL MRN: [id number] Date of Birth: 16-Mar-1946    [name], PTA 05/22/2015 1:30 PM Phone: [phone] Fax: [phone] .

## 2015-05-24 ENCOUNTER — Encounter: Payer: Medicare HMO | Admitting: Physical Therapy

## 2015-06-07 ENCOUNTER — Telehealth: Payer: Self-pay | Admitting: Physical Therapy

## 2015-06-07 ENCOUNTER — Ambulatory Visit: Payer: Medicare HMO | Attending: Orthopaedic Surgery | Admitting: Physical Therapy

## 2015-06-07 ENCOUNTER — Encounter: Payer: Self-pay | Admitting: Physical Therapy

## 2015-06-07 DIAGNOSIS — R600 Localized edema: Secondary | ICD-10-CM | POA: Insufficient documentation

## 2015-06-07 DIAGNOSIS — M2560 Stiffness of unspecified joint, not elsewhere classified: Secondary | ICD-10-CM | POA: Insufficient documentation

## 2015-06-07 DIAGNOSIS — R531 Weakness: Secondary | ICD-10-CM | POA: Insufficient documentation

## 2015-06-07 NOTE — Telephone Encounter (Signed)
LVM that she missed her last scheduled visit today and if she wanted to continue with therapy to call back and get rescheduled. If not we can discharge her from physical therapy.

## 2015-06-14 ENCOUNTER — Ambulatory Visit: Payer: Medicare HMO | Admitting: Physical Therapy

## 2015-06-14 DIAGNOSIS — M2560 Stiffness of unspecified joint, not elsewhere classified: Principal | ICD-10-CM

## 2015-06-14 DIAGNOSIS — R600 Localized edema: Secondary | ICD-10-CM | POA: Diagnosis present

## 2015-06-14 DIAGNOSIS — R531 Weakness: Secondary | ICD-10-CM

## 2015-06-14 DIAGNOSIS — IMO0002 Reserved for concepts with insufficient information to code with codable children: Secondary | ICD-10-CM

## 2015-06-14 NOTE — Therapy (Signed)
[place], [place], [address] Phone: [phone]   Fax:  [phone]  Physical Therapy Treatment / Discharge Note  Patient Details  Name: Ashley Riddle MRN: [id number] Date of Birth: 04-15-1946 Referring Provider: dr. Collier Salina whitfield  Encounter Date: 06/14/2015      PT End of Session - 06/14/15 1151    Visit Number 18   Number of Visits 26   Date for PT Re-Evaluation 06/13/14   Authorization Type Medicare/Medicaid, Kx modifier by 15th visit, progress note by 18th visit   PT Start Time 1145   PT Stop Time 1215   PT Time Calculation (min) 30 min   Activity Tolerance Patient tolerated treatment well   Behavior During Therapy [facility] for tasks assessed/performed      Past Medical History  Diagnosis Date  . Arthritis   . Hypertension   . DDD (degenerative disc disease), lumbar   . DDD (degenerative disc disease), cervical   . HTN (hypertension)   . Gout   . Anxiety   . Constipation   . Hepatitis C     treated  . Pneumonia 12/02/13  . Heart murmur     probable bicuspid AV with mild AS, mild MR by 04/22/14 Echo (Dr. Montez Morita)    Past Surgical History  Procedure Laterality Date  . Abdominal hysterectomy    . Cesarean section      x 3  . Back surgery    . Appendectomy    . Inner ear surgery      blood vessel  . Tonsillectomy    . Colonoscopy w/ polypectomy    . Total shoulder arthroplasty Left 12/27/2014    Procedure: TOTAL SHOULDER ARTHROPLASTY;  Surgeon: Garald Balding, MD;  Location: [place];  Service: Orthopedics;  Laterality: Left;    There were no vitals filed for this visit.  Visit Diagnosis:  Stiffness of extremity - Plan: PT plan of care cert/re-cert  Weakness - Plan: PT plan of care cert/re-cert  Localized edema - Plan: PT plan of care cert/re-cert      Subjective Assessment - 06/14/15 1144    Subjective "I have no pain, I feel like I am doing much better with my shoulder now  than I was doing"    Currently in Pain? No/denies   Pain Score 0-No pain   Pain Location Shoulder   Pain Orientation Left            [facility] PT Assessment - 06/14/15 1153    Observation/Other Assessments   Focus on Therapeutic Outcomes (FOTO)  15% limited   AROM   Left Shoulder Extension 62 Degrees   Left Shoulder Flexion 93 Degrees   Left Shoulder ABduction 90 Degrees   Left Shoulder Internal Rotation 30 Degrees   Left Shoulder External Rotation 24 Degrees   PROM   Left Shoulder Flexion 118 Degrees   Left Shoulder ABduction 110 Degrees   Left Shoulder Internal Rotation 36 Degrees   Left Shoulder External Rotation 28 Degrees   Strength   Left Shoulder Flexion 3/5   Left Shoulder Extension 4-/5   Left Shoulder ABduction 3/5   Left Shoulder Internal Rotation 3+/5   Left Shoulder External Rotation 3-/5   Left Hand Grip (lbs) 40#  40,41,39                     [facility] Adult PT Treatment/Exercise - 06/14/15 1148    Shoulder Exercises: Pulleys   Flexion 2 minutes  ABduction 2 minutes   Shoulder Exercises: ROM/Strengthening   UBE (Upper Arm Bike) L1.5 x 8 min  alt direction every 4 min                PT Education - 06/27/2015 07/27/12    Education provided Yes   Education Details HEP Review   Person(s) Educated Patient   Methods Explanation   Comprehension Verbalized understanding          PT Short Term Goals - 05/22/15 1327    PT SHORT TERM GOAL #1   Title independent with initial HEP   Status Achieved   PT SHORT TERM GOAL #2   Title PROM of left shoulder for flexion >/= 90 degrees   Status Achieved   PT SHORT TERM GOAL #3   Title PROM of left shoulder for abduction >/= 70 degrees   Status Achieved   PT SHORT TERM GOAL #4   Title pain with daily activities decreased >/= 4/10   Status Achieved           PT Long Term Goals - 06-27-2015 1208    PT LONG TERM GOAL #1   Title Independent with HEP and understand how to progress   Time 8    Period Weeks   Status Achieved   PT LONG TERM GOAL #2   Title left shoulder AROM >/= 90 degrees so she can reach at shoulder height   Time 8   Period Weeks   Status Achieved   PT LONG TERM GOAL #3   Title left shoulder AROM abduction >/= 90 degrees so she can wash herself   Time 8   Period Weeks   Status Achieved   PT LONG TERM GOAL #4   Title ability to dress herself due to left shoulder strength >/= 3+/5   Time 8   Period Weeks   Status Partially Met   PT LONG TERM GOAL #5   Title grip strength on left >/= 30 pounds to hold items in left hand   Time 8   Period Weeks   Status Achieved               Plan - June 27, 2015 1215    Clinical Impression Statement Ashley Riddle reports that she has been doing well  and has been keeping up with her exercises. She has demonstrated improvement in her AROM in all planes of her L shoulder mobility and reported no pain during assessment. She also improved with strength. She as met all goals except for partially meeting LTG #4. She reports she is able to maintain and progress her current level of function independenlty and will be discharged from physical therapy today.     PT Next Visit Plan D/C   Consulted and Agree with Plan of Care Patient          G-Codes - June 27, 2015 07-27-1216    Functional Assessment Tool Used Clinical judgement/ FOTO 15% limited   Functional Limitation Carrying, moving and handling objects   Carrying, Moving and Handling Objects Goal Status ([id number]) At least 40 percent but less than 60 percent impaired, limited or restricted   Carrying, Moving and Handling Objects Discharge Status [id number]) At least 1 percent but less than 20 percent impaired, limited or restricted      Problem List Patient Active Problem List   Diagnosis Date Noted  . Fracture of glenoid process of left scapula 12/29/2014  . Status post total shoulder arthroplasty 12/27/2014  . Cocaine dependence in remission ([place])  09/29/2014  . Severe heroin dependence  in sustained remission ([place]) 09/29/2014  . Opiate abuse, episodic 09/29/2014  . Mild tetrahydrocannabinol (THC) abuse 09/29/2014  . Adjustment disorder with depressed mood 09/29/2014  . Chronic pain syndrome 04/18/2014  . Primary osteoarthritis of left shoulder 04/18/2014  . Primary osteoarthritis of right knee 04/18/2014  . Primary osteoarthritis of left knee 04/18/2014  . ANXIETY 03/22/2010  . HYPERTENSION, BENIGN ESSENTIAL 03/22/2010  . ASTHMA 03/22/2010  . CONSTIPATION 03/22/2010  . HEPATITIS C 06/04/1995   Starr [place] PT, DPT, LAT, ATC  06/14/2015  12:20 PM     [facility] [address] [place], [place], [address] Phone: [phone]   Fax:  [phone]  Name: Ashley Riddle MRN: [id number] Date of Birth: December 23, 1945     PHYSICAL THERAPY DISCHARGE SUMMARY  Visits from Start of Care: 18  Current functional level related to goals / functional outcomes: FOTO 15% limited   Remaining deficits: Limited AROM/PROM and weakness of L shoulder compared bil.    Education / Equipment: HEP, theraband for strengthening.   Plan: Patient agrees to discharge.  Patient goals were partially met. Patient is being discharged due to being pleased with the current functional level.  ?????

## 2015-11-17 ENCOUNTER — Other Ambulatory Visit: Payer: Self-pay | Admitting: Nurse Practitioner

## 2015-11-17 DIAGNOSIS — K7469 Other cirrhosis of liver: Secondary | ICD-10-CM

## 2015-11-24 ENCOUNTER — Other Ambulatory Visit: Payer: Self-pay

## 2015-11-30 ENCOUNTER — Ambulatory Visit
Admission: RE | Admit: 2015-11-30 | Discharge: 2015-11-30 | Disposition: A | Discharge status: Home / Self Care | Payer: Medicare HMO | Source: Ambulatory Visit | Attending: Nurse Practitioner | Admitting: Nurse Practitioner

## 2015-11-30 DIAGNOSIS — K7469 Other cirrhosis of liver: Secondary | ICD-10-CM

## 2015-12-12 ENCOUNTER — Emergency Department (HOSPITAL_COMMUNITY): Payer: Medicare HMO

## 2015-12-12 ENCOUNTER — Encounter (HOSPITAL_COMMUNITY): Payer: Self-pay | Admitting: *Deleted

## 2015-12-12 ENCOUNTER — Emergency Department (HOSPITAL_COMMUNITY)
Admission: EM | Admit: 2015-12-12 | Discharge: 2015-12-12 | Disposition: A | Discharge status: Home / Self Care | Payer: Medicare HMO | Attending: Emergency Medicine | Admitting: Emergency Medicine

## 2015-12-12 DIAGNOSIS — I10 Essential (primary) hypertension: Secondary | ICD-10-CM | POA: Insufficient documentation

## 2015-12-12 DIAGNOSIS — M25512 Pain in left shoulder: Secondary | ICD-10-CM

## 2015-12-12 DIAGNOSIS — M7989 Other specified soft tissue disorders: Secondary | ICD-10-CM

## 2015-12-12 DIAGNOSIS — Z87891 Personal history of nicotine dependence: Secondary | ICD-10-CM | POA: Insufficient documentation

## 2015-12-12 DIAGNOSIS — Z79899 Other long term (current) drug therapy: Secondary | ICD-10-CM | POA: Insufficient documentation

## 2015-12-12 DIAGNOSIS — M1990 Unspecified osteoarthritis, unspecified site: Secondary | ICD-10-CM | POA: Insufficient documentation

## 2015-12-12 MED ORDER — NAPROXEN 500 MG PO TABS: 500.0000 mg | ORAL_TABLET | Freq: Two times a day (BID) | ORAL | Status: DC

## 2015-12-12 NOTE — ED Notes (Signed)
PA at the bedside.

## 2015-12-12 NOTE — ED Provider Notes (Signed)
CSN: [id number]     Arrival date & time 12/12/15  1651 History   First MD Initiated Contact with Patient 12/12/15 2056     Chief Complaint  Patient presents with  . Shoulder Pain   HPI  Ashley Riddle is a 70 year old female with PMHx of HTN, arthritis and DDD presenting with left shoulder pain 2 weeks and a mass to the left posterior scapula. Patient states she has had intermittent pain since her left shoulder arthroplasty performed 3 years ago. Her current shoulder pain began 2 weeks ago. She denies trauma to the shoulder. The pain is generalized over the shoulder and radiates into the upper arm. Movement of the upper extremity exacerbates her shoulder pain. She states that she is not fully able to raise her arm above her head. She denies numbness, weakness or tingling in the arm. She saw her orthopedic doctor 2 weeks ago for the same complaint. He performed x-rays in the office which were negative. He placed her in a sling and told her to follow up with him at the end of the month. She has not taken any PO pain medications for this pain. She denies changes of the shoulder pain since her last orthopedic visit.  She is also complaining of a painful mass to the left scapular region. She states a caretaker was washing her back when she noticed it earlier today. She complains of tenderness to palpation of the mass. She denies trauma to the shoulder or scapular region. States that she has never had a mass in this area before. Denies fevers, chills, nausea or vomiting. Denies drainage from the mass or redness of the skin over the mass.  Past Medical History  Diagnosis Date  . Arthritis   . Hypertension   . DDD (degenerative disc disease), lumbar   . DDD (degenerative disc disease), cervical   . HTN (hypertension)   . Gout   . Anxiety   . Constipation   . Hepatitis C     treated  . Pneumonia 12/02/13  . Heart murmur     probable bicuspid AV with mild AS, mild MR by 04/22/14 Echo (Dr. Montez Morita)    Past Surgical History  Procedure Laterality Date  . Abdominal hysterectomy    . Cesarean section      x 3  . Back surgery    . Appendectomy    . Inner ear surgery      blood vessel  . Tonsillectomy    . Colonoscopy w/ polypectomy    . Total shoulder arthroplasty Left 12/27/2014    Procedure: TOTAL SHOULDER ARTHROPLASTY;  Surgeon: Garald Balding, MD;  Location: [facility];  Service: Orthopedics;  Laterality: Left;   Family History  Problem Relation Age of Onset  . Heart disease Mother   . Kidney disease Mother   . Alcohol abuse Mother    Social History  Substance Use Topics  . Smoking status: Former Smoker -- 57 years  . Smokeless tobacco: Never Used     Comment: quit 2007  . Alcohol Use: No   OB History    No data available     Review of Systems  All other systems reviewed and are negative.     Allergies  Penicillins  Home Medications   Prior to Admission medications   Medication Sig Start Date End Date Taking? Authorizing Provider  amLODipine (NORVASC) 5 MG tablet Take 1 tablet (5 mg total) by mouth daily. 05/29/12   Oswald Hillock, MD  colchicine 0.6 MG tablet Take 0.6 mg by mouth daily as needed (gout).     Historical Provider, MD  docusate sodium (COLACE) 100 MG capsule Take 100 mg by mouth daily as needed for mild constipation.     Historical Provider, MD  DULoxetine (CYMBALTA) 30 MG capsule Take 1 capsule (30 mg total) by mouth daily. Patient taking differently: Take 30 mg by mouth daily. TAKES AT BEDTIME TO HELP SLEEP IF NEEDED. 09/29/14 09/29/15  Charlcie Cradle, MD  lisinopril (PRINIVIL,ZESTRIL) 40 MG tablet Take 1 tablet (40 mg total) by mouth daily. 05/29/12   Oswald Hillock, MD  methocarbamol (ROBAXIN) 500 MG tablet Take 1 tablet (500 mg total) by mouth every 8 (eight) hours as needed for muscle spasms. Patient not taking: Reported on 03/28/2015 12/29/14   Mike Craze Petrarca, PA-C  naproxen (NAPROSYN) 500 MG tablet Take 1 tablet (500 mg total) by mouth 2 (two)  times daily. 12/12/15   Dashana Guizar, PA-C  oxyCODONE (OXY IR/ROXICODONE) 5 MG immediate release tablet Take 1-2 tablets (5-10 mg total) by mouth every 4 (four) hours as needed for moderate pain, severe pain or breakthrough pain. 12/29/14   Mike Craze Petrarca, PA-C   BP 147/66 mmHg  Pulse 64  Temp(Src) 98.1 F (36.7 C) (Oral)  Resp 14  Ht 5\' 1"  (1.549 m)  Wt 77.111 kg  BMI 32.14 kg/m2  SpO2 100% Physical Exam  Constitutional: She appears well-developed and well-nourished. No distress.  HENT:  Head: Normocephalic and atraumatic.  Right Ear: External ear normal.  Left Ear: External ear normal.  Eyes: Conjunctivae are normal. Right eye exhibits no discharge. Left eye exhibits no discharge. No scleral icterus.  Neck: Normal range of motion.  Cardiovascular: Normal rate and intact distal pulses.   Radial pulse palpable.  Pulmonary/Chest: Effort normal.  Musculoskeletal:       Left shoulder: She exhibits decreased range of motion and tenderness. She exhibits no swelling, no effusion, no crepitus, no deformity, normal pulse and normal strength.  Mild, generalized tenderness to palpation of the shoulder. No focal pain along the clavicle. Tenderness extends into the mid humeral shaft. No obvious swelling or deformity. Active range of motion is restricted at approximately 90 extension and abduction. Patient resists passive range of motion above this level. Internal and external rotation intact. Full range of motion at the left elbow, wrist and digits. No tenderness along these joints.  Neurological: She is alert. Coordination normal.  5/5 strength of the bilateral upper extremities. Sensation to light touch intact throughout.  Skin: Skin is warm and dry.  Approximately 6 cm soft mass noted over the left scapular region. Mass is immobile. No fluctuance or surrounding induration. No drainage. No overlying erythema or streaking.  Psychiatric: She has a normal mood and affect. Her behavior is normal.   Nursing note and vitals reviewed.   ED Course  Procedures (including critical care time) Labs Review Labs Reviewed - No data to display  Imaging Review Dg Shoulder Left  12/12/2015  CLINICAL DATA:  Posterior LEFT shoulder pain beginning last night, not protruding from LEFT shoulder blade region EXAM: LEFT SHOULDER - 2+ VIEW COMPARISON:  08/20/2014 FINDINGS: Osseous demineralization. LEFT shoulder prosthesis. K-wires are present at the LEFT scapula with the longest wire extending beyond the scapular margin. AC joint alignment normal. No acute fracture, dislocation, or bone destruction. Visualized LEFT ribs intact. IMPRESSION: Post LEFT shoulder arthroplasty. Osseous demineralization without definite acute bony abnormalities. The longest K-wire extends beyond the scapular margin. Electronically Signed  By: Lavonia Dana M.D.   On: 12/12/2015 17:31   I have personally reviewed and evaluated these images and lab results as part of my medical decision-making.   EKG Interpretation None      MDM   Final diagnoses:  Left shoulder pain  Soft tissue mass   70 year old female presenting with left shoulder pain 2 weeks and soft shoulder mass noted today. Afebrile and nontoxic appearing. Left upper extremities neurovascularly intact. Restricted range of motion at the shoulder secondary to pain. No obvious swelling or deformity. There is a soft, immobile mass in her left scapular region. No overlying erythema, induration, drainage or warmth. X-ray of the left shoulder negative for acute injury. Ultrasound performed by Dr. Laverta Baltimore does not suggest infection. Patient has had intermittent left shoulder pain since arthroplasty performed by her orthopedic doctor. She is being followed by her orthopedist for same pain complaint. Encouraged patient to keep her follow-up appointment or move it sooner. Advised patient to have her orthopedic doctor evaluate her shoulder mass as well. This does not appear to be an  acute emergency and does not need antibiotics at this time. Will discharge with NSAIDs. Return precautions given in discharge paperwork and discussed with pt at bedside. Pt stable for discharge     Josephina Gip, PA-C 12/12/15 2143  Margette Fast, MD 12/12/15 2312

## 2015-12-12 NOTE — Discharge Instructions (Signed)
Shoulder Pain  The shoulder is the joint that connects your arms to your body. The bones that form the shoulder joint include the upper arm bone (humerus), the shoulder blade (scapula), and the collarbone (clavicle). The top of the humerus is shaped like a ball and fits into a rather flat socket on the scapula (glenoid cavity). A combination of muscles and strong, fibrous tissues that connect muscles to bones (tendons) support your shoulder joint and hold the ball in the socket. Small, fluid-filled sacs (bursae) are located in different areas of the joint. They act as cushions between the bones and the overlying soft tissues and help reduce friction between the gliding tendons and the bone as you move your arm. Your shoulder joint allows a wide range of motion in your arm. This range of motion allows you to do things like scratch your back or throw a ball. However, this range of motion also makes your shoulder more prone to pain from overuse and injury.  Causes of shoulder pain can originate from both injury and overuse and usually can be grouped in the following four categories:  1. Redness, swelling, and pain (inflammation) of the tendon (tendinitis) or the bursae (bursitis).  2. Instability, such as a dislocation of the joint.  3. Inflammation of the joint (arthritis).  4. Broken bone (fracture).  HOME CARE INSTRUCTIONS   1. Apply ice to the sore area.  ¨ Put ice in a plastic bag.  ¨ Place a towel between your skin and the bag.  ¨ Leave the ice on for 15-20 minutes, 3-4 times per day for the first 2 days, or as directed by your health care provider.  2. Stop using cold packs if they do not help with the pain.  3. If you have a shoulder sling or immobilizer, wear it as long as your caregiver instructs. Only remove it to shower or bathe. Move your arm as little as possible, but keep your hand moving to prevent swelling.  4. Squeeze a soft ball or foam pad as much as possible to help prevent swelling.  5. Only take  over-the-counter or prescription medicines for pain, discomfort, or fever as directed by your caregiver.  SEEK MEDICAL CARE IF:   1. Your shoulder pain increases, or new pain develops in your arm, hand, or fingers.  2. Your hand or fingers become cold and numb.  3. Your pain is not relieved with medicines.  SEEK IMMEDIATE MEDICAL CARE IF:   1. Your arm, hand, or fingers are numb or tingling.  2. Your arm, hand, or fingers are significantly swollen or turn white or blue.  MAKE SURE YOU:   1. Understand these instructions.  2. Will watch your condition.  3. Will get help right away if you are not doing well or get worse.     This information is not intended to replace advice given to you by your health care provider. Make sure you discuss any questions you have with your health care provider.     Document Released: 02/27/2005 Document Revised: 06/10/2014 Document Reviewed: 09/12/2014  Elsevier Interactive Patient Education ©2016 [employer]  Shoulder Range of Motion Exercises  Shoulder range of motion (ROM) exercises are designed to keep the shoulder moving freely. They are often recommended for people who have shoulder pain.  MOVEMENT EXERCISE  When you are able, do this exercise 5-6 days per week, or as told by your health care provider. Work toward doing 2 sets of 10 swings.  Pendulum   Exercise  How To Do This Exercise Lying Down  5. Lie face-down on a bed with your abdomen close to the side of the bed.  6. Let your arm hang over the side of the bed.  7. Relax your shoulder, arm, and hand.  8. Slowly and gently swing your arm forward and back. Do not use your neck muscles to swing your arm. They should be relaxed. If you are struggling to swing your arm, have someone gently swing it for you. When you do this exercise for the first time, swing your arm at a 15 degree angle for 15 seconds, or swing your arm 10 times. As pain lessens over time, increase the angle of the swing to 30-45 degrees.  9. Repeat steps 1-4  with the other arm.  How To Do This Exercise While Standing  6. Stand next to a sturdy chair or table and hold on to it with your hand.  ¨ Bend forward at the waist.  ¨ Bend your knees slightly.  ¨ Relax your other arm and let it hang limp.  ¨ Relax the shoulder blade of the arm that is hanging and let it drop.  ¨ While keeping your shoulder relaxed, use body motion to swing your arm in small circles. The first time you do this exercise, swing your arm for about 30 seconds or 10 times. When you do it next time, swing your arm for a little longer.  ¨ Stand up tall and relax.  ¨ Repeat steps 1-7, this time changing the direction of the circles.  7. Repeat steps 1-8 with the other arm.  STRETCHING EXERCISES  Do these exercises 3-4 times per day on 5-6 days per week or as told by your health care provider. Work toward holding the stretch for 20 seconds.  Stretching Exercise 1  4. Lift your arm straight out in front of you.  5. Bend your arm 90 degrees at the elbow (right angle) so your forearm goes across your body and looks like the letter "L."  6. Use your other arm to gently pull the elbow forward and across your body.  7. Repeat steps 1-3 with the other arm.  Stretching Exercise 2  You will need a towel or rope for this exercise.  3. Bend one arm behind your back with the palm facing outward.  4. Hold a towel with your other hand.  5. Reach the arm that holds the towel above your head, and bend that arm at the elbow. Your wrist should be behind your neck.  6. Use your free hand to grab the free end of the towel.  7. With the higher hand, gently pull the towel up behind you.  8. With the lower hand, pull the towel down behind you.  9. Repeat steps 1-6 with the other arm.  STRENGTHENING EXERCISES  Do each of these exercises at four different times of day (sessions) every day or as told by your health care provider. To begin with, repeat each exercise 5 times (repetitions). Work toward doing 3 sets of 12 repetitions or  as told by your health care provider.  Strengthening Exercise 1  You will need a light weight for this activity. As you grow stronger, you may use a heavier weight.  4. Standing with a weight in your hand, lift your arm straight out to the side until it is at the same height as your shoulder.  5. Bend your arm at 90 degrees so that your   fingers are pointing to the ceiling.  6. Slowly raise your hand until your arm is straight up in the air.  7. Repeat steps 1-3 with the other arm.  Strengthening Exercise 2  You will need a light weight for this activity. As you grow stronger, you may use a heavier weight.  1. Standing with a weight in your hand, gradually move your straight arm in an arc, starting at your side, then out in front of you, then straight up over your head.  2. Gradually move your other arm in an arc, starting at your side, then out in front of you, then straight up over your head.  3. Repeat steps 1-2 with the other arm.  Strengthening Exercise 3  You will need an elastic band for this activity. As you grow stronger, gradually increase the size of the bands or increase the number of bands that you use at one time.  1. While standing, hold an elastic band in one hand and raise that arm up in the air.  2. With your other hand, pull down the band until that hand is by your side.  3. Repeat steps 1-2 with the other arm.     This information is not intended to replace advice given to you by your health care provider. Make sure you discuss any questions you have with your health care provider.     Document Released: 02/16/2003 Document Revised: 10/04/2014 Document Reviewed: 05/16/2014  Elsevier Interactive Patient Education ©2016 Elsevier Inc[employer]

## 2015-12-12 NOTE — ED Notes (Addendum)
Patient had left shoulder arthroplasty in July 2016 and today noticed that she had a "knot" on the back of her left scapula.  Prior to noting the place on her scapula, patient had left arm and shoulder pain.  Patient denies fever and N/V.  Patient is able to move LUE, but not without pain.  Patient has an appointment with orthopedist on 7/30 of this year.

## 2015-12-19 NOTE — H&P (Signed)
Ashley Fears, MD   Biagio Borg, PA-C [address] [place], [place]  [address]                             [phone]   ORTHOPAEDIC HISTORY & PHYSICAL  Ashley Riddle MRN:  [id number] DOB/SEX:  12/22/1945/female  CHIEF COMPLAINT:  Painful left shouilder  HISTORY: Ashley Riddle is a very pleasant 70 year old African American female who was seen emergently on 12/13/2015 for pain and discomfort in the scapular area.  She has had a previous left total shoulder replacement.  At the time, she did have a fracture of the glenoid and underwent an open reduction internal fixation with K-wire fixation.  She had been doing well; however, over the past week she started having pain and discomfort in the posterior aspect of the shoulder at the scapular area.  However, she started having some swelling the size of part of a tennis ball, smaller than that but greater than a golf ball, over the scapula, and it has become extremely painful to her.  She put herself into a shoulder immobilizer and has been suffering significantly.  It was quite tender so it was aspirated and there was just a little bit of blood.  We sent it to the laboratory and it just appears to be blood without evidence of infection.  The cultures were negative.  There were very few white cells and no organisms seen by Gram stain.    Ashley Riddle is just about a year status post primary left total shoulder replacement.  She had a fracture of the glenoid at the time of the insertion and  fixed this with K-wires.  It looks like one of the K-wires, which is long, has gradually migrated through the glenoid and it is now protruding out the scapula.  It cannot feel it, but is suspected  that is the real cause of her problem based on her x-rays.    PAST MEDICAL HISTORY: Patient Active Problem List   Diagnosis Date Noted  . Fracture of glenoid process of left scapula 12/29/2014  . Status post total shoulder arthroplasty 12/27/2014  .  Cocaine dependence in remission ([place]) 09/29/2014  . Severe heroin dependence in sustained remission ([place]) 09/29/2014  . Opiate abuse, episodic 09/29/2014  . Mild tetrahydrocannabinol (THC) abuse 09/29/2014  . Adjustment disorder with depressed mood 09/29/2014  . Chronic pain syndrome 04/18/2014  . Primary osteoarthritis of left shoulder 04/18/2014  . Primary osteoarthritis of right knee 04/18/2014  . Primary osteoarthritis of left knee 04/18/2014  . ANXIETY 03/22/2010  . HYPERTENSION, BENIGN ESSENTIAL 03/22/2010  . ASTHMA 03/22/2010  . CONSTIPATION 03/22/2010  . HEPATITIS C 06/04/1995   Past Medical History  Diagnosis Date  . Arthritis   . Hypertension   . DDD (degenerative disc disease), lumbar   . DDD (degenerative disc disease), cervical   . HTN (hypertension)   . Gout   . Anxiety   . Constipation   . Hepatitis C     treated  . Pneumonia 12/02/13  . Heart murmur     probable bicuspid AV with mild AS, mild MR by 04/22/14 Echo (Dr. Montez Morita)   Past Surgical History  Procedure Laterality Date  . Abdominal hysterectomy    . Cesarean section      x 3  . Back surgery    . Appendectomy    . Inner ear surgery      blood vessel  .  Tonsillectomy    . Colonoscopy w/ polypectomy    . Total shoulder arthroplasty Left 12/27/2014    Procedure: TOTAL SHOULDER ARTHROPLASTY;  Surgeon: Garald Balding, MD;  Location: [facility];  Service: Orthopedics;  Laterality: Left;     MEDICATIONS:   No prescriptions prior to admission    ALLERGIES:   Allergies  Allergen Reactions  . Penicillins Hives    Has patient had a PCN reaction causing immediate rash, facial/tongue/throat swelling, SOB or lightheadedness with hypotension: Yes Has patient had a PCN reaction causing severe rash involving mucus membranes or skin necrosis: No Has patient had a PCN reaction that required hospitalization No Has patient had a PCN reaction occurring within the last 10 years: No If all of the above answers are  "NO", then may proceed with Cephalosporin use.     REVIEW OF SYSTEMS:  A comprehensive review of systems was negative except for: Respiratory: positive for chronic bronchitis Cardiovascular: positive for hypertension and murmur Gastrointestinal: positive for Hepatitis C   FAMILY HISTORY:   Family History  Problem Relation Age of Onset  . Heart disease Mother   . Kidney disease Mother   . Alcohol abuse Mother     SOCIAL HISTORY:   Social History  Substance Use Topics  . Smoking status: Former Smoker -- 1 years  . Smokeless tobacco: Never Used     Comment: quit 2007  . Alcohol Use: No      EXAMINATION: Vital signs in last 24 hours:    Head is normocephalic.   Eyes:  Pupils equal, round and reactive to light and accommodation.  Extraocular intact. ENT: Ears, nose, and throat were benign.   Neck: supple, no bruits were noted.   Chest: good expansion.   Lungs: essentially clear.   Cardiac: regular rhythm and rate, normal S1, S2.  .  She does have a grade 2/6 to 3/6 systolic murmur left sternal border Pulses :  2+ bilateral and symmetric in upper extremities. Abdomen is scaphoid, soft, nontender, no masses palpable, normal bowel sounds present. CNS:  He is oriented x3 and cranial nerves II-XII grossly intact. Breast, rectal, and genital exams: not performed and not indicated for an orthopedic evaluation. Musculoskeletal: There is swelling over the midportion of the scapula, definitely below the scapular spine over the infraspinatus area.  This feels fluid and also is extremely painful to palpation.  She resists any motion of her shoulder at this time.   ASSESSMENT: left shoulder painful retained K-Wire  Past Medical History  Diagnosis Date  . Arthritis   . Hypertension   . DDD (degenerative disc disease), lumbar   . DDD (degenerative disc disease), cervical   . HTN (hypertension)   . Gout   . Anxiety   . Constipation   . Hepatitis C     treated  . Pneumonia  12/02/13  . Heart murmur     probable bicuspid AV with mild AS, mild MR by 04/22/14 Echo (Dr. Montez Morita)    PLAN: Plan for left K-wire removal  The procedure,  risks, and benefits of surgery were presented and reviewed. The risks including but not limited to infection, blood clots, vascular and nerve injury, stiffness,  among others were discussed. The patient acknowledged the explanation, agreed to proceed.   Mike Craze [place], [place] [phone]  12/21/2015 1:51 PM

## 2015-12-21 NOTE — Pre-Procedure Instructions (Signed)
      Your procedure is scheduled on Tuesday, July 25.  Report to [facility] Admitting at 11:00 A.M.   Call this number if you have problems the morning of surgery:[phone]    Remember:  Do not eat food or drink liquids after midnight Monday, July 24  Take these medicines the morning of surgery with A SIP OF WATER:amLODipine ([place]), DULoxetine (CYMBALTA).               May take if needed: Tramadol ([place]).               STOP taking : Vitamins Naproxen (Aleve); DO Not take Aspirin, Aspirin Products, Ibuprofen (Advil), Herbal Medications.   Do not wear jewelry, make-up or nail polish.  Do not wear lotions, powders, or perfumes.    Do not shave 48 hours prior to surgery.    Do not bring valuables to the hospital.  [facility] is not responsible for any belongings or valuables.  Contacts, dentures or bridgework may not be worn into surgery.  Leave your suitcase in the car.  After surgery it may be brought to your room.  For patients admitted to the hospital, discharge time will be determined by your treatment team.  Patients discharged the day of surgery will not be allowed to drive home.   Special instructions:  Review  [facility] - Preparing For Surgery.  Please read over the following fac t sheets that you were given: Review  [facility] - Preparing For Surgery, Coughing and Deep Breathing.

## 2015-12-22 ENCOUNTER — Encounter (HOSPITAL_COMMUNITY)
Admission: RE | Admit: 2015-12-22 | Discharge: 2015-12-22 | Disposition: A | Discharge status: Home / Self Care | Payer: Medicare HMO | Source: Ambulatory Visit | Attending: Orthopaedic Surgery | Admitting: Orthopaedic Surgery

## 2015-12-22 ENCOUNTER — Encounter (HOSPITAL_COMMUNITY): Payer: Self-pay

## 2015-12-22 DIAGNOSIS — Z8619 Personal history of other infectious and parasitic diseases: Secondary | ICD-10-CM | POA: Diagnosis not present

## 2015-12-22 DIAGNOSIS — Z87891 Personal history of nicotine dependence: Secondary | ICD-10-CM | POA: Insufficient documentation

## 2015-12-22 DIAGNOSIS — Z01812 Encounter for preprocedural laboratory examination: Secondary | ICD-10-CM | POA: Insufficient documentation

## 2015-12-22 DIAGNOSIS — Z01818 Encounter for other preprocedural examination: Secondary | ICD-10-CM | POA: Insufficient documentation

## 2015-12-22 DIAGNOSIS — M1990 Unspecified osteoarthritis, unspecified site: Secondary | ICD-10-CM | POA: Diagnosis not present

## 2015-12-22 DIAGNOSIS — Z96612 Presence of left artificial shoulder joint: Secondary | ICD-10-CM | POA: Diagnosis not present

## 2015-12-22 DIAGNOSIS — I10 Essential (primary) hypertension: Secondary | ICD-10-CM | POA: Insufficient documentation

## 2015-12-22 HISTORY — DX: Personal history of other medical treatment: Z92.89

## 2015-12-22 LAB — BASIC METABOLIC PANEL
Anion gap: 6 (ref 5–15)
BUN: <5 mg/dL — ABNORMAL LOW (ref 6–20)
CHLORIDE: 100 mmol/L — AB (ref 101–111)
CO2: 32 mmol/L (ref 22–32)
CREATININE: 0.57 mg/dL (ref 0.44–1.00)
Calcium: 10.1 mg/dL (ref 8.9–10.3)
GFR calc non Af Amer: >60 mL/min (ref >60)
Glucose, Bld: 92 mg/dL (ref 65–99)
Potassium: 4.2 mmol/L (ref 3.5–5.1)
SODIUM: 138 mmol/L (ref 135–145)

## 2015-12-22 LAB — CBC
HEMATOCRIT: 34.2 % — AB (ref 36.0–46.0)
HEMOGLOBIN: 11.2 g/dL — AB (ref 12.0–15.0)
MCH: 27.9 pg (ref 26.0–34.0)
MCHC: 32.7 g/dL (ref 30.0–36.0)
MCV: 85.3 fL (ref 78.0–100.0)
Platelets: 153 10*3/uL (ref 150–400)
RBC: 4.01 MIL/uL (ref 3.87–5.11)
RDW: 12.2 % (ref 11.5–15.5)
WBC: 5.1 10*3/uL (ref 4.0–10.5)

## 2015-12-22 NOTE — Progress Notes (Signed)
Ashley Riddle has history of Hepatitis C, was treated last year and states liver have been normal, except Dr Montez Morita drew labs in June and called her to tell the ammonia is elevated and she picked up a copy of labs and is taking to her liver specialist that is on from [facility] - on [address].

## 2015-12-25 NOTE — Progress Notes (Signed)
Anesthesia Chart Review: Patient is a 70 year old female posted for removal of K-wire, left scapula on 12/26/15 by Dr. Durward Fortes. She is s/p left total shoulder 12/27/14.  History includes former smoker, murmur (probable bicuspid AV with mild AS, mild MR by 04/2014 echo), HTN, hepatitis C s/p treatment '16, anxiety, arthritis, hysterectomy, back surgery, tonsillectomy, appendectomy, left total shoulder '16. BMI is consistent with mild obesity.   PCP is listed as Dr. Wallene Huh, records pending. Reportedly she is also followed at [facility].  Last EKG currently available is from 08/11/14 and showed ST at 107 bpm, abnormal r wave progression-early transition, borderline prolonged QT interval, baseline wanderer in lead III. More recent EKG requested from Dr. Tonita Phoenix office--if not received then plan EKG on arrival the day of surgery.    04/22/14 Echo: Study Conclusions - Left ventricle: The cavity size was normal. Systolic function wasnormal. The estimated ejection fraction was in the range of 55% to 60%. Wall motion was normal; there were no regional wall motion abnormalities. Left ventricular diastolic function parameters were normal. - Aortic valve: Probably bicuspid. There was mild stenosis. Valve area (VTI): 1.89 cm^2. Valve area (Vmax): 1.61 cm^2. Valve area (Vmean): 1.98 cm^2. Mean gradient (S): 13 mm Hg. Peak gradient (S): 29 mm Hg. - Mitral valve: There was mild regurgitation. - Atrial septum: No defect or patent foramen ovale was identified.  01/20/14 Carotid duplex: Summary: Findings suggest 1-39% internal carotid artery stenosis. Verterbal arteries are patent with antegrade flow.   Preoperative labs noted. History of hepatitis C s/p treatment and followed at [facility]. Currently, no Liver Clinic records or recent HFP results seen. Will plan to get stat HFP on arrival.   If additional records received before the end of business day then I'll plan to review;  however only with mild AS by echo within the past two years. If she remains asymptomatic from a CV standpoint and EKG and HFP stable then I would anticipate that she could proceed as planned.  George Hugh [facility] Short Stay Center/Anesthesiology Phone [phone] 12/25/2015 10:35 AM

## 2015-12-26 ENCOUNTER — Encounter (HOSPITAL_COMMUNITY)
Admission: RE | Disposition: A | Discharge status: Home / Self Care | Payer: Self-pay | Source: Ambulatory Visit | Attending: Orthopaedic Surgery

## 2015-12-26 ENCOUNTER — Encounter (HOSPITAL_COMMUNITY): Payer: Self-pay | Admitting: *Deleted

## 2015-12-26 ENCOUNTER — Ambulatory Visit (HOSPITAL_COMMUNITY): Payer: Medicare HMO | Admitting: Vascular Surgery

## 2015-12-26 ENCOUNTER — Ambulatory Visit (HOSPITAL_COMMUNITY)
Admission: RE | Admit: 2015-12-26 | Discharge: 2015-12-26 | Disposition: A | Discharge status: Home / Self Care | Payer: Medicare HMO | Source: Ambulatory Visit | Attending: Orthopaedic Surgery | Admitting: Orthopaedic Surgery

## 2015-12-26 DIAGNOSIS — Z880 Allergy status to penicillin: Secondary | ICD-10-CM | POA: Diagnosis not present

## 2015-12-26 DIAGNOSIS — M170 Bilateral primary osteoarthritis of knee: Secondary | ICD-10-CM | POA: Insufficient documentation

## 2015-12-26 DIAGNOSIS — F419 Anxiety disorder, unspecified: Secondary | ICD-10-CM | POA: Insufficient documentation

## 2015-12-26 DIAGNOSIS — Z87891 Personal history of nicotine dependence: Secondary | ICD-10-CM | POA: Insufficient documentation

## 2015-12-26 DIAGNOSIS — T85898A Other specified complication of other internal prosthetic devices, implants and grafts, initial encounter: Secondary | ICD-10-CM | POA: Diagnosis present

## 2015-12-26 DIAGNOSIS — Z1810 Retained metal fragments, unspecified: Secondary | ICD-10-CM

## 2015-12-26 DIAGNOSIS — Z96612 Presence of left artificial shoulder joint: Secondary | ICD-10-CM | POA: Diagnosis not present

## 2015-12-26 DIAGNOSIS — Y838 Other surgical procedures as the cause of abnormal reaction of the patient, or of later complication, without mention of misadventure at the time of the procedure: Secondary | ICD-10-CM | POA: Insufficient documentation

## 2015-12-26 DIAGNOSIS — I10 Essential (primary) hypertension: Secondary | ICD-10-CM | POA: Diagnosis not present

## 2015-12-26 DIAGNOSIS — J45909 Unspecified asthma, uncomplicated: Secondary | ICD-10-CM | POA: Insufficient documentation

## 2015-12-26 DIAGNOSIS — T84298A Other mechanical complication of internal fixation device of other bones, initial encounter: Secondary | ICD-10-CM | POA: Diagnosis not present

## 2015-12-26 HISTORY — PX: HARDWARE REMOVAL: SHX979

## 2015-12-26 LAB — HEPATIC FUNCTION PANEL
ALT: 17 U/L (ref 14–54)
AST: 25 U/L (ref 15–41)
Albumin: 3.6 g/dL (ref 3.5–5.0)
Alkaline Phosphatase: 84 U/L (ref 38–126)
BILIRUBIN INDIRECT: 0.2 mg/dL — AB (ref 0.3–0.9)
Bilirubin, Direct: 0.1 mg/dL (ref 0.1–0.5)
TOTAL PROTEIN: 6.7 g/dL (ref 6.5–8.1)
Total Bilirubin: 0.3 mg/dL (ref 0.3–1.2)

## 2015-12-26 SURGERY — REMOVAL, HARDWARE
Anesthesia: General | Site: Shoulder | Laterality: Left

## 2015-12-26 MED ORDER — PHENYLEPHRINE 40 MCG/ML (10ML) SYRINGE FOR IV PUSH (FOR BLOOD PRESSURE SUPPORT)
PREFILLED_SYRINGE | INTRAVENOUS | Status: AC
  Filled 2015-12-26: qty 10

## 2015-12-26 MED ORDER — MIDAZOLAM HCL 2 MG/2ML IJ SOLN
INTRAMUSCULAR | Status: AC
  Filled 2015-12-26: qty 2

## 2015-12-26 MED ORDER — VANCOMYCIN HCL IN DEXTROSE 1-5 GM/200ML-% IV SOLN
1000.0000 mg | Freq: Two times a day (BID) | INTRAVENOUS | Status: DC
  Administered 2015-12-26: 1000 mg via INTRAVENOUS
  Filled 2015-12-26: qty 200

## 2015-12-26 MED ORDER — LIDOCAINE 2% (20 MG/ML) 5 ML SYRINGE
INTRAMUSCULAR | Status: DC | PRN
  Administered 2015-12-26: 60 mg via INTRAVENOUS

## 2015-12-26 MED ORDER — FENTANYL CITRATE (PF) 100 MCG/2ML IJ SOLN
INTRAMUSCULAR | Status: AC
  Filled 2015-12-26: qty 2

## 2015-12-26 MED ORDER — DEXAMETHASONE SODIUM PHOSPHATE 10 MG/ML IJ SOLN
INTRAMUSCULAR | Status: DC | PRN
  Administered 2015-12-26: 5 mg via INTRAVENOUS

## 2015-12-26 MED ORDER — SODIUM CHLORIDE 0.9 % IV SOLN: 75.0000 mL/h | INTRAVENOUS | Status: DC

## 2015-12-26 MED ORDER — DEXAMETHASONE SODIUM PHOSPHATE 10 MG/ML IJ SOLN
INTRAMUSCULAR | Status: AC
  Filled 2015-12-26: qty 1

## 2015-12-26 MED ORDER — MIDAZOLAM HCL 5 MG/5ML IJ SOLN
INTRAMUSCULAR | Status: DC | PRN
  Administered 2015-12-26: 2 mg via INTRAVENOUS

## 2015-12-26 MED ORDER — ONDANSETRON HCL 4 MG/2ML IJ SOLN
INTRAMUSCULAR | Status: DC | PRN
  Administered 2015-12-26: 4 mg via INTRAVENOUS

## 2015-12-26 MED ORDER — HYDROCODONE-ACETAMINOPHEN 5-325 MG PO TABS
ORAL_TABLET | ORAL | Status: AC
  Filled 2015-12-26: qty 2

## 2015-12-26 MED ORDER — HYDROCODONE-ACETAMINOPHEN 5-325 MG PO TABS
1.0000 | ORAL_TABLET | Freq: Once | ORAL | Status: AC
  Administered 2015-12-26: 2 via ORAL

## 2015-12-26 MED ORDER — FENTANYL CITRATE (PF) 100 MCG/2ML IJ SOLN
INTRAMUSCULAR | Status: AC
  Administered 2015-12-26: 50 ug via INTRAVENOUS
  Filled 2015-12-26: qty 2

## 2015-12-26 MED ORDER — ONDANSETRON HCL 4 MG/2ML IJ SOLN
INTRAMUSCULAR | Status: AC
  Filled 2015-12-26: qty 2

## 2015-12-26 MED ORDER — LIDOCAINE HCL (CARDIAC) 20 MG/ML IV SOLN: INTRAVENOUS | Status: DC | PRN

## 2015-12-26 MED ORDER — PROPOFOL 10 MG/ML IV BOLUS
INTRAVENOUS | Status: DC | PRN
  Administered 2015-12-26: 140 mg via INTRAVENOUS

## 2015-12-26 MED ORDER — LACTATED RINGERS IV SOLN
INTRAVENOUS | Status: DC
  Administered 2015-12-26 (×2): via INTRAVENOUS

## 2015-12-26 MED ORDER — FENTANYL CITRATE (PF) 100 MCG/2ML IJ SOLN
25.0000 ug | INTRAMUSCULAR | Status: DC | PRN
  Administered 2015-12-26 (×3): 50 ug via INTRAVENOUS

## 2015-12-26 MED ORDER — LIDOCAINE 2% (20 MG/ML) 5 ML SYRINGE
INTRAMUSCULAR | Status: AC
  Filled 2015-12-26: qty 5

## 2015-12-26 MED ORDER — ACETAMINOPHEN 10 MG/ML IV SOLN
INTRAVENOUS | Status: DC | PRN
  Administered 2015-12-26: 1000 mg via INTRAVENOUS

## 2015-12-26 MED ORDER — CHLORHEXIDINE GLUCONATE 4 % EX LIQD: 60.0000 mL | Freq: Once | CUTANEOUS | Status: DC

## 2015-12-26 MED ORDER — VANCOMYCIN HCL 1000 MG IV SOLR
1000.0000 mg | INTRAVENOUS | Status: DC
  Filled 2015-12-26 (×2): qty 1000

## 2015-12-26 MED ORDER — FENTANYL CITRATE (PF) 250 MCG/5ML IJ SOLN
INTRAMUSCULAR | Status: AC
  Filled 2015-12-26: qty 5

## 2015-12-26 MED ORDER — BUPIVACAINE-EPINEPHRINE (PF) 0.25% -1:200000 IJ SOLN
INTRAMUSCULAR | Status: AC
  Filled 2015-12-26: qty 30

## 2015-12-26 MED ORDER — 0.9 % SODIUM CHLORIDE (POUR BTL) OPTIME
TOPICAL | Status: DC | PRN
  Administered 2015-12-26: 1000 mL

## 2015-12-26 MED ORDER — HYDROCODONE-ACETAMINOPHEN 5-325 MG PO TABS: 1.0000 | ORAL_TABLET | ORAL | 0 refills | Status: DC | PRN

## 2015-12-26 MED ORDER — FENTANYL CITRATE (PF) 100 MCG/2ML IJ SOLN
INTRAMUSCULAR | Status: DC | PRN
  Administered 2015-12-26 (×5): 50 ug via INTRAVENOUS

## 2015-12-26 SURGICAL SUPPLY — 42 items
APL SKNCLS STERI-STRIP NONHPOA (GAUZE/BANDAGES/DRESSINGS) ×1
BENZOIN TINCTURE PRP APPL 2/3 (GAUZE/BANDAGES/DRESSINGS) ×2 IMPLANT
CLSR STERI-STRIP ANTIMIC 1/2X4 (GAUZE/BANDAGES/DRESSINGS) ×2 IMPLANT
COVER SURGICAL LIGHT HANDLE (MISCELLANEOUS) ×2 IMPLANT
DRAPE C-ARM 42X72 X-RAY (DRAPES) IMPLANT
DRAPE INCISE IOBAN 66X45 STRL (DRAPES) IMPLANT
DRAPE ORTHO SPLIT 77X108 STRL (DRAPES) ×4
DRAPE PROXIMA HALF (DRAPES) IMPLANT
DRAPE SURG ORHT 6 SPLT 77X108 (DRAPES) ×2 IMPLANT
DRSG EMULSION OIL 3X3 NADH (GAUZE/BANDAGES/DRESSINGS) ×2 IMPLANT
DRSG MEPILEX BORDER 4X4 (GAUZE/BANDAGES/DRESSINGS) ×2 IMPLANT
DRSG PAD ABDOMINAL 8X10 ST (GAUZE/BANDAGES/DRESSINGS) ×2 IMPLANT
ELECT REM PT RETURN 9FT ADLT (ELECTROSURGICAL) ×2
ELECTRODE REM PT RTRN 9FT ADLT (ELECTROSURGICAL) ×1 IMPLANT
GAUZE SPONGE 4X4 12PLY STRL (GAUZE/BANDAGES/DRESSINGS) ×2 IMPLANT
GLOVE BIOGEL M STRL SZ7.5 (GLOVE) ×2 IMPLANT
GLOVE BIOGEL PI IND STRL 8 (GLOVE) ×1 IMPLANT
GLOVE BIOGEL PI IND STRL 8.5 (GLOVE) ×1 IMPLANT
GLOVE BIOGEL PI INDICATOR 8 (GLOVE) ×1
GLOVE BIOGEL PI INDICATOR 8.5 (GLOVE) ×1
GLOVE ECLIPSE 8.0 STRL XLNG CF (GLOVE) ×2 IMPLANT
GLOVE SURG ORTHO 8.5 STRL (GLOVE) ×2 IMPLANT
GOWN STRL REIN 2XL LVL4 (GOWN DISPOSABLE) ×2 IMPLANT
GOWN STRL REUS W/ TWL LRG LVL3 (GOWN DISPOSABLE) ×3 IMPLANT
GOWN STRL REUS W/TWL LRG LVL3 (GOWN DISPOSABLE) ×6
KIT BASIN OR (CUSTOM PROCEDURE TRAY) ×2 IMPLANT
KIT ROOM TURNOVER OR (KITS) ×2 IMPLANT
MANIFOLD NEPTUNE II (INSTRUMENTS) ×2 IMPLANT
NS IRRIG 1000ML POUR BTL (IV SOLUTION) ×2 IMPLANT
PACK SHOULDER (CUSTOM PROCEDURE TRAY) ×2 IMPLANT
PAD ARMBOARD 7.5X6 YLW CONV (MISCELLANEOUS) ×4 IMPLANT
SPONGE GAUZE 4X4 12PLY STER LF (GAUZE/BANDAGES/DRESSINGS) ×2 IMPLANT
STAPLER VISISTAT 35W (STAPLE) ×2 IMPLANT
SUT ETHILON 4 0 FS 1 (SUTURE) IMPLANT
SUT VIC AB 0 CT1 27 (SUTURE)
SUT VIC AB 0 CT1 27XBRD ANBCTR (SUTURE) IMPLANT
SUT VIC AB 2-0 CT1 27 (SUTURE)
SUT VIC AB 2-0 CT1 TAPERPNT 27 (SUTURE) IMPLANT
TAPE CLOTH SURG 4X10 WHT LF (GAUZE/BANDAGES/DRESSINGS) ×2 IMPLANT
TOWEL OR 17X24 6PK STRL BLUE (TOWEL DISPOSABLE) ×2 IMPLANT
TOWEL OR 17X26 10 PK STRL BLUE (TOWEL DISPOSABLE) ×2 IMPLANT
WATER STERILE IRR 1000ML POUR (IV SOLUTION) ×2 IMPLANT

## 2015-12-26 NOTE — Anesthesia Procedure Notes (Signed)
Procedure Name: LMA Insertion Date/Time: 12/26/2015 1:54 PM Performed by: Freddie Breech Pre-anesthesia Checklist: Patient identified, Emergency Drugs available, Suction available, Patient being monitored and Timeout performed Patient Re-evaluated:Patient Re-evaluated prior to inductionOxygen Delivery Method: Circle System Utilized Preoxygenation: Pre-oxygenation with 100% oxygen Intubation Type: IV induction Ventilation: Mask ventilation without difficulty LMA: LMA inserted LMA Size: 4.0 Number of attempts: 1 Airway Equipment and Method: Patient positioned with wedge pillow Placement Confirmation: positive ETCO2,  CO2 detector and breath sounds checked- equal and bilateral Tube secured with: Tape Dental Injury: Teeth and Oropharynx as per pre-operative assessment

## 2015-12-26 NOTE — Transfer of Care (Signed)
Immediate Anesthesia Transfer of Care Note  Patient: Ashley Riddle  Procedure(s) Performed: Procedure(s): REMOVAL K-WIRE LEFT SCAPULA (Left)  Patient Location: PACU  Anesthesia Type:General  Level of Consciousness:  sedated, patient cooperative and responds to stimulation  Airway & Oxygen Therapy:Patient Spontanous Breathing and Patient connected to face mask oxgen  Post-op Assessment:  Report given to PACU RN and Post -op Vital signs reviewed and stable  Post vital signs:  Reviewed and stable  Last Vitals:  Vitals:   12/26/15 1113 12/26/15 1415  BP: 138/72   Pulse: 75   Resp: 18   Temp: 37 C [id number] C    Complications: No apparent anesthesia complications

## 2015-12-26 NOTE — Progress Notes (Signed)
Ortho tech called for blue arm sling

## 2015-12-26 NOTE — Anesthesia Preprocedure Evaluation (Addendum)
Anesthesia Evaluation  Patient identified by MRN, date of birth, ID band Patient awake    Reviewed: Allergy & Precautions, NPO status , Patient's Chart, lab work & pertinent test results  History of Anesthesia Complications Negative for: history of anesthetic complications  Airway Mallampati: II  TM Distance: >3 FB Neck ROM: Full    Dental  (+) Edentulous Upper, Dental Advisory Given   Pulmonary asthma , former smoker,    Pulmonary exam normal        Cardiovascular hypertension, Pt. on medications + Valvular Problems/Murmurs AS  Rhythm:Regular Rate:Normal + Systolic murmurs Study Conclusions  - Left ventricle: The cavity size was normal. Systolic function was normal. The estimated ejection fraction was in the range of 55% to 60%. Wall motion was normal; there were no regional wall motion abnormalities. Left ventricular diastolic function parameters were normal. - Aortic valve: Probably bicuspid. There was mild stenosis. Valve area (VTI): 1.89 cm^2. Valve area (Vmax): 1.61 cm^2. Valve area (Vmean): 1.98 cm^2.    Neuro/Psych PSYCHIATRIC DISORDERS Anxiety negative neurological ROS     GI/Hepatic negative GI ROS, (+) Hepatitis -, C  Endo/Other  negative endocrine ROS  Renal/GU negative Renal ROS     Musculoskeletal   Abdominal   Peds  Hematology   Anesthesia Other Findings Todays EKG.... NSR  Reproductive/Obstetrics                            Anesthesia Physical  Anesthesia Plan  ASA: III  Anesthesia Plan: General   Post-op Pain Management:    Induction: Intravenous  Airway Management Planned: LMA  Additional Equipment:   Intra-op Plan:   Post-operative Plan: Extubation in OR  Informed Consent: I have reviewed the patients History and Physical, chart, labs and discussed the procedure including the risks, benefits and alternatives for the proposed anesthesia with  the patient or authorized representative who has indicated his/her understanding and acceptance.   Dental advisory given  Plan Discussed with: CRNA, Surgeon and Anesthesiologist  Anesthesia Plan Comments:         Anesthesia Quick Evaluation

## 2015-12-26 NOTE — Anesthesia Postprocedure Evaluation (Signed)
Anesthesia Post Note  Patient: Ashley Riddle  Procedure(s) Performed: Procedure(s) (LRB): REMOVAL K-WIRE LEFT SCAPULA (Left)  Patient location during evaluation: PACU Anesthesia Type: General Level of consciousness: awake and alert Pain management: pain level controlled Vital Signs Assessment: post-procedure vital signs reviewed and stable Respiratory status: spontaneous breathing, nonlabored ventilation, respiratory function stable and patient connected to nasal cannula oxygen Cardiovascular status: blood pressure returned to baseline and stable Postop Assessment: no signs of nausea or vomiting Anesthetic complications: no    Last Vitals:  Vitals:   12/26/15 1515 12/26/15 1538  BP: 133/75 (!) 154/85  Pulse: (!) 59 71  Resp: 13 16  Temp:      Last Pain:  Vitals:   12/26/15 1500  TempSrc:   PainSc: 6                  Simaya Lumadue EDWARD

## 2015-12-26 NOTE — Progress Notes (Signed)
Orthopedic Tech Progress Note Patient Details:  Ashley Riddle 03-Apr-1946 [id number]  Ortho Devices Type of Ortho Device: Arm sling Ortho Device/Splint Interventions: Application   [name] 12/26/2015, 4:54 PM

## 2015-12-26 NOTE — Op Note (Signed)
PATIENT ID:      Ashley Riddle  MRN:     [id number] DOB/AGE:    1945/07/10 / 70 y.o.       OPERATIVE REPORT    DATE OF PROCEDURE:  12/26/2015       PREOPERATIVE DIAGNOSIS:   RETAINED, PROTRUDING K-WIRE LEFT SCAPULA                                                       Estimated body mass index is 30.38 kg/m as calculated from the following:   Height as of this encounter: 5\' 3"  (1.6 m).   Weight as of this encounter: 77.8 kg (171 lb 8 oz).     POSTOPERATIVE DIAGNOSIS:   RETAINED, PROTRUDING K-WIRE LEFT SCAPULA                                                                     Estimated body mass index is 30.38 kg/m as calculated from the following:   Height as of this encounter: 5\' 3"  (1.6 m).   Weight as of this encounter: 77.8 kg (171 lb 8 oz).     PROCEDURE:  Procedure(s): REMOVAL K-WIRE LEFT SCAPULA     SURGEON:  Joni Fears, MD    ASSISTANT:   Biagio Borg, PA-C   (Present and scrubbed throughout the case, critical for assistance with exposure, retraction, instrumentation, and closure.)          ANESTHESIA: general     DRAINS: Penrose drain in the left scapula wound :      TOURNIQUET TIME: * No tourniquets in log *    COMPLICATIONS:  None   CONDITION:  stable  PROCEDURE IN DETAIL: [place], [name] 12/26/2015, 1:54 PM

## 2015-12-27 ENCOUNTER — Encounter (HOSPITAL_COMMUNITY): Payer: Self-pay | Admitting: Orthopaedic Surgery

## 2015-12-27 NOTE — Op Note (Signed)
[name], Ashley Riddle             ACCOUNT NO.:  [id number]  MEDICAL RECORD NO.:  [id number]  LOCATION:  [facility]                         FACILITY:  [facility]  PHYSICIAN:  Vonna Kotyk. Selby Foisy, M.D.DATE OF BIRTH:  05-13-1946  DATE OF PROCEDURE:  12/26/2015 DATE OF DISCHARGE:  12/26/2015                              OPERATIVE REPORT   PREOPERATIVE DIAGNOSIS:  Symptomatic, retained, protruding K-wire, left scapula.  POSTOPERATIVE DIAGNOSIS:  Symptomatic, retained, protruding K-wire, left scapula.  PROCEDURE:  Removal of K-wire, left scapula.  SURGEON:  Vonna Kotyk. Durward Fortes, M.D.  ASSISTANT:  Aaron Edelman D. Petrarca, P.A.-C.  ANESTHESIA:  General.  COMPLICATIONS:  None.  HISTORY:  A 70 year old female, who is over a year status post left total shoulder replacement.  She had a small fracture of the glenoid that was repaired with K-wires.  She has done very well over the past year, but only within the last several weeks, has developed a hematoma along the posterior aspect of left scapula.  Aspiration was negative for infection, but the K-wire has slowly protruded through this scapula and now was exiting posteriorly.  It was completely covered, but symptomatic, now is to have removal.  DESCRIPTION OF PROCEDURE:  [name] was met with the family in the holding area, identified the left shoulder as appropriate operative site and marked it accordingly.  She was then transported to room #7 and placed under general anesthesia without difficulty.  She was placed in the lateral decubitus position with the left side up and secured to the operating room table with the Innomed hip system.  The left scapula was then prepped with DuraPrep x2.  Sterile draping was performed.  Prior to the prep, we did localize the tip by image intensification and then marked the skin over the area of the hematoma.  There was abundant inch incision and we infiltrated with 0.25% Marcaine with epinephrine using a 15-blade  knife.  Made a longitudinal incision, run through the superficial and the deeper fascia and then evacuated the hematoma and obtained both aerobic and anaerobic cultures.  The tip of the K-wire was palpable.  We carefully applied a needle holder over the tip, which we could visualize and then removed it without any further difficulty.  Wound was irrigated with saline solution.  I did insert a 0.25-inch Penrose drain into the hematoma that had been evacuated.  Infiltrated with 0.25% Marcaine with epinephrine and then closed the wound in several layers with Vicryl and then 4-0 Ethilon.  Sterile bulky dressing was applied.  The patient then awoke and returned to the postanesthesia recovery room in satisfactory condition.  She was given a gram of vancomycin.  PLAN:  Percocet for pain.  Office 2 days to remove the Penrose drain. She will use a sling.     Vonna Kotyk. Durward Fortes, M.D.     PWW/MEDQ  D:  12/26/2015  T:  12/27/2015  Job:  [id number]

## 2015-12-31 LAB — AEROBIC/ANAEROBIC CULTURE (SURGICAL/DEEP WOUND): CULTURE: NO GROWTH

## 2015-12-31 LAB — AEROBIC/ANAEROBIC CULTURE W GRAM STAIN (SURGICAL/DEEP WOUND): Culture: NO GROWTH

## 2016-01-20 ENCOUNTER — Encounter (INDEPENDENT_AMBULATORY_CARE_PROVIDER_SITE_OTHER): Payer: Self-pay

## 2016-01-24 ENCOUNTER — Emergency Department (HOSPITAL_COMMUNITY): Payer: Medicare HMO

## 2016-01-24 ENCOUNTER — Encounter (HOSPITAL_COMMUNITY): Payer: Self-pay | Admitting: Emergency Medicine

## 2016-01-24 ENCOUNTER — Emergency Department (HOSPITAL_COMMUNITY)
Admission: EM | Admit: 2016-01-24 | Discharge: 2016-01-24 | Disposition: A | Discharge status: Home / Self Care | Payer: Medicare HMO | Attending: Emergency Medicine | Admitting: Emergency Medicine

## 2016-01-24 DIAGNOSIS — M5489 Other dorsalgia: Secondary | ICD-10-CM

## 2016-01-24 DIAGNOSIS — M25512 Pain in left shoulder: Secondary | ICD-10-CM

## 2016-01-24 DIAGNOSIS — M542 Cervicalgia: Secondary | ICD-10-CM | POA: Diagnosis present

## 2016-01-24 DIAGNOSIS — M549 Dorsalgia, unspecified: Secondary | ICD-10-CM | POA: Insufficient documentation

## 2016-01-24 DIAGNOSIS — Z79899 Other long term (current) drug therapy: Secondary | ICD-10-CM | POA: Diagnosis not present

## 2016-01-24 DIAGNOSIS — M545 Low back pain: Secondary | ICD-10-CM | POA: Insufficient documentation

## 2016-01-24 DIAGNOSIS — Z87891 Personal history of nicotine dependence: Secondary | ICD-10-CM | POA: Insufficient documentation

## 2016-01-24 DIAGNOSIS — R51 Headache: Secondary | ICD-10-CM | POA: Insufficient documentation

## 2016-01-24 DIAGNOSIS — Y92410 Unspecified street and highway as the place of occurrence of the external cause: Secondary | ICD-10-CM | POA: Insufficient documentation

## 2016-01-24 DIAGNOSIS — I10 Essential (primary) hypertension: Secondary | ICD-10-CM | POA: Diagnosis not present

## 2016-01-24 DIAGNOSIS — Y999 Unspecified external cause status: Secondary | ICD-10-CM | POA: Insufficient documentation

## 2016-01-24 DIAGNOSIS — M546 Pain in thoracic spine: Secondary | ICD-10-CM | POA: Insufficient documentation

## 2016-01-24 DIAGNOSIS — J45909 Unspecified asthma, uncomplicated: Secondary | ICD-10-CM | POA: Diagnosis not present

## 2016-01-24 DIAGNOSIS — Y9389 Activity, other specified: Secondary | ICD-10-CM | POA: Diagnosis not present

## 2016-01-24 DIAGNOSIS — R1032 Left lower quadrant pain: Secondary | ICD-10-CM | POA: Diagnosis not present

## 2016-01-24 LAB — I-STAT CHEM 8, ED
BUN: 6 mg/dL (ref 6–20)
Calcium, Ion: 1.3 mmol/L — ABNORMAL HIGH (ref 1.12–1.23)
Chloride: 101 mmol/L (ref 101–111)
Creatinine, Ser: 0.6 mg/dL (ref 0.44–1.00)
Glucose, Bld: 116 mg/dL — ABNORMAL HIGH (ref 65–99)
HCT: 42 % (ref 36.0–46.0)
Hemoglobin: 14.3 g/dL (ref 12.0–15.0)
Potassium: 3.8 mmol/L (ref 3.5–5.1)
Sodium: 140 mmol/L (ref 135–145)
TCO2: 30 mmol/L (ref 0–100)

## 2016-01-24 MED ORDER — MORPHINE SULFATE (PF) 4 MG/ML IV SOLN
4.0000 mg | Freq: Once | INTRAVENOUS | Status: AC
  Administered 2016-01-24: 4 mg via INTRAVENOUS
  Filled 2016-01-24: qty 1

## 2016-01-24 MED ORDER — IOPAMIDOL (ISOVUE-300) INJECTION 61%
100.0000 mL | Freq: Once | INTRAVENOUS | Status: AC | PRN
  Administered 2016-01-24: 100 mL via INTRAVENOUS

## 2016-01-24 MED ORDER — HYDROCODONE-ACETAMINOPHEN 5-325 MG PO TABS: 1.0000 | ORAL_TABLET | ORAL | 0 refills | Status: DC | PRN

## 2016-01-24 MED ORDER — ORPHENADRINE CITRATE ER 100 MG PO TB12: 100.0000 mg | ORAL_TABLET | Freq: Two times a day (BID) | ORAL | 0 refills | Status: DC | PRN
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
## 2016-03-04 ENCOUNTER — Ambulatory Visit: Payer: Medicare HMO | Admitting: Physical Therapy

## 2016-03-11 ENCOUNTER — Ambulatory Visit: Payer: Medicare HMO | Attending: Orthopaedic Surgery | Admitting: Physical Therapy

## 2016-03-11 DIAGNOSIS — R600 Localized edema: Secondary | ICD-10-CM | POA: Diagnosis present

## 2016-03-11 DIAGNOSIS — M25622 Stiffness of left elbow, not elsewhere classified: Secondary | ICD-10-CM | POA: Diagnosis present

## 2016-03-11 DIAGNOSIS — M6281 Muscle weakness (generalized): Secondary | ICD-10-CM | POA: Insufficient documentation

## 2016-03-11 DIAGNOSIS — R531 Weakness: Secondary | ICD-10-CM | POA: Diagnosis present

## 2016-03-11 DIAGNOSIS — M25511 Pain in right shoulder: Secondary | ICD-10-CM | POA: Diagnosis present

## 2016-03-11 DIAGNOSIS — M542 Cervicalgia: Secondary | ICD-10-CM

## 2016-03-11 DIAGNOSIS — M2560 Stiffness of unspecified joint, not elsewhere classified: Secondary | ICD-10-CM | POA: Diagnosis present
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
## 2016-03-19 ENCOUNTER — Ambulatory Visit: Payer: Medicare HMO | Admitting: Physical Therapy

## 2016-03-19 DIAGNOSIS — M25511 Pain in right shoulder: Secondary | ICD-10-CM

## 2016-03-19 DIAGNOSIS — M2560 Stiffness of unspecified joint, not elsewhere classified: Secondary | ICD-10-CM

## 2016-03-19 DIAGNOSIS — M542 Cervicalgia: Secondary | ICD-10-CM

## 2016-03-19 DIAGNOSIS — R531 Weakness: Secondary | ICD-10-CM

## 2016-03-19 DIAGNOSIS — IMO0002 Reserved for concepts with insufficient information to code with codable children: Secondary | ICD-10-CM

## 2016-03-19 DIAGNOSIS — R600 Localized edema: Secondary | ICD-10-CM

## 2016-03-19 DIAGNOSIS — M6281 Muscle weakness (generalized): Secondary | ICD-10-CM

## 2016-03-19 NOTE — Therapy (Signed)
[place], [place], [address] Phone: [phone]   Fax:  [phone]  Physical Therapy Treatment  Patient Details  Name: Ashley Riddle MRN: [id number] Date of Birth: Nov 25, 1945 Referring Provider: Dr Joni Fears   Encounter Date: 03/19/2016      PT End of Session - 03/19/16 1645    Visit Number 2   Number of Visits 16   Date for PT Re-Evaluation 05/06/16   PT Start Time 6734   PT Stop Time 1937   PT Time Calculation (min) 58 min   Behavior During Therapy [facility] for tasks assessed/performed      Past Medical History:  Diagnosis Date  . Anxiety   . Arthritis   . Constipation   . DDD (degenerative disc disease), cervical   . DDD (degenerative disc disease), lumbar   . Gout   . Heart murmur    probable bicuspid AV with mild AS, mild MR by 04/22/14 Echo (Dr. Montez Morita)  . Hepatitis C    treated 2016   . History of blood transfusion   . HTN (hypertension)   . Hypertension   . Pneumonia 12/02/13    Past Surgical History:  Procedure Laterality Date  . ABDOMINAL HYSTERECTOMY    . APPENDECTOMY    . BACK SURGERY    . CESAREAN SECTION     x 3  . COLONOSCOPY W/ POLYPECTOMY    . HARDWARE REMOVAL Left 12/26/2015   Procedure: REMOVAL K-WIRE LEFT SCAPULA;  Surgeon: Garald Balding, MD;  Location: [facility];  Service: Orthopedics;  Laterality: Left;  . INNER EAR SURGERY     blood vessel  . TONSILLECTOMY    . TOTAL SHOULDER ARTHROPLASTY Left 12/27/2014   Procedure: TOTAL SHOULDER ARTHROPLASTY;  Surgeon: Garald Balding, MD;  Location: [facility];  Service: Orthopedics;  Laterality: Left;    There were no vitals filed for this visit.      Subjective Assessment - 03/19/16 1549    Subjective pain about the same. She has been doing her exercises.  I don't feel as tight as I did.  Sometimes my neck locks up and it is hard to move.   Currently in Pain? Yes   Pain Score 5    Pain Location Neck   Pain  Orientation Right   Pain Descriptors / Indicators Tightness;Aching   Pain Frequency constant   Aggravating Factors  turning head to left,  Not able to sleep at night.    Pain Relieving Factors Heat,  exercise,  Advil,  warm shower,  heating pad   Effect of Pain on Daily Activities sleeping difficult, turning head difficult sometimes hard to dress                         [facility] Adult PT Treatment/Exercise - 03/19/16 0001      Shoulder Exercises: Supine   External Rotation 5 reps  cane, cues   Internal Rotation 5 reps  cane, cues   Other Supine Exercises shoulder press 5 x 5 seconds  into table, also head press 3 x 5 seconds   Other Supine Exercises cane chest press 10 X,  slower, guarded.     Shoulder Exercises: Seated   Other Seated Exercises retraction 10 x, cues for small movements.     Modalities   Modalities Moist Heat     Moist Heat Therapy   Number Minutes Moist Heat 15 Minutes   Moist Heat Location  Cervical     Manual Therapy   Manual therapy comments sub-occipitla release to cervical spine; upper trap trigger point relelase right side; light manual traction;    tissue softened.  Light pressure only,                    PT Short Term Goals - 03/19/16 1648      PT SHORT TERM GOAL #1   Title -Pt will increase cervical rotation by 25 degrees bilateral    Time 4   Period Weeks   Status On-going     PT SHORT TERM GOAL #2   Title Patient will demsotrate full right active shoulder flexion without pain    Baseline continues to be limited   Time 4   Period Weeks   Status On-going     PT SHORT TERM GOAL #3   Title Patient will demostrate 4/5 right shoulder flexion    Time 4   Period Weeks   Status Unable to assess     PT SHORT TERM GOAL #4   Title Patient will report decreased tenderness to palpation int he right upper trap    Baseline very tender   Time 4   Period Weeks   Status On-going     PT SHORT TERM GOAL #5   Title Patient  will be independent with initial HEP    Baseline needs cues   Time 4   Period Weeks   Status On-going           PT Long Term Goals - 03/11/16 1217      PT LONG TERM GOAL #1   Title Patient will increase bilateral cervical rotation to 65 degrees bilateral in order to increase safety when driving   Time 8   Period Weeks   Status New     PT LONG TERM GOAL #2   Title Patient will reach into a cabinet with right shoulder without increased pain    Time 8   Period Weeks   Status New     PT LONG TERM GOAL #3   Title Patient will sleep at night without cervical spine pain    Time 8   Period Weeks   Status New               Plan - 03/19/16 1646    Clinical Impression Statement Patient has been doing her home exercises, however she needs minor cues.  Manual soft tissue work tolerated with light pressure.  No new goals met.  Shoulder flexion with cane 85 degrees in supine.  Limited by Left lateral neck pain.    PT Next Visit Plan Continue with manual therapy. Add seated sacpaular retraction, use modalities as needed. Add shoulder stregthening when able. At this point she is having significant pain with all shoulder and neck movements.    PT Home Exercise Plan shoulder flexion w/ wand, cervical rotation    Consulted and Agree with Plan of Care Patient      Patient will benefit from skilled therapeutic intervention in order to improve the following deficits and impairments:  Decreased activity tolerance, Decreased strength, Decreased mobility, Increased muscle spasms, Pain, Decreased range of motion, Increased fascial restricitons  Visit Diagnosis: Cervicalgia  Muscle weakness (generalized)  Acute pain of right shoulder  Stiffness of extremity  Weakness  Localized edema     Problem List Patient Active Problem List   Diagnosis Date Noted  . Retained metal fragment left scapula 12/26/2015  . Fracture  of glenoid process of left scapula 12/29/2014  . Status post  total shoulder arthroplasty 12/27/2014  . Cocaine dependence in remission ([place]) 09/29/2014  . Severe heroin dependence in sustained remission ([place]) 09/29/2014  . Opiate abuse, episodic 09/29/2014  . Mild tetrahydrocannabinol (THC) abuse 09/29/2014  . Adjustment disorder with depressed mood 09/29/2014  . Chronic pain syndrome 04/18/2014  . Primary osteoarthritis of left shoulder 04/18/2014  . Primary osteoarthritis of right knee 04/18/2014  . Primary osteoarthritis of left knee 04/18/2014  . ANXIETY 03/22/2010  . HYPERTENSION, BENIGN ESSENTIAL 03/22/2010  . ASTHMA 03/22/2010  . CONSTIPATION 03/22/2010  . HEPATITIS C 06/04/1995    Neela Zecca PTA 03/19/2016, 4:49 PM  [facility] [address] [place], [place], [address] Phone: [phone]   Fax:  [phone]  Name: LIANETTE BROUSSARD MRN: [id number] Date of Birth: [birth year]

## 2016-03-21 ENCOUNTER — Ambulatory Visit: Payer: Medicare HMO | Admitting: Physical Therapy

## 2016-03-21 DIAGNOSIS — M542 Cervicalgia: Secondary | ICD-10-CM

## 2016-03-21 DIAGNOSIS — IMO0002 Reserved for concepts with insufficient information to code with codable children: Secondary | ICD-10-CM

## 2016-03-21 DIAGNOSIS — M25511 Pain in right shoulder: Secondary | ICD-10-CM

## 2016-03-21 DIAGNOSIS — M25622 Stiffness of left elbow, not elsewhere classified: Secondary | ICD-10-CM

## 2016-03-21 DIAGNOSIS — M6281 Muscle weakness (generalized): Secondary | ICD-10-CM

## 2016-03-21 DIAGNOSIS — M2560 Stiffness of unspecified joint, not elsewhere classified: Secondary | ICD-10-CM

## 2016-03-21 DIAGNOSIS — R531 Weakness: Secondary | ICD-10-CM

## 2016-03-21 DIAGNOSIS — R600 Localized edema: Secondary | ICD-10-CM

## 2016-03-21 NOTE — Therapy (Signed)
[place], [place], [address] Phone: [phone]   Fax:  [phone]  Physical Therapy Treatment  Patient Details  Name: Ashley Riddle MRN: [id number] Date of Birth: [birth year] Referring Provider: Dr Joni Fears   Encounter Date: 03/21/2016      PT End of Session - 03/21/16 1553    Visit Number 3   Number of Visits 16   Date for PT Re-Evaluation 05/06/16   PT Start Time 1500   PT Stop Time 1543   PT Time Calculation (min) 43 min   Activity Tolerance Patient tolerated treatment well   Behavior During Therapy [facility] for tasks assessed/performed      Past Medical History:  Diagnosis Date  . Anxiety   . Arthritis   . Constipation   . DDD (degenerative disc disease), cervical   . DDD (degenerative disc disease), lumbar   . Gout   . Heart murmur    probable bicuspid AV with mild AS, mild MR by 04/22/14 Echo (Dr. Montez Morita)  . Hepatitis C    treated 2016   . History of blood transfusion   . HTN (hypertension)   . Hypertension   . Pneumonia 12/02/13    Past Surgical History:  Procedure Laterality Date  . ABDOMINAL HYSTERECTOMY    . APPENDECTOMY    . BACK SURGERY    . CESAREAN SECTION     x 3  . COLONOSCOPY W/ POLYPECTOMY    . HARDWARE REMOVAL Left 12/26/2015   Procedure: REMOVAL K-WIRE LEFT SCAPULA;  Surgeon: Garald Balding, MD;  Location: [facility];  Service: Orthopedics;  Laterality: Left;  . INNER EAR SURGERY     blood vessel  . TONSILLECTOMY    . TOTAL SHOULDER ARTHROPLASTY Left 12/27/2014   Procedure: TOTAL SHOULDER ARTHROPLASTY;  Surgeon: Garald Balding, MD;  Location: [facility];  Service: Orthopedics;  Laterality: Left;    There were no vitals filed for this visit.      Subjective Assessment - 03/21/16 1516    Subjective Pain is not as bad.  head is moving a little more.  Driving is still painful, but at least I can tiurn a little better.  Mild pain with moderate at times.    Currently in Pain? Yes   Pain Score --  mild to moderate   Pain Location Neck   Pain Orientation Right   Aggravating Factors  driving, not sleeping   Pain Relieving Factors PT,  some exercoses help, heating pad.  head rest U shaped   Effect of Pain on Daily Activities hard to sleep, drive,     Multiple Pain Sites No            [facility] PT Assessment - 03/21/16 0001      AROM   Cervical - Right Rotation 40   Cervical - Left Rotation 50                     [facility] Adult PT Treatment/Exercise - 03/21/16 0001      Neck Exercises: Seated   Neck Retraction 10 reps   Cervical Rotation 5 reps   Lateral Flexion 5 reps  with hand assist, right and left, limited by pain   Lateral Flexion Limitations pain     Shoulder Exercises: Seated   Retraction 10 reps;AROM   Row 10 reps   Theraband Level (Shoulder Row) Level 1 (Yellow)  HEP   Row Limitations small motions, cues  Shoulder Exercises: Pulleys   Flexion 2 minutes     Shoulder Exercises: ROM/Strengthening   Other ROM/Strengthening Exercises Nu step 8 minutes, L5  arms, legs     Manual Therapy   Manual therapy comments light soft tissue work,  PROM shoulder frleiox, ER .  Abduction too pain ful  s stopped.                 PT Education - 03/21/16 1531    Education provided Yes   Education Details rows, band   Person(s) Educated Patient   Methods Explanation   Comprehension Verbalized understanding;Returned demonstration;Need further instruction          PT Short Term Goals - 03/19/16 1648      PT SHORT TERM GOAL #1   Title -Pt will increase cervical rotation by 25 degrees bilateral    Time 4   Period Weeks   Status On-going     PT SHORT TERM GOAL #2   Title Patient will demsotrate full right active shoulder flexion without pain    Baseline continues to be limited   Time 4   Period Weeks   Status On-going     PT SHORT TERM GOAL #3   Title Patient will demostrate 4/5 right shoulder  flexion    Time 4   Period Weeks   Status Unable to assess     PT SHORT TERM GOAL #4   Title Patient will report decreased tenderness to palpation int he right upper trap    Baseline very tender   Time 4   Period Weeks   Status On-going     PT SHORT TERM GOAL #5   Title Patient will be independent with initial HEP    Baseline needs cues   Time 4   Period Weeks   Status On-going           PT Long Term Goals - 03/11/16 1217      PT LONG TERM GOAL #1   Title Patient will increase bilateral cervical rotation to 65 degrees bilateral in order to increase safety when driving   Time 8   Period Weeks   Status New     PT LONG TERM GOAL #2   Title Patient will reach into a cabinet with right shoulder without increased pain    Time 8   Period Weeks   Status New     PT LONG TERM GOAL #3   Title Patient will sleep at night without cervical spine pain    Time 8   Period Weeks   Status New               Plan - 03/21/16 1556    Clinical Impression Statement LT cervical rotaion 50 (improved 9),  RT : 40 (improved 10).  Abduction rt shoulder very painful and patient does not tolerate ROM well,  Soft tissue work tolerated light  only.  Tissue is progressively softening., tho stll very stiff.   Progress toward ROM goal,  Progress toward exercxise goals.    PT Next Visit Plan Continue with manual therapy. review seated sacpaular retraction, use modalities as needed. Add shoulder stregthening when able.     PT Home Exercise Plan shoulder flexion w/ wand, cervical rotation ,  03/21/2016 rows seated with yellow band.    Consulted and Agree with Plan of Care Patient      Patient will benefit from skilled therapeutic intervention in order to improve the following deficits and impairments:  Decreased activity tolerance, Decreased strength, Decreased mobility, Increased muscle spasms, Pain, Decreased range of motion, Increased fascial restricitons  Visit  Diagnosis: Cervicalgia  Muscle weakness (generalized)  Acute pain of right shoulder  Stiffness of extremity  Weakness  Localized edema  Stiffness of elbow joint, left     Problem List Patient Active Problem List   Diagnosis Date Noted  . Retained metal fragment left scapula 12/26/2015  . Fracture of glenoid process of left scapula 12/29/2014  . Status post total shoulder arthroplasty 12/27/2014  . Cocaine dependence in remission ([place]) 09/29/2014  . Severe heroin dependence in sustained remission ([place]) 09/29/2014  . Opiate abuse, episodic 09/29/2014  . Mild tetrahydrocannabinol (THC) abuse 09/29/2014  . Adjustment disorder with depressed mood 09/29/2014  . Chronic pain syndrome 04/18/2014  . Primary osteoarthritis of left shoulder 04/18/2014  . Primary osteoarthritis of right knee 04/18/2014  . Primary osteoarthritis of left knee 04/18/2014  . ANXIETY 03/22/2010  . HYPERTENSION, BENIGN ESSENTIAL 03/22/2010  . ASTHMA 03/22/2010  . CONSTIPATION 03/22/2010  . HEPATITIS C 06/04/1995    Zhaire Locker PTA 03/21/2016, 4:02 PM  [facility] [address] [place], [place], [address] Phone: [phone]   Fax:  [phone]  Name: Ashley Riddle MRN: [id number] Date of Birth: February 03, 1946

## 2016-03-21 NOTE — Patient Instructions (Signed)
Resistive Band Rowing    With resistive band anchored in door, grasp both ends. Keeping elbows bent, pull back, squeezing shoulder blades together. Repeat __10__ times. Do __1-2__ sessions per day.  [email or web address].exer.us/98   Copyright  VHI. All rights reserved.

## 2016-03-25 ENCOUNTER — Ambulatory Visit: Payer: Medicare HMO | Admitting: Physical Therapy

## 2016-03-25 DIAGNOSIS — IMO0002 Reserved for concepts with insufficient information to code with codable children: Secondary | ICD-10-CM

## 2016-03-25 DIAGNOSIS — M25511 Pain in right shoulder: Secondary | ICD-10-CM

## 2016-03-25 DIAGNOSIS — M542 Cervicalgia: Secondary | ICD-10-CM

## 2016-03-25 DIAGNOSIS — R600 Localized edema: Secondary | ICD-10-CM

## 2016-03-25 DIAGNOSIS — R531 Weakness: Secondary | ICD-10-CM

## 2016-03-25 DIAGNOSIS — M6281 Muscle weakness (generalized): Secondary | ICD-10-CM

## 2016-03-25 DIAGNOSIS — M2560 Stiffness of unspecified joint, not elsewhere classified: Secondary | ICD-10-CM

## 2016-03-25 NOTE — Therapy (Signed)
[place], [place], [address] Phone: [phone]   Fax:  [phone]  Physical Therapy Treatment  Patient Details  Name: Ashley Riddle MRN: [id number] Date of Birth: August 18, 1945 Referring Provider: Dr Joni Fears   Encounter Date: 03/25/2016      PT End of Session - 03/25/16 1751    Visit Number 4   Number of Visits 16   Date for PT Re-Evaluation 05/06/16   PT Start Time 4401  charge will not equal time slot due to patient was late   PT Stop Time 1645   PT Time Calculation (min) 47 min   Activity Tolerance Patient tolerated treatment well   Behavior During Therapy [facility] for tasks assessed/performed      Past Medical History:  Diagnosis Date  . Anxiety   . Arthritis   . Constipation   . DDD (degenerative disc disease), cervical   . DDD (degenerative disc disease), lumbar   . Gout   . Heart murmur    probable bicuspid AV with mild AS, mild MR by 04/22/14 Echo (Dr. Montez Morita)  . Hepatitis C    treated 2016   . History of blood transfusion   . HTN (hypertension)   . Hypertension   . Pneumonia 12/02/13    Past Surgical History:  Procedure Laterality Date  . ABDOMINAL HYSTERECTOMY    . APPENDECTOMY    . BACK SURGERY    . CESAREAN SECTION     x 3  . COLONOSCOPY W/ POLYPECTOMY    . HARDWARE REMOVAL Left 12/26/2015   Procedure: REMOVAL K-WIRE LEFT SCAPULA;  Surgeon: Garald Balding, MD;  Location: [place];  Service: Orthopedics;  Laterality: Left;  . INNER EAR SURGERY     blood vessel  . TONSILLECTOMY    . TOTAL SHOULDER ARTHROPLASTY Left 12/27/2014   Procedure: TOTAL SHOULDER ARTHROPLASTY;  Surgeon: Garald Balding, MD;  Location: [place];  Service: Orthopedics;  Laterality: Left;    There were no vitals filed for this visit.      Subjective Assessment - 03/25/16 1600    Subjective Neck is feeling better.  Driving is getting better gradually.   Currently in Pain? Yes   Pain Score --   mild   Pain Location Neck   Pain Descriptors / Indicators --  knot   Pain Radiating Towards no   Aggravating Factors  turning head.   pin keeps her awake   Pain Relieving Factors Travel pillow.,   heating pad , PT   Effect of Pain on Daily Activities hard to sleep,  ,  driving painful with certain neck motions.    Multiple Pain Sites No            [facility] PT Assessment - 03/25/16 0001      AROM   Overall AROM Comments shoulder flexion 102, abduction 92.   Cervical - Right Rotation 15  measured prior to treament   Cervical - Left Rotation 50     PROM   Overall PROM Comments 132 PROM flexion rt,  increased neck pain to severe pain with no end range noted in shoulder                     [facility] Adult PT Treatment/Exercise - 03/25/16 0001      Neck Exercises: Seated   Cervical Rotation 5 reps   Cervical Rotation Limitations painful to right   Shoulder Shrugs 5 reps   Shoulder Rolls  5 reps   Shoulder Flexion Limitations Painful in neck area.  Right. 2 reps   Shoulder Abduction Limitations painful 2 reps AROM     Shoulder Exercises: Seated   Row 10 reps   Theraband Level (Shoulder Row) Level 1 (Yellow)   External Rotation 10 reps   Theraband Level (Shoulder External Rotation) Level 1 (Yellow)     Modalities   Modalities Moist Heat     Moist Heat Therapy   Number Minutes Moist Heat 15 Minutes   Moist Heat Location Cervical     Manual Therapy   Manual therapy comments light soft tissue work  neck , upper trap  gentle shoulder flexion right.  abduction rt,  ER rt                  PT Short Term Goals - 03/25/16 1756      PT SHORT TERM GOAL #1   Title -Pt will increase cervical rotation by 25 degrees bilateral    Baseline varies.   Time 4   Period Weeks   Status On-going     PT SHORT TERM GOAL #2   Title Patient will demsotrate full right active shoulder flexion without pain    Baseline 102 , moderate pain   Time 4   Status On-going      PT SHORT TERM GOAL #3   Title Patient will demostrate 4/5 right shoulder flexion    Time 4   Period Weeks   Status Unable to assess     PT SHORT TERM GOAL #4   Title Patient will report decreased tenderness to palpation int he right upper trap    Baseline tender   Time 4   Period Weeks   Status On-going     PT SHORT TERM GOAL #5   Title Patient will be independent with initial HEP    Time 4   Period Weeks   Status Unable to assess           PT Long Term Goals - 03/11/16 1217      PT LONG TERM GOAL #1   Title Patient will increase bilateral cervical rotation to 65 degrees bilateral in order to increase safety when driving   Time 8   Period Weeks   Status New     PT LONG TERM GOAL #2   Title Patient will reach into a cabinet with right shoulder without increased pain    Time 8   Period Weeks   Status New     PT LONG TERM GOAL #3   Title Patient will sleep at night without cervical spine pain    Time 8   Period Weeks   Status New               Plan - 03/25/16 1753    Clinical Impression Statement Short session due to patient had been stung by a bee and needed to go to ER for EPI shot. Patient was monitored closely for any alergic rections.  Neck has decreased rotation with 15 degrees to right.  Shoulder AROM 102, abduction to 92.  PROM 132 however she says the pain was severs with no end range.  pain decreased with rest and moist heat today..  No new goals met.     PT Next Visit Plan try neck/shoulder isometrics.  supine ROM?   PT Home Exercise Plan shoulder flexion w/ wand, cervical rotation ,  03/21/2016 rows seated with yellow band.    Consulted and  Agree with Plan of Care Patient      Patient will benefit from skilled therapeutic intervention in order to improve the following deficits and impairments:  Decreased activity tolerance, Decreased strength, Decreased mobility, Increased muscle spasms, Pain, Decreased range of motion, Increased fascial  restricitons  Visit Diagnosis: Cervicalgia  Muscle weakness (generalized)  Acute pain of right shoulder  Stiffness of extremity  Weakness  Localized edema     Problem List Patient Active Problem List   Diagnosis Date Noted  . Retained metal fragment left scapula 12/26/2015  . Fracture of glenoid process of left scapula 12/29/2014  . Status post total shoulder arthroplasty 12/27/2014  . Cocaine dependence in remission ([place]) 09/29/2014  . Severe heroin dependence in sustained remission ([place]) 09/29/2014  . Opiate abuse, episodic 09/29/2014  . Mild tetrahydrocannabinol (THC) abuse 09/29/2014  . Adjustment disorder with depressed mood 09/29/2014  . Chronic pain syndrome 04/18/2014  . Primary osteoarthritis of left shoulder 04/18/2014  . Primary osteoarthritis of right knee 04/18/2014  . Primary osteoarthritis of left knee 04/18/2014  . ANXIETY 03/22/2010  . HYPERTENSION, BENIGN ESSENTIAL 03/22/2010  . ASTHMA 03/22/2010  . CONSTIPATION 03/22/2010  . HEPATITIS C 06/04/1995    [name],[name]  PTA 03/25/2016, 5:59 PM  [facility] [address] [place], [place], [address] Phone: [phone]   Fax:  [phone]  Name: Ashley Riddle MRN: [id number] Date of Birth: Nov 29, 1945

## 2016-03-27 ENCOUNTER — Ambulatory Visit: Payer: Medicare HMO | Admitting: Physical Therapy

## 2016-03-27 DIAGNOSIS — M542 Cervicalgia: Secondary | ICD-10-CM | POA: Diagnosis not present

## 2016-03-27 DIAGNOSIS — IMO0002 Reserved for concepts with insufficient information to code with codable children: Secondary | ICD-10-CM

## 2016-03-27 DIAGNOSIS — M6281 Muscle weakness (generalized): Secondary | ICD-10-CM

## 2016-03-27 DIAGNOSIS — M25511 Pain in right shoulder: Secondary | ICD-10-CM

## 2016-03-27 DIAGNOSIS — M2560 Stiffness of unspecified joint, not elsewhere classified: Secondary | ICD-10-CM

## 2016-03-27 NOTE — Therapy (Signed)
[place], [place], [address] Phone: [phone]   Fax:  [phone]  Physical Therapy Treatment  Patient Details  Name: Ashley Riddle MRN: [id number] Date of Birth: Aug 21, 1945 Referring Provider: Dr Joni Fears   Encounter Date: 03/27/2016      PT End of Session - 03/27/16 1604    Visit Number 5   Number of Visits 16   Date for PT Re-Evaluation 05/06/16   PT Start Time 1503   PT Stop Time 1558   PT Time Calculation (min) 55 min   Activity Tolerance Patient tolerated treatment well   Behavior During Therapy [facility] for tasks assessed/performed      Past Medical History:  Diagnosis Date  . Anxiety   . Arthritis   . Constipation   . DDD (degenerative disc disease), cervical   . DDD (degenerative disc disease), lumbar   . Gout   . Heart murmur    probable bicuspid AV with mild AS, mild MR by 04/22/14 Echo (Dr. Montez Morita)  . Hepatitis C    treated 2016   . History of blood transfusion   . HTN (hypertension)   . Hypertension   . Pneumonia 12/02/13    Past Surgical History:  Procedure Laterality Date  . ABDOMINAL HYSTERECTOMY    . APPENDECTOMY    . BACK SURGERY    . CESAREAN SECTION     x 3  . COLONOSCOPY W/ POLYPECTOMY    . HARDWARE REMOVAL Left 12/26/2015   Procedure: REMOVAL K-WIRE LEFT SCAPULA;  Surgeon: Garald Balding, MD;  Location: [place];  Service: Orthopedics;  Laterality: Left;  . INNER EAR SURGERY     blood vessel  . TONSILLECTOMY    . TOTAL SHOULDER ARTHROPLASTY Left 12/27/2014   Procedure: TOTAL SHOULDER ARTHROPLASTY;  Surgeon: Garald Balding, MD;  Location: [place];  Service: Orthopedics;  Laterality: Left;    There were no vitals filed for this visit.      Subjective Assessment - 03/27/16 1510    Subjective I have been exercising in the car, I have been moving my neck in the bed like we do here.    Currently in Pain? --  mild   Pain Location Neck   Pain  Orientation Right   Pain Type Acute pain   Pain Frequency Intermittent   Aggravating Factors  turning head   Pain Relieving Factors Exercises   Effect of Pain on Daily Activities certain neck motions.            [facility] PT Assessment - 03/27/16 0001      AROM   Overall AROM Comments 132  greatly improved                     [facility] Adult PT Treatment/Exercise - 03/27/16 0001      Neck Exercises: Seated   Cervical Rotation 5 reps  2 sets   Cervical Rotation Limitations ROM increased   Shoulder Shrugs 5 reps   Shoulder Rolls 5 reps     Shoulder Exercises: Seated   Row 10 reps   Theraband Level (Shoulder Row) Level 1 (Yellow)   Row Limitations improved range   External Rotation 10 reps   Theraband Level (Shoulder External Rotation) Level 1 (Yellow)  with rolled towel   External Rotation Limitations single and both   Internal Rotation 10 reps  with rolled towel   Theraband Level (Shoulder Internal Rotation) Level 1 (Yellow)   Other  Seated Exercises Isometric shoulder extension 3 x 5, stopped due to pain.     Shoulder Exercises: Pulleys   Flexion 2 minutes     Shoulder Exercises: ROM/Strengthening   UBE (Upper Arm Bike) Nu step L 4 X 6 minutes     Modalities   Modalities Moist Heat     Moist Heat Therapy   Number Minutes Moist Heat 15 Minutes   Moist Heat Location Cervical;Shoulder                  PT Short Term Goals - 03/25/16 1756      PT SHORT TERM GOAL #1   Title -Pt will increase cervical rotation by 25 degrees bilateral    Baseline varies.   Time 4   Period Weeks   Status On-going     PT SHORT TERM GOAL #2   Title Patient will demsotrate full right active shoulder flexion without pain    Baseline 102 , moderate pain   Time 4   Status On-going     PT SHORT TERM GOAL #3   Title Patient will demostrate 4/5 right shoulder flexion    Time 4   Period Weeks   Status Unable to assess     PT SHORT TERM GOAL #4   Title Patient  will report decreased tenderness to palpation int he right upper trap    Baseline tender   Time 4   Period Weeks   Status On-going     PT SHORT TERM GOAL #5   Title Patient will be independent with initial HEP    Time 4   Period Weeks   Status Unable to assess           PT Long Term Goals - 03/11/16 1217      PT LONG TERM GOAL #1   Title Patient will increase bilateral cervical rotation to 65 degrees bilateral in order to increase safety when driving   Time 8   Period Weeks   Status New     PT LONG TERM GOAL #2   Title Patient will reach into a cabinet with right shoulder without increased pain    Time 8   Period Weeks   Status New     PT LONG TERM GOAL #3   Title Patient will sleep at night without cervical spine pain    Time 8   Period Weeks   Status New               Plan - 03/27/16 1605    Clinical Impression Statement Patient Range in neck and shoulder improved.  132  Shoulder flexion.  Neck rotations 50 right/ 55 left.  No pain at end of session.  However she did not tolerate isometric shoulder extension.    PT Next Visit Plan Build HEP.  MMT   PT Home Exercise Plan shoulder flexion w/ wand, cervical rotation ,  03/21/2016 rows seated with yellow band.    Consulted and Agree with Plan of Care Patient      Patient will benefit from skilled therapeutic intervention in order to improve the following deficits and impairments:  Decreased activity tolerance, Decreased strength, Decreased mobility, Increased muscle spasms, Pain, Decreased range of motion, Increased fascial restricitons  Visit Diagnosis: Cervicalgia  Muscle weakness (generalized)  Acute pain of right shoulder  Stiffness of extremity     Problem List Patient Active Problem List   Diagnosis Date Noted  . Retained metal fragment left scapula 12/26/2015  .  Fracture of glenoid process of left scapula 12/29/2014  . Status post total shoulder arthroplasty 12/27/2014  . Cocaine  dependence in remission ([place]) 09/29/2014  . Severe heroin dependence in sustained remission ([place]) 09/29/2014  . Opiate abuse, episodic 09/29/2014  . Mild tetrahydrocannabinol (THC) abuse 09/29/2014  . Adjustment disorder with depressed mood 09/29/2014  . Chronic pain syndrome 04/18/2014  . Primary osteoarthritis of left shoulder 04/18/2014  . Primary osteoarthritis of right knee 04/18/2014  . Primary osteoarthritis of left knee 04/18/2014  . ANXIETY 03/22/2010  . HYPERTENSION, BENIGN ESSENTIAL 03/22/2010  . ASTHMA 03/22/2010  . CONSTIPATION 03/22/2010  . HEPATITIS C 06/04/1995    [name],[name] PTA 03/27/2016, 4:13 PM  [facility] [address] [place], [place], [address] Phone: [phone]   Fax:  [phone]  Name: Ashley Riddle MRN: [id number] Date of Birth: [birth year]

## 2016-04-01 ENCOUNTER — Ambulatory Visit: Payer: Medicare HMO | Admitting: Physical Therapy

## 2016-04-01 DIAGNOSIS — M25511 Pain in right shoulder: Secondary | ICD-10-CM

## 2016-04-01 DIAGNOSIS — M542 Cervicalgia: Secondary | ICD-10-CM

## 2016-04-01 DIAGNOSIS — M6281 Muscle weakness (generalized): Secondary | ICD-10-CM

## 2016-04-01 NOTE — Therapy (Signed)
[place], [place], [address] Phone: [phone]   Fax:  [phone]  Physical Therapy Treatment  Patient Details  Name: Ashley Riddle MRN: [id number] Date of Birth: 1945-12-07 Referring Provider: Dr Joni Fears   Encounter Date: 04/01/2016      PT End of Session - 04/01/16 1712    Visit Number 5   Number of Visits 16   Date for PT Re-Evaluation 05/06/16   Authorization Type [employer] medicare complete    PT Start Time [id number]   PT Stop Time 1635   PT Time Calculation (min) 50 min      Past Medical History:  Diagnosis Date  . Anxiety   . Arthritis   . Constipation   . DDD (degenerative disc disease), cervical   . DDD (degenerative disc disease), lumbar   . Gout   . Heart murmur    probable bicuspid AV with mild AS, mild MR by 04/22/14 Echo (Dr. Montez Morita)  . Hepatitis C    treated 2016   . History of blood transfusion   . HTN (hypertension)   . Hypertension   . Pneumonia 12/02/13    Past Surgical History:  Procedure Laterality Date  . ABDOMINAL HYSTERECTOMY    . APPENDECTOMY    . BACK SURGERY    . CESAREAN SECTION     x 3  . COLONOSCOPY W/ POLYPECTOMY    . HARDWARE REMOVAL Left 12/26/2015   Procedure: REMOVAL K-WIRE LEFT SCAPULA;  Surgeon: Garald Balding, MD;  Location: [facility];  Service: Orthopedics;  Laterality: Left;  . INNER EAR SURGERY     blood vessel  . TONSILLECTOMY    . TOTAL SHOULDER ARTHROPLASTY Left 12/27/2014   Procedure: TOTAL SHOULDER ARTHROPLASTY;  Surgeon: Garald Balding, MD;  Location: [facility];  Service: Orthopedics;  Laterality: Left;    There were no vitals filed for this visit.      Subjective Assessment - 04/01/16 1711    Subjective Patient continues to report improved pain and improved use of his right shoulder.    Limitations Lifting   How long can you sit comfortably? No limit    How long can you stand comfortably? No limit    Diagnostic tests  Cervical CT: Cervical spondylosis No fracture   Patient Stated Goals To have less pain in her neck   Currently in Pain? No/denies                         [facility] Adult PT Treatment/Exercise - 04/01/16 0001      Neck Exercises: Seated   Cervical Rotation 5 reps  2 sets   Cervical Rotation Limitations ROM increased   Shoulder Shrugs 5 reps   Shoulder Rolls 5 reps     Moist Heat Therapy   Number Minutes Moist Heat 15 Minutes   Moist Heat Location Cervical;Shoulder     Manual Therapy   Manual therapy comments sub-occipitla release to cervical spine; upper trap trigger point relelase right side; light manual traction;    tissue softened.  Light pressure only,                  PT Education - 04/01/16 1712    Education provided Yes   Education Details rows and bands    Person(s) Educated Patient   Methods Explanation   Comprehension Returned demonstration;Verbalized understanding;Verbal cues required          PT Short Term  Goals - 04/01/16 1714      PT SHORT TERM GOAL #1   Title -Pt will increase cervical rotation by 25 degrees bilateral    Baseline varies.   Time 4   Period Weeks   Status On-going     PT SHORT TERM GOAL #2   Title Patient will demsotrate full right active shoulder flexion without pain    Baseline 102 , moderate pain   Time 4   Period Weeks   Status On-going     PT SHORT TERM GOAL #3   Title Patient will demostrate 4/5 right shoulder flexion    Time 4   Period Weeks   Status Unable to assess     PT SHORT TERM GOAL #4   Title Patient will report decreased tenderness to palpation int he right upper trap    Baseline tender   Time 4   Period Weeks     PT SHORT TERM GOAL #5   Title Patient will be independent with initial HEP    Baseline needs cues   Time 4   Period Weeks   Status Unable to assess           PT Long Term Goals - 03/11/16 1217      PT LONG TERM GOAL #1   Title Patient will increase bilateral  cervical rotation to 65 degrees bilateral in order to increase safety when driving   Time 8   Period Weeks   Status New     PT LONG TERM GOAL #2   Title Patient will reach into a cabinet with right shoulder without increased pain    Time 8   Period Weeks   Status New     PT LONG TERM GOAL #3   Title Patient will sleep at night without cervical spine pain    Time 8   Period Weeks   Status New               Plan - 04/01/16 1712    Clinical Impression Statement Patient continues to have some spasming of his left upper trap but over all she tolerated treatment well. She was able to perfrom all exercises with reports of a minor increase in pain.    Rehab Potential Good   PT Frequency 2x / week   PT Duration 8 weeks   PT Treatment/Interventions ADLs/Self Care Home Management;Dry needling;Taping;Passive range of motion;Patient/family education;Neuromuscular re-education;Therapeutic exercise;Therapeutic activities;Electrical Stimulation;Iontophoresis 4mg /ml Dexamethasone;Cryotherapy;Moist Heat;Ultrasound;Traction;Manual techniques   PT Next Visit Plan Build HEP.  MMT   PT Home Exercise Plan shoulder flexion w/ wand, cervical rotation ,  03/21/2016 rows seated with yellow band.    Consulted and Agree with Plan of Care Patient      Patient will benefit from skilled therapeutic intervention in order to improve the following deficits and impairments:  Decreased activity tolerance, Decreased strength, Decreased mobility, Increased muscle spasms, Pain, Decreased range of motion, Increased fascial restricitons  Visit Diagnosis: Cervicalgia  Muscle weakness (generalized)  Acute pain of right shoulder     Problem List Patient Active Problem List   Diagnosis Date Noted  . Retained metal fragment left scapula 12/26/2015  . Fracture of glenoid process of left scapula 12/29/2014  . Status post total shoulder arthroplasty 12/27/2014  . Cocaine dependence in remission ([place])  09/29/2014  . Severe heroin dependence in sustained remission ([place]) 09/29/2014  . Opiate abuse, episodic 09/29/2014  . Mild tetrahydrocannabinol (THC) abuse 09/29/2014  . Adjustment disorder with depressed mood 09/29/2014  .  Chronic pain syndrome 04/18/2014  . Primary osteoarthritis of left shoulder 04/18/2014  . Primary osteoarthritis of right knee 04/18/2014  . Primary osteoarthritis of left knee 04/18/2014  . ANXIETY 03/22/2010  . HYPERTENSION, BENIGN ESSENTIAL 03/22/2010  . ASTHMA 03/22/2010  . CONSTIPATION 03/22/2010  . HEPATITIS C 06/04/1995    Carney Living 04/01/2016, 5:17 PM  [facility] [address] [place], [place], [address] Phone: [phone]   Fax:  [phone]  Name: Ashley Riddle MRN: [id number] Date of Birth: July 02, 1945

## 2016-04-03 ENCOUNTER — Ambulatory Visit: Payer: Medicare HMO | Admitting: Physical Therapy

## 2016-04-04 ENCOUNTER — Ambulatory Visit: Payer: Medicare HMO | Attending: Orthopaedic Surgery | Admitting: Physical Therapy

## 2016-04-04 DIAGNOSIS — M6281 Muscle weakness (generalized): Secondary | ICD-10-CM | POA: Diagnosis present

## 2016-04-04 DIAGNOSIS — M542 Cervicalgia: Secondary | ICD-10-CM | POA: Insufficient documentation

## 2016-04-04 DIAGNOSIS — R531 Weakness: Secondary | ICD-10-CM | POA: Diagnosis present

## 2016-04-04 DIAGNOSIS — M25511 Pain in right shoulder: Secondary | ICD-10-CM | POA: Diagnosis present

## 2016-04-04 DIAGNOSIS — M2560 Stiffness of unspecified joint, not elsewhere classified: Secondary | ICD-10-CM | POA: Insufficient documentation

## 2016-04-04 NOTE — Therapy (Signed)
[place], [place], [address] Phone: [phone]   Fax:  [phone]  Physical Therapy Treatment  Patient Details  Name: Ashley Riddle MRN: [id number] Date of Birth: 09/30/1945 Referring Provider: Dr Joni Fears   Encounter Date: 04/04/2016      PT End of Session - 04/04/16 1515    Visit Number 7   Number of Visits 16   Date for PT Re-Evaluation 05/06/16   Authorization Type [employer] medicare complete    PT Start Time 0845   PT Stop Time 0939   PT Time Calculation (min) 54 min   Activity Tolerance Patient tolerated treatment well   Behavior During Therapy [facility] for tasks assessed/performed      Past Medical History:  Diagnosis Date  . Anxiety   . Arthritis   . Constipation   . DDD (degenerative disc disease), cervical   . DDD (degenerative disc disease), lumbar   . Gout   . Heart murmur    probable bicuspid AV with mild AS, mild MR by 04/22/14 Echo (Dr. Montez Morita)  . Hepatitis C    treated 2016   . History of blood transfusion   . HTN (hypertension)   . Hypertension   . Pneumonia 12/02/13    Past Surgical History:  Procedure Laterality Date  . ABDOMINAL HYSTERECTOMY    . APPENDECTOMY    . BACK SURGERY    . CESAREAN SECTION     x 3  . COLONOSCOPY W/ POLYPECTOMY    . HARDWARE REMOVAL Left 12/26/2015   Procedure: REMOVAL K-WIRE LEFT SCAPULA;  Surgeon: Garald Balding, MD;  Location: [place];  Service: Orthopedics;  Laterality: Left;  . INNER EAR SURGERY     blood vessel  . TONSILLECTOMY    . TOTAL SHOULDER ARTHROPLASTY Left 12/27/2014   Procedure: TOTAL SHOULDER ARTHROPLASTY;  Surgeon: Garald Balding, MD;  Location: [place];  Service: Orthopedics;  Laterality: Left;    There were no vitals filed for this visit.      Subjective Assessment - 04/04/16 1349    Subjective Patient reports she has been feeling much better. She has a slight amount of pain when she turns her head to the  right. Overall she feels like she is driving better and having less pain at work. She is having 1-2/10 pain when she turns her head to the right.    Limitations Lifting   How long can you sit comfortably? No limit    How long can you stand comfortably? No limit    How long can you walk comfortably? No limit    Diagnostic tests Cervical CT: Cervical spondylosis No fracture   Currently in Pain? Yes   Pain Score 2    Pain Location Neck   Pain Orientation Right   Pain Type Acute pain   Pain Onset More than a month ago   Pain Frequency Intermittent   Aggravating Factors  turning head    Pain Relieving Factors Exercises    Effect of Pain on Daily Activities turning to the right                          [facility] Adult PT Treatment/Exercise - 04/04/16 0001      Neck Exercises: Seated   Cervical Rotation 5 reps  2 sets   Cervical Rotation Limitations ROM increased     Shoulder Exercises: Seated   Theraband Level (Shoulder Row) Level 1 (Yellow)  Row Limitations 2x10   External Rotation Limitations supine with yellow band 2x10      Moist Heat Therapy   Number Minutes Moist Heat 15 Minutes   Moist Heat Location Cervical;Shoulder     Manual Therapy   Manual therapy comments sub-occipitla release to cervical spine; upper trap trigger point relelase right side; light manual traction; Mulligan mSNAG in seated for right rotation    tissue softened.  Light pressure only,                  PT Education - 04/04/16 1357    Education provided Yes   Education Details improtance of ther-ex for the neck.    Person(s) Educated Patient   Methods Explanation   Comprehension Verbalized understanding;Returned demonstration;Verbal cues required          PT Short Term Goals - 04/04/16 1518      PT SHORT TERM GOAL #1   Title -Pt will increase cervical rotation by 25 degrees bilateral    Baseline varies.   Time 4   Period Weeks   Status On-going     PT SHORT TERM GOAL  #2   Title Patient will demsotrate full right active shoulder flexion without pain    Baseline 102 , moderate pain   Time 4   Period Weeks   Status On-going     PT SHORT TERM GOAL #3   Title Patient will demostrate 4/5 right shoulder flexion    Time 4   Period Weeks   Status Unable to assess     PT SHORT TERM GOAL #4   Title Patient will report decreased tenderness to palpation int he right upper trap    Baseline tender   Time 4   Period Weeks   Status On-going     PT SHORT TERM GOAL #5   Title Patient will be independent with initial HEP    Baseline needs cues   Time 4   Period Weeks   Status On-going           PT Long Term Goals - 03/11/16 1217      PT LONG TERM GOAL #1   Title Patient will increase bilateral cervical rotation to 65 degrees bilateral in order to increase safety when driving   Time 8   Period Weeks   Status New     PT LONG TERM GOAL #2   Title Patient will reach into a cabinet with right shoulder without increased pain    Time 8   Period Weeks   Status New     PT LONG TERM GOAL #3   Title Patient will sleep at night without cervical spine pain    Time 8   Period Weeks   Status New               Plan - 04/04/16 1516    Clinical Impression Statement Patient's sspamsing has improved. she had improved rotation with mulligan mobilization for right rotation. She feels like she is progressing well She wis using her right upper extremity without much pain.    Rehab Potential Good   PT Frequency 2x / week   PT Duration 8 weeks   PT Treatment/Interventions ADLs/Self Care Home Management;Dry needling;Taping;Passive range of motion;Patient/family education;Neuromuscular re-education;Therapeutic exercise;Therapeutic activities;Electrical Stimulation;Iontophoresis 4mg /ml Dexamethasone;Cryotherapy;Moist Heat;Ultrasound;Traction;Manual techniques   PT Next Visit Plan Build HEP.  MMT   PT Home Exercise Plan shoulder flexion w/ wand, cervical  rotation ,  03/21/2016 rows seated with yellow band.  Consulted and Agree with Plan of Care Patient      Patient will benefit from skilled therapeutic intervention in order to improve the following deficits and impairments:  Decreased activity tolerance, Decreased strength, Decreased mobility, Increased muscle spasms, Pain, Decreased range of motion, Increased fascial restricitons  Visit Diagnosis: Cervicalgia  Muscle weakness (generalized)  Acute pain of right shoulder     Problem List Patient Active Problem List   Diagnosis Date Noted  . Retained metal fragment left scapula 12/26/2015  . Fracture of glenoid process of left scapula 12/29/2014  . Status post total shoulder arthroplasty 12/27/2014  . Cocaine dependence in remission ([place]) 09/29/2014  . Severe heroin dependence in sustained remission ([place]) 09/29/2014  . Opiate abuse, episodic 09/29/2014  . Mild tetrahydrocannabinol (THC) abuse 09/29/2014  . Adjustment disorder with depressed mood 09/29/2014  . Chronic pain syndrome 04/18/2014  . Primary osteoarthritis of left shoulder 04/18/2014  . Primary osteoarthritis of right knee 04/18/2014  . Primary osteoarthritis of left knee 04/18/2014  . ANXIETY 03/22/2010  . HYPERTENSION, BENIGN ESSENTIAL 03/22/2010  . ASTHMA 03/22/2010  . CONSTIPATION 03/22/2010  . HEPATITIS C 06/04/1995    Carney Living PT DPT  04/04/2016, 3:25 PM  [facility] [address] [place], [place], [address] Phone: [phone]   Fax:  [phone]  Name: Ashley Riddle MRN: [id number] Date of Birth: 10/27/1945

## 2016-04-08 ENCOUNTER — Ambulatory Visit: Payer: Medicare HMO | Admitting: Physical Therapy

## 2016-04-10 ENCOUNTER — Ambulatory Visit: Payer: Medicare HMO | Admitting: Physical Therapy

## 2016-04-10 DIAGNOSIS — M2560 Stiffness of unspecified joint, not elsewhere classified: Secondary | ICD-10-CM

## 2016-04-10 DIAGNOSIS — M25511 Pain in right shoulder: Secondary | ICD-10-CM

## 2016-04-10 DIAGNOSIS — R531 Weakness: Secondary | ICD-10-CM

## 2016-04-10 DIAGNOSIS — M6281 Muscle weakness (generalized): Secondary | ICD-10-CM

## 2016-04-10 DIAGNOSIS — M542 Cervicalgia: Secondary | ICD-10-CM | POA: Diagnosis not present

## 2016-04-10 DIAGNOSIS — IMO0002 Reserved for concepts with insufficient information to code with codable children: Secondary | ICD-10-CM

## 2016-04-10 NOTE — Therapy (Signed)
[place], [place], [address] Phone: [phone]   Fax:  [phone]  Physical Therapy Treatment  Patient Details  Name: Ashley Riddle MRN: [id number] Date of Birth: 1946-02-13 Referring Provider: Dr Joni Fears   Encounter Date: 04/10/2016      PT End of Session - 04/10/16 1323    Visit Number 8   Number of Visits 16   Date for PT Re-Evaluation 05/06/16   Authorization Type [employer] medicare complete    PT Start Time 1018   PT Stop Time 1110   PT Time Calculation (min) 52 min   Activity Tolerance Patient tolerated treatment well   Behavior During Therapy [facility] for tasks assessed/performed      Past Medical History:  Diagnosis Date  . Anxiety   . Arthritis   . Constipation   . DDD (degenerative disc disease), cervical   . DDD (degenerative disc disease), lumbar   . Gout   . Heart murmur    probable bicuspid AV with mild AS, mild MR by 04/22/14 Echo (Dr. Montez Morita)  . Hepatitis C    treated 2016   . History of blood transfusion   . HTN (hypertension)   . Hypertension   . Pneumonia 12/02/13    Past Surgical History:  Procedure Laterality Date  . ABDOMINAL HYSTERECTOMY    . APPENDECTOMY    . BACK SURGERY    . CESAREAN SECTION     x 3  . COLONOSCOPY W/ POLYPECTOMY    . HARDWARE REMOVAL Left 12/26/2015   Procedure: REMOVAL K-WIRE LEFT SCAPULA;  Surgeon: Garald Balding, MD;  Location: [place];  Service: Orthopedics;  Laterality: Left;  . INNER EAR SURGERY     blood vessel  . TONSILLECTOMY    . TOTAL SHOULDER ARTHROPLASTY Left 12/27/2014   Procedure: TOTAL SHOULDER ARTHROPLASTY;  Surgeon: Garald Balding, MD;  Location: [place];  Service: Orthopedics;  Laterality: Left;    There were no vitals filed for this visit.      Subjective Assessment - 04/10/16 1321    Subjective Patient reports at times she is still having a catch when she turns to the right but overall she is doing much  better. She is having bvery little pain in her neck. She has been usingher right arm more as time goes on.    Limitations Lifting   How long can you sit comfortably? No limit    How long can you stand comfortably? No limit    How long can you walk comfortably? No limit    Diagnostic tests Cervical CT: Cervical spondylosis No fracture   Patient Stated Goals To have less pain in her neck   Currently in Pain? Yes   Pain Score 1    Pain Location Neck   Pain Orientation Right   Pain Descriptors / Indicators Aching   Pain Type Acute pain   Pain Radiating Towards No    Pain Onset More than a month ago   Pain Frequency Intermittent   Aggravating Factors  turning head    Pain Relieving Factors Exercises    Effect of Pain on Daily Activities turning head to the right    Multiple Pain Sites No                         [facility] Adult PT Treatment/Exercise - 04/10/16 0001      Neck Exercises: Seated   Cervical Rotation 5 reps  2 sets   Cervical Rotation Limitations ROM increased     Shoulder Exercises: Seated   Retraction 20 reps   Theraband Level (Shoulder Retraction) Level 1 (Yellow)   Theraband Level (Shoulder Row) Level 1 (Yellow)   Row Limitations 2x10   External Rotation Limitations Seated with yellow band 2x10      Manual Therapy   Manual therapy comments sub-occipitla release to cervical spine; upper trap trigger point relelase right side; light manual traction; Mulligan SNAG in seated for right rotation    tissue softened.  Light pressure only,                  PT Education - 04/10/16 1323    Education provided Yes   Education Details importance of ther-ex    Person(s) Educated Patient   Methods Explanation   Comprehension Verbalized understanding;Returned demonstration;Verbal cues required          PT Short Term Goals - 04/04/16 1518      PT SHORT TERM GOAL #1   Title -Pt will increase cervical rotation by 25 degrees bilateral    Baseline  varies.   Time 4   Period Weeks   Status On-going     PT SHORT TERM GOAL #2   Title Patient will demsotrate full right active shoulder flexion without pain    Baseline 102 , moderate pain   Time 4   Period Weeks   Status On-going     PT SHORT TERM GOAL #3   Title Patient will demostrate 4/5 right shoulder flexion    Time 4   Period Weeks   Status Unable to assess     PT SHORT TERM GOAL #4   Title Patient will report decreased tenderness to palpation int he right upper trap    Baseline tender   Time 4   Period Weeks   Status On-going     PT SHORT TERM GOAL #5   Title Patient will be independent with initial HEP    Baseline needs cues   Time 4   Period Weeks   Status On-going           PT Long Term Goals - 03/11/16 1217      PT LONG TERM GOAL #1   Title Patient will increase bilateral cervical rotation to 65 degrees bilateral in order to increase safety when driving   Time 8   Period Weeks   Status New     PT LONG TERM GOAL #2   Title Patient will reach into a cabinet with right shoulder without increased pain    Time 8   Period Weeks   Status New     PT LONG TERM GOAL #3   Title Patient will sleep at night without cervical spine pain    Time 8   Period Weeks   Status New               Plan - 04/10/16 1327    Clinical Impression Statement Patient continues to reported functional improvements. She has 1 more treatment then she will likley D/C. She still has some spasming in the right upper trap.    Rehab Potential Good   PT Frequency 2x / week   PT Duration 8 weeks   PT Treatment/Interventions ADLs/Self Care Home Management;Dry needling;Taping;Passive range of motion;Patient/family education;Neuromuscular re-education;Therapeutic exercise;Therapeutic activities;Electrical Stimulation;Iontophoresis 4mg /ml Dexamethasone;Cryotherapy;Moist Heat;Ultrasound;Traction;Manual techniques   PT Next Visit Plan review HEP. D/C    PT Home Exercise Plan shoulder  flexion w/  wand, cervical rotation ,  03/21/2016 rows seated with yellow band.    Consulted and Agree with Plan of Care Patient      Patient will benefit from skilled therapeutic intervention in order to improve the following deficits and impairments:  Decreased activity tolerance, Decreased strength, Decreased mobility, Increased muscle spasms, Pain, Decreased range of motion, Increased fascial restricitons  Visit Diagnosis: Cervicalgia  Muscle weakness (generalized)  Acute pain of right shoulder  Stiffness of extremity  Weakness     Problem List Patient Active Problem List   Diagnosis Date Noted  . Retained metal fragment left scapula 12/26/2015  . Fracture of glenoid process of left scapula 12/29/2014  . Status post total shoulder arthroplasty 12/27/2014  . Cocaine dependence in remission ([place]) 09/29/2014  . Severe heroin dependence in sustained remission ([place]) 09/29/2014  . Opiate abuse, episodic 09/29/2014  . Mild tetrahydrocannabinol (THC) abuse 09/29/2014  . Adjustment disorder with depressed mood 09/29/2014  . Chronic pain syndrome 04/18/2014  . Primary osteoarthritis of left shoulder 04/18/2014  . Primary osteoarthritis of right knee 04/18/2014  . Primary osteoarthritis of left knee 04/18/2014  . ANXIETY 03/22/2010  . HYPERTENSION, BENIGN ESSENTIAL 03/22/2010  . ASTHMA 03/22/2010  . CONSTIPATION 03/22/2010  . HEPATITIS C 06/04/1995    Carney Living PT DPT  04/10/2016, 1:30 PM  [place], [place], [address] Phone: [phone]   Fax:  [phone]  Name: LATIA REINKING MRN: [id number] Date of Birth: [birth year]

## 2016-04-11 ENCOUNTER — Ambulatory Visit: Payer: Medicare HMO | Admitting: Physical Therapy

## 2016-04-11 DIAGNOSIS — M6281 Muscle weakness (generalized): Secondary | ICD-10-CM

## 2016-04-11 DIAGNOSIS — M542 Cervicalgia: Secondary | ICD-10-CM

## 2016-04-11 DIAGNOSIS — M25511 Pain in right shoulder: Secondary | ICD-10-CM

## 2016-04-11 NOTE — Therapy (Signed)
[place], [place], [address] Phone: [phone]   Fax:  [phone]  Physical Therapy Treatment/ Discharge   Patient Details  Name: Ashley Riddle MRN: [id number] Date of Birth: Oct 23, 1945 Referring Provider: Dr Joni Fears   Encounter Date: 04/11/2016      PT End of Session - 04/11/16 0855    Visit Number 9   Number of Visits 16   Date for PT Re-Evaluation 05/06/16   Authorization Type [employer] medicare complete    PT Start Time 0847   PT Stop Time 0937   PT Time Calculation (min) 50 min   Activity Tolerance Patient tolerated treatment well   Behavior During Therapy [facility] for tasks assessed/performed      Past Medical History:  Diagnosis Date  . Anxiety   . Arthritis   . Constipation   . DDD (degenerative disc disease), cervical   . DDD (degenerative disc disease), lumbar   . Gout   . Heart murmur    probable bicuspid AV with mild AS, mild MR by 04/22/14 Echo (Dr. Montez Morita)  . Hepatitis C    treated 2016   . History of blood transfusion   . HTN (hypertension)   . Hypertension   . Pneumonia 12/02/13    Past Surgical History:  Procedure Laterality Date  . ABDOMINAL HYSTERECTOMY    . APPENDECTOMY    . BACK SURGERY    . CESAREAN SECTION     x 3  . COLONOSCOPY W/ POLYPECTOMY    . HARDWARE REMOVAL Left 12/26/2015   Procedure: REMOVAL K-WIRE LEFT SCAPULA;  Surgeon: Garald Balding, MD;  Location: [place];  Service: Orthopedics;  Laterality: Left;  . INNER EAR SURGERY     blood vessel  . TONSILLECTOMY    . TOTAL SHOULDER ARTHROPLASTY Left 12/27/2014   Procedure: TOTAL SHOULDER ARTHROPLASTY;  Surgeon: Garald Balding, MD;  Location: [place];  Service: Orthopedics;  Laterality: Left;    There were no vitals filed for this visit.      Subjective Assessment - 04/11/16 0945    Subjective Patient reports improved pain after treatment yesterday. She feels confident that she can continue with  her exercises at home. She continues to have slight pain when turning to the right.    Limitations Lifting   How long can you sit comfortably? No limit    How long can you stand comfortably? No limit    How long can you walk comfortably? No limit    Diagnostic tests Cervical CT: Cervical spondylosis No fracture   Patient Stated Goals To have less pain in her neck   Currently in Pain? No/denies            [facility] PT Assessment - 04/11/16 0001      AROM   Cervical - Right Rotation 66    Cervical - Left Rotation 80     PROM   Overall PROM Comments 170 degrees without pain                              PT Education - 04/11/16 0946    Education provided Yes   Education Details reviewed final HEP with the patient    Person(s) Educated Patient   Methods Explanation   Comprehension Verbalized understanding;Returned demonstration          PT Short Term Goals - 04/11/16 0858      PT SHORT  TERM GOAL #1   Title -Pt will increase cervical rotation by 25 degrees bilateral    Baseline 78 to the left 68 ti thhe right with minor pain    Time 4   Period Weeks     PT SHORT TERM GOAL #2   Title Patient will demsotrate full right active shoulder flexion without pain    Baseline 168 degrees without pain   Time 4   Period Weeks   Status Achieved     PT SHORT TERM GOAL #3   Title Patient will demostrate 4/5 right shoulder flexion    Baseline 5/5 shoulder flexion    Time 4   Period Weeks   Status Achieved     PT SHORT TERM GOAL #4   Title Patient will report decreased tenderness to palpation int he right upper trap    Baseline still mild spasming but significant improvement    Time 4   Period Weeks   Status Achieved     PT SHORT TERM GOAL #5   Title Patient will be independent with initial HEP    Baseline has it on her refirgerator    Time 4   Period Weeks   Status Achieved           PT Long Term Goals - 04/11/16 3903      PT LONG TERM GOAL #1    Title Patient will increase bilateral cervical rotation to 65 degrees bilateral in order to increase safety when driving   Baseline 68 R 80 to the left    Time 8   Period Weeks   Status Achieved     PT LONG TERM GOAL #2   Title Patient will reach into a cabinet with right shoulder without increased pain    Baseline able to do without difficulty    Time 8   Period Weeks   Status Achieved     PT LONG TERM GOAL #3   Title Patient will sleep at night without cervical spine pain    Baseline sleeping better    Time 8   Period Weeks   Status Achieved       PHYSICAL THERAPY DISCHARGE SUMMARY  Visits from Start of Care: 9  Current functional level related to goals / functional outcomes: Patient able to use right shoulder for function without pain. Able to drive without straining her neck.    Remaining deficits: Minor pain with end range rotation    Education / Equipment: HEP  Plan: Patient agrees to discharge.  Patient goals were met. Patient is being discharged due to meeting the stated rehab goals.  ?????             Plan - 04/11/16 0955    Clinical Impression Statement Patient has reached all goals for therapy. She has slight pain with end range rotation to the right. She still has minor spasming of the upper trap but htat has improved as well. She has full functional use of her right arm. She feels comofrtable with her HEP at home. She is confident she can continue the gains she has made at home on her own. D/C to HEP.    Rehab Potential Good   PT Frequency 2x / week   PT Duration 8 weeks   PT Treatment/Interventions ADLs/Self Care Home Management;Dry needling;Taping;Passive range of motion;Patient/family education;Neuromuscular re-education;Therapeutic exercise;Therapeutic activities;Electrical Stimulation;Iontophoresis 58m/ml Dexamethasone;Cryotherapy;Moist Heat;Ultrasound;Traction;Manual techniques   PT Next Visit Plan D/C    PT Home Exercise Plan shoulder flexion  w/ wand,  cervical rotation ,  03/21/2016 rows seated with yellow band.    Consulted and Agree with Plan of Care Patient      Patient will benefit from skilled therapeutic intervention in order to improve the following deficits and impairments:  Decreased activity tolerance, Decreased strength, Decreased mobility, Increased muscle spasms, Pain, Decreased range of motion, Increased fascial restricitons  Visit Diagnosis: Cervicalgia  Muscle weakness (generalized)  Acute pain of right shoulder       G-Codes - 04/17/16 1001    Functional Assessment Tool Used Clinical decision making    Functional Limitation Carrying, moving and handling objects   Carrying, Moving and Handling Objects Current Status (N2355) At least 40 percent but less than 60 percent impaired, limited or restricted   Carrying, Moving and Handling Objects Goal Status (D3220) At least 1 percent but less than 20 percent impaired, limited or restricted   Carrying, Moving and Handling Objects Discharge Status [phone]) At least 1 percent but less than 20 percent impaired, limited or restricted      Problem List Patient Active Problem List   Diagnosis Date Noted  . Retained metal fragment left scapula 12/26/2015  . Fracture of glenoid process of left scapula 12/29/2014  . Status post total shoulder arthroplasty 12/27/2014  . Cocaine dependence in remission ([place]) 09/29/2014  . Severe heroin dependence in sustained remission ([place]) 09/29/2014  . Opiate abuse, episodic 09/29/2014  . Mild tetrahydrocannabinol (THC) abuse 09/29/2014  . Adjustment disorder with depressed mood 09/29/2014  . Chronic pain syndrome 04/18/2014  . Primary osteoarthritis of left shoulder 04/18/2014  . Primary osteoarthritis of right knee 04/18/2014  . Primary osteoarthritis of left knee 04/18/2014  . ANXIETY 03/22/2010  . HYPERTENSION, BENIGN ESSENTIAL 03/22/2010  . ASTHMA 03/22/2010  . CONSTIPATION 03/22/2010  . HEPATITIS C 06/04/1995    Carney Living Apr 17, 2016, 1:11 PM  [facility] [address] [place], [place], [address] Phone: [phone]   Fax:  [phone]  Name: JASMAIN AHLBERG MRN: [id number] Date of Birth: 1945-08-03

## 2016-05-18 ENCOUNTER — Emergency Department (HOSPITAL_COMMUNITY): Payer: Medicare HMO

## 2016-05-18 ENCOUNTER — Emergency Department (HOSPITAL_COMMUNITY)
Admission: EM | Admit: 2016-05-18 | Discharge: 2016-05-18 | Disposition: A | Discharge status: Home / Self Care | Payer: Medicare HMO | Attending: Emergency Medicine | Admitting: Emergency Medicine

## 2016-05-18 ENCOUNTER — Encounter (HOSPITAL_COMMUNITY): Payer: Self-pay | Admitting: Nurse Practitioner

## 2016-05-18 DIAGNOSIS — Z79899 Other long term (current) drug therapy: Secondary | ICD-10-CM | POA: Diagnosis not present

## 2016-05-18 DIAGNOSIS — R05 Cough: Secondary | ICD-10-CM | POA: Diagnosis present

## 2016-05-18 DIAGNOSIS — J111 Influenza due to unidentified influenza virus with other respiratory manifestations: Secondary | ICD-10-CM | POA: Diagnosis not present

## 2016-05-18 DIAGNOSIS — Z96612 Presence of left artificial shoulder joint: Secondary | ICD-10-CM | POA: Insufficient documentation

## 2016-05-18 DIAGNOSIS — I10 Essential (primary) hypertension: Secondary | ICD-10-CM | POA: Insufficient documentation

## 2016-05-18 DIAGNOSIS — Z87891 Personal history of nicotine dependence: Secondary | ICD-10-CM | POA: Insufficient documentation

## 2016-05-18 DIAGNOSIS — R69 Illness, unspecified: Secondary | ICD-10-CM

## 2016-05-18 MED ORDER — ALBUTEROL SULFATE HFA 108 (90 BASE) MCG/ACT IN AERS
2.0000 | INHALATION_SPRAY | Freq: Once | RESPIRATORY_TRACT | Status: AC
  Administered 2016-05-18: 2 via RESPIRATORY_TRACT
  Filled 2016-05-18: qty 6.7

## 2016-05-18 MED ORDER — KETOROLAC TROMETHAMINE 60 MG/2ML IM SOLN
30.0000 mg | Freq: Once | INTRAMUSCULAR | Status: AC
  Administered 2016-05-18: 30 mg via INTRAMUSCULAR
  Filled 2016-05-18: qty 2

## 2016-05-18 MED ORDER — OSELTAMIVIR PHOSPHATE 75 MG PO CAPS: 75.0000 mg | ORAL_CAPSULE | Freq: Two times a day (BID) | ORAL | 0 refills | Status: DC

## 2016-05-18 MED ORDER — ALBUTEROL SULFATE (2.5 MG/3ML) 0.083% IN NEBU
5.0000 mg | INHALATION_SOLUTION | Freq: Once | RESPIRATORY_TRACT | Status: AC
  Administered 2016-05-18: 5 mg via RESPIRATORY_TRACT
  Filled 2016-05-18: qty 6

## 2016-05-18 NOTE — ED Triage Notes (Signed)
Pt is c/o severe productive cough, chest burning with cough, body aches and chills, onset yesterday worsening today. Denies N/V/D.

## 2016-05-18 NOTE — ED Provider Notes (Signed)
[facility] DEPT Provider Note   CSN: [id number] Arrival date & time: 05/18/16  1928     History   Chief Complaint Chief Complaint  Patient presents with  . Cough  . Generalized Body Aches  . Chills    HPI BIRCHIE THUMM is a 70 y.o. female.  HPI ETHNA KONZ is a 70 y.o. female with history of anxiety, chronic pain, hypertension, hepatitis C,  presents to emergency department complaining of body aches, nasal congestion, sore throat, cough, onset yesterday evening. Patient states symptoms worsened throughout the day today. She has not taken any medications for this. She states she is unable to take Tylenol because of liver issues and unable to take Motrin because of history of acid reflux. She states she felt feverish earlier. She states that she has some nausea, denies vomiting. No diarrhea. No urinary symptoms. Has not taken anything for congestion. She has not had a flu shot this year. Her daughter is with the patient, states that she is concerned the patient may have the flu or pneumonia. Patient was wheezing upon arrival to ED was given a breathing treatment.  Past Medical History:  Diagnosis Date  . Anxiety   . Arthritis   . Constipation   . DDD (degenerative disc disease), cervical   . DDD (degenerative disc disease), lumbar   . Gout   . Heart murmur    probable bicuspid AV with mild AS, mild MR by 04/22/14 Echo (Dr. Montez Morita)  . Hepatitis C    treated 2016   . History of blood transfusion   . HTN (hypertension)   . Hypertension   . Pneumonia 12/02/13    Patient Active Problem List   Diagnosis Date Noted  . Retained metal fragment left scapula 12/26/2015  . Fracture of glenoid process of left scapula 12/29/2014  . Status post total shoulder arthroplasty 12/27/2014  . Cocaine dependence in remission ([place]) 09/29/2014  . Severe heroin dependence in sustained remission ([place]) 09/29/2014  . Opiate abuse, episodic 09/29/2014  . Mild tetrahydrocannabinol (THC)  abuse 09/29/2014  . Adjustment disorder with depressed mood 09/29/2014  . Chronic pain syndrome 04/18/2014  . Primary osteoarthritis of left shoulder 04/18/2014  . Primary osteoarthritis of right knee 04/18/2014  . Primary osteoarthritis of left knee 04/18/2014  . ANXIETY 03/22/2010  . HYPERTENSION, BENIGN ESSENTIAL 03/22/2010  . ASTHMA 03/22/2010  . CONSTIPATION 03/22/2010  . HEPATITIS C 06/04/1995    Past Surgical History:  Procedure Laterality Date  . ABDOMINAL HYSTERECTOMY    . APPENDECTOMY    . BACK SURGERY    . CESAREAN SECTION     x 3  . COLONOSCOPY W/ POLYPECTOMY    . HARDWARE REMOVAL Left 12/26/2015   Procedure: REMOVAL K-WIRE LEFT SCAPULA;  Surgeon: Garald Balding, MD;  Location: [facility];  Service: Orthopedics;  Laterality: Left;  . INNER EAR SURGERY     blood vessel  . TONSILLECTOMY    . TOTAL SHOULDER ARTHROPLASTY Left 12/27/2014   Procedure: TOTAL SHOULDER ARTHROPLASTY;  Surgeon: Garald Balding, MD;  Location: [facility];  Service: Orthopedics;  Laterality: Left;    OB History    No data available       Home Medications    Prior to Admission medications   Medication Sig Start Date End Date Taking? Authorizing Provider  albuterol (PROVENTIL HFA;VENTOLIN HFA) 108 (90 Base) MCG/ACT inhaler Inhale into the lungs every 6 (six) hours as needed for wheezing or shortness of breath.    Historical Provider,  MD  amLODipine (NORVASC) 5 MG tablet Take 1 tablet (5 mg total) by mouth daily. 05/29/12   Oswald Hillock, MD  colchicine 0.6 MG tablet Take 0.6 mg by mouth daily as needed (gout).     Historical Provider, MD  docusate sodium (COLACE) 100 MG capsule Take 100 mg by mouth daily as needed for mild constipation.     Historical Provider, MD  DULoxetine (CYMBALTA) 30 MG capsule Take 1 capsule (30 mg total) by mouth daily. 09/29/14 12/19/16  Charlcie Cradle, MD  HYDROcodone-acetaminophen (NORCO/VICODIN) 5-325 MG tablet Take 1-2 tablets by mouth every 4 (four) hours as needed.  01/24/16   Frederica Kuster, PA-C  Multiple Vitamin (MULTIVITAMIN WITH MINERALS) TABS tablet Take 1 tablet by mouth daily.    Historical Provider, MD  orphenadrine (NORFLEX) 100 MG tablet Take 1 tablet (100 mg total) by mouth 2 (two) times daily as needed for muscle spasms. 01/24/16   Alexandra M Law, PA-C  valsartan-hydrochlorothiazide (DIOVAN-HCT) 320-12.5 MG tablet Take 1 tablet by mouth daily. 12/11/15   Historical Provider, MD    Family History Family History  Problem Relation Age of Onset  . Heart disease Mother   . Kidney disease Mother   . Alcohol abuse Mother     Social History Social History  Substance Use Topics  . Smoking status: Former Smoker    Years: 49.00  . Smokeless tobacco: Never Used     Comment: quit 2007  . Alcohol use No     Allergies   Penicillins   Review of Systems Review of Systems  Constitutional: Positive for chills and fever.  HENT: Positive for congestion and sore throat. Negative for ear pain.   Respiratory: Positive for cough. Negative for chest tightness and shortness of breath.   Cardiovascular: Negative for chest pain, palpitations and leg swelling.  Gastrointestinal: Negative for abdominal pain, diarrhea, nausea and vomiting.  Genitourinary: Negative for dysuria, flank pain and pelvic pain.  Musculoskeletal: Positive for arthralgias and myalgias. Negative for neck pain and neck stiffness.  Skin: Negative for rash.  Neurological: Positive for headaches. Negative for dizziness and weakness.  All other systems reviewed and are negative.    Physical Exam Updated Vital Signs BP 143/77 (BP Location: Left Arm)   Pulse 73   Temp 99.2 F (37.3 C) (Oral)   Resp 18   SpO2 97%   Physical Exam  Constitutional: She appears well-developed and well-nourished. No distress.  HENT:  Head: Normocephalic.  Right Ear: Tympanic membrane, external ear and ear canal normal.  Left Ear: Tympanic membrane, external ear and ear canal normal.  Nose:  Mucosal edema and rhinorrhea present.  Mouth/Throat: Uvula is midline, oropharynx is clear and moist and mucous membranes are normal.  Eyes: Conjunctivae are normal.  Neck: Neck supple.  Cardiovascular: Normal rate, regular rhythm and normal heart sounds.   Pulmonary/Chest: Effort normal and breath sounds normal. No respiratory distress. She has no wheezes. She has no rales.  Abdominal: Soft. Bowel sounds are normal. She exhibits no distension. There is no tenderness. There is no rebound.  Musculoskeletal: She exhibits no edema.  Neurological: She is alert.  Skin: Skin is warm and dry.  Psychiatric: She has a normal mood and affect. Her behavior is normal.  Nursing note and vitals reviewed.    ED Treatments / Results  Labs (all labs ordered are listed, but only abnormal results are displayed) Labs Reviewed - No data to display  EKG  EKG Interpretation None  Radiology Dg Chest 2 View  Result Date: 05/18/2016 CLINICAL DATA:  70 y/o F; severe productive cough with chest burning, body aches, and chills. EXAM: CHEST  2 VIEW COMPARISON:  01/24/2016 and 05/22/2005 chest radiograph FINDINGS: Stable cardiac silhouette within normal limits. Aortic atherosclerosis with calcification. Right mid lung zone stable calcified granuloma. No focal consolidation. No pleural effusion. Left humeral head arthroplasty noted. Moderate degenerative changes of the thoracic spine. IMPRESSION: No acute pulmonary process. Aortic atherosclerosis. Stable cardiac silhouette. Electronically Signed   By: Kristine Garbe M.D.   On: 05/18/2016 20:53    Procedures Procedures (including critical care time)  Medications Ordered in ED Medications  ketorolac (TORADOL) injection 30 mg (not administered)  albuterol (PROVENTIL HFA;VENTOLIN HFA) 108 (90 Base) MCG/ACT inhaler 2 puff (not administered)  albuterol (PROVENTIL) (2.5 MG/3ML) 0.083% nebulizer solution 5 mg (5 mg Nebulization Given 05/18/16 2045)       Initial Impression / Assessment and Plan / ED Course  I have reviewed the triage vital signs and the nursing notes.  Pertinent labs & imaging results that were available during my care of the patient were reviewed by me and considered in my medical decision making (see chart for details).  Clinical Course     Patient in emergency department with flulike symptoms onset yesterday. Temperature in emergency department is 99.2 orally. She reports subjective fever and chills at home. She reports diffuse body aches, keeps moaning "my whole body hurts." She reports nasal congestion, sore throat, cough. Chest x-ray was obtained at triage and is negative for acute pulmonary process. She is nontoxic during exam. Vital signs are reassuring, not tachycardic, not tachypneic, not hypertensive, maintaining oxygen saturation above 90% on room air, no evidence of sepsis. Lungs are clear, patient received one breathing treatment prior to me seeing her. Apparently with some wheezing on initial evaluation. No evidence of peritonsillar or retropharyngeal abscess on throat exam. Patient states " I just want to go home." We'll give a shot of Toradol, 30 mg IM, for body aches and fever, patient refusing Tylenol and Motrin. Will give inhaler to go home with. Daughter requests Tamiflu, will give prescription. We'll have her follow-up with family doctor. Return precautions discussed.   Vitals:   05/18/16 1939  BP: 143/77  Pulse: 73  Resp: 18  Temp: 99.2 F (37.3 C)  TempSrc: Oral  SpO2: 97%     Final Clinical Impressions(s) / ED Diagnoses   Final diagnoses:  Influenza-like illness    New Prescriptions New Prescriptions   No medications on file     Janee Morn 05/18/16 2204    Julianne Rice, MD 05/18/16 2356

## 2016-05-18 NOTE — Discharge Instructions (Signed)
Although we did not test you, you may have the flu or another viral upper respiratory tract infection. Make sure to rest, drink plenty of fluids, inhaler 2 puffs every 4 hrs. Tamiflu as prescribed until all gone. Follow up closely with your doctor in 2 days. Return if worsening.

## 2016-05-20 ENCOUNTER — Emergency Department (HOSPITAL_COMMUNITY): Payer: Medicare HMO

## 2016-05-20 ENCOUNTER — Emergency Department (HOSPITAL_COMMUNITY)
Admission: EM | Admit: 2016-05-20 | Discharge: 2016-05-20 | Disposition: A | Discharge status: Home / Self Care | Payer: Medicare HMO | Attending: Emergency Medicine | Admitting: Emergency Medicine

## 2016-05-20 ENCOUNTER — Encounter (HOSPITAL_COMMUNITY): Payer: Self-pay | Admitting: Emergency Medicine

## 2016-05-20 DIAGNOSIS — J111 Influenza due to unidentified influenza virus with other respiratory manifestations: Secondary | ICD-10-CM

## 2016-05-20 DIAGNOSIS — R52 Pain, unspecified: Secondary | ICD-10-CM | POA: Insufficient documentation

## 2016-05-20 DIAGNOSIS — J45909 Unspecified asthma, uncomplicated: Secondary | ICD-10-CM | POA: Diagnosis not present

## 2016-05-20 DIAGNOSIS — R0981 Nasal congestion: Secondary | ICD-10-CM | POA: Insufficient documentation

## 2016-05-20 DIAGNOSIS — I10 Essential (primary) hypertension: Secondary | ICD-10-CM | POA: Insufficient documentation

## 2016-05-20 DIAGNOSIS — R05 Cough: Secondary | ICD-10-CM | POA: Diagnosis not present

## 2016-05-20 DIAGNOSIS — Z87891 Personal history of nicotine dependence: Secondary | ICD-10-CM | POA: Diagnosis not present

## 2016-05-20 DIAGNOSIS — R69 Illness, unspecified: Secondary | ICD-10-CM

## 2016-05-20 DIAGNOSIS — J029 Acute pharyngitis, unspecified: Secondary | ICD-10-CM | POA: Diagnosis not present

## 2016-05-20 DIAGNOSIS — Z96612 Presence of left artificial shoulder joint: Secondary | ICD-10-CM | POA: Insufficient documentation

## 2016-05-20 LAB — CBC WITH DIFFERENTIAL/PLATELET
Basophils Absolute: 0 10*3/uL (ref 0.0–0.1)
Basophils Relative: 0 %
EOS ABS: 0 10*3/uL (ref 0.0–0.7)
EOS PCT: 0 %
HCT: 38.5 % (ref 36.0–46.0)
Hemoglobin: 13.2 g/dL (ref 12.0–15.0)
LYMPHS ABS: 0.8 10*3/uL (ref 0.7–4.0)
LYMPHS PCT: 15 %
MCH: 28.5 pg (ref 26.0–34.0)
MCHC: 34.3 g/dL (ref 30.0–36.0)
MCV: 83.2 fL (ref 78.0–100.0)
MONO ABS: 0.6 10*3/uL (ref 0.1–1.0)
Monocytes Relative: 12 %
Neutro Abs: 3.7 10*3/uL (ref 1.7–7.7)
Neutrophils Relative %: 73 %
PLATELETS: 115 10*3/uL — AB (ref 150–400)
RBC: 4.63 MIL/uL (ref 3.87–5.11)
RDW: 12.4 % (ref 11.5–15.5)
WBC: 5.1 10*3/uL (ref 4.0–10.5)

## 2016-05-20 LAB — COMPREHENSIVE METABOLIC PANEL
ALBUMIN: 4.5 g/dL (ref 3.5–5.0)
ALT: 17 U/L (ref 14–54)
AST: 29 U/L (ref 15–41)
Alkaline Phosphatase: 80 U/L (ref 38–126)
Anion gap: 8 (ref 5–15)
BILIRUBIN TOTAL: 0.7 mg/dL (ref 0.3–1.2)
BUN: 9 mg/dL (ref 6–20)
CHLORIDE: 101 mmol/L (ref 101–111)
CO2: 27 mmol/L (ref 22–32)
CREATININE: 0.61 mg/dL (ref 0.44–1.00)
Calcium: 9.6 mg/dL (ref 8.9–10.3)
GFR calc Af Amer: >60 mL/min (ref >60)
GFR calc non Af Amer: >60 mL/min (ref >60)
GLUCOSE: 127 mg/dL — AB (ref 65–99)
POTASSIUM: 3.7 mmol/L (ref 3.5–5.1)
Sodium: 136 mmol/L (ref 135–145)
TOTAL PROTEIN: 8.2 g/dL — AB (ref 6.5–8.1)

## 2016-05-20 MED ORDER — SODIUM CHLORIDE 0.9 % IV BOLUS (SEPSIS)
1000.0000 mL | Freq: Once | INTRAVENOUS | Status: AC
  Administered 2016-05-20: 1000 mL via INTRAVENOUS

## 2016-05-20 MED ORDER — ACETAMINOPHEN 325 MG PO TABS
650.0000 mg | ORAL_TABLET | Freq: Once | ORAL | Status: AC
  Administered 2016-05-20: 650 mg via ORAL
  Filled 2016-05-20: qty 2

## 2016-05-20 MED ORDER — ONDANSETRON 4 MG PO TBDP: 4.0000 mg | ORAL_TABLET | Freq: Three times a day (TID) | ORAL | 0 refills | Status: DC | PRN

## 2016-05-20 MED ORDER — KETOROLAC TROMETHAMINE 15 MG/ML IJ SOLN
15.0000 mg | Freq: Once | INTRAMUSCULAR | Status: AC
  Administered 2016-05-20: 15 mg via INTRAVENOUS
  Filled 2016-05-20: qty 1

## 2016-05-20 MED ORDER — ACETAMINOPHEN ER 650 MG PO TBCR: 650.0000 mg | EXTENDED_RELEASE_TABLET | Freq: Three times a day (TID) | ORAL | 0 refills | Status: DC | PRN

## 2016-05-20 MED ORDER — ONDANSETRON HCL 4 MG/2ML IJ SOLN
4.0000 mg | Freq: Once | INTRAMUSCULAR | Status: AC
  Administered 2016-05-20: 4 mg via INTRAVENOUS
  Filled 2016-05-20: qty 2

## 2016-05-20 NOTE — ED Notes (Signed)
Pt given sprite per request 

## 2016-05-20 NOTE — ED Triage Notes (Signed)
Patient c/o generalized body aches, ear pain, sore throat  since Saturday.  Patient was seen on Saturday for same thing and states that she got a shot and inhaler.  patient denies n/v/d or fever.

## 2016-05-20 NOTE — ED Provider Notes (Signed)
[facility] DEPT Provider Note   CSN: [id number] Arrival date & time: 05/20/16  [id number]     History   Chief Complaint Chief Complaint  Patient presents with  . Generalized Body Aches  . Otalgia  . Sore Throat    HPI Ashley Riddle is a 70 y.o. female.  HPI  Pt comes in with cc of body aches, earaches, sore throat, nasal congestion, cough. Pt's symptoms started on Friday, and she was seen in the ER- started on Tamiflu. Pt reports that her symptoms of malaise have continued, and she has constant body aches. Pt has no emesis or diarrhea. PO intake has gone down  Dramatically as she has no appetite. Pt is having fevers, sweats. No sick contacts. No chest pain, dib. + weakness and nasal congestion and ear aches bilaterally.   Past Medical History:  Diagnosis Date  . Anxiety   . Arthritis   . Constipation   . DDD (degenerative disc disease), cervical   . DDD (degenerative disc disease), lumbar   . Gout   . Heart murmur    probable bicuspid AV with mild AS, mild MR by 04/22/14 Echo (Dr. Montez Morita)  . Hepatitis C    treated 2016   . History of blood transfusion   . HTN (hypertension)   . Hypertension   . Pneumonia 12/02/13    Patient Active Problem List   Diagnosis Date Noted  . Retained metal fragment left scapula 12/26/2015  . Fracture of glenoid process of left scapula 12/29/2014  . Status post total shoulder arthroplasty 12/27/2014  . Cocaine dependence in remission ([place]) 09/29/2014  . Severe heroin dependence in sustained remission ([place]) 09/29/2014  . Opiate abuse, episodic 09/29/2014  . Mild tetrahydrocannabinol (THC) abuse 09/29/2014  . Adjustment disorder with depressed mood 09/29/2014  . Chronic pain syndrome 04/18/2014  . Primary osteoarthritis of left shoulder 04/18/2014  . Primary osteoarthritis of right knee 04/18/2014  . Primary osteoarthritis of left knee 04/18/2014  . ANXIETY 03/22/2010  . HYPERTENSION, BENIGN ESSENTIAL 03/22/2010  . ASTHMA  03/22/2010  . CONSTIPATION 03/22/2010  . HEPATITIS C 06/04/1995    Past Surgical History:  Procedure Laterality Date  . ABDOMINAL HYSTERECTOMY    . APPENDECTOMY    . BACK SURGERY    . CESAREAN SECTION     x 3  . COLONOSCOPY W/ POLYPECTOMY    . HARDWARE REMOVAL Left 12/26/2015   Procedure: REMOVAL K-WIRE LEFT SCAPULA;  Surgeon: Garald Balding, MD;  Location: [facility];  Service: Orthopedics;  Laterality: Left;  . INNER EAR SURGERY     blood vessel  . TONSILLECTOMY    . TOTAL SHOULDER ARTHROPLASTY Left 12/27/2014   Procedure: TOTAL SHOULDER ARTHROPLASTY;  Surgeon: Garald Balding, MD;  Location: [facility];  Service: Orthopedics;  Laterality: Left;    OB History    No data available       Home Medications    Prior to Admission medications   Medication Sig Start Date End Date Taking? Authorizing Provider  albuterol (PROVENTIL HFA;VENTOLIN HFA) 108 (90 Base) MCG/ACT inhaler Inhale into the lungs every 6 (six) hours as needed for wheezing or shortness of breath.   Yes Historical Provider, MD  amLODipine (NORVASC) 5 MG tablet Take 1 tablet (5 mg total) by mouth daily. 05/29/12  Yes Oswald Hillock, MD  colchicine 0.6 MG tablet Take 0.6 mg by mouth daily as needed (gout).    Yes Historical Provider, MD  docusate sodium (COLACE) 100 MG capsule Take 100  mg by mouth daily as needed for mild constipation.    Yes Historical Provider, MD  DULoxetine (CYMBALTA) 30 MG capsule Take 1 capsule (30 mg total) by mouth daily. 09/29/14 12/19/16 Yes Charlcie Cradle, MD  HYDROcodone-acetaminophen (NORCO/VICODIN) 5-325 MG tablet Take 1-2 tablets by mouth every 4 (four) hours as needed. 01/24/16  Yes Alexandra M Law, PA-C  Multiple Vitamin (MULTIVITAMIN WITH MINERALS) TABS tablet Take 1 tablet by mouth daily.   Yes Historical Provider, MD  orphenadrine (NORFLEX) 100 MG tablet Take 1 tablet (100 mg total) by mouth 2 (two) times daily as needed for muscle spasms. 01/24/16  Yes Alexandra M Law, PA-C  oseltamivir  (TAMIFLU) 75 MG capsule Take 1 capsule (75 mg total) by mouth every 12 (twelve) hours. 05/18/16  Yes Tatyana Kirichenko, PA-C  valsartan-hydrochlorothiazide (DIOVAN-HCT) 320-12.5 MG tablet Take 1 tablet by mouth daily. 12/11/15  Yes Historical Provider, MD  acetaminophen (TYLENOL 8 HOUR) 650 MG CR tablet Take 1 tablet (650 mg total) by mouth every 8 (eight) hours as needed for pain. 05/20/16   Varney Biles, MD  ondansetron (ZOFRAN ODT) 4 MG disintegrating tablet Take 1 tablet (4 mg total) by mouth every 8 (eight) hours as needed for nausea or vomiting. 05/20/16   Varney Biles, MD    Family History Family History  Problem Relation Age of Onset  . Heart disease Mother   . Kidney disease Mother   . Alcohol abuse Mother     Social History Social History  Substance Use Topics  . Smoking status: Former Smoker    Years: 49.00  . Smokeless tobacco: Never Used     Comment: quit 2007  . Alcohol use No     Allergies   Penicillins   Review of Systems Review of Systems  ROS 10 Systems reviewed and are negative for acute change except as noted in the HPI.     Physical Exam Updated Vital Signs BP 162/86 (BP Location: Left Arm)   Pulse 88   Temp 99.4 F (37.4 C) (Oral)   Resp 18   SpO2 95%   Physical Exam  Constitutional: She is oriented to person, place, and time. She appears well-developed and well-nourished.  HENT:  Head: Normocephalic and atraumatic.  Mouth/Throat: No oropharyngeal exudate.  Eyes: EOM are normal. Pupils are equal, round, and reactive to light.  Neck: Neck supple.  Cardiovascular: Normal rate, regular rhythm and normal heart sounds.   No murmur heard. Pulmonary/Chest: Effort normal. No respiratory distress. She has no wheezes.  Abdominal: Soft. She exhibits no distension. There is no tenderness. There is no rebound and no guarding.  Neurological: She is alert and oriented to person, place, and time.  Skin: Skin is warm and dry.  Nursing note and  vitals reviewed.    ED Treatments / Results  Labs (all labs ordered are listed, but only abnormal results are displayed) Labs Reviewed  COMPREHENSIVE METABOLIC PANEL - Abnormal; Notable for the following:       Result Value   Glucose, Bld 127 (*)    Total Protein 8.2 (*)    All other components within normal limits  CBC WITH DIFFERENTIAL/PLATELET - Abnormal; Notable for the following:    Platelets 115 (*)    All other components within normal limits    EKG  EKG Interpretation None       Radiology Dg Chest 2 View  Result Date: 05/20/2016 CLINICAL DATA:  Cough with sore throat EXAM: CHEST  2 VIEW COMPARISON:  May 18, 2016  FINDINGS: There is a calcified granuloma in the right mid lung. Lungs elsewhere are clear. Heart size and pulmonary vascularity are normal. No adenopathy. There is atherosclerotic calcification in the aorta. Patient is status post total shoulder replacement on the left. IMPRESSION: No edema or consolidation. Calcified granuloma right mid lung. Aortic atherosclerosis. Electronically Signed   By: Lowella Grip III M.D.   On: 05/20/2016 11:37    Procedures Procedures (including critical care time)  Medications Ordered in ED Medications  sodium chloride 0.9 % bolus 1,000 mL (0 mLs Intravenous Stopped 05/20/16 0907)  ketorolac (TORADOL) 15 MG/ML injection 15 mg (15 mg Intravenous Given 05/20/16 0814)  ondansetron (ZOFRAN) injection 4 mg (4 mg Intravenous Given 05/20/16 0814)  acetaminophen (TYLENOL) tablet 650 mg (650 mg Oral Given 05/20/16 1102)     Initial Impression / Assessment and Plan / ED Course  I have reviewed the triage vital signs and the nursing notes.  Pertinent labs & imaging results that were available during my care of the patient were reviewed by me and considered in my medical decision making (see chart for details).  Clinical Course as of May 21 752  Mon May 20, 2016  1058 Pt's labs are reassuring. She now has a fever. PT's  symptoms are consistent with flu. Pt's symptoms started > 2 days ago. Pt is reliable. She is not in extremis. Strict ER return precautions have been discussed, and patient + daughter are agreeing with the plan and is comfortable with the workup done and the recommendations from the ER. Pt advised to push more fluids.   [AN]  1304 Pt developed a fever. Daughter wanted to get a CXR, since she is going to [facility] and wants to ensure mother is fine. CXR is normal. D./c now. Strict ER return precautions have been discussed.   [AN]    Clinical Course User Index [AN] Varney Biles, MD    Pt comes in with flu like symptoms. Already on tami flu. VSS and WNL. Pt's sx are typical of viral infection. We will get basic labs to check lytes, given reduced po intake and hydrate.   Platelets slightly low - likely due to viral infection.  Final Clinical Impressions(s) / ED Diagnoses   Final diagnoses:  Influenza-like illness    New Prescriptions Discharge Medication List as of 05/20/2016 10:57 AM    START taking these medications   Details  acetaminophen (TYLENOL 8 HOUR) 650 MG CR tablet Take 1 tablet (650 mg total) by mouth every 8 (eight) hours as needed for pain., Starting Mon 05/20/2016, Print    ondansetron (ZOFRAN ODT) 4 MG disintegrating tablet Take 1 tablet (4 mg total) by mouth every 8 (eight) hours as needed for nausea or vomiting., Starting Mon 05/20/2016, Print         Varney Biles, MD 05/21/16 [phone]

## 2016-05-20 NOTE — ED Notes (Signed)
[facility] EDP made aware re pt's abd pain

## 2016-05-20 NOTE — ED Notes (Signed)
Bed: WA22 Expected date:  Expected time:  Means of arrival:  Comments: 

## 2016-05-20 NOTE — ED Notes (Signed)
With trending temperature plan to DG chest prior to discharge per [facility]. Family updated.

## 2016-05-20 NOTE — Discharge Instructions (Signed)
°  We think what you have is flu or flu like illness - the treatment for which is symptomatic relief only, and your body will fight the infection off in a few days. We are prescribing you some meds for pain and fevers. See your primary care doctor in 1 week if the symptoms dont improve.  Please return to the ER if your symptoms worsen; you have increased pain, fevers, chills, inability to keep any medications down, confusion. Otherwise see the outpatient doctor as requested.

## 2016-06-24 ENCOUNTER — Other Ambulatory Visit: Payer: Self-pay | Admitting: Nurse Practitioner

## 2016-06-24 DIAGNOSIS — K7469 Other cirrhosis of liver: Secondary | ICD-10-CM

## 2016-07-03 ENCOUNTER — Ambulatory Visit
Admission: RE | Admit: 2016-07-03 | Discharge: 2016-07-03 | Disposition: A | Discharge status: Home / Self Care | Payer: Medicare HMO | Source: Ambulatory Visit | Attending: Nurse Practitioner | Admitting: Nurse Practitioner

## 2016-07-03 DIAGNOSIS — K7469 Other cirrhosis of liver: Secondary | ICD-10-CM

## 2016-07-17 ENCOUNTER — Other Ambulatory Visit: Payer: Self-pay | Admitting: Cardiology

## 2016-07-17 DIAGNOSIS — Z1231 Encounter for screening mammogram for malignant neoplasm of breast: Secondary | ICD-10-CM

## 2016-08-02 ENCOUNTER — Ambulatory Visit: Payer: Self-pay

## 2016-08-30 ENCOUNTER — Ambulatory Visit
Admission: RE | Admit: 2016-08-30 | Discharge: 2016-08-30 | Disposition: A | Discharge status: Home / Self Care | Payer: Medicare HMO | Source: Ambulatory Visit | Attending: Cardiology | Admitting: Cardiology

## 2016-08-30 DIAGNOSIS — Z1231 Encounter for screening mammogram for malignant neoplasm of breast: Secondary | ICD-10-CM

## 2016-11-02 DIAGNOSIS — R7303 Prediabetes: Secondary | ICD-10-CM | POA: Insufficient documentation

## 2016-11-07 DIAGNOSIS — R262 Difficulty in walking, not elsewhere classified: Secondary | ICD-10-CM | POA: Diagnosis not present

## 2016-11-07 DIAGNOSIS — M25562 Pain in left knee: Secondary | ICD-10-CM | POA: Diagnosis not present

## 2016-11-07 DIAGNOSIS — M1712 Unilateral primary osteoarthritis, left knee: Secondary | ICD-10-CM | POA: Diagnosis not present

## 2016-11-25 DIAGNOSIS — J45909 Unspecified asthma, uncomplicated: Secondary | ICD-10-CM | POA: Insufficient documentation

## 2016-11-25 DIAGNOSIS — K7460 Unspecified cirrhosis of liver: Secondary | ICD-10-CM | POA: Diagnosis not present

## 2016-11-25 DIAGNOSIS — G603 Idiopathic progressive neuropathy: Secondary | ICD-10-CM | POA: Insufficient documentation

## 2016-11-25 DIAGNOSIS — I739 Peripheral vascular disease, unspecified: Secondary | ICD-10-CM | POA: Insufficient documentation

## 2016-11-25 DIAGNOSIS — I10 Essential (primary) hypertension: Secondary | ICD-10-CM | POA: Diagnosis not present

## 2016-11-25 DIAGNOSIS — E669 Obesity, unspecified: Secondary | ICD-10-CM | POA: Diagnosis not present

## 2016-11-25 DIAGNOSIS — R7303 Prediabetes: Secondary | ICD-10-CM | POA: Diagnosis not present

## 2016-12-19 ENCOUNTER — Encounter: Payer: Medicare HMO | Attending: Internal Medicine | Admitting: Registered"

## 2016-12-19 ENCOUNTER — Encounter: Payer: Self-pay | Admitting: Registered"

## 2016-12-19 DIAGNOSIS — I10 Essential (primary) hypertension: Secondary | ICD-10-CM | POA: Diagnosis not present

## 2016-12-19 DIAGNOSIS — Z713 Dietary counseling and surveillance: Secondary | ICD-10-CM | POA: Diagnosis not present

## 2016-12-19 DIAGNOSIS — R7303 Prediabetes: Secondary | ICD-10-CM | POA: Insufficient documentation

## 2016-12-19 DIAGNOSIS — R739 Hyperglycemia, unspecified: Secondary | ICD-10-CM

## 2016-12-19 NOTE — Progress Notes (Signed)
Medical Nutrition Therapy:  Appt start time: 1440 end time:  9147.  Assessment:  Primary concerns today: Pt states she has seen family members with diabetes and die with poor health and doesn't want that to happen to her. Pt states she wants to eat better.  Pt stated that she isn't depressed, but can be emotional. After a few more screening questions pt started crying when asked her last completed grade level (6th). During medication review pt reported she does not take the Cymbalta regularly, only when she feels depressed.  Patient states she is very motivated to do better. Pt states she will give her daughter my list of recommended foods and her daughter will buy them for her. Pt states because her daughter has a daycare she is required to provide healthier food for the kids and gets the same foods for the patient (whole wheat snack crackers)   Pt states her dentures makes it hard to eat meat. RD provided a Boost sample and patient tried it during visit and she didn't like it (not sweet enough) but sais she would drink it anyway if it will help her be healthy.   Pt states every morning she drinks apple cider vinegar, honey and cinnamon drink that she buys at [employer].  Preferred Learning Style:   No preference indicated   Learning Readiness:   Contemplating  MEDICATIONS: reviewed. Pt states she only takes Cymbalta when she feels depressed. Pt states she takes another B vitamin, but it was not added to her medication list because she didn't know if it was a complex or a specific B vitamin.   DIETARY INTAKE:  Doesn't like water unless it is very cold, but tries to get one or 2 bottles per day. Isn't successful every day. Drinks a lot of Sprite  24-hr recall:  B ( AM): none OR tries to eat grits OR egg sandwich Snk ( AM): sometimes skins  L ( PM): none Snk ( PM): potato chips D ( PM): Eats out occasionally. [employer], shrimp, bread, soup, broc cheese soup. Outback baby back ribs Snk  ( PM): cookies, potato chips, skins, whole grain crackers Beverages: Sprite, [employer] tea sometimes  Usual physical activity: ADLs  Estimated energy needs: 1600 calories 180 g carbohydrates 120 g protein 44 g fat  Progress Towards Goal(s):  In progress.   Nutritional Diagnosis:  NI-5.8.5 Inadeqate fiber intake As related to low intake of whole grains and vegetables.  As evidenced by dietary recall.    Intervention:  Nutrition Education. Discussed ways to prevent constipation. Discussed the importance of getting enough fiber, protein, carbs and fluid in for good health.  Plan: Consider eating more food with fiber Sweet Potato (eat with protein) Beans Any other vegetables will be good too.  The egg sandwich is a good sandwich. Boiled eggs are good source of protein. Also tuna fish is a good thing to include.  Consider trying protein drinks when you feel you should eat but don't have an appetite. Get the protein drinks lower in sugar like Boost Glucose Control (or [employer] equivalent is fine)  Aim to get movement in every day. Some walking and chair exercises would be good.  Try to get more water in.   Teaching Method Utilized:  Visual Auditory  Handouts given during visit include:  Arm chair exercises  Supplement sample provided: Boost [employer] Lot # [id number] was on the back of the bottle, exp 01/10/17  Barriers to learning/adherence to lifestyle change: limited mobility  Demonstrated  degree of understanding via:  Teach Back   Monitoring/Evaluation:  Dietary intake, exercise, and body weight prn.

## 2016-12-19 NOTE — Patient Instructions (Addendum)
Consider eating more food with fiber Sweet Potato (eat with protein) Beans Any other vegetables will be good too.  The egg sandwich is a good sandwich. Boiled eggs are good source of protein. Also tuna fish is a good thing to include.  Consider trying protein drinks when you feel you should eat but don't have an appetite. Get the protein drinks lower in sugar like Boost Glucose Control (or [employer] equivalent is fine)  Aim to get movement in every day. Some walking and chair exercises would be good.  Try to get more water in.

## 2017-01-03 ENCOUNTER — Other Ambulatory Visit: Payer: Self-pay | Admitting: Nurse Practitioner

## 2017-01-03 DIAGNOSIS — K7469 Other cirrhosis of liver: Secondary | ICD-10-CM

## 2017-01-10 ENCOUNTER — Ambulatory Visit
Admission: RE | Admit: 2017-01-10 | Discharge: 2017-01-10 | Disposition: A | Discharge status: Home / Self Care | Payer: Medicare HMO | Source: Ambulatory Visit | Attending: Nurse Practitioner | Admitting: Nurse Practitioner

## 2017-01-10 DIAGNOSIS — K7469 Other cirrhosis of liver: Secondary | ICD-10-CM

## 2017-02-18 ENCOUNTER — Encounter (HOSPITAL_COMMUNITY): Payer: Self-pay | Admitting: *Deleted

## 2017-02-18 ENCOUNTER — Inpatient Hospital Stay (HOSPITAL_COMMUNITY)
Admission: EM | Admit: 2017-02-18 | Discharge: 2017-02-21 | DRG: 871 | Disposition: A | Discharge status: Home / Self Care | Payer: Medicare HMO | Attending: Internal Medicine | Admitting: Internal Medicine

## 2017-02-18 ENCOUNTER — Emergency Department (HOSPITAL_COMMUNITY): Payer: Medicare HMO

## 2017-02-18 DIAGNOSIS — F1421 Cocaine dependence, in remission: Secondary | ICD-10-CM | POA: Diagnosis present

## 2017-02-18 DIAGNOSIS — K7460 Unspecified cirrhosis of liver: Secondary | ICD-10-CM | POA: Diagnosis not present

## 2017-02-18 DIAGNOSIS — R0682 Tachypnea, not elsewhere classified: Secondary | ICD-10-CM | POA: Diagnosis present

## 2017-02-18 DIAGNOSIS — F1121 Opioid dependence, in remission: Secondary | ICD-10-CM | POA: Diagnosis present

## 2017-02-18 DIAGNOSIS — Z811 Family history of alcohol abuse and dependence: Secondary | ICD-10-CM

## 2017-02-18 DIAGNOSIS — I10 Essential (primary) hypertension: Secondary | ICD-10-CM | POA: Diagnosis present

## 2017-02-18 DIAGNOSIS — D72829 Elevated white blood cell count, unspecified: Secondary | ICD-10-CM | POA: Diagnosis not present

## 2017-02-18 DIAGNOSIS — Z886 Allergy status to analgesic agent status: Secondary | ICD-10-CM

## 2017-02-18 DIAGNOSIS — M545 Low back pain: Secondary | ICD-10-CM | POA: Diagnosis present

## 2017-02-18 DIAGNOSIS — K5903 Drug induced constipation: Secondary | ICD-10-CM | POA: Diagnosis present

## 2017-02-18 DIAGNOSIS — A419 Sepsis, unspecified organism: Principal | ICD-10-CM | POA: Diagnosis present

## 2017-02-18 DIAGNOSIS — Z8619 Personal history of other infectious and parasitic diseases: Secondary | ICD-10-CM

## 2017-02-18 DIAGNOSIS — F1110 Opioid abuse, uncomplicated: Secondary | ICD-10-CM | POA: Diagnosis not present

## 2017-02-18 DIAGNOSIS — J181 Lobar pneumonia, unspecified organism: Secondary | ICD-10-CM | POA: Diagnosis not present

## 2017-02-18 DIAGNOSIS — R000 Tachycardia, unspecified: Secondary | ICD-10-CM | POA: Diagnosis present

## 2017-02-18 DIAGNOSIS — F329 Major depressive disorder, single episode, unspecified: Secondary | ICD-10-CM | POA: Diagnosis present

## 2017-02-18 DIAGNOSIS — M1990 Unspecified osteoarthritis, unspecified site: Secondary | ICD-10-CM | POA: Diagnosis present

## 2017-02-18 DIAGNOSIS — T402X5A Adverse effect of other opioids, initial encounter: Secondary | ICD-10-CM | POA: Diagnosis present

## 2017-02-18 DIAGNOSIS — E876 Hypokalemia: Secondary | ICD-10-CM | POA: Diagnosis present

## 2017-02-18 DIAGNOSIS — E669 Obesity, unspecified: Secondary | ICD-10-CM | POA: Diagnosis present

## 2017-02-18 DIAGNOSIS — Z96612 Presence of left artificial shoulder joint: Secondary | ICD-10-CM | POA: Diagnosis present

## 2017-02-18 DIAGNOSIS — R509 Fever, unspecified: Secondary | ICD-10-CM | POA: Diagnosis present

## 2017-02-18 DIAGNOSIS — F411 Generalized anxiety disorder: Secondary | ICD-10-CM | POA: Diagnosis present

## 2017-02-18 DIAGNOSIS — M5136 Other intervertebral disc degeneration, lumbar region: Secondary | ICD-10-CM | POA: Diagnosis present

## 2017-02-18 DIAGNOSIS — F1210 Cannabis abuse, uncomplicated: Secondary | ICD-10-CM | POA: Diagnosis present

## 2017-02-18 DIAGNOSIS — J189 Pneumonia, unspecified organism: Secondary | ICD-10-CM

## 2017-02-18 DIAGNOSIS — E860 Dehydration: Secondary | ICD-10-CM | POA: Diagnosis present

## 2017-02-18 DIAGNOSIS — M109 Gout, unspecified: Secondary | ICD-10-CM | POA: Diagnosis present

## 2017-02-18 DIAGNOSIS — Z79899 Other long term (current) drug therapy: Secondary | ICD-10-CM

## 2017-02-18 DIAGNOSIS — E7220 Disorder of urea cycle metabolism, unspecified: Secondary | ICD-10-CM | POA: Diagnosis present

## 2017-02-18 DIAGNOSIS — B1710 Acute hepatitis C without hepatic coma: Secondary | ICD-10-CM | POA: Diagnosis present

## 2017-02-18 DIAGNOSIS — D696 Thrombocytopenia, unspecified: Secondary | ICD-10-CM | POA: Diagnosis present

## 2017-02-18 DIAGNOSIS — G894 Chronic pain syndrome: Secondary | ICD-10-CM | POA: Diagnosis not present

## 2017-02-18 DIAGNOSIS — Z87891 Personal history of nicotine dependence: Secondary | ICD-10-CM

## 2017-02-18 DIAGNOSIS — Z6832 Body mass index (BMI) 32.0-32.9, adult: Secondary | ICD-10-CM

## 2017-02-18 DIAGNOSIS — Z880 Allergy status to penicillin: Secondary | ICD-10-CM

## 2017-02-18 DIAGNOSIS — R7989 Other specified abnormal findings of blood chemistry: Secondary | ICD-10-CM | POA: Diagnosis present

## 2017-02-18 DIAGNOSIS — I080 Rheumatic disorders of both mitral and aortic valves: Secondary | ICD-10-CM | POA: Diagnosis present

## 2017-02-18 DIAGNOSIS — M5032 Other cervical disc degeneration, mid-cervical region, unspecified level: Secondary | ICD-10-CM | POA: Diagnosis present

## 2017-02-18 HISTORY — DX: Pneumonia, unspecified organism: J18.9

## 2017-02-18 LAB — LACTIC ACID, PLASMA: LACTIC ACID, VENOUS: 1.8 mmol/L (ref 0.5–1.9)

## 2017-02-18 LAB — URINALYSIS, ROUTINE W REFLEX MICROSCOPIC
Bilirubin Urine: NEGATIVE
Glucose, UA: NEGATIVE mg/dL
HGB URINE DIPSTICK: NEGATIVE
Ketones, ur: NEGATIVE mg/dL
LEUKOCYTES UA: NEGATIVE
NITRITE: NEGATIVE
PROTEIN: NEGATIVE mg/dL
SPECIFIC GRAVITY, URINE: 1.011 (ref 1.005–1.030)
pH: 8 (ref 5.0–8.0)

## 2017-02-18 LAB — COMPREHENSIVE METABOLIC PANEL
ALT: 27 U/L (ref 14–54)
ANION GAP: 11 (ref 5–15)
AST: 37 U/L (ref 15–41)
Albumin: 3.9 g/dL (ref 3.5–5.0)
Alkaline Phosphatase: 84 U/L (ref 38–126)
BUN: 12 mg/dL (ref 6–20)
CALCIUM: 9.9 mg/dL (ref 8.9–10.3)
CO2: 25 mmol/L (ref 22–32)
CREATININE: 0.67 mg/dL (ref 0.44–1.00)
Chloride: 101 mmol/L (ref 101–111)
Glucose, Bld: 134 mg/dL — ABNORMAL HIGH (ref 65–99)
Potassium: 3.8 mmol/L (ref 3.5–5.1)
SODIUM: 137 mmol/L (ref 135–145)
TOTAL PROTEIN: 7.8 g/dL (ref 6.5–8.1)
Total Bilirubin: 0.9 mg/dL (ref 0.3–1.2)

## 2017-02-18 LAB — AMMONIA: AMMONIA: 50 umol/L — AB (ref 9–35)

## 2017-02-18 LAB — CBC
HCT: 36.1 % (ref 36.0–46.0)
HEMOGLOBIN: 11.7 g/dL — AB (ref 12.0–15.0)
MCH: 28.1 pg (ref 26.0–34.0)
MCHC: 32.4 g/dL (ref 30.0–36.0)
MCV: 86.6 fL (ref 78.0–100.0)
PLATELETS: 148 10*3/uL — AB (ref 150–400)
RBC: 4.17 MIL/uL (ref 3.87–5.11)
RDW: 12.3 % (ref 11.5–15.5)
WBC: 13.3 10*3/uL — AB (ref 4.0–10.5)

## 2017-02-18 LAB — RAPID URINE DRUG SCREEN, HOSP PERFORMED
Amphetamines: NOT DETECTED
BARBITURATES: NOT DETECTED
BENZODIAZEPINES: POSITIVE — AB
COCAINE: NOT DETECTED
OPIATES: POSITIVE — AB
Tetrahydrocannabinol: POSITIVE — AB

## 2017-02-18 MED ORDER — OXYCODONE HCL 5 MG PO TABS
10.0000 mg | ORAL_TABLET | Freq: Once | ORAL | Status: AC | PRN
  Administered 2017-02-18: 10 mg via ORAL
  Filled 2017-02-18: qty 2

## 2017-02-18 MED ORDER — DOCUSATE SODIUM 100 MG PO CAPS: 100.0000 mg | ORAL_CAPSULE | Freq: Every day | ORAL | Status: DC | PRN

## 2017-02-18 MED ORDER — LEVOFLOXACIN IN D5W 750 MG/150ML IV SOLN
750.0000 mg | INTRAVENOUS | Status: DC
  Administered 2017-02-19 – 2017-02-21 (×3): 750 mg via INTRAVENOUS
  Filled 2017-02-18 (×3): qty 150

## 2017-02-18 MED ORDER — CIPROFLOXACIN IN D5W 400 MG/200ML IV SOLN
400.0000 mg | Freq: Once | INTRAVENOUS | Status: AC
  Administered 2017-02-18: 400 mg via INTRAVENOUS
  Filled 2017-02-18: qty 200

## 2017-02-18 MED ORDER — VANCOMYCIN HCL IN DEXTROSE 1-5 GM/200ML-% IV SOLN
1000.0000 mg | Freq: Once | INTRAVENOUS | Status: AC
  Administered 2017-02-18: 1000 mg via INTRAVENOUS
  Filled 2017-02-18: qty 200

## 2017-02-18 MED ORDER — SODIUM CHLORIDE 0.9 % IV BOLUS (SEPSIS)
1000.0000 mL | Freq: Once | INTRAVENOUS | Status: AC
  Administered 2017-02-18: 1000 mL via INTRAVENOUS

## 2017-02-18 MED ORDER — LORAZEPAM 0.5 MG PO TABS: 0.5000 mg | ORAL_TABLET | Freq: Four times a day (QID) | ORAL | Status: DC | PRN

## 2017-02-18 MED ORDER — DULOXETINE HCL 30 MG PO CPEP
30.0000 mg | ORAL_CAPSULE | Freq: Every day | ORAL | Status: DC
  Administered 2017-02-19 – 2017-02-21 (×3): 30 mg via ORAL
  Filled 2017-02-18 (×3): qty 1

## 2017-02-18 MED ORDER — ONDANSETRON 4 MG PO TBDP: 4.0000 mg | ORAL_TABLET | Freq: Three times a day (TID) | ORAL | Status: DC | PRN

## 2017-02-18 MED ORDER — SODIUM CHLORIDE 0.9 % IV SOLN: 250.0000 mL | INTRAVENOUS | Status: DC | PRN

## 2017-02-18 MED ORDER — SODIUM CHLORIDE 0.9% FLUSH
3.0000 mL | Freq: Two times a day (BID) | INTRAVENOUS | Status: DC
  Administered 2017-02-18 – 2017-02-19 (×3): 3 mL via INTRAVENOUS

## 2017-02-18 MED ORDER — SODIUM CHLORIDE 0.9% FLUSH: 3.0000 mL | INTRAVENOUS | Status: DC | PRN

## 2017-02-18 MED ORDER — LACTULOSE 10 GM/15ML PO SOLN: 20.0000 g | Freq: Two times a day (BID) | ORAL | Status: DC | PRN

## 2017-02-18 MED ORDER — LIDOCAINE HCL (PF) 1 % IJ SOLN
5.0000 mL | Freq: Once | INTRAMUSCULAR | Status: AC
  Administered 2017-02-18: 5 mL via INTRADERMAL
  Filled 2017-02-18: qty 5

## 2017-02-18 MED ORDER — IBUPROFEN 200 MG PO TABS
400.0000 mg | ORAL_TABLET | Freq: Three times a day (TID) | ORAL | Status: DC | PRN
  Filled 2017-02-18: qty 2

## 2017-02-18 MED ORDER — LEVOFLOXACIN IN D5W 750 MG/150ML IV SOLN: 750.0000 mg | INTRAVENOUS | Status: DC

## 2017-02-18 MED ORDER — IOPAMIDOL (ISOVUE-300) INJECTION 61%
INTRAVENOUS | Status: AC
  Administered 2017-02-18: 100 mL
  Filled 2017-02-18: qty 100

## 2017-02-18 MED ORDER — ADULT MULTIVITAMIN W/MINERALS CH
1.0000 | ORAL_TABLET | Freq: Every day | ORAL | Status: DC
  Administered 2017-02-19 – 2017-02-21 (×3): 1 via ORAL
  Filled 2017-02-18 (×3): qty 1

## 2017-02-18 MED ORDER — AMLODIPINE BESYLATE 5 MG PO TABS
5.0000 mg | ORAL_TABLET | Freq: Every day | ORAL | Status: DC
  Administered 2017-02-19 – 2017-02-21 (×3): 5 mg via ORAL
  Filled 2017-02-18 (×3): qty 1

## 2017-02-18 MED ORDER — HEPARIN SODIUM (PORCINE) 5000 UNIT/ML IJ SOLN
5000.0000 [IU] | Freq: Three times a day (TID) | INTRAMUSCULAR | Status: DC
  Administered 2017-02-18 – 2017-02-21 (×8): 5000 [IU] via SUBCUTANEOUS
  Filled 2017-02-18 (×8): qty 1

## 2017-02-18 MED ORDER — IPRATROPIUM-ALBUTEROL 0.5-2.5 (3) MG/3ML IN SOLN: 3.0000 mL | Freq: Four times a day (QID) | RESPIRATORY_TRACT | Status: DC | PRN

## 2017-02-18 MED ORDER — OXYCODONE HCL 5 MG PO TABS
5.0000 mg | ORAL_TABLET | Freq: Four times a day (QID) | ORAL | Status: DC | PRN
  Administered 2017-02-19 – 2017-02-21 (×9): 10 mg via ORAL
  Filled 2017-02-18 (×10): qty 2

## 2017-02-18 MED ORDER — GABAPENTIN 100 MG PO CAPS
200.0000 mg | ORAL_CAPSULE | Freq: Every day | ORAL | Status: DC
  Filled 2017-02-18 (×2): qty 2

## 2017-02-18 NOTE — ED Notes (Signed)
IV access attempted X2 unsuccessful

## 2017-02-18 NOTE — ED Notes (Signed)
Family remains at bedside.

## 2017-02-18 NOTE — H&P (Signed)
History and Physical  Ashley Riddle UKG:[id number] DOB: 10-02-1945 DOA: 02/18/2017  Referring physician: Vanita Panda PCP: Audley Hose, MD   Chief Complaint: fever, altered mental status  HPI: Ashley Riddle is a 71 y.o. female with history of treated hepatitis C, chronic anxiety and cirrhosis of the liver, history of multiple recreational substance abuse presented to the ED complaining of 4 days of progressive generalized weakness and fever. The patient reports a productive cough with yellow sputum. Her family reports slowed mentation with less interactivity. She has not been confused. She denies having a history of hepatic encephalopathy. She is followed closely by a hepatologist. She reports that her liver has been stable. She has chronic low back pain. She reports increasing abdominal discomfort and nausea. She has had poor appetite and was recently started on gabapentin by her primary care provider 2 weeks ago.  ED course: The patient was found to be oriented, fever of 101, CT abdomen revealed a right lower lobe pneumonia, leukocytosis noted, mild ammonia elevation at 50. Hospital admission was requested for treatment of community-acquired pneumonia complicated by comorbidities.  Severity of Illness: The appropriate patient status for this patient is INPATIENT. Inpatient status is judged to be reasonable and necessary in order to provide the required intensity of service to ensure the patient's safety. The patient's presenting symptoms, physical exam findings, and initial radiographic and laboratory data in the context of their chronic comorbidities is felt to place them at high risk for further clinical deterioration. Furthermore, it is not anticipated that the patient will be medically stable for discharge from the hospital within 2 midnights of admission. The following factors support the patient status of inpatient.   " The patient's presenting symptoms include general malaise,  weakness, productive cough. " The worrisome physical exam findings include fever, tachycardia. " The initial radiographic and laboratory data are worrisome because of leukocytosis, elevated ammonia. " The chronic co-morbidities include cirrhosis, anxiety, chronic pain, history of multiple recreational drug abuse.  * I certify that at the point of admission it is my clinical judgment that the patient will require inpatient hospital care spanning beyond 2 midnights from the point of admission due to high intensity of service, high risk for further deterioration and high frequency of surveillance required.*  Review of Systems: All systems reviewed and apart from history of presenting illness, are negative.  Past Medical History:  Diagnosis Date  . Anxiety   . Arthritis   . Constipation   . DDD (degenerative disc disease), cervical   . DDD (degenerative disc disease), lumbar   . Gout   . Heart murmur    probable bicuspid AV with mild AS, mild MR by 04/22/14 Echo (Dr. Montez Morita)  . Hepatitis C    treated 2016   . History of blood transfusion   . HTN (hypertension)   . Hypertension   . Pneumonia 12/02/13   Past Surgical History:  Procedure Laterality Date  . ABDOMINAL HYSTERECTOMY    . APPENDECTOMY    . BACK SURGERY    . CESAREAN SECTION     x 3  . COLONOSCOPY W/ POLYPECTOMY    . HARDWARE REMOVAL Left 12/26/2015   Procedure: REMOVAL K-WIRE LEFT SCAPULA;  Surgeon: Garald Balding, MD;  Location: [facility];  Service: Orthopedics;  Laterality: Left;  . INNER EAR SURGERY     blood vessel  . TONSILLECTOMY    . TOTAL SHOULDER ARTHROPLASTY Left 12/27/2014   Procedure: TOTAL SHOULDER ARTHROPLASTY;  Surgeon: Collier Salina  Sharlotte Alamo, MD;  Location: [facility];  Service: Orthopedics;  Laterality: Left;   Social History:  reports that she has quit smoking. She quit after 49.00 years of use. She has never used smokeless tobacco. She reports that she uses drugs, including Marijuana. She reports that she does not  drink alcohol.  Allergies  Allergen Reactions  . Aspirin     DUE TO PATIENT HAVING HEPATISIS  . Penicillins Hives    Has patient had a PCN reaction causing immediate rash, facial/tongue/throat swelling, SOB or lightheadedness with hypotension: Yes Has patient had a PCN reaction causing severe rash involving mucus membranes or skin necrosis: No Has patient had a PCN reaction that required hospitalization No Has patient had a PCN reaction occurring within the last 10 years: No If all of the above answers are "NO", then may proceed with Cephalosporin use.     Family History  Problem Relation Age of Onset  . Heart disease Mother   . Kidney disease Mother   . Alcohol abuse Mother     Prior to Admission medications   Medication Sig Start Date End Date Taking? Authorizing Provider  albuterol (PROVENTIL HFA;VENTOLIN HFA) 108 (90 Base) MCG/ACT inhaler Inhale into the lungs every 6 (six) hours as needed for wheezing or shortness of breath.   Yes [provider]  amLODipine (NORVASC) 5 MG tablet Take 1 tablet (5 mg total) by mouth daily. 05/29/12  Yes Oswald Hillock, MD  colchicine 0.6 MG tablet Take 0.6 mg by mouth daily as needed (gout).    Yes [provider]  docusate sodium (COLACE) 100 MG capsule Take 100 mg by mouth daily as needed for mild constipation.    Yes [provider]  gabapentin (NEURONTIN) 100 MG capsule Take 200 mg by mouth at bedtime. 02/12/17  Yes [provider]  HYDROcodone-acetaminophen (NORCO/VICODIN) 5-325 MG tablet Take 1-2 tablets by mouth every 4 (four) hours as needed. 01/24/16  Yes Law, Bea Graff, PA-C  Multiple Vitamin (MULTIVITAMIN WITH MINERALS) TABS tablet Take 1 tablet by mouth daily.   Yes [provider]  ondansetron (ZOFRAN ODT) 4 MG disintegrating tablet Take 1 tablet (4 mg total) by mouth every 8 (eight) hours as needed for nausea or vomiting. 05/20/16  Yes Varney Biles, MD  orphenadrine (NORFLEX) 100 MG  tablet Take 1 tablet (100 mg total) by mouth 2 (two) times daily as needed for muscle spasms. 01/24/16  Yes Law, [place], PA-C  valsartan-hydrochlorothiazide (DIOVAN-HCT) 320-12.5 MG tablet Take 1 tablet by mouth daily. 12/11/15  Yes [provider]  DULoxetine (CYMBALTA) 30 MG capsule Take 1 capsule (30 mg total) by mouth daily. Patient taking differently: Take 30 mg by mouth daily as needed (FOR DEPRESSION).  09/29/14 12/19/16  Charlcie Cradle, MD   Physical Exam: Vitals:   02/18/17 1216 02/18/17 1330 02/18/17 1400 02/18/17 1630  BP: 121/83 124/80 125/79 111/72  Pulse: (!) 116 (!) 110 (!) 109 (!) 108  Resp:  (!) 28 (!) 28 (!) 28  Temp: (!) 101.6 F (38.7 C)     TempSrc: Oral     SpO2: 95% 95% 93% 94%    General exam: Moderately built and nourished patient, lying on right side on the gurney crying and complaining of back pain.  Head, eyes and ENT: Nontraumatic and normocephalic. Pupils equally reacting to light and accommodation. Oral mucosa dry and pale.  Neck: Supple. No JVD, carotid bruit or thyromegaly.  Lymphatics: No lymphadenopathy.  Respiratory system: Rales heard at right  lower lobe. No increased work of breathing.  Cardiovascular system: S1 and S2 heard, tachycardic. No JVD, murmurs, gallops, clicks.  Gastrointestinal system: Abdomen is nondistended, soft and nontender. Normal bowel sounds heard. No organomegaly or masses appreciated.  Central nervous system: Alert and oriented 3. No focal neurological deficits.  Extremities: Symmetric 5 x 5 power. Peripheral pulses symmetrically felt.   Skin: No rashes or acute findings.  Musculoskeletal system: Trace pretibial edema bilaterally otherwise Negative exam.  Psychiatry: Pleasant and cooperative.  Labs on Admission:  Basic Metabolic Panel:  Recent Labs Lab 02/18/17 1230  NA 137  K 3.8  CL 101  CO2 25  GLUCOSE 134*  BUN 12  CREATININE 0.67  CALCIUM 9.9   Liver Function Tests:  Recent Labs Lab  02/18/17 1230  AST 37  ALT 27  ALKPHOS 84  BILITOT 0.9  PROT 7.8  ALBUMIN 3.9   No results for input(s): LIPASE, AMYLASE in the last 168 hours.  Recent Labs Lab 02/18/17 1302  AMMONIA 50*   CBC:  Recent Labs Lab 02/18/17 1230  WBC 13.3*  HGB 11.7*  HCT 36.1  MCV 86.6  PLT 148*   Cardiac Enzymes: No results for input(s): CKTOTAL, CKMB, CKMBINDEX, TROPONINI in the last 168 hours.  BNP (last 3 results) No results for input(s): PROBNP in the last 8760 hours. CBG: No results for input(s): GLUCAP in the last 168 hours.  Radiological Exams on Admission: Ct Abdomen Pelvis W Contrast  Result Date: 02/18/2017 CLINICAL DATA:  Abdominal pain and weakness. EXAM: CT ABDOMEN AND PELVIS WITH CONTRAST TECHNIQUE: Multidetector CT imaging of the abdomen and pelvis was performed using the standard protocol following bolus administration of intravenous contrast. CONTRAST:  128mL ISOVUE-300 IOPAMIDOL (ISOVUE-300) INJECTION 61% COMPARISON:  CT abdomen pelvis dated January 24, 2016. FINDINGS: Lower chest: Patchy opacities in the right lower lobe. Hepatobiliary: Nodular hepatic contour. No focal liver lesion. The gallbladder is unremarkable. No biliary dilatation. Pancreas: Unremarkable. No pancreatic ductal dilatation or surrounding inflammatory changes. Spleen: Normal in size without focal abnormality. Adrenals/Urinary Tract: The adrenal glands are unremarkable. Subcentimeter low-density lesion in the upper pole of left kidney remains too small to characterize, but is unchanged. No renal calculi or hydronephrosis. Bladder is unremarkable. Stomach/Bowel: Stomach is within normal limits. Appendix appears normal. No evidence of bowel wall thickening, distention, or inflammatory changes. Vascular/Lymphatic: Mild aortic atherosclerosis. No enlarged abdominal or pelvic lymph nodes. Reproductive: Status post hysterectomy. No adnexal masses. Other: No free fluid or free air. Musculoskeletal: No acute or  significant osseous findings. 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 IMPRESSION: 1. No acute intra-abdominal process. 2. Nodular liver contour, suggestive of cirrhosis. 3. Patchy opacities in the right lower lobe, consistent with aspiration and/or pneumonia. Electronically Signed   By: Titus Dubin M.D.   On: 02/18/2017 16:22   Dg Chest Port 1 View  Result Date: 02/18/2017 CLINICAL DATA:  Dyspnea, fever EXAM: PORTABLE CHEST 1 VIEW COMPARISON:  05/20/2016 chest radiograph. FINDINGS: Partially visualized surgical hardware in the left proximal humerus and overlying the left glenoid, unchanged. Stable cardiomediastinal silhouette with normal heart size. No pneumothorax. No pleural effusion. No pulmonary edema. No acute consolidative airspace disease. Stable calcified subcentimeter right mid lung granuloma. IMPRESSION: No active cardiopulmonary disease. Electronically Signed   By: Ilona Sorrel M.D.   On: 02/18/2017 14:26   EKG: Independently reviewed. Sinus tachycardia seen.  Assessment/Plan Principal Problem:   CAP (community acquired pneumonia) Active Problems:   RLL pneumonia ([place])   Increased ammonia level  HEPATITIS C   Chronic pain syndrome   Cocaine dependence in remission (HCC)   Severe heroin dependence in sustained remission (HCC)   Opiate abuse, episodic   Mild tetrahydrocannabinol (THC) abuse   Liver cirrhosis (HCC)   Leukocytosis   Fever   Sinus tachycardia   Tachypnea  1. Community-acquired pneumonia-admitted for IV antibiotics, supportive therapy, oxygen if needed, mucolytics, follow blood cultures, trend white blood cell count. Incentive spirometry. Nebulizer treatments.  Repeat CXR (2 view) tomorrow.  Pneumonia order set utilized. There is some concern for aspiration. Ordered aspiration precautions. Elevate head of bed. SLP evaluation requested. 2. Liver cirrhosis-patient reports has been stable, mildly elevated ammonia level not associated with confusion  or encephalopathy will monitor. Avoid hepatotoxins if possible. Follow clinically. Urine drug screen pending. 3. Leukocytosis-secondary to acute pneumonia infection, treating with IV antibiotics, follow blood cultures, check lactic acid level, repeat CBC with differential in the morning. 4. Sinus tachycardia-secondary to acute infection, checking a lactic acid level and will trend. IV fluid hydration given in the emergency department. Treat fever and follow closely. 5. Chronic pain-patient reports history of chronic low back pain and takes pain medication regularly. Ordered when necessary pain medications as needed for chronic low back pain. Follow clinically. 6. Tachypnea-secondary to pneumonia, oxygen support as needed. 7. History of recreational drug abuse-urine drug screen pending. 8. Mild dehydration - patient was given IV fluids in the emergency department. 9. Thrombocytopenia-mild, follow closely, DC heparin if necessary. 10. Depression with generalized anxiety - resume home duloxetine.  Lorazepam as needed for severe anxiety.   11. History of opioid induced constipation - lactulose ordered as needed for constipation. 12. Essential Hypertension - BPs soft, did not resume all home BP meds at this time.    DVT Prophylaxis: heparin Code Status: Full   Family Communication: bedside  Disposition Plan: TBD   Time spent: 26 mins  Irwin Brakeman, MD [facility] Pager [phone]  If 7PM-7AM, please contact night-coverage [email or web address] Password [email or web address] 02/18/2017, 5:14 PM

## 2017-02-18 NOTE — ED Notes (Signed)
EDP at bedside  

## 2017-02-18 NOTE — ED Provider Notes (Signed)
[facility] DEPT Provider Note   CSN: [id number] Arrival date & time: 02/18/17  1212     History   Chief Complaint Chief Complaint  Patient presents with  . Altered Mental Status  . Fever    HPI Ashley Riddle is a 71 y.o. female.  HPI  Patient presents with her daughters who assist with the history of present illness. She notes multiple medical issues including anxiety, chronic lower extremity pain, and history of hepatitis C. She has completed therapy for treatment of hepatitis C. Seemingly over the past 3 or 4 days she has developed increasing generalized weakness as well as slowed mentation, with less interactivity, less speaking. No report of new fall, vomiting, diarrhea. Patient always has some degree of pain, has increasing abdominal pain, as well as increasing nausea, anorexia. Today the patient is also warm, and on arrival was found to have a fever. No recent medication changes, diet changes beyond addition of gabapentin about 2 weeks ago.   Past Medical History:  Diagnosis Date  . Anxiety   . Arthritis   . Constipation   . DDD (degenerative disc disease), cervical   . DDD (degenerative disc disease), lumbar   . Gout   . Heart murmur    probable bicuspid AV with mild AS, mild MR by 04/22/14 Echo (Dr. Montez Morita)  . Hepatitis C    treated 2016   . History of blood transfusion   . HTN (hypertension)   . Hypertension   . Pneumonia 12/02/13    Patient Active Problem List   Diagnosis Date Noted  . Retained metal fragment left scapula 12/26/2015  . Fracture of glenoid process of left scapula 12/29/2014  . Status post total shoulder arthroplasty 12/27/2014  . Cocaine dependence in remission ([place]) 09/29/2014  . Severe heroin dependence in sustained remission ([place]) 09/29/2014  . Opiate abuse, episodic 09/29/2014  . Mild tetrahydrocannabinol (THC) abuse 09/29/2014  . Adjustment disorder with depressed mood 09/29/2014  . Chronic pain syndrome 04/18/2014  .  Primary osteoarthritis of left shoulder 04/18/2014  . Primary osteoarthritis of right knee 04/18/2014  . Primary osteoarthritis of left knee 04/18/2014  . ANXIETY 03/22/2010  . HYPERTENSION, BENIGN ESSENTIAL 03/22/2010  . ASTHMA 03/22/2010  . CONSTIPATION 03/22/2010  . HEPATITIS C 06/04/1995    Past Surgical History:  Procedure Laterality Date  . ABDOMINAL HYSTERECTOMY    . APPENDECTOMY    . BACK SURGERY    . CESAREAN SECTION     x 3  . COLONOSCOPY W/ POLYPECTOMY    . HARDWARE REMOVAL Left 12/26/2015   Procedure: REMOVAL K-WIRE LEFT SCAPULA;  Surgeon: Garald Balding, MD;  Location: [facility];  Service: Orthopedics;  Laterality: Left;  . INNER EAR SURGERY     blood vessel  . TONSILLECTOMY    . TOTAL SHOULDER ARTHROPLASTY Left 12/27/2014   Procedure: TOTAL SHOULDER ARTHROPLASTY;  Surgeon: Garald Balding, MD;  Location: [facility];  Service: Orthopedics;  Laterality: Left;    OB History    No data available       Home Medications    Prior to Admission medications   Medication Sig Start Date End Date Taking? Authorizing Provider  albuterol (PROVENTIL HFA;VENTOLIN HFA) 108 (90 Base) MCG/ACT inhaler Inhale into the lungs every 6 (six) hours as needed for wheezing or shortness of breath.   Yes [provider]  amLODipine (NORVASC) 5 MG tablet Take 1 tablet (5 mg total) by mouth daily. 05/29/12  Yes Oswald Hillock, MD  colchicine  0.6 MG tablet Take 0.6 mg by mouth daily as needed (gout).    Yes [provider]  docusate sodium (COLACE) 100 MG capsule Take 100 mg by mouth daily as needed for mild constipation.    Yes [provider]  gabapentin (NEURONTIN) 100 MG capsule Take 200 mg by mouth at bedtime. 02/12/17  Yes [provider]  HYDROcodone-acetaminophen (NORCO/VICODIN) 5-325 MG tablet Take 1-2 tablets by mouth every 4 (four) hours as needed. 01/24/16  Yes Law, Bea Graff, PA-C  Multiple Vitamin (MULTIVITAMIN WITH MINERALS) TABS tablet Take 1 tablet  by mouth daily.   Yes [provider]  ondansetron (ZOFRAN ODT) 4 MG disintegrating tablet Take 1 tablet (4 mg total) by mouth every 8 (eight) hours as needed for nausea or vomiting. 05/20/16  Yes Varney Biles, MD  orphenadrine (NORFLEX) 100 MG tablet Take 1 tablet (100 mg total) by mouth 2 (two) times daily as needed for muscle spasms. 01/24/16  Yes Law, [place], PA-C  valsartan-hydrochlorothiazide (DIOVAN-HCT) 320-12.5 MG tablet Take 1 tablet by mouth daily. 12/11/15  Yes [provider]  acetaminophen (TYLENOL 8 HOUR) 650 MG CR tablet Take 1 tablet (650 mg total) by mouth every 8 (eight) hours as needed for pain. Patient not taking: Reported on 12/19/2016 05/20/16   Varney Biles, MD  DULoxetine (CYMBALTA) 30 MG capsule Take 1 capsule (30 mg total) by mouth daily. Patient taking differently: Take 30 mg by mouth daily as needed (FOR DEPRESSION).  09/29/14 12/19/16  Charlcie Cradle, MD  oseltamivir (TAMIFLU) 75 MG capsule Take 1 capsule (75 mg total) by mouth every 12 (twelve) hours. Patient not taking: Reported on 12/19/2016 05/18/16   Jeannett Senior, PA-C    Family History Family History  Problem Relation Age of Onset  . Heart disease Mother   . Kidney disease Mother   . Alcohol abuse Mother     Social History Social History  Substance Use Topics  . Smoking status: Former Smoker    Years: 49.00  . Smokeless tobacco: Never Used     Comment: quit 2007  . Alcohol use No     Allergies   Aspirin and Penicillins   Review of Systems Review of Systems  Constitutional:       Per HPI, otherwise negative  HENT:       Per HPI, otherwise negative  Respiratory:       Per HPI, otherwise negative  Cardiovascular:       Per HPI, otherwise negative  Gastrointestinal: Negative for vomiting.  Endocrine:       Negative aside from HPI  Genitourinary:       Neg aside from HPI   Musculoskeletal:       Chronic bilateral lower extremity pain  Skin: Negative.     Neurological: Positive for weakness and light-headedness. Negative for syncope.     Physical Exam Updated Vital Signs BP 125/79   Pulse (!) 109   Temp (!) 101.6 F (38.7 C) (Oral)   Resp (!) 28   SpO2 93%   Physical Exam  Constitutional: She is oriented to person, place, and time. She has a sickly appearance. No distress.  HENT:  Head: Normocephalic and atraumatic.  Eyes: Conjunctivae and EOM are normal.  Cardiovascular: Normal rate and regular rhythm.   Pulmonary/Chest: Effort normal. No stridor. No respiratory distress. She has decreased breath sounds.  Abdominal: She exhibits no distension.    Musculoskeletal: She exhibits no edema.  Neurological: She is alert and oriented to person,  place, and time. She displays atrophy. She displays no tremor. No cranial nerve deficit. She displays no seizure activity.  Patient has slow speech, but answers questions appropriately, describes her situation appropriately  Skin: Skin is warm and dry.  No jaundice  Psychiatric: Her mood appears anxious.  Nursing note and vitals reviewed.    ED Treatments / Results  Labs (all labs ordered are listed, but only abnormal results are displayed) Labs Reviewed  COMPREHENSIVE METABOLIC PANEL - Abnormal; Notable for the following:       Result Value   Glucose, Bld 134 (*)    All other components within normal limits  CBC - Abnormal; Notable for the following:    WBC 13.3 (*)    Hemoglobin 11.7 (*)    Platelets 148 (*)    All other components within normal limits  AMMONIA - Abnormal; Notable for the following:    Ammonia 50 (*)    All other components within normal limits  CULTURE, BLOOD (SINGLE)  URINALYSIS, ROUTINE W REFLEX MICROSCOPIC  CBG MONITORING, ED  CBG MONITORING, ED    EKG  EKG Interpretation  Date/Time:  Tuesday February 18 2017 12:18:44 EDT Ventricular Rate:  117 PR Interval:  152 QRS Duration: 74 QT Interval:  322 QTC Calculation: 449 R Axis:   5 Text  Interpretation:  Sinus tachycardia Otherwise within normal limits Confirmed by Carmin Muskrat [phone]) on 02/18/2017 1:24:46 PM       Radiology Ct Abdomen Pelvis W Contrast  Result Date: 02/18/2017 CLINICAL DATA:  Abdominal pain and weakness. EXAM: CT ABDOMEN AND PELVIS WITH CONTRAST TECHNIQUE: Multidetector CT imaging of the abdomen and pelvis was performed using the standard protocol following bolus administration of intravenous contrast. CONTRAST:  110mL ISOVUE-300 IOPAMIDOL (ISOVUE-300) INJECTION 61% COMPARISON:  CT abdomen pelvis dated January 24, 2016. FINDINGS: Lower chest: Patchy opacities in the right lower lobe. Hepatobiliary: Nodular hepatic contour. No focal liver lesion. The gallbladder is unremarkable. No biliary dilatation. Pancreas: Unremarkable. No pancreatic ductal dilatation or surrounding inflammatory changes. Spleen: Normal in size without focal abnormality. Adrenals/Urinary Tract: The adrenal glands are unremarkable. Subcentimeter low-density lesion in the upper pole of left kidney remains too small to characterize, but is unchanged. No renal calculi or hydronephrosis. Bladder is unremarkable. Stomach/Bowel: Stomach is within normal limits. Appendix appears normal. No evidence of bowel wall thickening, distention, or inflammatory changes. Vascular/Lymphatic: Mild aortic atherosclerosis. No enlarged abdominal or pelvic lymph nodes. Reproductive: Status post hysterectomy. No adnexal masses. Other: No free fluid or free air. Musculoskeletal: No acute or significant osseous findings. 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 IMPRESSION: 1. No acute intra-abdominal process. 2. Nodular liver contour, suggestive of cirrhosis. 3. Patchy opacities in the right lower lobe, consistent with aspiration and/or pneumonia. Electronically Signed   By: Titus Dubin M.D.   On: 02/18/2017 16:22   Dg Chest Port 1 View  Result Date: 02/18/2017 CLINICAL DATA:  Dyspnea, fever EXAM: PORTABLE  CHEST 1 VIEW COMPARISON:  05/20/2016 chest radiograph. FINDINGS: Partially visualized surgical hardware in the left proximal humerus and overlying the left glenoid, unchanged. Stable cardiomediastinal silhouette with normal heart size. No pneumothorax. No pleural effusion. No pulmonary edema. No acute consolidative airspace disease. Stable calcified subcentimeter right mid lung granuloma. IMPRESSION: No active cardiopulmonary disease. Electronically Signed   By: Ilona Sorrel M.D.   On: 02/18/2017 14:26    Procedures Procedures (including critical care time)  Medications Ordered in ED Medications  vancomycin (VANCOCIN) IVPB 1000 mg/200 mL premix (not administered)  ciprofloxacin (  CIPRO) IVPB 400 mg (not administered)  lidocaine (PF) (XYLOCAINE) 1 % injection 5 mL (5 mLs Intradermal Given by Other 02/18/17 1416)  iopamidol (ISOVUE-300) 61 % injection (100 mLs  Contrast Given 02/18/17 1535)     Initial Impression / Assessment and Plan / ED Course  I have reviewed the triage vital signs and the nursing notes.  Pertinent labs & imaging results that were available during my care of the patient were reviewed by me and considered in my medical decision making (see chart for details).  4:33 PM Patient column, hemodynamically stable. I discussed all findings with patient and her daughter, and a family friend. With concern for pneumonia altered mental status, leukocytosis and hyperammonemia, the patient will be admitted. Patient received antibiotics empirically for presumed intra-abdominal infection, though those results seem generally reassuring.   Final Clinical Impressions(s) / ED Diagnoses  Altered mental status Community acquired pneumonia   Carmin Muskrat, MD 02/18/17 [phone]

## 2017-02-18 NOTE — ED Triage Notes (Signed)
To ED for eval of ALOC and weakness. Started today. Pt states she was seen by MD on Friday due to knee pain- given med- and hasn't felt normal since. Pt very lethargic.

## 2017-02-18 NOTE — ED Notes (Signed)
Xray at the bedside.

## 2017-02-18 NOTE — Progress Notes (Signed)
Pt admitted from ED with the diagnosis of [place], pt alert and oriented, settled in bed with call light and family at bedside, safety concern addressed accordingly, was however reassured and will continue to monitor, v/s stable. [place]-Asidi, Keaton Stirewalt Efe

## 2017-02-19 DIAGNOSIS — F1421 Cocaine dependence, in remission: Secondary | ICD-10-CM

## 2017-02-19 DIAGNOSIS — A419 Sepsis, unspecified organism: Principal | ICD-10-CM

## 2017-02-19 DIAGNOSIS — R000 Tachycardia, unspecified: Secondary | ICD-10-CM

## 2017-02-19 DIAGNOSIS — F1121 Opioid dependence, in remission: Secondary | ICD-10-CM

## 2017-02-19 DIAGNOSIS — F1110 Opioid abuse, uncomplicated: Secondary | ICD-10-CM

## 2017-02-19 DIAGNOSIS — J181 Lobar pneumonia, unspecified organism: Secondary | ICD-10-CM

## 2017-02-19 DIAGNOSIS — K7460 Unspecified cirrhosis of liver: Secondary | ICD-10-CM

## 2017-02-19 DIAGNOSIS — B1710 Acute hepatitis C without hepatic coma: Secondary | ICD-10-CM

## 2017-02-19 DIAGNOSIS — D72829 Elevated white blood cell count, unspecified: Secondary | ICD-10-CM

## 2017-02-19 DIAGNOSIS — F1210 Cannabis abuse, uncomplicated: Secondary | ICD-10-CM

## 2017-02-19 DIAGNOSIS — R509 Fever, unspecified: Secondary | ICD-10-CM

## 2017-02-19 DIAGNOSIS — R0682 Tachypnea, not elsewhere classified: Secondary | ICD-10-CM

## 2017-02-19 DIAGNOSIS — G894 Chronic pain syndrome: Secondary | ICD-10-CM

## 2017-02-19 LAB — CBC WITH DIFFERENTIAL/PLATELET
BASOS ABS: 0 10*3/uL (ref 0.0–0.1)
Basophils Relative: 0 %
EOS ABS: 0 10*3/uL (ref 0.0–0.7)
EOS PCT: 0 %
HCT: 31.8 % — ABNORMAL LOW (ref 36.0–46.0)
HEMOGLOBIN: 10.5 g/dL — AB (ref 12.0–15.0)
Lymphocytes Relative: 14 %
Lymphs Abs: 2.1 10*3/uL (ref 0.7–4.0)
MCH: 28.5 pg (ref 26.0–34.0)
MCHC: 33 g/dL (ref 30.0–36.0)
MCV: 86.2 fL (ref 78.0–100.0)
Monocytes Absolute: 0.9 10*3/uL (ref 0.1–1.0)
Monocytes Relative: 6 %
Neutro Abs: 11.9 10*3/uL — ABNORMAL HIGH (ref 1.7–7.7)
Neutrophils Relative %: 80 %
PLATELETS: 138 10*3/uL — AB (ref 150–400)
RBC: 3.69 MIL/uL — AB (ref 3.87–5.11)
RDW: 12.4 % (ref 11.5–15.5)
WBC: 14.9 10*3/uL — AB (ref 4.0–10.5)

## 2017-02-19 LAB — COMPREHENSIVE METABOLIC PANEL
ALK PHOS: 82 U/L (ref 38–126)
ALT: 22 U/L (ref 14–54)
ANION GAP: 6 (ref 5–15)
AST: 25 U/L (ref 15–41)
Albumin: 3.3 g/dL — ABNORMAL LOW (ref 3.5–5.0)
BILIRUBIN TOTAL: 0.7 mg/dL (ref 0.3–1.2)
BUN: 11 mg/dL (ref 6–20)
CALCIUM: 9.5 mg/dL (ref 8.9–10.3)
CO2: 29 mmol/L (ref 22–32)
Chloride: 101 mmol/L (ref 101–111)
Creatinine, Ser: 0.61 mg/dL (ref 0.44–1.00)
GFR calc non Af Amer: >60 mL/min (ref >60)
Glucose, Bld: 105 mg/dL — ABNORMAL HIGH (ref 65–99)
Potassium: 3.3 mmol/L — ABNORMAL LOW (ref 3.5–5.1)
Sodium: 136 mmol/L (ref 135–145)
TOTAL PROTEIN: 7 g/dL (ref 6.5–8.1)

## 2017-02-19 LAB — INFLUENZA PANEL BY PCR (TYPE A & B)
Influenza A By PCR: NEGATIVE
Influenza B By PCR: NEGATIVE

## 2017-02-19 LAB — HIV ANTIBODY (ROUTINE TESTING W REFLEX): HIV Screen 4th Generation wRfx: NONREACTIVE

## 2017-02-19 LAB — LACTIC ACID, PLASMA: Lactic Acid, Venous: 1.1 mmol/L (ref 0.5–1.9)

## 2017-02-19 LAB — PHOSPHORUS: PHOSPHORUS: 2.3 mg/dL — AB (ref 2.5–4.6)

## 2017-02-19 LAB — STREP PNEUMONIAE URINARY ANTIGEN: Strep Pneumo Urinary Antigen: NEGATIVE

## 2017-02-19 LAB — AMMONIA: AMMONIA: 32 umol/L (ref 9–35)

## 2017-02-19 LAB — MAGNESIUM: MAGNESIUM: 1.6 mg/dL — AB (ref 1.7–2.4)

## 2017-02-19 MED ORDER — MAGNESIUM SULFATE 2 GM/50ML IV SOLN
2.0000 g | Freq: Once | INTRAVENOUS | Status: AC
  Administered 2017-02-19: 2 g via INTRAVENOUS
  Filled 2017-02-19: qty 50

## 2017-02-19 MED ORDER — BOOST / RESOURCE BREEZE PO LIQD
1.0000 | Freq: Three times a day (TID) | ORAL | Status: DC
  Administered 2017-02-19 – 2017-02-21 (×4): 1 via ORAL
  Filled 2017-02-19 (×10): qty 1

## 2017-02-19 MED ORDER — POTASSIUM CHLORIDE CRYS ER 20 MEQ PO TBCR
40.0000 meq | EXTENDED_RELEASE_TABLET | Freq: Once | ORAL | Status: AC
  Administered 2017-02-19: 40 meq via ORAL
  Filled 2017-02-19: qty 2

## 2017-02-19 NOTE — Therapy (Signed)
Occupational Therapy Evaluation Patient Details Name: Ashley Riddle MRN: [id number] DOB: 27-Jul-1945 Today's Date: 02/19/2017    History of Present Illness Pt is a 71 y/o female admitted secondary to fever, AMS and found to have CAP. PMH including but not limited to Hep C, HTN, cirrhosis and hx of polysubstance abuse.   Clinical Impression   Pt reports being independent in ADLs, working part time, and driving PTA. Currently pt requires min guard for functional mobility and transfer, supervision for LB ADLs and grooming tasks in standing; mod I for all other ADLs. Pt reports daughter and nephew are available to assist as needed upon d/c. OT will continue to follow acutely to address established goals. Pt would benefit from [place] to increase independence with ADL tasks and return to PLOF.     Follow Up Recommendations  Home health OT;Supervision/Assistance - 24 hour    Equipment Recommendations  3 in 1 bedside commode    Recommendations for Other Services       Precautions / Restrictions Restrictions Weight Bearing Restrictions: No      Mobility Bed Mobility Overal bed mobility: Modified Independent             General bed mobility comments: Pt OOB in recliner chair upon arrival  Transfers Overall transfer level: Needs assistance Equipment used: None Transfers: Sit to/from Stand Sit to Stand: Min guard         General transfer comment: increased time and effort, close min guard for safety    Balance Overall balance assessment: Needs assistance Sitting-balance support: No upper extremity supported;Feet supported Sitting balance-Leahy Scale: Good Sitting balance - Comments: Pt able to sit EOB and adjust socks with no LOB.   Standing balance support: Single extremity supported;No upper extremity supported;During functional activity Standing balance-Leahy Scale: Fair Standing balance comment: Pt furniture walked for UE support during functional mobility.                             ADL either performed or assessed with clinical judgement   ADL Overall ADL's : Needs assistance/impaired     Grooming: Supervision/safety;Set up;Standing   Upper Body Bathing: Modified independent;Sitting   Lower Body Bathing: Supervison/ safety;Set up;Sitting/lateral leans   Upper Body Dressing : Modified independent   Lower Body Dressing: Supervision/safety;Set up;Sitting/lateral leans   Toilet Transfer: Min guard;Ambulation;Grab bars Toilet Transfer Details (indicate cue type and reason): Increased knee pain getting on/off toilet.  Toileting- Water quality scientist and Hygiene: Modified independent   Tub/ Banker: Min guard;Ambulation;3 in 1   Functional mobility during ADLs: Min guard General ADL Comments: Pt used walls/furniture for UE support during functional mobility and required min guard for safety. Pt may benefit from AE to increase independence and conserve engery during ADL tasks.     Vision         Perception     Praxis      Pertinent Vitals/Pain Pain Assessment: No/denies pain     Hand Dominance     Extremity/Trunk Assessment Upper Extremity Assessment Upper Extremity Assessment: Overall WFL for tasks assessed   Lower Extremity Assessment Lower Extremity Assessment: Defer to PT evaluation       Communication Communication Communication: No difficulties   Cognition Arousal/Alertness: Awake/alert Behavior During Therapy: WFL for tasks assessed/performed Overall Cognitive Status: Impaired/Different from baseline Area of Impairment: Safety/judgement                   Current Attention Level:  Selective   Following Commands: Follows one step commands consistently Safety/Judgement: Decreased awareness of safety;Decreased awareness of deficits   Problem Solving: Slow processing;Difficulty sequencing;Requires verbal cues     General Comments  Pts nephew present during session    Exercises      Shoulder Instructions      Home Living Family/patient expects to be discharged to:: Private residence Living Arrangements: Alone;Children (Daughter in/out of home) Available Help at Discharge: Family;Available PRN/intermittently;Available 24 hours/day Type of Home: House Home Access: Level entry     Home Layout: One level     Bathroom Shower/Tub: Teacher, early years/pre: Standard Bathroom Accessibility: Yes How Accessible: Accessible via walker Home Equipment: None   Additional Comments: Pt providing varying information regarding home environment. Pt's daughter present in room but on the phone for most of session and not providing any information.      Prior Functioning/Environment Level of Independence: Independent        Comments: Pt works cleaning houses. Enjoys playing bingo. Independent in money and medication management.  Still driving.        OT Problem List: Decreased activity tolerance;Impaired balance (sitting and/or standing);Decreased safety awareness;Decreased knowledge of use of DME or AE      OT Treatment/Interventions: Self-care/ADL training;Therapeutic exercise;Energy conservation;DME and/or AE instruction;Therapeutic activities;Cognitive remediation/compensation;Patient/family education;Balance training    OT Goals(Current goals can be found in the care plan section) Acute Rehab OT Goals Patient Stated Goal: return home OT Goal Formulation: With patient Time For Goal Achievement: 03/05/17 Potential to Achieve Goals: Good ADL Goals Pt Will Perform Grooming: with modified independence;standing (energy conservation for grooming tasks.) Pt Will Perform Lower Body Bathing: with modified independence;sit to/from stand (possible AE for engery conservation) Pt Will Perform Lower Body Dressing: with modified independence;sit to/from stand (possible AE for enegery conservation) Pt Will Perform Tub/Shower Transfer: Tub transfer;with modified  independence;ambulating;3 in 1 (energy conservation with showering/bathing) Additional ADL Goal #1: Pt will independently verbalize 3 energy conservation techniques.   OT Frequency: Min 2X/week   Barriers to D/C:            Co-evaluation              AM-PAC PT "6 Clicks" Daily Activity     Outcome Measure Help from another person eating meals?: None Help from another person taking care of personal grooming?: None Help from another person toileting, which includes using toliet, bedpan, or urinal?: None Help from another person bathing (including washing, rinsing, drying)?: A Little Help from another person to put on and taking off regular upper body clothing?: None Help from another person to put on and taking off regular lower body clothing?: A Little 6 Click Score: 22   End of Session Equipment Utilized During Treatment: Gait belt Nurse Communication: Mobility status  Activity Tolerance: Patient tolerated treatment well Patient left: in bed;with call bell/phone within reach;with nursing/sitter in room  OT Visit Diagnosis: Unsteadiness on feet (R26.81);Other abnormalities of gait and mobility (R26.89)                Time: 2409-7353 OT Time Calculation (min): 15 min Charges:  OT General Charges $OT Visit: 1 Visit OT Evaluation $OT Eval Moderate Complexity: 1 Mod G-Codes:     Boykin Peek, OTS [phone]   Boykin Peek 02/19/2017, 5:40 PM

## 2017-02-19 NOTE — Care Management Note (Signed)
Case Management Note  Patient Details  Name: Ashley Riddle MRN: [id number] Date of Birth: 31-Jul-1945  Subjective/Objective:   Pt in with CAP. She is from home alone.                 Action/Plan: Awaiting PT/OT recommendations. CM following for d/c needs, physician orders.   Expected Discharge Date:                  Expected Discharge Plan:     In-House Referral:     Discharge planning Services     Post Acute Care Choice:    Choice offered to:     DME Arranged:    DME Agency:     HH Arranged:    HH Agency:     Status of Service:  In process, will continue to follow  If discussed at Long Length of Stay Meetings, dates discussed:    Additional Comments:  Pollie Friar, RN 02/19/2017, 1:15 PM

## 2017-02-19 NOTE — Evaluation (Signed)
Physical Therapy Evaluation Patient Details Name: Ashley Riddle MRN: [id number] DOB: Apr 21, 1946 Today's Date: 02/19/2017   History of Present Illness  Pt is a 71 y/o female admitted secondary to fever, AMS and found to have CAP. PMH including but not limited to Hep C, HTN, cirrhosis and hx of polysubstance abuse.  Clinical Impression  Pt presented sitting OOB in recliner chair, awake and willing to participate in therapy session. Prior to admission, pt reported that she was independent with all functional mobility and ADLs. Pt reported that she has a knee brace that she wears occasionally and has "shots" for her knees "every 90 days". Pt initially reporting that she lives with her daughter but later stated that she lives alone and stays with her daughter sometimes. Pt's daughter present in room but on her phone most of the time and not providing any helpful information. Pt ambulated a very short distance (~20') within her room with no AD, close min guard to min A for stability and safety. Pt would continue to benefit from skilled physical therapy services at this time while admitted and after d/c to address the below listed limitations in order to improve overall safety and independence with functional mobility.     Follow Up Recommendations SNF;Supervision/Assistance - 24 hour;Other (comment) (pt adamant about returning home, requesting OP PT)    Equipment Recommendations  Other (comment) (TBD with mobility progression)    Recommendations for Other Services       Precautions / Restrictions Restrictions Weight Bearing Restrictions: No      Mobility  Bed Mobility               General bed mobility comments: Pt OOB in recliner chair upon arrival  Transfers Overall transfer level: Needs assistance Equipment used: None Transfers: Sit to/from Stand Sit to Stand: Min guard         General transfer comment: increased time and effort, close min guard for  safety  Ambulation/Gait Ambulation/Gait assistance: Min guard;Min assist Ambulation Distance (Feet): 20 Feet Assistive device: None Gait Pattern/deviations: Step-through pattern;Decreased step length - left;Decreased step length - right;Decreased stride length;Antalgic Gait velocity: decreased Gait velocity interpretation: Below normal speed for age/gender General Gait Details: moderate antalgic gait (pt with reported chronic knee pain), requiring close min guard to min A for stability and support  Stairs            Wheelchair Mobility    Modified Rankin (Stroke Patients Only)       Balance Overall balance assessment: Needs assistance Sitting-balance support: Feet supported Sitting balance-Leahy Scale: Fair     Standing balance support: No upper extremity supported;During functional activity Standing balance-Leahy Scale: Fair                               Pertinent Vitals/Pain Pain Assessment: No/denies pain    Home Living Family/patient expects to be discharged to:: Private residence Living Arrangements: Alone Available Help at Discharge: Family;Available PRN/intermittently;Available 24 hours/day Type of Home: House Home Access: Level entry     Home Layout: One level Home Equipment: None Additional Comments: Pt providing varying information regarding home environment. Pt's daughter present in room but on the phone for most of session and not providing any information.    Prior Function Level of Independence: Independent               Hand Dominance        Extremity/Trunk Assessment  Upper Extremity Assessment Upper Extremity Assessment: Defer to OT evaluation    Lower Extremity Assessment Lower Extremity Assessment: Generalized weakness (pt reported chronic knee pain)       Communication   Communication: No difficulties  Cognition Arousal/Alertness: Awake/alert Behavior During Therapy: WFL for tasks  assessed/performed Overall Cognitive Status: Impaired/Different from baseline Area of Impairment: Attention;Following commands;Safety/judgement;Problem solving                   Current Attention Level: Selective   Following Commands: Follows one step commands consistently Safety/Judgement: Decreased awareness of safety;Decreased awareness of deficits   Problem Solving: Slow processing;Difficulty sequencing;Requires verbal cues        General Comments      Exercises     Assessment/Plan    PT Assessment Patient needs continued PT services  PT Problem List Decreased strength;Decreased activity tolerance;Decreased balance;Decreased mobility;Decreased coordination;Decreased knowledge of use of DME;Decreased safety awareness;Decreased knowledge of precautions;Pain       PT Treatment Interventions DME instruction;Gait training;Stair training;Functional mobility training;Therapeutic activities;Therapeutic exercise;Balance training;Neuromuscular re-education;Patient/family education    PT Goals (Current goals can be found in the Care Plan section)  Acute Rehab PT Goals Patient Stated Goal: return home PT Goal Formulation: With patient Time For Goal Achievement: 03/05/17 Potential to Achieve Goals: Good    Frequency Min 3X/week   Barriers to discharge        Co-evaluation               AM-PAC PT "6 Clicks" Daily Activity  Outcome Measure Difficulty turning over in bed (including adjusting bedclothes, sheets and blankets)?: A Lot Difficulty moving from lying on back to sitting on the side of the bed? : A Lot Difficulty sitting down on and standing up from a chair with arms (e.g., wheelchair, bedside commode, etc,.)?: A Lot Help needed moving to and from a bed to chair (including a wheelchair)?: A Little Help needed walking in hospital room?: A Little Help needed climbing 3-5 steps with a railing? : A Lot 6 Click Score: 14    End of Session   Activity  Tolerance: Patient limited by pain;Patient limited by fatigue Patient left: in chair;with call bell/phone within reach;with nursing/sitter in room Nurse Communication: Mobility status PT Visit Diagnosis: Unsteadiness on feet (R26.81);Other abnormalities of gait and mobility (R26.89)    Time: 0086-7619 PT Time Calculation (min) (ACUTE ONLY): 23 min   Charges:   PT Evaluation $PT Eval Moderate Complexity: 1 Mod PT Treatments $Gait Training: 8-22 mins   PT G Codes:        [place], PT, [place] 02/19/2017, 3:31 PM

## 2017-02-19 NOTE — Evaluation (Signed)
Clinical/Bedside Swallow Evaluation Patient Details  Name: Ashley Riddle MRN: [id number] Date of Birth: December 09, 1945  Today's Date: 02/19/2017 Time: SLP Start Time (ACUTE ONLY): 0815 SLP Stop Time (ACUTE ONLY): 0844 SLP Time Calculation (min) (ACUTE ONLY): 29 min  Past Medical History:  Past Medical History:  Diagnosis Date  . Anxiety   . Arthritis   . Constipation   . DDD (degenerative disc disease), cervical   . DDD (degenerative disc disease), lumbar   . Gout   . Heart murmur    probable bicuspid AV with mild AS, mild MR by 04/22/14 Echo (Dr. Montez Morita)  . Hepatitis C    treated 2016   . History of blood transfusion   . HTN (hypertension)   . Hypertension   . Pneumonia 12/02/13   Past Surgical History:  Past Surgical History:  Procedure Laterality Date  . ABDOMINAL HYSTERECTOMY    . APPENDECTOMY    . BACK SURGERY    . CESAREAN SECTION     x 3  . COLONOSCOPY W/ POLYPECTOMY    . HARDWARE REMOVAL Left 12/26/2015   Procedure: REMOVAL K-WIRE LEFT SCAPULA;  Surgeon: Garald Balding, MD;  Location: [facility];  Service: Orthopedics;  Laterality: Left;  . INNER EAR SURGERY     blood vessel  . TONSILLECTOMY    . TOTAL SHOULDER ARTHROPLASTY Left 12/27/2014   Procedure: TOTAL SHOULDER ARTHROPLASTY;  Surgeon: Garald Balding, MD;  Location: [facility];  Service: Orthopedics;  Laterality: Left;   HPI:  71 yo female adm to [facility] with CAP. PMH + for Hep C tx 2016, anxiety, cirrhosis, substance abuse, constipation, heart murmur, LE pain.  Pt shown to have probable aspiration pna on CT Abdomen 9/18.  Swallow evaluation ordered.   Pt denies dysphagia but does admit to reflux occasionally for which she takes mustard.  Pt reports problems with drooling from the left side of her mouth with sudden onset a few months ago.  She also admits food/drink does not please her, ? gustatory changes with illness.    Assessment / Plan / Recommendation Clinical Impression  Pt presents with no clinical indications  of oropharyngeal dysphagia with minimal po she would accept.  3 ounce water test administered with pt passing easily.  No complaint of pharyngeal residuals nor "choking" with intake.  Recommend continue regular/thin diet with general precautions.  Did advise pt to speak to MD re: her drooling (even during the day) for several months now- she reports sudden onset.  In addition, pt also consumes mustard for intermittent reflux symptoms.  No SlP follow up indicated.       Aspiration Risk  Mild aspiration risk (due to dyspnea)    Diet Recommendation Regular;Thin liquid   Liquid Administration via: Cup;Straw Medication Administration: Whole meds with liquid Supervision: Patient able to self feed Compensations: Slow rate;Small sips/bites    Other  Recommendations Oral Care Recommendations: Oral care BID   Follow up Recommendations None      Frequency and Duration            Prognosis        Swallow Study   General Date of Onset: 02/19/17 HPI: 71 yo female adm to [facility] with CAP. PMH + for Hep C tx 2016, anxiety, cirrhosis, substance abuse, constipation, heart murmur, LE pain.  Pt shown to have probable aspiration pna on CT Abdomen 9/18.  Swallow evaluation ordered.   Pt denies dysphagia but does admit to reflux occasionally for which she takes mustard.  Pt reports problems with drooling from the left side of her mouth with sudden onset a few months ago.  She also admits food/drink does not please her, ? gustatory changes with illness.  Type of Study: Bedside Swallow Evaluation Diet Prior to this Study: Regular;Thin liquids Temperature Spikes Noted: Yes (101.6) Respiratory Status: Room air History of Recent Intubation: No Behavior/Cognition: Alert;Cooperative Oral Cavity Assessment: Other (comment) (minimal viscous excessive secretions ) Oral Care Completed by SLP: Other (Comment) (assisted by SLP to clean her dentures/oral cavity before breakfast) Vision: Functional for  self-feeding Self-Feeding Abilities: Able to feed self Patient Positioning: Upright in bed Baseline Vocal Quality: Hoarse (excessive hoarseness) Volitional Cough: Strong (productive) Volitional Swallow: Able to elicit    Oral/Motor/Sensory Function Overall Oral Motor/Sensory Function: Within functional limits (labial weakness tested and was adequate)   Ice Chips Ice chips: Not tested   Thin Liquid Thin Liquid: Within functional limits Presentation: Cup;Self Fed    Nectar Thick Nectar Thick Liquid: Not tested   Honey Thick Honey Thick Liquid: Not tested   Puree Puree: Not tested   Solid   GO   Solid: Within functional limits Presentation: Self Fredirick Lathe 02/19/2017,8:53 AM  Luanna Salk, [facility] SLP [phone]

## 2017-02-19 NOTE — Progress Notes (Signed)
Initial Nutrition Assessment  DOCUMENTATION CODES:   Obesity unspecified  INTERVENTION:   -Boost Breeze TID (provides 250 kcals and 9 grams of protein per 8 oz bottle)   NUTRITION DIAGNOSIS:   Increased nutrient needs related to acute illness (pneumonia) as evidenced by estimated needs.  GOAL:   Patient will meet greater than or equal to 90% of their needs  MONITOR:   PO intake, Supplement acceptance, Labs, Weight trends, Skin  REASON FOR ASSESSMENT:   Consult Assessment of nutrition requirement/status  ASSESSMENT:    71 year old female presented with pneumonia and fever on 9/18. PMH of Hepatitis C, cirrhosis of the liver, and substance abuse.  Pt reported a poor appetite and no desire to eat since admission. PTA pt reports poor PO intake "grazing" or eating food here and there at home. Pt unable to determine duration of poor PO intake, stating it's been a "while".  When pt is not feeling up to eating much, she eats grits and drinks Sprite.  Pt does not drink nutritional supplements at home stating that she "doesn't like them because they cause belching". Pt reports that she doesn't eat foods containing wheat. Reports that pancakes were served for breakfast and she refused to eat because of wheat content. Encouraged pt to communicate this preference with the Nutrition Ambassador when ordering meals.  Pt ordered a chicken salad for lunch and pork chop for dinner. Pt seems to look forward to upcoming meals. Pt agreeable to try Boost Breeze. Per chart weight has ranged around 170 lbs since 05/2014.  Medications: ABX, Colace, MVI  Labs: K 3.3 (L), Mg 1.6 (L), Phos 2.3 (L)  Nutrition Focused Physical Exam Findings: No muscle depletion or fat loss.  Diet Order:  Diet Heart Room service appropriate? Yes; Fluid consistency: Thin  Skin:  Reviewed, no issues  Last BM:  02/17/17  Height:   Ht Readings from Last 1 Encounters:  02/18/17 5\' 2"  (1.575 m)    Weight:   Wt  Readings from Last 1 Encounters:  02/18/17 179 lb 8 oz (81.4 kg)    Ideal Body Weight:  50 kg  BMI:  Body mass index is 32.83 kg/m.  Estimated Nutritional Needs:   Kcal:  1800-2000 kcals per day  Protein:  85-100 grams of protein  Fluid:  1.5-1.7 L  EDUCATION NEEDS:   No education needs identified at this time  [place] Intern Pager: [phone] 02/19/2017 1:44 PM

## 2017-02-19 NOTE — Progress Notes (Signed)
PROGRESS NOTE    Ashley Riddle  KYH:[id number] DOB: 03-10-1946 DOA: 02/18/2017 PCP: Audley Hose, MD   Chief Complaint  Patient presents with  . Altered Mental Status  . Fever    Brief Narrative:  HPI on 02/18/2017 by Dr. Irwin Brakeman Ashley Riddle is a 71 y.o. female with history of treated hepatitis C, chronic anxiety and cirrhosis of the liver, history of multiple recreational substance abuse presented to the ED complaining of 4 days of progressive generalized weakness and fever. The patient reports a productive cough with yellow sputum. Her family reports slowed mentation with less interactivity. She has not been confused. She denies having a history of hepatic encephalopathy. She is followed closely by a hepatologist. She reports that her liver has been stable. She has chronic low back pain. She reports increasing abdominal discomfort and nausea. She has had poor appetite and was recently started on gabapentin by her primary care provider 2 weeks ago.  Assessment & Plan   Sepsis secondary to community acquired pneumonia -Patient began with fever, tachycardia, tachypnea, leukocytosis -Chest x-ray unremarkable -CT abdomen and pelvis showed no acute intra-abdominal process, nodular liver contour suggestive of cirrhosis. Patchy opacities in the right lower lobe consistent with aspiration and/or pneumonia -Placed on levaquin -patient complains of generalized aches/pains, will order Influenza PCR -Blood cultures pending -Urine strep pneumonia antigen negative  Hypokalemia/hypomagnesemia -Replacing to monitor BMP, magnesium  Liver cirrhosis with hyperammonemia -Ammonia level improving, currently 32  Thrombocytopenia -Upon review of Epic, patient has had low platelets back in 2011 -Platelets currently 138, continue to monitor CBC  Depression/generalized anxiety -Continue duloxetine, Ativan as needed  Opioid-induced constipation -Continue lactulose  Essential  hypertension -Continue amlodipine  Mild dehydration -Likely secondary to sepsis History of recreational drug abuse -Urine Drug screen: Positive for benzos, opiates, marijuana  Chronic pain -Patient states she has chronic lower back pain takes pain medicines regularly -Continue pain control  Obesity -Nutrition consulted -BMI 32.82 -Patient unable to speak to her PCP regarding last saw modifications  DVT Prophylaxis  heparin  Code Status: Full  Family Communication: None at bedside  Disposition Plan: Admitted  Consultants None  Procedures  None  Antibiotics   Anti-infectives    Start     Dose/Rate Route Frequency Ordered Stop   02/19/17 1800  levofloxacin (LEVAQUIN) IVPB 750 mg  Status:  Discontinued     750 mg 100 mL/hr over 90 Minutes Intravenous Every 24 hours 02/18/17 2120 02/18/17 2210   02/19/17 0800  levofloxacin (LEVAQUIN) IVPB 750 mg     750 mg 100 mL/hr over 90 Minutes Intravenous Every 24 hours 02/18/17 2210 02/24/17 0759   02/18/17 1415  vancomycin (VANCOCIN) IVPB 1000 mg/200 mL premix     1,000 mg 200 mL/hr over 60 Minutes Intravenous  Once 02/18/17 1408 02/18/17 1840   02/18/17 1415  ciprofloxacin (CIPRO) IVPB 400 mg     400 mg 200 mL/hr over 60 Minutes Intravenous  Once 02/18/17 1408 02/18/17 1840      Subjective:   Sinclair Grooms seen and examined today.  Patient complains of cough with brown sputum production. Complains of feeling generally weak and tired. Complains of chronic pain. Denies chest pain, abdominal pain, nausea or vomiting, diarrhea or constipation.  Objective:   Vitals:   02/18/17 2052 02/18/17 2120 02/19/17 0116 02/19/17 0529  BP:  (!) 104/49 114/69 111/70  Pulse:  88 99 87  Resp:  16 16 16   Temp: 98.5 F (36.9 C) 99.6 F (37.6 C)  99 F (37.2 C) 98.6 F (37 C)  TempSrc: Oral Oral Oral Oral  SpO2:  97% 94% 94%  Weight:  81.4 kg (179 lb 8 oz)    Height:  5\' 2"  (1.575 m)      Intake/Output Summary (Last 24 hours) at  02/19/17 1326 Last data filed at 02/19/17 1100  Gross per 24 hour  Intake              640 ml  Output             1350 ml  Net             -710 ml   Filed Weights   02/18/17 2120  Weight: 81.4 kg (179 lb 8 oz)    Exam  General: Well developed, well nourished, NAD, appears stated age  HEENT: NCAT, mucous membranes moist.   Cardiovascular: S1 S2 auscultated, RRR  Respiratory: Diminished right lung base, otherwise clear  Abdomen: Soft, nontender, nondistended, + bowel sounds  Extremities: warm dry without cyanosis clubbing or edema  Neuro: AAOx3, nonfocal  Psych: Normal affect and demeanor    Data Reviewed: I have personally reviewed following labs and imaging studies  CBC:  Recent Labs Lab 02/18/17 1230 02/19/17 0019  WBC 13.3* 14.9*  NEUTROABS  --  11.9*  HGB 11.7* 10.5*  HCT 36.1 31.8*  MCV 86.6 86.2  PLT 148* 854*   Basic Metabolic Panel:  Recent Labs Lab 02/18/17 1230 02/19/17 0019  NA 137 136  K 3.8 3.3*  CL 101 101  CO2 25 29  GLUCOSE 134* 105*  BUN 12 11  CREATININE 0.67 0.61  CALCIUM 9.9 9.5  MG  --  1.6*  PHOS  --  2.3*   GFR: Estimated Creatinine Clearance: 65.6 mL/min (by C-G formula based on SCr of 0.61 mg/dL). Liver Function Tests:  Recent Labs Lab 02/18/17 1230 02/19/17 0019  AST 37 25  ALT 27 22  ALKPHOS 84 82  BILITOT 0.9 0.7  PROT 7.8 7.0  ALBUMIN 3.9 3.3*   No results for input(s): LIPASE, AMYLASE in the last 168 hours.  Recent Labs Lab 02/18/17 1302 02/19/17 0026  AMMONIA 50* 32   Coagulation Profile: No results for input(s): INR, PROTIME in the last 168 hours. Cardiac Enzymes: No results for input(s): CKTOTAL, CKMB, CKMBINDEX, TROPONINI in the last 168 hours. BNP (last 3 results) No results for input(s): PROBNP in the last 8760 hours. HbA1C: No results for input(s): HGBA1C in the last 72 hours. CBG: No results for input(s): GLUCAP in the last 168 hours. Lipid Profile: No results for input(s): CHOL, HDL,  LDLCALC, TRIG, CHOLHDL, LDLDIRECT in the last 72 hours. Thyroid Function Tests: No results for input(s): TSH, T4TOTAL, FREET4, T3FREE, THYROIDAB in the last 72 hours. Anemia Panel: No results for input(s): VITAMINB12, FOLATE, FERRITIN, TIBC, IRON, RETICCTPCT in the last 72 hours. Urine analysis:    Component Value Date/Time   COLORURINE YELLOW 02/18/2017 [place] 02/18/2017 1303   LABSPEC 1.011 02/18/2017 1303   PHURINE 8.0 02/18/2017 1303   GLUCOSEU NEGATIVE 02/18/2017 1303   HGBUR NEGATIVE 02/18/2017 1303   BILIRUBINUR NEGATIVE 02/18/2017 1303   KETONESUR NEGATIVE 02/18/2017 1303   PROTEINUR NEGATIVE 02/18/2017 1303   UROBILINOGEN 0.2 08/20/2014 0554   NITRITE NEGATIVE 02/18/2017 1303   LEUKOCYTESUR NEGATIVE 02/18/2017 1303   Sepsis Labs: @LABRCNTIP (procalcitonin:4,lacticidven:4)  )No results found for this or any previous visit (from the past 240 hour(s)).    Radiology Studies: Ct Abdomen Pelvis  W Contrast  Result Date: 02/18/2017 CLINICAL DATA:  Abdominal pain and weakness. EXAM: CT ABDOMEN AND PELVIS WITH CONTRAST TECHNIQUE: Multidetector CT imaging of the abdomen and pelvis was performed using the standard protocol following bolus administration of intravenous contrast. CONTRAST:  197mL ISOVUE-300 IOPAMIDOL (ISOVUE-300) INJECTION 61% COMPARISON:  CT abdomen pelvis dated January 24, 2016. FINDINGS: Lower chest: Patchy opacities in the right lower lobe. Hepatobiliary: Nodular hepatic contour. No focal liver lesion. The gallbladder is unremarkable. No biliary dilatation. Pancreas: Unremarkable. No pancreatic ductal dilatation or surrounding inflammatory changes. Spleen: Normal in size without focal abnormality. Adrenals/Urinary Tract: The adrenal glands are unremarkable. Subcentimeter low-density lesion in the upper pole of left kidney remains too small to characterize, but is unchanged. No renal calculi or hydronephrosis. Bladder is unremarkable. Stomach/Bowel:  Stomach is within normal limits. Appendix appears normal. No evidence of bowel wall thickening, distention, or inflammatory changes. Vascular/Lymphatic: Mild aortic atherosclerosis. No enlarged abdominal or pelvic lymph nodes. Reproductive: Status post hysterectomy. No adnexal masses. Other: No free fluid or free air. Musculoskeletal: No acute or significant osseous findings. 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 IMPRESSION: 1. No acute intra-abdominal process. 2. Nodular liver contour, suggestive of cirrhosis. 3. Patchy opacities in the right lower lobe, consistent with aspiration and/or pneumonia. Electronically Signed   By: Titus Dubin M.D.   On: 02/18/2017 16:22   Dg Chest Port 1 View  Result Date: 02/18/2017 CLINICAL DATA:  Dyspnea, fever EXAM: PORTABLE CHEST 1 VIEW COMPARISON:  05/20/2016 chest radiograph. FINDINGS: Partially visualized surgical hardware in the left proximal humerus and overlying the left glenoid, unchanged. Stable cardiomediastinal silhouette with normal heart size. No pneumothorax. No pleural effusion. No pulmonary edema. No acute consolidative airspace disease. Stable calcified subcentimeter right mid lung granuloma. IMPRESSION: No active cardiopulmonary disease. Electronically Signed   By: Ilona Sorrel M.D.   On: 02/18/2017 14:26     Scheduled Meds: . amLODipine  5 mg Oral Daily  . DULoxetine  30 mg Oral Daily  . feeding supplement  1 Container Oral TID BM  . gabapentin  200 mg Oral QHS  . heparin  5,000 Units Subcutaneous Q8H  . multivitamin with minerals  1 tablet Oral Daily  . sodium chloride flush  3 mL Intravenous Q12H   Continuous Infusions: . sodium chloride    . levofloxacin (LEVAQUIN) IV Stopped (02/19/17 1046)     LOS: 1 day   Time Spent in minutes   30 minutes  Jennifier Smitherman D.O. on 02/19/2017 at 1:26 PM  Between 7am to 7pm - Pager - [phone]  After 7pm go to [email or web address] - password [email or web address]  And look for the night  coverage person covering for me after hours  [facility] Office  [phone]

## 2017-02-20 DIAGNOSIS — R7989 Other specified abnormal findings of blood chemistry: Secondary | ICD-10-CM

## 2017-02-20 LAB — CBC WITH DIFFERENTIAL/PLATELET
BASOS PCT: 0 %
Basophils Absolute: 0 10*3/uL (ref 0.0–0.1)
EOS ABS: 0.1 10*3/uL (ref 0.0–0.7)
EOS PCT: 1 %
HCT: 30.9 % — ABNORMAL LOW (ref 36.0–46.0)
Hemoglobin: 10 g/dL — ABNORMAL LOW (ref 12.0–15.0)
Lymphocytes Relative: 22 %
Lymphs Abs: 2 10*3/uL (ref 0.7–4.0)
MCH: 27.5 pg (ref 26.0–34.0)
MCHC: 32.4 g/dL (ref 30.0–36.0)
MCV: 85.1 fL (ref 78.0–100.0)
MONO ABS: 0.6 10*3/uL (ref 0.1–1.0)
MONOS PCT: 7 %
Neutro Abs: 6.3 10*3/uL (ref 1.7–7.7)
Neutrophils Relative %: 70 %
PLATELETS: 134 10*3/uL — AB (ref 150–400)
RBC: 3.63 MIL/uL — ABNORMAL LOW (ref 3.87–5.11)
RDW: 12.2 % (ref 11.5–15.5)
WBC: 9 10*3/uL (ref 4.0–10.5)

## 2017-02-20 LAB — COMPREHENSIVE METABOLIC PANEL
ALT: 18 U/L (ref 14–54)
AST: 20 U/L (ref 15–41)
Albumin: 3.2 g/dL — ABNORMAL LOW (ref 3.5–5.0)
Alkaline Phosphatase: 74 U/L (ref 38–126)
Anion gap: 8 (ref 5–15)
BILIRUBIN TOTAL: 0.7 mg/dL (ref 0.3–1.2)
BUN: 5 mg/dL — AB (ref 6–20)
CO2: 24 mmol/L (ref 22–32)
CREATININE: 0.48 mg/dL (ref 0.44–1.00)
Calcium: 9.3 mg/dL (ref 8.9–10.3)
Chloride: 103 mmol/L (ref 101–111)
Glucose, Bld: 105 mg/dL — ABNORMAL HIGH (ref 65–99)
Potassium: 3.5 mmol/L (ref 3.5–5.1)
Sodium: 135 mmol/L (ref 135–145)
TOTAL PROTEIN: 6.7 g/dL (ref 6.5–8.1)

## 2017-02-20 LAB — LEGIONELLA PNEUMOPHILA SEROGP 1 UR AG: L. pneumophila Serogp 1 Ur Ag: NEGATIVE

## 2017-02-20 LAB — MAGNESIUM: MAGNESIUM: 1.6 mg/dL — AB (ref 1.7–2.4)

## 2017-02-20 LAB — AMMONIA: AMMONIA: 36 umol/L — AB (ref 9–35)

## 2017-02-20 LAB — PHOSPHORUS: Phosphorus: 2.6 mg/dL (ref 2.5–4.6)

## 2017-02-20 MED ORDER — MAGNESIUM SULFATE 2 GM/50ML IV SOLN
2.0000 g | Freq: Once | INTRAVENOUS | Status: AC
  Administered 2017-02-20: 2 g via INTRAVENOUS
  Filled 2017-02-20 (×2): qty 50

## 2017-02-20 NOTE — Progress Notes (Signed)
PROGRESS NOTE    NICHOLETTE Riddle  YTK:[id number] DOB: June 14, 1945 DOA: 02/18/2017 PCP: Audley Hose, MD   Chief Complaint  Patient presents with  . Altered Mental Status  . Fever    Brief Narrative:  HPI on 02/18/2017 by Dr. Irwin Brakeman Ashley Riddle is a 71 y.o. female with history of treated hepatitis C, chronic anxiety and cirrhosis of the liver, history of multiple recreational substance abuse presented to the ED complaining of 4 days of progressive generalized weakness and fever. The patient reports a productive cough with yellow sputum. Her family reports slowed mentation with less interactivity. She has not been confused. She denies having a history of hepatic encephalopathy. She is followed closely by a hepatologist. She reports that her liver has been stable. She has chronic low back pain. She reports increasing abdominal discomfort and nausea. She has had poor appetite and was recently started on gabapentin by her primary care provider 2 weeks ago.  Assessment & Plan   Sepsis secondary to community acquired pneumonia -Patient began with fever, tachycardia, tachypnea, leukocytosis- VSS improving  -Chest x-ray unremarkable -CT abdomen and pelvis showed no acute intra-abdominal process, nodular liver contour suggestive of cirrhosis. Patchy opacities in the right lower lobe consistent with aspiration and/or pneumonia -Continue levaquin -patient complains of generalized aches/pains, influenza PCR ordered and negative -Blood cultures shows no growth to date -Urine strep pneumonia antigen negative  Hypokalemia/hypomagnesemia -Continue to replace and monitor  Liver cirrhosis with hyperammonemia -Ammonia level improving, currently 36  Thrombocytopenia -Upon review of Epic, patient has had low platelets back in 2011 -Platelets currently 134, continue to monitor CBC  Depression/generalized anxiety -Continue duloxetine, Ativan as needed  Opioid-induced  constipation -Continue lactulose  Essential hypertension -Continue amlodipine  Mild dehydration -Likely secondary to sepsis  History of recreational drug abuse -Urine Drug screen: Positive for benzos, opiates, marijuana -discussed cessation  Chronic pain -Patient states she has chronic lower back pain takes pain medicines regularly -Continue pain control  Obesity -Nutrition consulted -BMI 32.82 -Patient unable to speak to her PCP regarding last saw modifications  DVT Prophylaxis  heparin  Code Status: Full  Family Communication: None at bedside  Disposition Plan: Admitted. Possible discharge to home on 02/21/17  Consultants None  Procedures  None  Antibiotics   Anti-infectives    Start     Dose/Rate Route Frequency Ordered Stop   02/19/17 1800  levofloxacin (LEVAQUIN) IVPB 750 mg  Status:  Discontinued     750 mg 100 mL/hr over 90 Minutes Intravenous Every 24 hours 02/18/17 2120 02/18/17 2210   02/19/17 0800  levofloxacin (LEVAQUIN) IVPB 750 mg     750 mg 100 mL/hr over 90 Minutes Intravenous Every 24 hours 02/18/17 2210 02/24/17 0759   02/18/17 1415  vancomycin (VANCOCIN) IVPB 1000 mg/200 mL premix     1,000 mg 200 mL/hr over 60 Minutes Intravenous  Once 02/18/17 1408 02/18/17 1840   02/18/17 1415  ciprofloxacin (CIPRO) IVPB 400 mg     400 mg 200 mL/hr over 60 Minutes Intravenous  Once 02/18/17 1408 02/18/17 1840      Subjective:   Sinclair Grooms seen and examined today.  Continues to complain of back and knee pain. Feels weak. Denies chest pain, shortness of breath, abdominal pain, nausea, vomiting, diarrhea, constipation.   Objective:   Vitals:   02/20/17 0136 02/20/17 0539 02/20/17 1000 02/20/17 1418  BP: 135/78 131/70 (!) 149/76 (!) 117/59  Pulse: 82 79 96 79  Resp: 18 18 16  15  Temp: 98.6 F (37 C) 98.4 F (36.9 C) 98.3 F (36.8 C) 98.3 F (36.8 C)  TempSrc: Oral Oral Oral Oral  SpO2: 97% 94% 100% 100%  Weight:      Height:         Intake/Output Summary (Last 24 hours) at 02/20/17 1530 Last data filed at 02/20/17 0300  Gross per 24 hour  Intake              390 ml  Output                0 ml  Net              390 ml   Filed Weights   02/18/17 2120  Weight: 81.4 kg (179 lb 8 oz)   Exam  General: Well developed, well nourished, NAD, appears stated age  31: NCAT,  mucous membranes moist.   Cardiovascular: S1 S2 auscultated, RRR  Respiratory: diminished breath sounds  Abdomen: Soft, nontender, nondistended, + bowel sounds  Extremities: warm dry without cyanosis clubbing or edema  Neuro: AAOx3, nonfocal  Psych: Appropriate mood and affect  Data Reviewed: I have personally reviewed following labs and imaging studies  CBC:  Recent Labs Lab 02/18/17 1230 02/19/17 0019 02/20/17 0343  WBC 13.3* 14.9* 9.0  NEUTROABS  --  11.9* 6.3  HGB 11.7* 10.5* 10.0*  HCT 36.1 31.8* 30.9*  MCV 86.6 86.2 85.1  PLT 148* 138* 433*   Basic Metabolic Panel:  Recent Labs Lab 02/18/17 1230 02/19/17 0019 02/20/17 0343  NA 137 136 135  K 3.8 3.3* 3.5  CL 101 101 103  CO2 25 29 24   GLUCOSE 295* 188* 105*  BUN 12 11 5*  CREATININE 0.67 0.61 0.48  CALCIUM 9.9 9.5 9.3  MG  --  1.6* 1.6*  PHOS  --  2.3* 2.6   GFR: Estimated Creatinine Clearance: 65.6 mL/min (by C-G formula based on SCr of 0.48 mg/dL). Liver Function Tests:  Recent Labs Lab 02/18/17 1230 02/19/17 0019 02/20/17 0343  AST 37 25 20  ALT 27 22 18   ALKPHOS 84 82 74  BILITOT 0.9 0.7 0.7  PROT 7.8 7.0 6.7  ALBUMIN 3.9 3.3* 3.2*   No results for input(s): LIPASE, AMYLASE in the last 168 hours.  Recent Labs Lab 02/18/17 1302 02/19/17 0026 02/20/17 0343  AMMONIA 50* 32 36*   Coagulation Profile: No results for input(s): INR, PROTIME in the last 168 hours. Cardiac Enzymes: No results for input(s): CKTOTAL, CKMB, CKMBINDEX, TROPONINI in the last 168 hours. BNP (last 3 results) No results for input(s): PROBNP in the last 8760  hours. HbA1C: No results for input(s): HGBA1C in the last 72 hours. CBG: No results for input(s): GLUCAP in the last 168 hours. Lipid Profile: No results for input(s): CHOL, HDL, LDLCALC, TRIG, CHOLHDL, LDLDIRECT in the last 72 hours. Thyroid Function Tests: No results for input(s): TSH, T4TOTAL, FREET4, T3FREE, THYROIDAB in the last 72 hours. Anemia Panel: No results for input(s): VITAMINB12, FOLATE, FERRITIN, TIBC, IRON, RETICCTPCT in the last 72 hours. Urine analysis:    Component Value Date/Time   COLORURINE YELLOW 02/18/2017 [place] 02/18/2017 1303   LABSPEC 1.011 02/18/2017 1303   PHURINE 8.0 02/18/2017 1303   GLUCOSEU NEGATIVE 02/18/2017 1303   HGBUR NEGATIVE 02/18/2017 1303   BILIRUBINUR NEGATIVE 02/18/2017 1303   KETONESUR NEGATIVE 02/18/2017 1303   PROTEINUR NEGATIVE 02/18/2017 1303   UROBILINOGEN 0.2 08/20/2014 0554   NITRITE NEGATIVE 02/18/2017 1303   LEUKOCYTESUR  NEGATIVE 02/18/2017 1303   Sepsis Labs: @LABRCNTIP (procalcitonin:4,lacticidven:4)  ) Recent Results (from the past 240 hour(s))  Culture, blood (single)     Status: None (Preliminary result)   Collection Time: 02/18/17 12:09 PM  Result Value Ref Range Status   Specimen Description BLOOD RIGHT HAND  Final   Special Requests IN PEDIATRIC BOTTLE Blood Culture adequate volume  Final   Culture NO GROWTH 2 DAYS  Final   Report Status PENDING  Incomplete  Culture, blood (routine x 2) Call MD if unable to obtain prior to antibiotics being given     Status: None (Preliminary result)   Collection Time: 02/18/17  9:38 PM  Result Value Ref Range Status   Specimen Description BLOOD RIGHT FOREARM  Final   Special Requests   Final    BOTTLES DRAWN AEROBIC ONLY Blood Culture adequate volume   Culture NO GROWTH 1 DAY  Final   Report Status PENDING  Incomplete      Radiology Studies: Ct Abdomen Pelvis W Contrast  Result Date: 02/18/2017 CLINICAL DATA:  Abdominal pain and weakness. EXAM: CT  ABDOMEN AND PELVIS WITH CONTRAST TECHNIQUE: Multidetector CT imaging of the abdomen and pelvis was performed using the standard protocol following bolus administration of intravenous contrast. CONTRAST:  119mL ISOVUE-300 IOPAMIDOL (ISOVUE-300) INJECTION 61% COMPARISON:  CT abdomen pelvis dated January 24, 2016. FINDINGS: Lower chest: Patchy opacities in the right lower lobe. Hepatobiliary: Nodular hepatic contour. No focal liver lesion. The gallbladder is unremarkable. No biliary dilatation. Pancreas: Unremarkable. No pancreatic ductal dilatation or surrounding inflammatory changes. Spleen: Normal in size without focal abnormality. Adrenals/Urinary Tract: The adrenal glands are unremarkable. Subcentimeter low-density lesion in the upper pole of left kidney remains too small to characterize, but is unchanged. No renal calculi or hydronephrosis. Bladder is unremarkable. Stomach/Bowel: Stomach is within normal limits. Appendix appears normal. No evidence of bowel wall thickening, distention, or inflammatory changes. Vascular/Lymphatic: Mild aortic atherosclerosis. No enlarged abdominal or pelvic lymph nodes. Reproductive: Status post hysterectomy. No adnexal masses. Other: No free fluid or free air. Musculoskeletal: No acute or significant osseous findings. 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 IMPRESSION: 1. No acute intra-abdominal process. 2. Nodular liver contour, suggestive of cirrhosis. 3. Patchy opacities in the right lower lobe, consistent with aspiration and/or pneumonia. Electronically Signed   By: Titus Dubin M.D.   On: 02/18/2017 16:22     Scheduled Meds: . amLODipine  5 mg Oral Daily  . DULoxetine  30 mg Oral Daily  . feeding supplement  1 Container Oral TID BM  . gabapentin  200 mg Oral QHS  . heparin  5,000 Units Subcutaneous Q8H  . multivitamin with minerals  1 tablet Oral Daily  . sodium chloride flush  3 mL Intravenous Q12H   Continuous Infusions: . sodium chloride     . levofloxacin (LEVAQUIN) IV Stopped (02/20/17 1104)     LOS: 2 days   Time Spent in minutes   30 minutes  Maryum Batterson D.O. on 02/20/2017 at 3:30 PM  Between 7am to 7pm - Pager - [phone]  After 7pm go to [email or web address] - password [email or web address]  And look for the night coverage person covering for me after hours  [facility] Office  [phone]

## 2017-02-20 NOTE — Progress Notes (Signed)
Physical Therapy Treatment Patient Details Name: Ashley Riddle MRN: [id number] DOB: Nov 07, 1945 Today's Date: 02/20/2017    History of Present Illness Pt is a 71 y/o female admitted secondary to fever, AMS and found to have CAP. PMH including but not limited to Hep C, HTN, cirrhosis and hx of polysubstance abuse.    PT Comments    Pt making steady progress towards achieving her current functional mobility goals. Pt would likely benefit from using an AD to ambulate; however, she is refusing at this time and instead pushing the IV pole and reaching for handrails in the hallway. Pt would continue to benefit from skilled physical therapy services at this time while admitted and after d/c to address the below listed limitations in order to improve overall safety and independence with functional mobility.    Follow Up Recommendations  Home health PT;Supervision/Assistance - 24 hour     Equipment Recommendations  Other (comment) (would benefit from use of RW but pt refusing)    Recommendations for Other Services       Precautions / Restrictions Restrictions Weight Bearing Restrictions: No    Mobility  Bed Mobility               General bed mobility comments: Pt OOB in recliner chair upon arrival  Transfers Overall transfer level: Needs assistance Equipment used: None Transfers: Sit to/from Stand Sit to Stand: Min guard         General transfer comment: increased time and effort, close min guard for safety  Ambulation/Gait Ambulation/Gait assistance: Min guard;Min assist Ambulation Distance (Feet): 75 Feet Assistive device:  (pushed IV pole and holding onto handrail in hallway) Gait Pattern/deviations: Step-through pattern;Decreased step length - left;Decreased step length - right;Decreased stride length;Antalgic Gait velocity: decreased Gait velocity interpretation: Below normal speed for age/gender General Gait Details: moderate antalgic gait (pt with reported  chronic knee pain), requiring close min guard to min A for stability and support   Stairs            Wheelchair Mobility    Modified Rankin (Stroke Patients Only)       Balance Overall balance assessment: Needs assistance Sitting-balance support: No upper extremity supported;Feet supported Sitting balance-Leahy Scale: Good     Standing balance support: During functional activity;No upper extremity supported Standing balance-Leahy Scale: Fair Standing balance comment: static standing is fair without UE support with close min guard for safety                            Cognition Arousal/Alertness: Awake/alert Behavior During Therapy: WFL for tasks assessed/performed Overall Cognitive Status: Impaired/Different from baseline Area of Impairment: Safety/judgement                         Safety/Judgement: Decreased awareness of safety;Decreased awareness of deficits            Exercises      General Comments        Pertinent Vitals/Pain Pain Assessment: No/denies pain    Home Living                      Prior Function            PT Goals (current goals can now be found in the care plan section) Acute Rehab PT Goals PT Goal Formulation: With patient Time For Goal Achievement: 03/05/17 Potential to Achieve Goals: Good Progress towards PT goals:  Progressing toward goals    Frequency    Min 3X/week      PT Plan Discharge plan needs to be updated    Co-evaluation              AM-PAC PT "6 Clicks" Daily Activity  Outcome Measure  Difficulty turning over in bed (including adjusting bedclothes, sheets and blankets)?: A Lot Difficulty moving from lying on back to sitting on the side of the bed? : A Lot Difficulty sitting down on and standing up from a chair with arms (e.g., wheelchair, bedside commode, etc,.)?: A Lot Help needed moving to and from a bed to chair (including a wheelchair)?: A Little Help needed  walking in hospital room?: A Little Help needed climbing 3-5 steps with a railing? : A Lot 6 Click Score: 14    End of Session Equipment Utilized During Treatment: Gait belt Activity Tolerance: Patient limited by pain;Patient limited by fatigue Patient left: in chair;with call bell/phone within reach Nurse Communication: Mobility status PT Visit Diagnosis: Unsteadiness on feet (R26.81);Other abnormalities of gait and mobility (R26.89)     Time: 6468-0321 PT Time Calculation (min) (ACUTE ONLY): 14 min  Charges:  $Gait Training: 8-22 mins                    G Codes:       [place], [place], [place] Groom 02/20/2017, 11:09 AM

## 2017-02-20 NOTE — Progress Notes (Addendum)
Error

## 2017-02-20 NOTE — Care Management Note (Addendum)
Case Management Note  Patient Details  Name: LAKEISHIA TRULUCK MRN: [id number] Date of Birth: 1945/10/11  Subjective/Objective:                    Action/Plan: PT/OT recommending [facility] services. Pt and daughter refusing and would like the patient to go to outpatient therapy. CM notified MD and she is in agreement with outpatient therapy. CM inquired about patient going to [facility] and they were in agreement . Orders in EPIC and information on the AVS. OT recommending a 3 in 1. Pt and daughter refusing. Family to provide transportation home.  Expected Discharge Date:                  Expected Discharge Plan:     In-House Referral:     Discharge planning Services     Post Acute Care Choice:    Choice offered to:     DME Arranged:    DME Agency:     HH Arranged:    HH Agency:     Status of Service:  In process, will continue to follow  If discussed at Long Length of Stay Meetings, dates discussed:    Additional Comments:  Pollie Friar, RN 02/20/2017, 11:25 AM

## 2017-02-21 ENCOUNTER — Encounter (HOSPITAL_COMMUNITY)
Admission: EM | Disposition: A | Discharge status: Home / Self Care | Payer: Self-pay | Source: Home / Self Care | Attending: Internal Medicine

## 2017-02-21 LAB — CBC WITH DIFFERENTIAL/PLATELET
BASOS ABS: 0 10*3/uL (ref 0.0–0.1)
Basophils Relative: 0 %
Eosinophils Absolute: 0.1 10*3/uL (ref 0.0–0.7)
Eosinophils Relative: 1 %
HEMATOCRIT: 31.5 % — AB (ref 36.0–46.0)
HEMOGLOBIN: 10.2 g/dL — AB (ref 12.0–15.0)
Lymphocytes Relative: 22 %
Lymphs Abs: 1.5 10*3/uL (ref 0.7–4.0)
MCH: 27.4 pg (ref 26.0–34.0)
MCHC: 32.4 g/dL (ref 30.0–36.0)
MCV: 84.7 fL (ref 78.0–100.0)
MONO ABS: 0.6 10*3/uL (ref 0.1–1.0)
MONOS PCT: 9 %
NEUTROS ABS: 4.6 10*3/uL (ref 1.7–7.7)
NEUTROS PCT: 68 %
Platelets: 150 10*3/uL (ref 150–400)
RBC: 3.72 MIL/uL — ABNORMAL LOW (ref 3.87–5.11)
RDW: 12 % (ref 11.5–15.5)
WBC: 6.8 10*3/uL (ref 4.0–10.5)

## 2017-02-21 LAB — COMPREHENSIVE METABOLIC PANEL
ALBUMIN: 3.3 g/dL — AB (ref 3.5–5.0)
ALT: 20 U/L (ref 14–54)
ANION GAP: 8 (ref 5–15)
AST: 23 U/L (ref 15–41)
Alkaline Phosphatase: 77 U/L (ref 38–126)
BUN: 6 mg/dL (ref 6–20)
CO2: 25 mmol/L (ref 22–32)
Calcium: 9.5 mg/dL (ref 8.9–10.3)
Chloride: 102 mmol/L (ref 101–111)
Creatinine, Ser: 0.53 mg/dL (ref 0.44–1.00)
GFR calc Af Amer: >60 mL/min (ref >60)
GFR calc non Af Amer: >60 mL/min (ref >60)
GLUCOSE: 115 mg/dL — AB (ref 65–99)
POTASSIUM: 3.6 mmol/L (ref 3.5–5.1)
SODIUM: 135 mmol/L (ref 135–145)
Total Bilirubin: 0.6 mg/dL (ref 0.3–1.2)
Total Protein: 6.8 g/dL (ref 6.5–8.1)

## 2017-02-21 LAB — EXPECTORATED SPUTUM ASSESSMENT W GRAM STAIN, RFLX TO RESP C

## 2017-02-21 LAB — AMMONIA: Ammonia: 28 umol/L (ref 9–35)

## 2017-02-21 LAB — EXPECTORATED SPUTUM ASSESSMENT W REFEX TO RESP CULTURE

## 2017-02-21 SURGERY — ECHOCARDIOGRAM, TRANSESOPHAGEAL
Anesthesia: Monitor Anesthesia Care

## 2017-02-21 MED ORDER — BOOST / RESOURCE BREEZE PO LIQD: 1.0000 | Freq: Three times a day (TID) | ORAL | 0 refills | Status: DC

## 2017-02-21 MED ORDER — LEVOFLOXACIN 750 MG PO TABS
750.0000 mg | ORAL_TABLET | Freq: Every day | ORAL | 0 refills | Status: AC
Start: 2017-02-21 — End: 2017-02-25

## 2017-02-21 NOTE — Discharge Instructions (Signed)

## 2017-02-21 NOTE — Care Management Note (Signed)
Case Management Note Original Note Created Pollie Friar, RN 02/20/2017, 11:25 AM    Patient Details  Name: Ashley Riddle MRN: [id number] Date of Birth: July 28, 1945  Subjective/Objective:                    Action/Plan: PT/OT recommending [facility] services. Pt and daughter refusing and would like the patient to go to outpatient therapy. CM notified MD and she is in agreement with outpatient therapy. CM inquired about patient going to [facility] and they were in agreement . Orders in EPIC and information on the AVS. OT recommending a 3 in 1. Pt and daughter refusing. Family to provide transportation home.  Expected Discharge Date:  02/21/17               Expected Discharge Plan:  OP Rehab  In-House Referral:  NA  Discharge planning Services  CM Consult  Post Acute Care Choice:  Durable Medical Equipment Choice offered to:  Patient, Adult Children  DME Arranged:  3-N-1, Patient refused services DME Agency:     HH Arranged:  NA HH Agency:  NA  Status of Service:  Completed, signed off  If discussed at [place] of Stay Meetings, dates discussed:    Discharge Disposition: home/self care   Additional Comments:  02/21/17- 1100- Challen Spainhour RN, CM- pt for d/c home today- no further CM needs noted.   Dahlia Client [place], RN 02/21/2017, 11:17 AM [phone]

## 2017-02-21 NOTE — Discharge Summary (Signed)
Physician Discharge Summary  Ashley Riddle YSA:[id number] DOB: 1945/07/17 DOA: 02/18/2017  PCP: Audley Hose, MD  Admit date: 02/18/2017 Discharge date: 02/21/2017  Time spent: 45 minutes  Recommendations for Outpatient Follow-up:  Patient will be discharged to home, with outpatient physical therapy.  Patient will need to follow up with primary care provider within one week of discharge, repeat BMP and magnesium; repeat CXR in 3-4 weeks.  Patient should continue medications as prescribed.  Patient should follow a heart healthy diet.   Discharge Diagnoses:  Sepsis secondary to community acquired pneumonia Hypokalemia/hypomagnesemia Liver cirrhosis with hyperammonemia Thrombocytopenia Depression/generalized anxiety Opioid-induced constipation Essential hypertension Mild dehydration History of recreational drug abuse Chronic pain Obesity  Discharge Condition: Stable  Diet recommendation: heart healthy  Filed Weights   02/18/17 2120  Weight: 81.4 kg (179 lb 8 oz)    History of present illness:  on 02/18/2017 by Dr. Ellwood Dense Thompsonis a 71 y.o.femalewith history of treated hepatitis C, chronic anxiety and cirrhosis of the liver, history of multiple recreational substance abuse presented to the ED complaining of 4 days of progressive generalized weakness and fever. The patient reports a productive cough with yellow sputum. Her family reports slowed mentation with less interactivity. She has not been confused. She denies having a history of hepatic encephalopathy. She is followed closely by a hepatologist. She reports that her liver has been stable. She has chronic low back pain. She reports increasing abdominal discomfort and nausea. She has had poor appetite and was recently started on gabapentin by her primary care provider 2 weeks ago.  Hospital Course:  Sepsis secondary to community acquired pneumonia -Patient began with fever, tachycardia, tachypnea,  leukocytosis- VSS improving  -Chest x-ray unremarkable -CT abdomen and pelvis showed no acute intra-abdominal process, nodular liver contour suggestive of cirrhosis. Patchy opacities in the right lower lobe consistent with aspiration and/or pneumonia -Continue levaquin -patient complains of generalized aches/pains, influenza PCR ordered and negative -Blood cultures shows no growth to date -Urine strep pneumonia antigen negative  Hypokalemia/hypomagnesemia -Resolved with replacement, K 3.6 -repeat BMP and magnesium in one week  Liver cirrhosis with hyperammonemia -Ammonia level improving, currently 28  Thrombocytopenia -Upon review of Epic, patient has had low platelets back in 2011 -platelets 150  Depression/generalized anxiety -Continue duloxetine, Ativan as needed  Opioid-induced constipation -Continue lactulose  Essential hypertension -Continue amlodipine  Mild dehydration -Improved, Likely secondary to sepsis  History of recreational drug abuse -Urine Drug screen: Positive for benzos, opiates, marijuana -discussed cessation  Chronic pain -Patient states she has chronic lower back pain takes pain medicines regularly -Continue pain control -PT/OT consulted and recommended home health, however patients wants outpatient physical therapy. Refused rolling walker  Obesity -Nutrition consulted -BMI 32.82 -Patient unable to speak to her PCP regarding last saw modifications  Consultants None  Procedures  None  Discharge Exam: Vitals:   02/21/17 0511 02/21/17 0933  BP: (!) 153/77 (!) 148/75  Pulse: 88 89  Resp: 16 15  Temp: 98.5 F (36.9 C) 98 F (36.7 C)  SpO2: 97% 97%   Patient still feels weak but improved. Denies chest pain, abdominal pain, nausea, vomiting, diarrhea, constipation. Complains of continued back and knee pain.    General: Well developed, well nourished, NAD, appears stated age  HEENT: NCAT, mucous membranes  moist.  Cardiovascular: S1 S2 auscultated, RRR, 2/6 SEM  Respiratory: Diminished breath sounds  Abdomen: Soft, nontender, nondistended, + bowel sounds  Extremities: warm dry without cyanosis clubbing or edema  Neuro: AAOx3,  nonfocal  Psych: Appropriate mood and affect, pleasant  Discharge Instructions Discharge Instructions    Ambulatory referral to Occupational Therapy    Complete by:  As directed    Ambulatory referral to Physical Therapy    Complete by:  As directed    Discharge instructions    Complete by:  As directed    Patient will be discharged to home.  Patient will need to follow up with primary care provider within one week of discharge, repeat BMP and magnesium; repeat CXR in 3-4 weeks.  Patient should continue medications as prescribed.  Patient should follow a heart healthy diet.     Current Discharge Medication List    START taking these medications   Details  feeding supplement (BOOST / RESOURCE BREEZE) LIQD Take 1 Container by mouth 3 (three) times daily between meals. Refills: 0    levofloxacin (LEVAQUIN) 750 MG tablet Take 1 tablet (750 mg total) by mouth daily. Qty: 4 tablet, Refills: 0      CONTINUE these medications which have NOT CHANGED   Details  albuterol (PROVENTIL HFA;VENTOLIN HFA) 108 (90 Base) MCG/ACT inhaler Inhale into the lungs every 6 (six) hours as needed for wheezing or shortness of breath.    amLODipine (NORVASC) 5 MG tablet Take 1 tablet (5 mg total) by mouth daily. Qty: 30 tablet, Refills: 3    colchicine 0.6 MG tablet Take 0.6 mg by mouth daily as needed (gout).     docusate sodium (COLACE) 100 MG capsule Take 100 mg by mouth daily as needed for mild constipation.     gabapentin (NEURONTIN) 100 MG capsule Take 200 mg by mouth at bedtime. Refills: 0    HYDROcodone-acetaminophen (NORCO/VICODIN) 5-325 MG tablet Take 1-2 tablets by mouth every 4 (four) hours as needed. Qty: 12 tablet, Refills: 0    Multiple Vitamin (MULTIVITAMIN  WITH MINERALS) TABS tablet Take 1 tablet by mouth daily.    ondansetron (ZOFRAN ODT) 4 MG disintegrating tablet Take 1 tablet (4 mg total) by mouth every 8 (eight) hours as needed for nausea or vomiting. Qty: 20 tablet, Refills: 0    orphenadrine (NORFLEX) 100 MG tablet Take 1 tablet (100 mg total) by mouth 2 (two) times daily as needed for muscle spasms. Qty: 20 tablet, Refills: 0    valsartan-hydrochlorothiazide (DIOVAN-HCT) 320-12.5 MG tablet Take 1 tablet by mouth daily.    DULoxetine (CYMBALTA) 30 MG capsule Take 1 capsule (30 mg total) by mouth daily. Qty: 30 capsule, Refills: 1   Associated Diagnoses: Adjustment disorder with depressed mood       Allergies  Allergen Reactions  . Aspirin     DUE TO PATIENT HAVING HEPATISIS  . Penicillins Hives    Has patient had a PCN reaction causing immediate rash, facial/tongue/throat swelling, SOB or lightheadedness with hypotension: Yes Has patient had a PCN reaction causing severe rash involving mucus membranes or skin necrosis: No Has patient had a PCN reaction that required hospitalization No Has patient had a PCN reaction occurring within the last 10 years: No If all of the above answers are "NO", then may proceed with Cephalosporin use.    Follow-up Information    [facility].   Specialty:  Rehabilitation Why:  They will contact you for the first appointment. Contact information: [address] [place]       Audley Hose, MD. Schedule an appointment as soon as possible for a visit in 1 week(s).  Specialty:  Internal Medicine Why:  Hospital follow up Contact information: [place] [address] [phone]            The results of significant diagnostics from this hospitalization (including imaging, microbiology, ancillary and laboratory) are listed below for  reference.    Significant Diagnostic Studies: Ct Abdomen Pelvis W Contrast  Result Date: 02/18/2017 CLINICAL DATA:  Abdominal pain and weakness. EXAM: CT ABDOMEN AND PELVIS WITH CONTRAST TECHNIQUE: Multidetector CT imaging of the abdomen and pelvis was performed using the standard protocol following bolus administration of intravenous contrast. CONTRAST:  175mL ISOVUE-300 IOPAMIDOL (ISOVUE-300) INJECTION 61% COMPARISON:  CT abdomen pelvis dated January 24, 2016. FINDINGS: Lower chest: Patchy opacities in the right lower lobe. Hepatobiliary: Nodular hepatic contour. No focal liver lesion. The gallbladder is unremarkable. No biliary dilatation. Pancreas: Unremarkable. No pancreatic ductal dilatation or surrounding inflammatory changes. Spleen: Normal in size without focal abnormality. Adrenals/Urinary Tract: The adrenal glands are unremarkable. Subcentimeter low-density lesion in the upper pole of left kidney remains too small to characterize, but is unchanged. No renal calculi or hydronephrosis. Bladder is unremarkable. Stomach/Bowel: Stomach is within normal limits. Appendix appears normal. No evidence of bowel wall thickening, distention, or inflammatory changes. Vascular/Lymphatic: Mild aortic atherosclerosis. No enlarged abdominal or pelvic lymph nodes. Reproductive: Status post hysterectomy. No adnexal masses. Other: No free fluid or free air. Musculoskeletal: No acute or significant osseous findings. 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 IMPRESSION: 1. No acute intra-abdominal process. 2. Nodular liver contour, suggestive of cirrhosis. 3. Patchy opacities in the right lower lobe, consistent with aspiration and/or pneumonia. Electronically Signed   By: Titus Dubin M.D.   On: 02/18/2017 16:22   Dg Chest Port 1 View  Result Date: 02/18/2017 CLINICAL DATA:  Dyspnea, fever EXAM: PORTABLE CHEST 1 VIEW COMPARISON:  05/20/2016 chest radiograph. FINDINGS: Partially visualized surgical  hardware in the left proximal humerus and overlying the left glenoid, unchanged. Stable cardiomediastinal silhouette with normal heart size. No pneumothorax. No pleural effusion. No pulmonary edema. No acute consolidative airspace disease. Stable calcified subcentimeter right mid lung granuloma. IMPRESSION: No active cardiopulmonary disease. Electronically Signed   By: Ilona Sorrel M.D.   On: 02/18/2017 14:26    Microbiology: Recent Results (from the past 240 hour(s))  Culture, blood (single)     Status: None (Preliminary result)   Collection Time: 02/18/17 12:09 PM  Result Value Ref Range Status   Specimen Description BLOOD RIGHT HAND  Final   Special Requests IN PEDIATRIC BOTTLE Blood Culture adequate volume  Final   Culture NO GROWTH 2 DAYS  Final   Report Status PENDING  Incomplete  Culture, blood (routine x 2) Call MD if unable to obtain prior to antibiotics being given     Status: None (Preliminary result)   Collection Time: 02/18/17  9:38 PM  Result Value Ref Range Status   Specimen Description BLOOD RIGHT FOREARM  Final   Special Requests   Final    BOTTLES DRAWN AEROBIC ONLY Blood Culture adequate volume   Culture NO GROWTH 1 DAY  Final   Report Status PENDING  Incomplete     Labs: Basic Metabolic Panel:  Recent Labs Lab 02/18/17 1230 02/19/17 0019 02/20/17 0343 02/21/17 0300  NA 137 136 135 135  K 3.8 3.3* 3.5 3.6  CL 101 101 103 102  CO2 25 29 24 25   GLUCOSE 134* 105* 105* 115*  BUN 12 11 5* 6  CREATININE 0.67 0.61 0.48 0.53  CALCIUM 9.9 9.5 9.3 9.5  MG  --  1.6* 1.6*  --   PHOS  --  2.3* 2.6  --    Liver Function Tests:  Recent Labs Lab 02/18/17 1230 02/19/17 0019 02/20/17 0343 02/21/17 0300  AST 37 25 20 23   ALT 27 22 18 20   ALKPHOS 84 82 74 77  BILITOT 0.9 0.7 0.7 0.6  PROT 7.8 7.0 6.7 6.8  ALBUMIN 3.9 3.3* 3.2* 3.3*   No results for input(s): LIPASE, AMYLASE in the last 168 hours.  Recent Labs Lab 02/18/17 1302 02/19/17 0026 02/20/17 0343  02/21/17 0300  AMMONIA 50* 32 36* 28   CBC:  Recent Labs Lab 02/18/17 1230 02/19/17 0019 02/20/17 0343 02/21/17 0300  WBC 13.3* 14.9* 9.0 6.8  NEUTROABS  --  11.9* 6.3 4.6  HGB 11.7* 10.5* 10.0* 10.2*  HCT 36.1 31.8* 30.9* 31.5*  MCV 86.6 86.2 85.1 84.7  PLT 148* 138* 134* 150   Cardiac Enzymes: No results for input(s): CKTOTAL, CKMB, CKMBINDEX, TROPONINI in the last 168 hours. BNP: BNP (last 3 results) No results for input(s): BNP in the last 8760 hours.  ProBNP (last 3 results) No results for input(s): PROBNP in the last 8760 hours.  CBG: No results for input(s): GLUCAP in the last 168 hours.     SignedCristal Ford  [facility] 02/21/2017, 10:39 AM

## 2017-02-23 LAB — CULTURE, BLOOD (SINGLE)
Culture: NO GROWTH
Special Requests: ADEQUATE

## 2017-02-24 LAB — CULTURE, RESPIRATORY W GRAM STAIN

## 2017-02-24 LAB — CULTURE, RESPIRATORY: CULTURE: NORMAL

## 2017-02-24 LAB — CULTURE, BLOOD (ROUTINE X 2)
CULTURE: NO GROWTH
Special Requests: ADEQUATE

## 2017-03-02 DIAGNOSIS — E7220 Disorder of urea cycle metabolism, unspecified: Secondary | ICD-10-CM | POA: Insufficient documentation

## 2017-03-02 DIAGNOSIS — M109 Gout, unspecified: Secondary | ICD-10-CM | POA: Insufficient documentation

## 2017-03-02 HISTORY — DX: Hypomagnesemia: E83.42

## 2017-03-29 DIAGNOSIS — J42 Unspecified chronic bronchitis: Secondary | ICD-10-CM | POA: Insufficient documentation

## 2017-04-13 DIAGNOSIS — M25512 Pain in left shoulder: Secondary | ICD-10-CM | POA: Insufficient documentation

## 2017-04-16 ENCOUNTER — Other Ambulatory Visit: Payer: Self-pay | Admitting: General Surgery

## 2017-04-17 ENCOUNTER — Other Ambulatory Visit: Payer: Self-pay | Admitting: Internal Medicine

## 2017-04-17 DIAGNOSIS — E2839 Other primary ovarian failure: Secondary | ICD-10-CM

## 2017-04-18 ENCOUNTER — Ambulatory Visit
Admission: RE | Admit: 2017-04-18 | Discharge: 2017-04-18 | Disposition: A | Discharge status: Home / Self Care | Payer: Medicare HMO | Source: Ambulatory Visit | Attending: Internal Medicine | Admitting: Internal Medicine

## 2017-04-18 DIAGNOSIS — E2839 Other primary ovarian failure: Secondary | ICD-10-CM

## 2017-07-09 ENCOUNTER — Other Ambulatory Visit: Payer: Self-pay | Admitting: Nurse Practitioner

## 2017-07-09 DIAGNOSIS — K7469 Other cirrhosis of liver: Secondary | ICD-10-CM

## 2017-07-17 ENCOUNTER — Ambulatory Visit
Admission: RE | Admit: 2017-07-17 | Discharge: 2017-07-17 | Disposition: A | Discharge status: Home / Self Care | Payer: Medicare HMO | Source: Ambulatory Visit | Attending: Nurse Practitioner | Admitting: Nurse Practitioner

## 2017-07-17 DIAGNOSIS — K7469 Other cirrhosis of liver: Secondary | ICD-10-CM

## 2017-07-18 ENCOUNTER — Ambulatory Visit (INDEPENDENT_AMBULATORY_CARE_PROVIDER_SITE_OTHER): Payer: Medicare HMO

## 2017-07-18 ENCOUNTER — Ambulatory Visit (INDEPENDENT_AMBULATORY_CARE_PROVIDER_SITE_OTHER): Payer: Medicare HMO | Admitting: Orthopaedic Surgery

## 2017-07-18 ENCOUNTER — Encounter (INDEPENDENT_AMBULATORY_CARE_PROVIDER_SITE_OTHER): Payer: Self-pay | Admitting: Orthopaedic Surgery

## 2017-07-18 VITALS — BP 152/76 | HR 76 | Resp 16 | Ht 61.0 in | Wt 191.0 lb

## 2017-07-18 DIAGNOSIS — M25562 Pain in left knee: Secondary | ICD-10-CM

## 2017-07-18 DIAGNOSIS — G8929 Other chronic pain: Secondary | ICD-10-CM

## 2017-07-18 MED ORDER — METHYLPREDNISOLONE ACETATE 40 MG/ML IJ SUSP
80.0000 mg | INTRAMUSCULAR | Status: AC | PRN
  Administered 2017-07-18: 80 mg

## 2017-07-18 MED ORDER — LIDOCAINE HCL 1 % IJ SOLN
2.0000 mL | INTRAMUSCULAR | Status: AC | PRN
  Administered 2017-07-18: 2 mL

## 2017-07-18 MED ORDER — BUPIVACAINE HCL 0.5 % IJ SOLN
2.0000 mL | INTRAMUSCULAR | Status: AC | PRN
  Administered 2017-07-18: 2 mL via INTRA_ARTICULAR

## 2017-07-18 NOTE — Progress Notes (Signed)
Office Visit Note   Patient: Ashley Riddle           Date of Birth: Nov 11, 1945           MRN: [id number] Visit Date: 07/18/2017              Requested by: Audley Hose, MD [place], [place] [address] PCP: Audley Hose, MD   Assessment & Plan: Visit Diagnoses:  1. Chronic pain of left knee     Plan: End-stage osteoarthritis left knee. Long discussion with Mrs. Grandville Silos. We'll proceed with cortisone injection and monitor her response. She is aware she may be a little bit more uncomfortable for a day or so. Scars knee replacement as a definitive procedure  Follow-Up Instructions: Return if symptoms worsen or fail to improve.   Orders:  Orders Placed This Encounter  Procedures  . Large Joint Inj: L knee  . XR KNEE 3 VIEW LEFT   No orders of the defined types were placed in this encounter.     Procedures: Large Joint Inj: L knee on 07/18/2017 11:54 AM Indications: pain and diagnostic evaluation Details: 25 G 1.5 in needle, anteromedial approach  Arthrogram: No  Medications: 2 mL lidocaine 1 %; 2 mL bupivacaine 0.5 %; 80 mg methylPREDNISolone acetate 40 MG/ML Procedure, treatment alternatives, risks and benefits explained, specific risks discussed. Consent was given by the patient. Patient was prepped and draped in the usual sterile fashion.       Clinical Data: No additional findings.   Subjective: No chief complaint on file. Has been experiencing left knee pain for "a while". She noted insidious onset without injury or trauma. She presently is having pain "all over the knee" to the point of compromise. She walks with a limp but refuses to use a cane. No back pain. No thigh pain. No numbness or tingling. Has had some swelling in the knee. Has had some difficulty sleeping.  HPI  Review of Systems  Constitutional: Negative for fatigue and fever.  HENT: Negative for ear pain, sinus pressure and sinus pain.   Eyes: Negative  for pain.  Respiratory: Positive for cough.   Cardiovascular: Positive for leg swelling.  Gastrointestinal: Negative for blood in stool, constipation and diarrhea.  Endocrine: Negative for cold intolerance and heat intolerance.  Genitourinary: Negative for difficulty urinating.  Musculoskeletal: Positive for joint swelling and neck pain.  Skin: Negative for rash and wound.  Allergic/Immunologic: Negative for food allergies.  Neurological: Positive for weakness and numbness. Negative for dizziness, light-headedness and headaches.  Hematological: Bruises/bleeds easily.  Psychiatric/Behavioral: Positive for sleep disturbance.     Objective: Vital Signs: BP (!) 152/76 (BP Location: Right Arm, Patient Position: Sitting, Cuff Size: Normal)   Pulse 76   Resp 16   Ht 5\' 1"  (1.549 m)   Wt 191 lb (86.6 kg)   BMI 36.09 kg/m   Physical Exam  Ortho Exam awake alert and oriented 3. Obviously uncomfortable. Has an unusual gait with pain referable to her left knee. An area along the medial lateral joint. No pain beneath the patella. No effusion. Full extension but only about 95 of flexion. No instability. No popliteal pain or calf discomfort. Neurovascular exam intact. Palpable osteophytes along the medial lateral compartment  Specialty Comments:  No specialty comments available.  Imaging: Xr Knee 3 View Left  Result Date: 07/18/2017 Films of the left knee were obtained in 3 projections standing. There are advanced osteoarthritic changes in all 3  compartments. Peripheral osteophytes are identified in all 3 compartments with subchondral sclerosis mostly in the medial compartment at the tibia and femur. No ectopic calcification. Some prominence of the lateral patella facet with mild tilting. Large osteophytes identified. Findings are consistent with end-stage osteoarthritis    PMFS History: Patient Active Problem List   Diagnosis Date Noted  . CAP (community acquired pneumonia) 02/18/2017    . Liver cirrhosis ([place]) 02/18/2017  . Leukocytosis 02/18/2017  . Fever 02/18/2017  . Sinus tachycardia 02/18/2017  . Tachypnea 02/18/2017  . RLL pneumonia ([place]) 02/18/2017  . Increased ammonia level 02/18/2017  . Retained metal fragment left scapula 12/26/2015  . Fracture of glenoid process of left scapula 12/29/2014  . Status post total shoulder arthroplasty 12/27/2014  . Cocaine dependence in remission ([place]) 09/29/2014  . Severe heroin dependence in sustained remission ([place]) 09/29/2014  . Opiate abuse, episodic ([place]) 09/29/2014  . Mild tetrahydrocannabinol (THC) abuse 09/29/2014  . Adjustment disorder with depressed mood 09/29/2014  . Chronic pain syndrome 04/18/2014  . Primary osteoarthritis of left shoulder 04/18/2014  . Primary osteoarthritis of right knee 04/18/2014  . Primary osteoarthritis of left knee 04/18/2014  . ANXIETY 03/22/2010  . HYPERTENSION, BENIGN ESSENTIAL 03/22/2010  . ASTHMA 03/22/2010  . CONSTIPATION 03/22/2010  . HEPATITIS C 06/04/1995   Past Medical History:  Diagnosis Date  . Anxiety   . Arthritis   . Constipation   . DDD (degenerative disc disease), cervical   . DDD (degenerative disc disease), lumbar   . Gout   . Heart murmur    probable bicuspid AV with mild AS, mild MR by 04/22/14 Echo (Dr. Montez Morita)  . Hepatitis C    treated 2016   . History of blood transfusion   . HTN (hypertension)   . Hypertension   . Pneumonia 12/02/13    Family History  Problem Relation Age of Onset  . Heart disease Mother   . Kidney disease Mother   . Alcohol abuse Mother     Past Surgical History:  Procedure Laterality Date  . ABDOMINAL HYSTERECTOMY    . APPENDECTOMY    . BACK SURGERY    . CESAREAN SECTION     x 3  . COLONOSCOPY W/ POLYPECTOMY    . HARDWARE REMOVAL Left 12/26/2015   Procedure: REMOVAL K-WIRE LEFT SCAPULA;  Surgeon: Garald Balding, MD;  Location: [facility];  Service: Orthopedics;  Laterality: Left;  . INNER EAR SURGERY     blood vessel  .  TONSILLECTOMY    . TOTAL SHOULDER ARTHROPLASTY Left 12/27/2014   Procedure: TOTAL SHOULDER ARTHROPLASTY;  Surgeon: Garald Balding, MD;  Location: [facility];  Service: Orthopedics;  Laterality: Left;   Social History   Occupational History  . Not on file  Tobacco Use  . Smoking status: Former Smoker    Years: 49.00  . Smokeless tobacco: Never Used  . Tobacco comment: quit 2007  Substance and Sexual Activity  . Alcohol use: No  . Drug use: No    Comment: 1 joint per week. last used heroin and cocaine in 1992.  Smokes Marijuana once a week., 12/22/15- "2 weeks ago"  . Sexual activity: Not on file

## 2017-08-14 ENCOUNTER — Other Ambulatory Visit: Payer: Self-pay | Admitting: Internal Medicine

## 2017-08-14 DIAGNOSIS — Z139 Encounter for screening, unspecified: Secondary | ICD-10-CM

## 2017-09-02 ENCOUNTER — Ambulatory Visit: Payer: Self-pay

## 2017-09-15 ENCOUNTER — Encounter (HOSPITAL_COMMUNITY): Payer: Self-pay | Admitting: Emergency Medicine

## 2017-09-15 ENCOUNTER — Other Ambulatory Visit: Payer: Self-pay

## 2017-09-15 ENCOUNTER — Emergency Department (HOSPITAL_COMMUNITY): Payer: Medicare HMO

## 2017-09-15 ENCOUNTER — Emergency Department (HOSPITAL_COMMUNITY)
Admission: EM | Admit: 2017-09-15 | Discharge: 2017-09-15 | Discharge status: Against Medical Advice | Payer: Medicare HMO

## 2017-09-15 ENCOUNTER — Emergency Department (HOSPITAL_COMMUNITY)
Admission: EM | Admit: 2017-09-15 | Discharge: 2017-09-15 | Disposition: A | Discharge status: Home / Self Care | Payer: Medicare HMO | Attending: Emergency Medicine | Admitting: Emergency Medicine

## 2017-09-15 DIAGNOSIS — R0602 Shortness of breath: Secondary | ICD-10-CM | POA: Insufficient documentation

## 2017-09-15 DIAGNOSIS — Z5321 Procedure and treatment not carried out due to patient leaving prior to being seen by health care provider: Secondary | ICD-10-CM | POA: Insufficient documentation

## 2017-09-15 LAB — CBC
HEMATOCRIT: 32.9 % — AB (ref 36.0–46.0)
Hemoglobin: 10.7 g/dL — ABNORMAL LOW (ref 12.0–15.0)
MCH: 28.5 pg (ref 26.0–34.0)
MCHC: 32.5 g/dL (ref 30.0–36.0)
MCV: 87.7 fL (ref 78.0–100.0)
Platelets: 199 10*3/uL (ref 150–400)
RBC: 3.75 MIL/uL — AB (ref 3.87–5.11)
RDW: 12.7 % (ref 11.5–15.5)
WBC: 12.3 10*3/uL — AB (ref 4.0–10.5)

## 2017-09-15 LAB — BASIC METABOLIC PANEL
Anion gap: 11 (ref 5–15)
BUN: 12 mg/dL (ref 6–20)
CHLORIDE: 102 mmol/L (ref 101–111)
CO2: 25 mmol/L (ref 22–32)
CREATININE: 0.77 mg/dL (ref 0.44–1.00)
Calcium: 9.9 mg/dL (ref 8.9–10.3)
GFR calc Af Amer: >60 mL/min (ref >60)
GFR calc non Af Amer: >60 mL/min (ref >60)
Glucose, Bld: 151 mg/dL — ABNORMAL HIGH (ref 65–99)
POTASSIUM: 3.9 mmol/L (ref 3.5–5.1)
SODIUM: 138 mmol/L (ref 135–145)

## 2017-09-15 LAB — I-STAT TROPONIN, ED: Troponin i, poc: 0 ng/mL (ref 0.00–0.08)

## 2017-09-16 ENCOUNTER — Other Ambulatory Visit: Payer: Self-pay | Admitting: Internal Medicine

## 2017-09-16 ENCOUNTER — Ambulatory Visit (INDEPENDENT_AMBULATORY_CARE_PROVIDER_SITE_OTHER): Payer: Medicare HMO | Admitting: Orthopaedic Surgery

## 2017-09-16 ENCOUNTER — Ambulatory Visit
Admission: RE | Admit: 2017-09-16 | Discharge: 2017-09-16 | Disposition: A | Discharge status: Home / Self Care | Payer: Medicare HMO | Source: Ambulatory Visit | Attending: Internal Medicine | Admitting: Internal Medicine

## 2017-09-16 DIAGNOSIS — M25561 Pain in right knee: Secondary | ICD-10-CM

## 2017-09-16 DIAGNOSIS — M25562 Pain in left knee: Principal | ICD-10-CM

## 2017-09-16 DIAGNOSIS — M79662 Pain in left lower leg: Principal | ICD-10-CM

## 2017-09-16 DIAGNOSIS — M79661 Pain in right lower leg: Secondary | ICD-10-CM

## 2017-09-17 ENCOUNTER — Ambulatory Visit (INDEPENDENT_AMBULATORY_CARE_PROVIDER_SITE_OTHER): Payer: Medicare HMO | Admitting: Orthopedic Surgery

## 2017-09-19 ENCOUNTER — Ambulatory Visit
Admission: RE | Admit: 2017-09-19 | Discharge: 2017-09-19 | Disposition: A | Discharge status: Home / Self Care | Payer: Medicare HMO | Source: Ambulatory Visit | Attending: Internal Medicine | Admitting: Internal Medicine

## 2017-09-19 DIAGNOSIS — M79662 Pain in left lower leg: Principal | ICD-10-CM

## 2017-09-19 DIAGNOSIS — M79661 Pain in right lower leg: Secondary | ICD-10-CM

## 2017-10-03 ENCOUNTER — Ambulatory Visit: Payer: Self-pay

## 2017-10-15 ENCOUNTER — Ambulatory Visit
Admission: RE | Admit: 2017-10-15 | Discharge: 2017-10-15 | Disposition: A | Discharge status: Home / Self Care | Payer: Medicare HMO | Source: Ambulatory Visit | Attending: Internal Medicine | Admitting: Internal Medicine

## 2017-10-15 DIAGNOSIS — Z139 Encounter for screening, unspecified: Secondary | ICD-10-CM

## 2017-11-17 ENCOUNTER — Encounter (INDEPENDENT_AMBULATORY_CARE_PROVIDER_SITE_OTHER): Payer: Self-pay | Admitting: Orthopaedic Surgery

## 2017-11-17 ENCOUNTER — Ambulatory Visit (INDEPENDENT_AMBULATORY_CARE_PROVIDER_SITE_OTHER): Payer: Medicare HMO | Admitting: Orthopaedic Surgery

## 2017-11-17 VITALS — BP 128/72 | HR 76 | Resp 16 | Ht 61.0 in | Wt 193.0 lb

## 2017-11-17 DIAGNOSIS — M25562 Pain in left knee: Secondary | ICD-10-CM

## 2017-11-17 DIAGNOSIS — M25561 Pain in right knee: Secondary | ICD-10-CM | POA: Diagnosis not present

## 2017-11-17 DIAGNOSIS — G8929 Other chronic pain: Secondary | ICD-10-CM | POA: Diagnosis not present

## 2017-11-17 MED ORDER — LIDOCAINE HCL 1 % IJ SOLN
2.0000 mL | INTRAMUSCULAR | Status: AC | PRN
  Administered 2017-11-17: 2 mL

## 2017-11-17 MED ORDER — BUPIVACAINE HCL 0.5 % IJ SOLN
2.0000 mL | INTRAMUSCULAR | Status: AC | PRN
  Administered 2017-11-17: 2 mL via INTRA_ARTICULAR

## 2017-11-17 MED ORDER — METHYLPREDNISOLONE ACETATE 40 MG/ML IJ SUSP
80.0000 mg | INTRAMUSCULAR | Status: AC | PRN
  Administered 2017-11-17: 80 mg

## 2017-11-17 NOTE — Progress Notes (Signed)
Office Visit Note   Patient: Ashley Riddle           Date of Birth: 02/24/1946           MRN: [id number] Visit Date: 11/17/2017              Requested by: Audley Hose, MD [place], [place] [address] PCP: Audley Hose, MD   Assessment & Plan: Visit Diagnoses:  1. Chronic pain of both knees     Plan: Bilateral knee osteoarthritis.  Recent exacerbation of pain.  Will inject both knees  Follow-Up Instructions: Return if symptoms worsen or fail to improve.   Orders:  Orders Placed This Encounter  Procedures  . Large Joint Inj: L knee  . Large Joint Inj: R knee   Meds ordered this encounter  Medications  . bupivacaine (MARCAINE) 0.5 % (with pres) injection 2 mL  . bupivacaine (MARCAINE) 0.5 % (with pres) injection 2 mL  . lidocaine (XYLOCAINE) 1 % (with pres) injection 2 mL  . lidocaine (XYLOCAINE) 1 % (with pres) injection 2 mL  . methylPREDNISolone acetate (DEPO-MEDROL) injection 80 mg  . methylPREDNISolone acetate (DEPO-MEDROL) injection 80 mg      Procedures: No procedures performed   Clinical Data: No additional findings.   Subjective: Chief Complaint  Patient presents with  . Left Knee - Pain  . Right Knee - Pain  . Knee Pain    Bil knee pain constant, no injury, no surgery to knees, not diabetic, hydrocodone helps some, wants inj., swelling, difficulty walking  Prior diagnosis of osteoarthritis both knees.  Several months status post injections with good relief.  Had recent exacerbation without injury or trauma.  Presently taking hydrocodone and gabapentin on a chronic basis through pain clinic  HPI  Review of Systems   Objective: Vital Signs: BP 128/72 (BP Location: Right Arm, Patient Position: Sitting, Cuff Size: Normal)   Pulse 76   Resp 16   Ht 5\' 1"  (1.549 m)   Wt 193 lb (87.5 kg)   BMI 36.47 kg/m   Physical Exam  Constitutional: She is oriented to person, place, and time. She appears  well-developed and well-nourished.  HENT:  Mouth/Throat: Oropharynx is clear and moist.  Eyes: Pupils are equal, round, and reactive to light. EOM are normal.  Pulmonary/Chest: Effort normal.  Neurological: She is alert and oriented to person, place, and time.  Skin: Skin is warm and dry.  Psychiatric: She has a normal mood and affect. Her behavior is normal.    Ortho Exam awake alert and oriented x3.  Comfortable sitting.  Cried throughout office exam related to her knee pain pain predominant along the medial compartment of both knees.  No significant effusion.  Full extension.  Walks with a very deliberate and slow-paced gait.  No distal edema.  Hip pain.  Straight leg raise negative.  Specialty Comments:  No specialty comments available.  Imaging: No results found.   PMFS History: Patient Active Problem List   Diagnosis Date Noted  . CAP (community acquired pneumonia) 02/18/2017  . Liver cirrhosis ([facility]) 02/18/2017  . Leukocytosis 02/18/2017  . Fever 02/18/2017  . Sinus tachycardia 02/18/2017  . Tachypnea 02/18/2017  . RLL pneumonia ([facility]) 02/18/2017  . Increased ammonia level 02/18/2017  . Retained metal fragment left scapula 12/26/2015  . Fracture of glenoid process of left scapula 12/29/2014  . Status post total shoulder arthroplasty 12/27/2014  . Cocaine dependence in remission ([place]) 09/29/2014  .  Severe heroin dependence in sustained remission ([facility]) 09/29/2014  . Opiate abuse, episodic ([place] [facility]) 09/29/2014  . Mild tetrahydrocannabinol (THC) abuse 09/29/2014  . Adjustment disorder with depressed mood 09/29/2014  . Chronic pain syndrome 04/18/2014  . Primary osteoarthritis of left shoulder 04/18/2014  . Primary osteoarthritis of right knee 04/18/2014  . Primary osteoarthritis of left knee 04/18/2014  . ANXIETY 03/22/2010  . HYPERTENSION, BENIGN ESSENTIAL 03/22/2010  . ASTHMA 03/22/2010  . CONSTIPATION 03/22/2010  . HEPATITIS C 06/04/1995   Past Medical History:    Diagnosis Date  . Anxiety   . Arthritis   . Constipation   . DDD (degenerative disc disease), cervical   . DDD (degenerative disc disease), lumbar   . Gout   . Heart murmur    probable bicuspid AV with mild AS, mild MR by 04/22/14 Echo (Dr. Montez Morita)  . Hepatitis C    treated 2016   . History of blood transfusion   . HTN (hypertension)   . Hypertension   . Pneumonia 12/02/13    Family History  Problem Relation Age of Onset  . Heart disease Mother   . Kidney disease Mother   . Alcohol abuse Mother   . Breast cancer Cousin 25    Past Surgical History:  Procedure Laterality Date  . ABDOMINAL HYSTERECTOMY    . APPENDECTOMY    . BACK SURGERY    . CESAREAN SECTION     x 3  . COLONOSCOPY W/ POLYPECTOMY    . HARDWARE REMOVAL Left 12/26/2015   Procedure: REMOVAL K-WIRE LEFT SCAPULA;  Surgeon: Garald Balding, MD;  Location: [facility];  Service: Orthopedics;  Laterality: Left;  . INNER EAR SURGERY     blood vessel  . TONSILLECTOMY    . TOTAL SHOULDER ARTHROPLASTY Left 12/27/2014   Procedure: TOTAL SHOULDER ARTHROPLASTY;  Surgeon: Garald Balding, MD;  Location: [facility];  Service: Orthopedics;  Laterality: Left;   Social History   Occupational History  . Not on file  Tobacco Use  . Smoking status: Former Smoker    Packs/day: 1.00    Years: 49.00    Pack years: 49.00    Types: Cigarettes    Last attempt to quit: 2007    Years since quitting: 12.4  . Smokeless tobacco: Never Used  . Tobacco comment: quit 2007  Substance and Sexual Activity  . Alcohol use: No  . Drug use: No    Types: Marijuana    Comment: 1 joint per week. last used heroin and cocaine in 1992.  Smokes Marijuana once a week., 12/22/15- "2 weeks ago"  . Sexual activity: Not on file     Garald Balding, MD   Note - This record has been created using [employer].  Chart creation errors have been sought, but may not always  have been located. Such creation errors do not reflect on  the standard of  medical care.

## 2017-11-17 NOTE — Progress Notes (Deleted)
Office Visit Note   Patient: Ashley Riddle           Date of Birth: 09/17/1945           MRN: [id number] Visit Date: 11/17/2017              Requested by: Audley Hose, MD [address] [place], [place] [address] PCP: Audley Hose, MD   Assessment & Plan: Visit Diagnoses: No diagnosis found.  Plan: ***  Follow-Up Instructions: No follow-ups on file.   Orders:  No orders of the defined types were placed in this encounter.  No orders of the defined types were placed in this encounter.     Procedures: No procedures performed   Clinical Data: No additional findings.   Subjective: Chief Complaint  Patient presents with  . Left Knee - Pain  . Right Knee - Pain  . Knee Pain    Bil knee pain constant, no injury, no surgery to knees, not diabetic, hydrocodone helps some, wants inj.    HPI  Review of Systems  Constitutional: Positive for activity change and fatigue.  HENT: Negative for trouble swallowing.   Eyes: Negative for pain.  Respiratory: Positive for wheezing.   Cardiovascular: Positive for leg swelling.  Gastrointestinal: Negative for constipation.  Endocrine: Negative for cold intolerance.  Genitourinary: Negative for difficulty urinating.  Musculoskeletal: Positive for gait problem and joint swelling.  Skin: Negative for rash.  Neurological: Positive for weakness.  Hematological: Does not bruise/bleed easily.  Psychiatric/Behavioral: Positive for sleep disturbance.     Objective: Vital Signs: BP 128/72 (BP Location: Right Arm, Patient Position: Sitting, Cuff Size: Normal)   Pulse 76   Resp 16   Ht 5\' 1"  (1.549 m)   Wt 193 lb (87.5 kg)   BMI 36.47 kg/m   Physical Exam  Ortho Exam  Specialty Comments:  No specialty comments available.  Imaging: No results found.   PMFS History: Patient Active Problem List   Diagnosis Date Noted  . CAP (community acquired pneumonia) 02/18/2017  . Liver cirrhosis ([facility])  02/18/2017  . Leukocytosis 02/18/2017  . Fever 02/18/2017  . Sinus tachycardia 02/18/2017  . Tachypnea 02/18/2017  . RLL pneumonia ([facility]) 02/18/2017  . Increased ammonia level 02/18/2017  . Retained metal fragment left scapula 12/26/2015  . Fracture of glenoid process of left scapula 12/29/2014  . Status post total shoulder arthroplasty 12/27/2014  . Cocaine dependence in remission ([facility]) 09/29/2014  . Severe heroin dependence in sustained remission ([place]) 09/29/2014  . Opiate abuse, episodic ([facility]) 09/29/2014  . Mild tetrahydrocannabinol (THC) abuse 09/29/2014  . Adjustment disorder with depressed mood 09/29/2014  . Chronic pain syndrome 04/18/2014  . Primary osteoarthritis of left shoulder 04/18/2014  . Primary osteoarthritis of right knee 04/18/2014  . Primary osteoarthritis of left knee 04/18/2014  . ANXIETY 03/22/2010  . HYPERTENSION, BENIGN ESSENTIAL 03/22/2010  . ASTHMA 03/22/2010  . CONSTIPATION 03/22/2010  . HEPATITIS C 06/04/1995   Past Medical History:  Diagnosis Date  . Anxiety   . Arthritis   . Constipation   . DDD (degenerative disc disease), cervical   . DDD (degenerative disc disease), lumbar   . Gout   . Heart murmur    probable bicuspid AV with mild AS, mild MR by 04/22/14 Echo (Dr. Montez Morita)  . Hepatitis C    treated 2016   . History of blood transfusion   . HTN (hypertension)   . Hypertension   . Pneumonia 12/02/13  Family History  Problem Relation Age of Onset  . Heart disease Mother   . Kidney disease Mother   . Alcohol abuse Mother   . Breast cancer Cousin 49    Past Surgical History:  Procedure Laterality Date  . ABDOMINAL HYSTERECTOMY    . APPENDECTOMY    . BACK SURGERY    . CESAREAN SECTION     x 3  . COLONOSCOPY W/ POLYPECTOMY    . HARDWARE REMOVAL Left 12/26/2015   Procedure: REMOVAL K-WIRE LEFT SCAPULA;  Surgeon: Garald Balding, MD;  Location: [facility];  Service: Orthopedics;  Laterality: Left;  . INNER EAR SURGERY     blood vessel    . TONSILLECTOMY    . TOTAL SHOULDER ARTHROPLASTY Left 12/27/2014   Procedure: TOTAL SHOULDER ARTHROPLASTY;  Surgeon: Garald Balding, MD;  Location: [facility];  Service: Orthopedics;  Laterality: Left;   Social History   Occupational History  . Not on file  Tobacco Use  . Smoking status: Former Smoker    Packs/day: 1.00    Years: 49.00    Pack years: 49.00    Types: Cigarettes    Last attempt to quit: 2007    Years since quitting: 12.4  . Smokeless tobacco: Never Used  . Tobacco comment: quit 2007  Substance and Sexual Activity  . Alcohol use: No  . Drug use: No    Types: Marijuana    Comment: 1 joint per week. last used heroin and cocaine in 1992.  Smokes Marijuana once a week., 12/22/15- "2 weeks ago"  . Sexual activity: Not on file

## 2017-11-17 NOTE — Progress Notes (Signed)
   Procedure Note  Patient: Ashley Riddle             Date of Birth: [birth year]           MRN: [id number]             Visit Date: 11/17/2017  Procedures: Visit Diagnoses: Chronic pain of both knees - Plan: Large Joint Inj: L knee, Large Joint Inj: R knee  Large Joint Inj: L knee on 11/17/2017 10:10 AM Indications: pain and diagnostic evaluation Details: 25 G 1.5 in needle, anteromedial approach  Arthrogram: No  Medications: 2 mL bupivacaine 0.5 %; 2 mL lidocaine 1 %; 80 mg methylPREDNISolone acetate 40 MG/ML Procedure, treatment alternatives, risks and benefits explained, specific risks discussed. Consent was given by the patient. Patient was prepped and draped in the usual sterile fashion.   Large Joint Inj: R knee on 11/17/2017 10:11 AM Indications: pain and diagnostic evaluation Details: 25 G 1.5 in needle, anteromedial approach  Arthrogram: No  Medications: 2 mL bupivacaine 0.5 %; 2 mL lidocaine 1 %; 80 mg methylPREDNISolone acetate 40 MG/ML Procedure, treatment alternatives, risks and benefits explained, specific risks discussed. Consent was given by the patient. Immediately prior to procedure a time out was called to verify the correct patient, procedure, equipment, support staff and site/side marked as required. Patient was prepped and draped in the usual sterile fashion.

## 2018-01-08 ENCOUNTER — Other Ambulatory Visit: Payer: Self-pay | Admitting: Nurse Practitioner

## 2018-01-08 DIAGNOSIS — K7469 Other cirrhosis of liver: Secondary | ICD-10-CM

## 2018-01-21 ENCOUNTER — Ambulatory Visit
Admission: RE | Admit: 2018-01-21 | Discharge: 2018-01-21 | Disposition: A | Discharge status: Home / Self Care | Payer: Medicare HMO | Source: Ambulatory Visit | Attending: Nurse Practitioner | Admitting: Nurse Practitioner

## 2018-01-21 DIAGNOSIS — K7469 Other cirrhosis of liver: Secondary | ICD-10-CM

## 2018-01-27 ENCOUNTER — Ambulatory Visit (INDEPENDENT_AMBULATORY_CARE_PROVIDER_SITE_OTHER): Payer: Medicare HMO | Admitting: Orthopaedic Surgery

## 2018-01-27 ENCOUNTER — Encounter (INDEPENDENT_AMBULATORY_CARE_PROVIDER_SITE_OTHER): Payer: Self-pay | Admitting: Orthopaedic Surgery

## 2018-01-27 VITALS — BP 144/95 | HR 91 | Ht 61.0 in | Wt 193.0 lb

## 2018-01-27 DIAGNOSIS — M1711 Unilateral primary osteoarthritis, right knee: Secondary | ICD-10-CM

## 2018-01-27 DIAGNOSIS — M1712 Unilateral primary osteoarthritis, left knee: Secondary | ICD-10-CM | POA: Diagnosis not present

## 2018-01-27 MED ORDER — METHYLPREDNISOLONE ACETATE 40 MG/ML IJ SUSP
80.0000 mg | INTRAMUSCULAR | Status: AC | PRN
  Administered 2018-01-27: 80 mg

## 2018-01-27 MED ORDER — BUPIVACAINE HCL 0.5 % IJ SOLN
2.0000 mL | INTRAMUSCULAR | Status: AC | PRN
  Administered 2018-01-27: 2 mL via INTRA_ARTICULAR

## 2018-01-27 MED ORDER — LIDOCAINE HCL 1 % IJ SOLN
2.0000 mL | INTRAMUSCULAR | Status: AC | PRN
  Administered 2018-01-27: 2 mL

## 2018-01-27 NOTE — Progress Notes (Signed)
Office Visit Note   Patient: Ashley Riddle           Date of Birth: 1946/02/09           MRN: [id number] Visit Date: 01/27/2018              Requested by: Audley Hose, MD [place], [place] [id number] PCP: Audley Hose, MD   Assessment & Plan: Visit Diagnoses:  1. Primary osteoarthritis of right knee   2. Primary osteoarthritis of left knee     Plan: Bilateral end-stage osteoarthritis of the knees.  Ashley Riddle is "miserable".  I injected both knees in June with cortisone and she had excellent result until about 3 to 4 weeks ago when she had recurrence of her pain.  A long talk today regarding the osteoarthritis.  She is thinking seriously of knee replacement.  She is more symptomatic on the left than the right.  She is also had Vicodin per her primary care physician in the past and I suggested that she contact him regarding any further medicine.  She does have history of hepatitis C and I would prefer primary care physician to will order her medicines.  She will let us know when she like to proceed with a knee replacement.  She needs to wait at least 2 months after this injection  Follow-Up Instructions: Return if symptoms worsen or fail to improve.   Orders:  Orders Placed This Encounter  Procedures  . Large Joint Inj: bilateral knee   No orders of the defined types were placed in this encounter.     Procedures: Large Joint Inj: bilateral knee on 01/27/2018 1:42 PM Indications: diagnostic evaluation Details: 25 G 1.5 in needle, anteromedial approach  Arthrogram: No  Medications (Right): 2 mL lidocaine 1 %; 2 mL bupivacaine 0.5 %; 80 mg methylPREDNISolone acetate 40 MG/ML Medications (Left): 2 mL lidocaine 1 %; 2 mL bupivacaine 0.5 %; 80 mg methylPREDNISolone acetate 40 MG/ML Outcome: tolerated well, no immediate complications Consent was given by the patient. Immediately prior to procedure a time out was called to verify the  correct patient, procedure, equipment, support staff and site/side marked as required. Patient was prepped and draped in the usual sterile fashion.       Clinical Data: No additional findings.   Subjective: Chief Complaint  Patient presents with  . New Patient (Initial Visit)    BI LAT KNEE PAIN STARTED OVER THE WEEKEND, NO INJURY. USING ICE LAST INJECTION WAS 6/19  Ashley Riddle is "miserable" with the osteoarthritis of both knees.  Comes in crying today with her daughter.  Cortisone injection from 2 months ago made a big difference only to have the pain recur over the past 3 to 4 weeks.  She has evidence of end-stage osteoarthritis of both knees worse on the left.  She has not had any injury or trauma.  No fever chills  HPI  Review of Systems  Constitutional: Positive for fatigue.  HENT: Negative for ear pain.   Eyes: Negative for pain.  Respiratory: Positive for cough.   Cardiovascular: Positive for leg swelling.  Gastrointestinal: Negative for constipation and diarrhea.  Genitourinary: Negative for difficulty urinating.  Musculoskeletal: Positive for back pain. Negative for neck pain.  Skin: Negative for rash.  Allergic/Immunologic: Negative for food allergies.  Neurological: Positive for weakness.  Hematological: Does not bruise/bleed easily.  Psychiatric/Behavioral: Positive for sleep disturbance.     Objective: Vital Signs: BP (!) 144/95 (  BP Location: Left Arm, Patient Position: Sitting, Cuff Size: Normal)   Pulse 91   Ht 5\' 1"  (1.549 m)   Wt 193 lb (87.5 kg)   BMI 36.47 kg/m   Physical Exam  Constitutional: She is oriented to person, place, and time. She appears well-developed and well-nourished.  HENT:  Mouth/Throat: Oropharynx is clear and moist.  Eyes: Pupils are equal, round, and reactive to light. EOM are normal.  Pulmonary/Chest: Effort normal.  Neurological: She is alert and oriented to person, place, and time.  Skin: Skin is warm and dry.    Psychiatric: She has a normal mood and affect. Her behavior is normal.    Ortho Exam crying throughout the exam.  Very minimal effusion of both knees.  Predominately medial joint pain greater on the left than the right knee.  Lacks a few degrees to full extension bilaterally flexed up about 100 to 103 degrees.  No instability.  No popliteal pain.  Knees were not hot or warm.  No distal edema.  +1 pulses.  Motor exam intact  Specialty Comments:  No specialty comments available.  Imaging: No results found.   PMFS History: Patient Active Problem List   Diagnosis Date Noted  . CAP (community acquired pneumonia) 02/18/2017  . Liver cirrhosis ([facility]) 02/18/2017  . Leukocytosis 02/18/2017  . Fever 02/18/2017  . Sinus tachycardia 02/18/2017  . Tachypnea 02/18/2017  . RLL pneumonia ([facility]) 02/18/2017  . Increased ammonia level 02/18/2017  . Retained metal fragment left scapula 12/26/2015  . Fracture of glenoid process of left scapula 12/29/2014  . Status post total shoulder arthroplasty 12/27/2014  . Cocaine dependence in remission ([facility]) 09/29/2014  . Severe heroin dependence in sustained remission ([facility]) 09/29/2014  . Opiate abuse, episodic ([facility]) 09/29/2014  . Mild tetrahydrocannabinol (THC) abuse 09/29/2014  . Adjustment disorder with depressed mood 09/29/2014  . Chronic pain syndrome 04/18/2014  . Primary osteoarthritis of left shoulder 04/18/2014  . Primary osteoarthritis of right knee 04/18/2014  . Primary osteoarthritis of left knee 04/18/2014  . ANXIETY 03/22/2010  . HYPERTENSION, BENIGN ESSENTIAL 03/22/2010  . ASTHMA 03/22/2010  . CONSTIPATION 03/22/2010  . HEPATITIS C 06/04/1995   Past Medical History:  Diagnosis Date  . Anxiety   . Arthritis   . Constipation   . DDD (degenerative disc disease), cervical   . DDD (degenerative disc disease), lumbar   . Gout   . Heart murmur    probable bicuspid AV with mild AS, mild MR by 04/22/14 Echo (Dr. Montez Morita)  . Hepatitis C     treated 2016   . History of blood transfusion   . HTN (hypertension)   . Hypertension   . Pneumonia 12/02/13    Family History  Problem Relation Age of Onset  . Heart disease Mother   . Kidney disease Mother   . Alcohol abuse Mother   . Breast cancer Cousin 40    Past Surgical History:  Procedure Laterality Date  . ABDOMINAL HYSTERECTOMY    . APPENDECTOMY    . BACK SURGERY    . CESAREAN SECTION     x 3  . COLONOSCOPY W/ POLYPECTOMY    . HARDWARE REMOVAL Left 12/26/2015   Procedure: REMOVAL K-WIRE LEFT SCAPULA;  Surgeon: Garald Balding, MD;  Location: [facility];  Service: Orthopedics;  Laterality: Left;  . INNER EAR SURGERY     blood vessel  . TONSILLECTOMY    . TOTAL SHOULDER ARTHROPLASTY Left 12/27/2014   Procedure: TOTAL SHOULDER ARTHROPLASTY;  Surgeon: Collier Salina  Sharlotte Alamo, MD;  Location: [facility];  Service: Orthopedics;  Laterality: Left;   Social History   Occupational History  . Not on file  Tobacco Use  . Smoking status: Former Smoker    Packs/day: 1.00    Years: 49.00    Pack years: 49.00    Types: Cigarettes    Last attempt to quit: 2007    Years since quitting: 12.6  . Smokeless tobacco: Never Used  . Tobacco comment: quit 2007  Substance and Sexual Activity  . Alcohol use: No  . Drug use: No    Types: Marijuana    Comment: 1 joint per week. last used heroin and cocaine in 1992.  Smokes Marijuana once a week., 12/22/15- "2 weeks ago"  . Sexual activity: Not on file

## 2018-03-27 ENCOUNTER — Other Ambulatory Visit: Payer: Self-pay | Admitting: Internal Medicine

## 2018-03-28 ENCOUNTER — Other Ambulatory Visit: Payer: Self-pay | Admitting: Internal Medicine

## 2018-03-30 ENCOUNTER — Other Ambulatory Visit: Payer: Self-pay | Admitting: Internal Medicine

## 2018-03-30 DIAGNOSIS — Z122 Encounter for screening for malignant neoplasm of respiratory organs: Secondary | ICD-10-CM

## 2018-04-20 ENCOUNTER — Encounter (HOSPITAL_COMMUNITY): Payer: Self-pay | Admitting: Emergency Medicine

## 2018-04-20 ENCOUNTER — Emergency Department (HOSPITAL_COMMUNITY): Payer: Medicare HMO

## 2018-04-20 ENCOUNTER — Other Ambulatory Visit: Payer: Self-pay

## 2018-04-20 ENCOUNTER — Observation Stay (HOSPITAL_COMMUNITY)
Admission: EM | Admit: 2018-04-20 | Discharge: 2018-04-21 | Disposition: A | Discharge status: Home / Self Care | Payer: Medicare HMO | Attending: Internal Medicine | Admitting: Internal Medicine

## 2018-04-20 DIAGNOSIS — J4541 Moderate persistent asthma with (acute) exacerbation: Secondary | ICD-10-CM | POA: Diagnosis present

## 2018-04-20 DIAGNOSIS — J209 Acute bronchitis, unspecified: Secondary | ICD-10-CM | POA: Diagnosis present

## 2018-04-20 DIAGNOSIS — J9601 Acute respiratory failure with hypoxia: Secondary | ICD-10-CM | POA: Diagnosis present

## 2018-04-20 DIAGNOSIS — G894 Chronic pain syndrome: Secondary | ICD-10-CM | POA: Diagnosis not present

## 2018-04-20 DIAGNOSIS — F1120 Opioid dependence, uncomplicated: Secondary | ICD-10-CM | POA: Insufficient documentation

## 2018-04-20 DIAGNOSIS — Z880 Allergy status to penicillin: Secondary | ICD-10-CM | POA: Diagnosis not present

## 2018-04-20 DIAGNOSIS — F1210 Cannabis abuse, uncomplicated: Secondary | ICD-10-CM | POA: Insufficient documentation

## 2018-04-20 DIAGNOSIS — Z66 Do not resuscitate: Secondary | ICD-10-CM | POA: Diagnosis not present

## 2018-04-20 DIAGNOSIS — Z886 Allergy status to analgesic agent status: Secondary | ICD-10-CM | POA: Insufficient documentation

## 2018-04-20 DIAGNOSIS — M5136 Other intervertebral disc degeneration, lumbar region: Secondary | ICD-10-CM | POA: Diagnosis not present

## 2018-04-20 DIAGNOSIS — F418 Other specified anxiety disorders: Secondary | ICD-10-CM | POA: Diagnosis not present

## 2018-04-20 DIAGNOSIS — B1920 Unspecified viral hepatitis C without hepatic coma: Secondary | ICD-10-CM | POA: Insufficient documentation

## 2018-04-20 DIAGNOSIS — F419 Anxiety disorder, unspecified: Secondary | ICD-10-CM

## 2018-04-20 DIAGNOSIS — I10 Essential (primary) hypertension: Secondary | ICD-10-CM | POA: Diagnosis present

## 2018-04-20 DIAGNOSIS — F32A Depression, unspecified: Secondary | ICD-10-CM | POA: Diagnosis present

## 2018-04-20 DIAGNOSIS — Z79899 Other long term (current) drug therapy: Secondary | ICD-10-CM | POA: Insufficient documentation

## 2018-04-20 DIAGNOSIS — M109 Gout, unspecified: Secondary | ICD-10-CM | POA: Diagnosis not present

## 2018-04-20 DIAGNOSIS — J440 Chronic obstructive pulmonary disease with acute lower respiratory infection: Secondary | ICD-10-CM | POA: Diagnosis not present

## 2018-04-20 DIAGNOSIS — K7460 Unspecified cirrhosis of liver: Secondary | ICD-10-CM | POA: Insufficient documentation

## 2018-04-20 DIAGNOSIS — Z87891 Personal history of nicotine dependence: Secondary | ICD-10-CM | POA: Insufficient documentation

## 2018-04-20 DIAGNOSIS — F329 Major depressive disorder, single episode, unspecified: Secondary | ICD-10-CM | POA: Diagnosis present

## 2018-04-20 DIAGNOSIS — J441 Chronic obstructive pulmonary disease with (acute) exacerbation: Principal | ICD-10-CM | POA: Diagnosis present

## 2018-04-20 HISTORY — DX: Chronic obstructive pulmonary disease, unspecified: J44.9

## 2018-04-20 LAB — CBC WITH DIFFERENTIAL/PLATELET
Abs Immature Granulocytes: 0.01 10*3/uL (ref 0.00–0.07)
BASOS ABS: 0 10*3/uL (ref 0.0–0.1)
BASOS PCT: 0 %
EOS ABS: 0 10*3/uL (ref 0.0–0.5)
EOS PCT: 1 %
HEMATOCRIT: 35.3 % — AB (ref 36.0–46.0)
Hemoglobin: 10.6 g/dL — ABNORMAL LOW (ref 12.0–15.0)
IMMATURE GRANULOCYTES: 0 %
LYMPHS ABS: 1.5 10*3/uL (ref 0.7–4.0)
Lymphocytes Relative: 34 %
MCH: 27.7 pg (ref 26.0–34.0)
MCHC: 30 g/dL (ref 30.0–36.0)
MCV: 92.2 fL (ref 80.0–100.0)
Monocytes Absolute: 0.5 10*3/uL (ref 0.1–1.0)
Monocytes Relative: 12 %
NEUTROS PCT: 53 %
NRBC: 0 % (ref 0.0–0.2)
Neutro Abs: 2.3 10*3/uL (ref 1.7–7.7)
PLATELETS: 211 10*3/uL (ref 150–400)
RBC: 3.83 MIL/uL — AB (ref 3.87–5.11)
RDW: 12.8 % (ref 11.5–15.5)
WBC: 4.4 10*3/uL (ref 4.0–10.5)

## 2018-04-20 LAB — BASIC METABOLIC PANEL
ANION GAP: 5 (ref 5–15)
BUN: 5 mg/dL — ABNORMAL LOW (ref 8–23)
CALCIUM: 9.9 mg/dL (ref 8.9–10.3)
CO2: 31 mmol/L (ref 22–32)
CREATININE: 0.58 mg/dL (ref 0.44–1.00)
Chloride: 100 mmol/L (ref 98–111)
GFR calc non Af Amer: >60 mL/min (ref >60)
Glucose, Bld: 96 mg/dL (ref 70–99)
Potassium: 3.8 mmol/L (ref 3.5–5.1)
SODIUM: 136 mmol/L (ref 135–145)

## 2018-04-20 LAB — I-STAT TROPONIN, ED: TROPONIN I, POC: 0 ng/mL (ref 0.00–0.08)

## 2018-04-20 LAB — BRAIN NATRIURETIC PEPTIDE: B NATRIURETIC PEPTIDE 5: 24.8 pg/mL (ref 0.0–100.0)

## 2018-04-20 MED ORDER — MORPHINE SULFATE (PF) 4 MG/ML IV SOLN
4.0000 mg | Freq: Once | INTRAVENOUS | Status: DC
  Filled 2018-04-20: qty 1

## 2018-04-20 MED ORDER — ENOXAPARIN SODIUM 40 MG/0.4ML ~~LOC~~ SOLN
40.0000 mg | SUBCUTANEOUS | Status: DC
  Administered 2018-04-20: 40 mg via SUBCUTANEOUS
  Filled 2018-04-20: qty 0.4

## 2018-04-20 MED ORDER — SODIUM CHLORIDE 0.9 % IV SOLN
500.0000 mg | Freq: Once | INTRAVENOUS | Status: AC
  Administered 2018-04-20: 500 mg via INTRAVENOUS
  Filled 2018-04-20 (×2): qty 500

## 2018-04-20 MED ORDER — HYDROCHLOROTHIAZIDE 25 MG PO TABS
25.0000 mg | ORAL_TABLET | Freq: Every day | ORAL | Status: DC
  Administered 2018-04-20: 25 mg via ORAL
  Filled 2018-04-20: qty 1

## 2018-04-20 MED ORDER — HYDROCODONE-ACETAMINOPHEN 5-325 MG PO TABS
1.0000 | ORAL_TABLET | Freq: Four times a day (QID) | ORAL | Status: DC | PRN
  Administered 2018-04-20 – 2018-04-21 (×2): 1 via ORAL
  Filled 2018-04-20 (×2): qty 1

## 2018-04-20 MED ORDER — LOSARTAN POTASSIUM 50 MG PO TABS
100.0000 mg | ORAL_TABLET | Freq: Every day | ORAL | Status: DC
  Administered 2018-04-20: 100 mg via ORAL
  Filled 2018-04-20: qty 2

## 2018-04-20 MED ORDER — PREDNISONE 20 MG PO TABS
40.0000 mg | ORAL_TABLET | Freq: Every day | ORAL | Status: DC
  Administered 2018-04-21: 40 mg via ORAL
  Filled 2018-04-20: qty 2

## 2018-04-20 MED ORDER — ALBUTEROL (5 MG/ML) CONTINUOUS INHALATION SOLN
10.0000 mg/h | INHALATION_SOLUTION | RESPIRATORY_TRACT | Status: DC
  Administered 2018-04-20: 10 mg/h via RESPIRATORY_TRACT
  Filled 2018-04-20: qty 20

## 2018-04-20 MED ORDER — DOXYCYCLINE HYCLATE 100 MG PO TABS
100.0000 mg | ORAL_TABLET | Freq: Two times a day (BID) | ORAL | Status: DC
  Administered 2018-04-21: 100 mg via ORAL
  Filled 2018-04-20: qty 1

## 2018-04-20 MED ORDER — IPRATROPIUM-ALBUTEROL 0.5-2.5 (3) MG/3ML IN SOLN
3.0000 mL | Freq: Four times a day (QID) | RESPIRATORY_TRACT | Status: DC
  Administered 2018-04-20: 3 mL via RESPIRATORY_TRACT
  Filled 2018-04-20: qty 3

## 2018-04-20 MED ORDER — IPRATROPIUM-ALBUTEROL 0.5-2.5 (3) MG/3ML IN SOLN
3.0000 mL | Freq: Three times a day (TID) | RESPIRATORY_TRACT | Status: DC
  Administered 2018-04-21: 3 mL via RESPIRATORY_TRACT
  Filled 2018-04-20: qty 3

## 2018-04-20 MED ORDER — ALBUTEROL SULFATE (2.5 MG/3ML) 0.083% IN NEBU: 2.5000 mg | INHALATION_SOLUTION | RESPIRATORY_TRACT | Status: DC | PRN

## 2018-04-20 MED ORDER — METHYLPREDNISOLONE SODIUM SUCC 125 MG IJ SOLR
125.0000 mg | Freq: Once | INTRAMUSCULAR | Status: AC
  Administered 2018-04-20: 125 mg via INTRAVENOUS
  Filled 2018-04-20: qty 2

## 2018-04-20 MED ORDER — LOSARTAN POTASSIUM-HCTZ 100-25 MG PO TABS: 1.0000 | ORAL_TABLET | Freq: Every day | ORAL | Status: DC

## 2018-04-20 MED ORDER — METHYLPREDNISOLONE SODIUM SUCC 125 MG IJ SOLR
60.0000 mg | Freq: Once | INTRAMUSCULAR | Status: AC
  Administered 2018-04-20: 60 mg via INTRAVENOUS
  Filled 2018-04-20: qty 2

## 2018-04-20 MED ORDER — ONDANSETRON HCL 4 MG/2ML IJ SOLN
4.0000 mg | Freq: Once | INTRAMUSCULAR | Status: DC
  Filled 2018-04-20: qty 2

## 2018-04-20 MED ORDER — SODIUM CHLORIDE 0.9 % IV SOLN
1.0000 g | Freq: Once | INTRAVENOUS | Status: AC
  Administered 2018-04-20: 1 g via INTRAVENOUS
  Filled 2018-04-20: qty 10

## 2018-04-20 NOTE — ED Triage Notes (Signed)
Pt in from home with c/o productive cough and back pain x 3-4 days. Has hx of chronic bronchitis and sputum is dark yellow. Temp 98.0, lethargic but a&ox4

## 2018-04-20 NOTE — H&P (Signed)
History and Physical    Ashley Riddle UJW:[id number] DOB: May 04, 1946 DOA: 04/20/2018  PCP: Audley Hose, MD Consultants:  [place]; Penelope Coop - GI Patient coming from:  Home - lives alone but her daughter checks on her daily; NOK: Daughter, [phone]  Chief Complaint: Cough, SOB  HPI: Ashley Riddle is a 72 y.o. female with medical history significant of HTN; Hep C; gout; and DDD presenting with cough, SOB.  "My knees were hurting real bad and plus I couldn't breathe good."  She has been coughing up a lot of cold here lately, thick and green-brown.  No fever.  Occasional SOB, which resolves with albuterol.  She was hoping Dr. Durward Fortes could inject her knees but she is in surgery today.   ED Course:   H/o asthma, SOB x few days with dark yellow sputum and back pain.  Using nebs without relief.  SOB with wheezing on arrival.  Given steroids, nebs, antibiotics (clear CXR).  Likely needs obs.  Review of Systems: As per HPI; otherwise review of systems reviewed and negative.   Ambulatory Status:  Ambulates without assistance  Past Medical History:  Diagnosis Date  . Anxiety   . Arthritis   . Constipation   . COPD (chronic obstructive pulmonary disease) ([place])   . DDD (degenerative disc disease), cervical   . DDD (degenerative disc disease), lumbar   . Gout   . Heart murmur    probable bicuspid AV with mild AS, mild MR by 04/22/14 Echo (Dr. Montez Morita)  . Hepatitis C    treated 2016   . History of blood transfusion   . Hypertension   . Pneumonia 12/02/13    Past Surgical History:  Procedure Laterality Date  . ABDOMINAL HYSTERECTOMY    . APPENDECTOMY    . BACK SURGERY    . CESAREAN SECTION     x 3  . COLONOSCOPY W/ POLYPECTOMY    . HARDWARE REMOVAL Left 12/26/2015   Procedure: REMOVAL K-WIRE LEFT SCAPULA;  Surgeon: Garald Balding, MD;  Location: [facility];  Service: Orthopedics;  Laterality: Left;  . INNER EAR SURGERY     blood vessel  .  TONSILLECTOMY    . TOTAL SHOULDER ARTHROPLASTY Left 12/27/2014   Procedure: TOTAL SHOULDER ARTHROPLASTY;  Surgeon: Garald Balding, MD;  Location: [facility];  Service: Orthopedics;  Laterality: Left;    Social History   Socioeconomic History  . Marital status: Widowed    Spouse name: Not on file  . Number of children: Not on file  . Years of education: Not on file  . Highest education level: Not on file  Occupational History  . Occupation: retired  Scientific laboratory technician  . Financial resource strain: Not on file  . Food insecurity:    Worry: Not on file    Inability: Not on file  . Transportation needs:    Medical: Not on file    Non-medical: Not on file  Tobacco Use  . Smoking status: Former Smoker    Packs/day: 1.00    Years: 49.00    Pack years: 49.00    Types: Cigarettes    Last attempt to quit: 2007    Years since quitting: 12.8  . Smokeless tobacco: Never Used  . Tobacco comment: quit 2007  Substance and Sexual Activity  . Alcohol use: No  . Drug use: No    Types: Marijuana    Comment: quit marijuana 12/18. last used heroin and cocaine in 1992.  Smokes Marijuana once  a week., 12/22/15- "2 weeks ago"  . Sexual activity: Not on file  Lifestyle  . Physical activity:    Days per week: Not on file    Minutes per session: Not on file  . Stress: Not on file  Relationships  . Social connections:    Talks on phone: Not on file    Gets together: Not on file    Attends religious service: Not on file    Active member of club or organization: Not on file    Attends meetings of clubs or organizations: Not on file    Relationship status: Not on file  . Intimate partner violence:    Fear of current or ex partner: Not on file    Emotionally abused: Not on file    Physically abused: Not on file    Forced sexual activity: Not on file  Other Topics Concern  . Not on file  Social History Narrative  . Not on file    Allergies  Allergen Reactions  . Aspirin Other (See Comments)     Due to history of hepatitis  . Penicillins Hives    Has patient had a PCN reaction causing immediate rash, facial/tongue/throat swelling, SOB or lightheadedness with hypotension: Yes Has patient had a PCN reaction causing severe rash involving mucus membranes or skin necrosis: No Has patient had a PCN reaction that required hospitalization No Has patient had a PCN reaction occurring within the last 10 years: No If all of the above answers are "NO", then may proceed with Cephalosporin use.     Family History  Problem Relation Age of Onset  . Heart disease Mother   . Kidney disease Mother   . Alcohol abuse Mother   . Breast cancer Cousin 50    Prior to Admission medications   Medication Sig Start Date End Date Taking? Authorizing Provider  albuterol (PROVENTIL HFA;VENTOLIN HFA) 108 (90 Base) MCG/ACT inhaler Inhale into the lungs every 6 (six) hours as needed for wheezing or shortness of breath.    [provider]  ALPRAZolam (XANAX) 0.25 MG tablet TAKE 1 TABLET TWICE DAILY AS NEEDED FOR ANXIETY 05/02/17   [provider]  amLODipine (NORVASC) 5 MG tablet Take 1 tablet (5 mg total) by mouth daily. 05/29/12   Oswald Hillock, MD  Cobalamine Combinations (B-12) [id number] MCG SUBL B12    [provider]  colchicine 0.6 MG tablet Take 0.6 mg by mouth daily as needed (gout).     [provider]  Denture Care Products (DENTURE ADHESIVE) CREA denture adhesive    [provider]  docusate sodium (COLACE) 100 MG capsule Take 100 mg by mouth daily as needed for mild constipation.     [provider]  DULoxetine (CYMBALTA) 30 MG capsule Take 1 capsule (30 mg total) by mouth daily. Patient taking differently: Take 30 mg by mouth daily as needed (FOR DEPRESSION).  09/29/14 12/19/16  Charlcie Cradle, MD  feeding supplement (BOOST / RESOURCE BREEZE) LIQD Take 1 Container by mouth 3 (three) times daily between meals. Patient not taking: Reported on  07/18/2017 02/21/17   Cristal Ford, DO  gabapentin (NEURONTIN) 100 MG capsule Take 200 mg by mouth at bedtime. 02/12/17   [provider]  HYDROcodone-acetaminophen (NORCO/VICODIN) 5-325 MG tablet Take 1-2 tablets by mouth every 4 (four) hours as needed. Patient not taking: Reported on 11/17/2017 01/24/16   Frederica Kuster, PA-C  HYDROcodone-acetaminophen (NORCO/VICODIN) 5-325 MG tablet hydrocodone 5 mg-acetaminophen 325 mg tablet  Take  1 tablet every 6 hours by oral route for 5 days.    [provider]  HYDROcodone-acetaminophen (NORCO/VICODIN) 5-325 MG tablet hydrocodone 5 mg-acetaminophen 325 mg tablet  Take 1 tablet every 6-8 hours by oral route as needed for 5 days.    [provider]  losartan-hydrochlorothiazide (HYZAAR) 100-25 MG tablet Take 1 tablet by mouth daily. 06/12/17   [provider]  Magnesium Hydroxide (MILK OF MAGNESIA PO) as needed.    [provider]  Multiple Vitamin (MULTIVITAMIN WITH MINERALS) TABS tablet Take 1 tablet by mouth daily.    [provider]  ondansetron (ZOFRAN ODT) 4 MG disintegrating tablet Take 1 tablet (4 mg total) by mouth every 8 (eight) hours as needed for nausea or vomiting. Patient not taking: Reported on 07/18/2017 05/20/16   Varney Biles, MD  orphenadrine (NORFLEX) 100 MG tablet Take 1 tablet (100 mg total) by mouth 2 (two) times daily as needed for muscle spasms. Patient not taking: Reported on 07/18/2017 01/24/16   Frederica Kuster, PA-C  sertraline (ZOLOFT) 100 MG tablet sertraline 100 mg tablet    [provider]  tiotropium (SPIRIVA HANDIHALER) 18 MCG inhalation capsule Spiriva with HandiHaler 18 mcg and inhalation capsules    [provider]  valsartan-hydrochlorothiazide (DIOVAN-HCT) 320-12.5 MG tablet Take 1 tablet by mouth daily. 12/11/15   [provider]    Physical Exam: Vitals:   04/20/18 1115 04/20/18 1130 04/20/18 1145 04/20/18 1200  BP: 136/81 130/78  133/73 131/69  Pulse: 77 89 93 (!) 105  Resp: 14 (!) 22 15 20   SpO2: 95% 95% 97% 94%  Weight:         General:  Appears calm and comfortable and is NAD Eyes:  PERRL, EOMI, normal lids, iris ENT:  grossly normal hearing, lips & tongue, mmm; poor/mostly absent dentition Neck:  no LAD, masses or thyromegaly; no carotid bruits Cardiovascular:  RRR, no m/r/g. No LE edema.  Respiratory:   CTA bilaterally with no rales/rhonchi, scattered wheezes.  Normal respiratory effort on [place] O2. Abdomen:  soft, NT, ND, NABS Back:   normal alignment, no CVAT Skin:  no rash or induration seen on limited exam Musculoskeletal:  grossly normal tone BUE/BLE, good ROM, no bony abnormality Lower extremity:  No LE edema.  Limited foot exam with no ulcerations.  2+ distal pulses. Psychiatric:  grossly normal mood and affect, speech fluent and appropriate, AOx3 Neurologic:  CN 2-12 grossly intact, moves all extremities in coordinated fashion, sensation intact    Radiological Exams on Admission: Dg Chest Port 1 View  Result Date: 04/20/2018 CLINICAL DATA:  Productive cough. EXAM: PORTABLE CHEST 1 VIEW COMPARISON:  09/15/2017 FINDINGS: The cardiac silhouette, mediastinal and hilar contours are stable given the AP projection. Mild tortuosity and calcification of the thoracic aorta. Low lung volumes with vascular crowding and streaky atelectasis. No definite infiltrates or effusions. The bony thorax is intact. Stable surgical changes from a left total shoulder arthroplasty. IMPRESSION: Low lung volumes with vascular crowding and streaky atelectasis but no definite infiltrates or effusions. Electronically Signed   By: Marijo Sanes M.D.   On: 04/20/2018 10:54    EKG: Independently reviewed.  NSR with rate 78;  no evidence of acute ischemia   Labs on Admission: I have personally reviewed the available labs and imaging studies at the time of the admission.  Pertinent labs:   Essentially normal BMP BNP  24.8 Troponin 0.00 WBC 4.4 Hgb 10.6  Assessment/Plan Principal Problem:   COPD with acute  exacerbation ([place]) Active Problems:   HYPERTENSION, BENIGN ESSENTIAL   Chronic pain syndrome   Anxiety and depression   COPD exacerbation -Patient's shortness of breath and productive cough are most likely caused by subacute COPD exacerbation.  -She has not been formally diagnosed with COPD but has long-standing smoking history and has been prescribed Spiriva in the past (for "prn" use, which is ineffective) -She does not have fever or leukocytosis.  -Chest x-ray is not consistent with pneumonia -She was given a continuous neb treatment in the ED with some improvement.   -will observe overnight (on telemetry x 12 hours given her persistent tachycardia). -Nebulizers: scheduled Duoneb and prn albuterol -Solu-Medrol -> PO prednisone -IV Azithromycin (has 3/3 cardinal symptoms) x 1 in ER, with transition to PO Doxycycline tomorrow -Anticipate d/c to home tomorrow   HTN -Continue Losartan/HCTZ  Chronic pain -I have reviewed this patient in the [place] Controlled Substances Reporting System.  She is receiving medications from only one provider only intermittently and appears to be taking them as prescribed. -Will continue Vicodin prn pain. -She has a h/o polysubstance abuse including heroin and cocaine and so opiate pain medications should be avoided when possible.  Anxiety/depression -She appears to be taking Zoloft prn, which is unlikely to be helpful.    DVT prophylaxis: Lovenox  Code Status:  DNR - confirmed with patient Family Communication: None present Disposition Plan:  Home once clinically improved Consults called: Nutrition/RT  Admission status: It is my clinical opinion that referral for OBSERVATION is reasonable and necessary in this patient based on the above information provided. The aforementioned taken together are felt to place the patient at high risk for further clinical  deterioration. However it is anticipated that the patient may be medically stable for discharge from the hospital within 24 to 48 hours.    Karmen Bongo MD [facility]  If note is complete, please contact covering daytime or nighttime physician. [email or web address] Password [email or web address]  04/20/2018, 2:57 PM

## 2018-04-20 NOTE — ED Provider Notes (Signed)
[facility] EMERGENCY DEPARTMENT Provider Note   CSN: [id number] Arrival date & time: 04/20/18  1018     History   Chief Complaint Chief Complaint  Patient presents with  . Shortness of Breath  . Cough    HPI Ashley Riddle is a 72 y.o. female.  Pt presents to the ED today with SOB and cough.  The pt said she has been sob for the past 3-4 days.  No fever.  She is coughing up dark yellow sputum.  The pt has been taking her nebs without improvement of sx.  She also has back pain and chronic knee pain.     Past Medical History:  Diagnosis Date  . Anxiety   . Arthritis   . Constipation   . DDD (degenerative disc disease), cervical   . DDD (degenerative disc disease), lumbar   . Gout   . Heart murmur    probable bicuspid AV with mild AS, mild MR by 04/22/14 Echo (Dr. Montez Morita)  . Hepatitis C    treated 2016   . History of blood transfusion   . Hypertension   . Pneumonia 12/02/13    Patient Active Problem List   Diagnosis Date Noted  . CAP (community acquired pneumonia) 02/18/2017  . Liver cirrhosis ([place]) 02/18/2017  . Leukocytosis 02/18/2017  . Fever 02/18/2017  . Sinus tachycardia 02/18/2017  . Tachypnea 02/18/2017  . RLL pneumonia ([place]) 02/18/2017  . Increased ammonia level 02/18/2017  . Retained metal fragment left scapula 12/26/2015  . Fracture of glenoid process of left scapula 12/29/2014  . Status post total shoulder arthroplasty 12/27/2014  . Cocaine dependence in remission ([place]) 09/29/2014  . Severe heroin dependence in sustained remission ([place]) 09/29/2014  . Opiate abuse, episodic ([place]) 09/29/2014  . Mild tetrahydrocannabinol (THC) abuse 09/29/2014  . Adjustment disorder with depressed mood 09/29/2014  . Chronic pain syndrome 04/18/2014  . Primary osteoarthritis of left shoulder 04/18/2014  . Primary osteoarthritis of right knee 04/18/2014  . Primary osteoarthritis of left knee 04/18/2014  . ANXIETY 03/22/2010  . HYPERTENSION,  BENIGN ESSENTIAL 03/22/2010  . ASTHMA 03/22/2010  . CONSTIPATION 03/22/2010  . HEPATITIS C 06/04/1995    Past Surgical History:  Procedure Laterality Date  . ABDOMINAL HYSTERECTOMY    . APPENDECTOMY    . BACK SURGERY    . CESAREAN SECTION     x 3  . COLONOSCOPY W/ POLYPECTOMY    . HARDWARE REMOVAL Left 12/26/2015   Procedure: REMOVAL K-WIRE LEFT SCAPULA;  Surgeon: Garald Balding, MD;  Location: [facility];  Service: Orthopedics;  Laterality: Left;  . INNER EAR SURGERY     blood vessel  . TONSILLECTOMY    . TOTAL SHOULDER ARTHROPLASTY Left 12/27/2014   Procedure: TOTAL SHOULDER ARTHROPLASTY;  Surgeon: Garald Balding, MD;  Location: [facility];  Service: Orthopedics;  Laterality: Left;     OB History   None      Home Medications    Prior to Admission medications   Medication Sig Start Date End Date Taking? Authorizing Provider  albuterol (PROVENTIL HFA;VENTOLIN HFA) 108 (90 Base) MCG/ACT inhaler Inhale into the lungs every 6 (six) hours as needed for wheezing or shortness of breath.    [provider]  ALPRAZolam (XANAX) 0.25 MG tablet TAKE 1 TABLET TWICE DAILY AS NEEDED FOR ANXIETY 05/02/17   [provider]  amLODipine (NORVASC) 5 MG tablet Take 1 tablet (5 mg total) by mouth daily. 05/29/12   Oswald Hillock, MD  Cobalamine Combinations (B-12) [phone] MCG SUBL B12    [provider]  colchicine 0.6 MG tablet Take 0.6 mg by mouth daily as needed (gout).     [provider]  Denture Care Products (DENTURE ADHESIVE) CREA denture adhesive    [provider]  docusate sodium (COLACE) 100 MG capsule Take 100 mg by mouth daily as needed for mild constipation.     [provider]  DULoxetine (CYMBALTA) 30 MG capsule Take 1 capsule (30 mg total) by mouth daily. Patient taking differently: Take 30 mg by mouth daily as needed (FOR DEPRESSION).  09/29/14 12/19/16  Charlcie Cradle, MD  feeding supplement (BOOST / RESOURCE BREEZE) LIQD Take 1  Container by mouth 3 (three) times daily between meals. Patient not taking: Reported on 07/18/2017 02/21/17   Cristal Ford, DO  gabapentin (NEURONTIN) 100 MG capsule Take 200 mg by mouth at bedtime. 02/12/17   [provider]  HYDROcodone-acetaminophen (NORCO/VICODIN) 5-325 MG tablet Take 1-2 tablets by mouth every 4 (four) hours as needed. Patient not taking: Reported on 11/17/2017 01/24/16   Frederica Kuster, PA-C  HYDROcodone-acetaminophen (NORCO/VICODIN) 5-325 MG tablet hydrocodone 5 mg-acetaminophen 325 mg tablet  Take 1 tablet every 6 hours by oral route for 5 days.    [provider]  HYDROcodone-acetaminophen (NORCO/VICODIN) 5-325 MG tablet hydrocodone 5 mg-acetaminophen 325 mg tablet  Take 1 tablet every 6-8 hours by oral route as needed for 5 days.    [provider]  losartan-hydrochlorothiazide (HYZAAR) 100-25 MG tablet Take 1 tablet by mouth daily. 06/12/17   [provider]  Magnesium Hydroxide (MILK OF MAGNESIA PO) as needed.    [provider]  Multiple Vitamin (MULTIVITAMIN WITH MINERALS) TABS tablet Take 1 tablet by mouth daily.    [provider]  ondansetron (ZOFRAN ODT) 4 MG disintegrating tablet Take 1 tablet (4 mg total) by mouth every 8 (eight) hours as needed for nausea or vomiting. Patient not taking: Reported on 07/18/2017 05/20/16   Varney Biles, MD  orphenadrine (NORFLEX) 100 MG tablet Take 1 tablet (100 mg total) by mouth 2 (two) times daily as needed for muscle spasms. Patient not taking: Reported on 07/18/2017 01/24/16   Frederica Kuster, PA-C  sertraline (ZOLOFT) 100 MG tablet sertraline 100 mg tablet    [provider]  tiotropium (SPIRIVA HANDIHALER) 18 MCG inhalation capsule Spiriva with HandiHaler 18 mcg and inhalation capsules    [provider]  valsartan-hydrochlorothiazide (DIOVAN-HCT) 320-12.5 MG tablet Take 1 tablet by mouth daily. 12/11/15   [provider]    Family  History Family History  Problem Relation Age of Onset  . Heart disease Mother   . Kidney disease Mother   . Alcohol abuse Mother   . Breast cancer Cousin 55    Social History Social History   Tobacco Use  . Smoking status: Former Smoker    Packs/day: 1.00    Years: 49.00    Pack years: 49.00    Types: Cigarettes    Last attempt to quit: 2007    Years since quitting: 12.8  . Smokeless tobacco: Never Used  . Tobacco comment: quit 2007  Substance Use Topics  . Alcohol use: No  . Drug use: No    Types: Marijuana    Comment: 1 joint per week. last used heroin and cocaine in 1992.  Smokes Marijuana once a week., 12/22/15- "2 weeks ago"     Allergies   Aspirin and Penicillins   Review of Systems Review  of Systems  Respiratory: Positive for cough, shortness of breath and wheezing.   All other systems reviewed and are negative.    Physical Exam Updated Vital Signs BP 131/69   Pulse (!) 105   Resp 20   Wt 93 kg   SpO2 94%   BMI 38.73 kg/m   Physical Exam  Constitutional: She is oriented to person, place, and time. She appears well-developed and well-nourished.  HENT:  Head: Normocephalic and atraumatic.  Mouth/Throat: Oropharynx is clear and moist.  Eyes: Pupils are equal, round, and reactive to light. EOM are normal.  Neck: Normal range of motion. Neck supple.  Cardiovascular: Normal rate and regular rhythm.  Pulmonary/Chest: Accessory muscle usage present. Tachypnea noted. She has wheezes.  Abdominal: Soft. Bowel sounds are normal.  Musculoskeletal: Normal range of motion.       Right lower leg: Normal.       Left lower leg: Normal.  Neurological: She is alert and oriented to person, place, and time.  Skin: Skin is warm and dry. Capillary refill takes less than 2 seconds.  Psychiatric: She has a normal mood and affect. Her behavior is normal.  Nursing note and vitals reviewed.    ED Treatments / Results  Labs (all labs ordered are listed, but only  abnormal results are displayed) Labs Reviewed  BASIC METABOLIC PANEL - Abnormal; Notable for the following components:      Result Value   BUN 5 (*)    All other components within normal limits  CBC WITH DIFFERENTIAL/PLATELET - Abnormal; Notable for the following components:   RBC 3.83 (*)    Hemoglobin 10.6 (*)    HCT 35.3 (*)    All other components within normal limits  BRAIN NATRIURETIC PEPTIDE  URINALYSIS, ROUTINE W REFLEX MICROSCOPIC  I-STAT TROPONIN, ED    EKG EKG Interpretation  Date/Time:  Monday April 20 2018 10:33:57 EST Ventricular Rate:  78 PR Interval:    QRS Duration: 90 QT Interval:  373 QTC Calculation: 425 R Axis:   20 Text Interpretation:  Sinus rhythm Confirmed by [facility], : ([id number]), editor [place], Deirdre [phone]) on 04/20/2018 10:42:59 AM   Radiology Dg Chest Port 1 View  Result Date: 04/20/2018 CLINICAL DATA:  Productive cough. EXAM: PORTABLE CHEST 1 VIEW COMPARISON:  09/15/2017 FINDINGS: The cardiac silhouette, mediastinal and hilar contours are stable given the AP projection. Mild tortuosity and calcification of the thoracic aorta. Low lung volumes with vascular crowding and streaky atelectasis. No definite infiltrates or effusions. The bony thorax is intact. Stable surgical changes from a left total shoulder arthroplasty. IMPRESSION: Low lung volumes with vascular crowding and streaky atelectasis but no definite infiltrates or effusions. Electronically Signed   By: Marijo Sanes M.D.   On: 04/20/2018 10:54    Procedures Procedures (including critical care time)  Medications Ordered in ED Medications  albuterol (PROVENTIL,VENTOLIN) solution continuous neb (0 mg/hr Nebulization Stopped 04/20/18 1205)  azithromycin (ZITHROMAX) 500 mg in sodium chloride 0.9 % 250 mL IVPB (has no administration in time range)  morphine 4 MG/ML injection 4 mg (has no administration in time range)  ondansetron (ZOFRAN) injection 4 mg (has no administration in  time range)  methylPREDNISolone sodium succinate (SOLU-MEDROL) 125 mg/2 mL injection 125 mg (125 mg Intravenous Given 04/20/18 1058)  cefTRIAXone (ROCEPHIN) 1 g in sodium chloride 0.9 % 100 mL IVPB (1 g Intravenous New Bag/Given 04/20/18 1227)     Initial Impression / Assessment and Plan / ED Course  I have reviewed the  triage vital signs and the nursing notes.  Pertinent labs & imaging results that were available during my care of the patient were reviewed by me and considered in my medical decision making (see chart for details).    Pt is breathing better after nebs and steroids, but is still very wheezy.  O2 sats drop to the low 90s with movement, so I put her on 2L oxygen via [place].  The pt was d/w Dr. Lorin Mercy ([facility]) for admission.   Final Clinical Impressions(s) / ED Diagnoses   Final diagnoses:  Acute bronchitis, unspecified organism  Moderate persistent asthma with exacerbation    ED Discharge Orders    None       Isla Pence, MD 04/20/18 1317

## 2018-04-21 ENCOUNTER — Encounter (INDEPENDENT_AMBULATORY_CARE_PROVIDER_SITE_OTHER): Payer: Self-pay | Admitting: Orthopaedic Surgery

## 2018-04-21 ENCOUNTER — Ambulatory Visit (INDEPENDENT_AMBULATORY_CARE_PROVIDER_SITE_OTHER): Payer: Medicare HMO | Admitting: Orthopaedic Surgery

## 2018-04-21 VITALS — BP 157/84 | HR 88 | Ht 61.0 in | Wt 205.0 lb

## 2018-04-21 DIAGNOSIS — M170 Bilateral primary osteoarthritis of knee: Secondary | ICD-10-CM | POA: Diagnosis not present

## 2018-04-21 DIAGNOSIS — J441 Chronic obstructive pulmonary disease with (acute) exacerbation: Secondary | ICD-10-CM | POA: Diagnosis not present

## 2018-04-21 DIAGNOSIS — M1711 Unilateral primary osteoarthritis, right knee: Secondary | ICD-10-CM

## 2018-04-21 DIAGNOSIS — M1712 Unilateral primary osteoarthritis, left knee: Secondary | ICD-10-CM

## 2018-04-21 LAB — HIV ANTIBODY (ROUTINE TESTING W REFLEX): HIV Screen 4th Generation wRfx: NONREACTIVE

## 2018-04-21 MED ORDER — BUDESONIDE 0.5 MG/2ML IN SUSP: 0.5000 mg | Freq: Two times a day (BID) | RESPIRATORY_TRACT | 0 refills | Status: DC

## 2018-04-21 MED ORDER — LIDOCAINE HCL 1 % IJ SOLN
2.0000 mL | INTRAMUSCULAR | Status: AC | PRN
  Administered 2018-04-21: 2 mL

## 2018-04-21 MED ORDER — METHYLPREDNISOLONE ACETATE 40 MG/ML IJ SUSP
80.0000 mg | INTRAMUSCULAR | Status: AC | PRN
  Administered 2018-04-21: 80 mg

## 2018-04-21 MED ORDER — BUPIVACAINE HCL 0.5 % IJ SOLN
2.0000 mL | INTRAMUSCULAR | Status: AC | PRN
  Administered 2018-04-21: 2 mL via INTRA_ARTICULAR

## 2018-04-21 MED ORDER — PREDNISONE 20 MG PO TABS: 40.0000 mg | ORAL_TABLET | Freq: Every day | ORAL | 0 refills | Status: AC

## 2018-04-21 MED ORDER — DOXYCYCLINE HYCLATE 100 MG PO TABS: 100.0000 mg | ORAL_TABLET | Freq: Two times a day (BID) | ORAL | 0 refills | Status: AC

## 2018-04-21 NOTE — Progress Notes (Signed)
Pt ambulated by tech , pt maintained 02 saturations above 95% wile ambulating ; no s/s of distress noted

## 2018-04-21 NOTE — Progress Notes (Signed)
Office Visit Note   Patient: Ashley Riddle           Date of Birth: 1946-01-30           MRN: [id number] Visit Date: 04/21/2018              Requested by: Audley Hose, MD [place],  [id number] PCP: Audley Hose, MD   Assessment & Plan: Visit Diagnoses:  1. Primary osteoarthritis of right knee   2. Primary osteoarthritis of left knee     Plan: I am very end-stage osteoarthritis both knees.  Long discussion regarding treatment options and review of past treatments.  Will inject her knees with cortisone and pre-CERT Visco supplementation.  Definitive treatment would be knee replacement.  I am not sure, from she is stable enough  Follow-Up Instructions: Return will pre cert visco.   Orders:  Orders Placed This Encounter  Procedures  . Large Joint Inj: bilateral knee   No orders of the defined types were placed in this encounter.     Procedures: Large Joint Inj: bilateral knee on 04/21/2018 2:28 PM Indications: diagnostic evaluation Details: 25 G 1.5 in needle, anteromedial approach  Arthrogram: No  Medications (Right): 2 mL lidocaine 1 %; 2 mL bupivacaine 0.5 %; 80 mg methylPREDNISolone acetate 40 MG/ML Medications (Left): 2 mL lidocaine 1 %; 2 mL bupivacaine 0.5 %; 80 mg methylPREDNISolone acetate 40 MG/ML Consent was given by the patient. Immediately prior to procedure a time out was called to verify the correct patient, procedure, equipment, support staff and site/side marked as required. Patient was prepped and draped in the usual sterile fashion.       Clinical Data: No additional findings.   Subjective: Chief Complaint  Patient presents with  . Follow-up    BI LAT KNEE PAIN PT HAD SHOTS IN R AND L KNEE 01/27/18 WORKED UP UNTIL ABOUT A FEW DAYS AGO  Mrs. Savell was discharged from the hospital today for an overnight stay related to her COPD.  She has had an exacerbation of her bilateral knee pain.  Prior  evaluation With films demonstrates end-stage osteoarthritis. HPI  Review of Systems  Constitutional: Negative for fatigue and fever.  HENT: Negative for ear pain.   Eyes: Negative for pain.  Respiratory: Negative for cough and shortness of breath.   Cardiovascular: Positive for leg swelling.  Gastrointestinal: Negative for constipation and diarrhea.  Genitourinary: Negative for difficulty urinating.  Musculoskeletal: Negative for back pain and neck pain.  Skin: Negative for rash.  Allergic/Immunologic: Negative for food allergies.  Neurological: Positive for weakness. Negative for numbness.  Psychiatric/Behavioral: Positive for sleep disturbance.     Objective: Vital Signs: BP (!) 157/84 (BP Location: Left Arm, Patient Position: Sitting, Cuff Size: Normal)   Pulse 88   Ht 5\' 1"  (1.549 m)   Wt 205 lb (93 kg)   BMI 38.73 kg/m   Physical Exam  Constitutional: She is oriented to person, place, and time. She appears well-developed and well-nourished.  HENT:  Mouth/Throat: Oropharynx is clear and moist.  Eyes: Pupils are equal, round, and reactive to light. EOM are normal.  Pulmonary/Chest: Effort normal.  Neurological: She is alert and oriented to person, place, and time.  Skin: Skin is warm and dry.  Psychiatric: She has a normal mood and affect. Her behavior is normal.    Ortho Exam no difficulty breathing on exam.  Tearful.  Walks with a limp based on redness of both  of her knees.  Lacks a few degrees to full extension bilaterally flexed over 100 degrees without instability.  Minimal effusion.  Some patellar crepitation.  Predominant medial joint pain bilaterally  Specialty Comments:  No specialty comments available.  Imaging: No results found.   PMFS History: Patient Active Problem List   Diagnosis Date Noted  . COPD with acute exacerbation ([place]) 04/20/2018  . Anxiety and depression 04/20/2018  . CAP (community acquired pneumonia) 02/18/2017  . Liver cirrhosis  ([place]) 02/18/2017  . Leukocytosis 02/18/2017  . Sinus tachycardia 02/18/2017  . Tachypnea 02/18/2017  . RLL pneumonia ([place]) 02/18/2017  . Increased ammonia level 02/18/2017  . Retained metal fragment left scapula 12/26/2015  . Fracture of glenoid process of left scapula 12/29/2014  . Status post total shoulder arthroplasty 12/27/2014  . Cocaine dependence in remission ([place]) 09/29/2014  . Severe heroin dependence in sustained remission ([place]) 09/29/2014  . Opiate abuse, episodic ([place]) 09/29/2014  . Mild tetrahydrocannabinol (THC) abuse 09/29/2014  . Chronic pain syndrome 04/18/2014  . Primary osteoarthritis of left shoulder 04/18/2014  . Primary osteoarthritis of right knee 04/18/2014  . Primary osteoarthritis of left knee 04/18/2014  . HYPERTENSION, BENIGN ESSENTIAL 03/22/2010  . CONSTIPATION 03/22/2010  . HEPATITIS C 06/04/1995   Past Medical History:  Diagnosis Date  . Anxiety   . Arthritis   . Constipation   . COPD (chronic obstructive pulmonary disease) ([place])   . DDD (degenerative disc disease), cervical   . DDD (degenerative disc disease), lumbar   . Gout   . Heart murmur    probable bicuspid AV with mild AS, mild MR by 04/22/14 Echo (Dr. Montez Morita)  . Hepatitis C    treated 2016   . History of blood transfusion   . Hypertension   . Pneumonia 12/02/13    Family History  Problem Relation Age of Onset  . Heart disease Mother   . Kidney disease Mother   . Alcohol abuse Mother   . Breast cancer Cousin 51    Past Surgical History:  Procedure Laterality Date  . ABDOMINAL HYSTERECTOMY    . APPENDECTOMY    . BACK SURGERY    . CESAREAN SECTION     x 3  . COLONOSCOPY W/ POLYPECTOMY    . HARDWARE REMOVAL Left 12/26/2015   Procedure: REMOVAL K-WIRE LEFT SCAPULA;  Surgeon: Garald Balding, MD;  Location: [facility];  Service: Orthopedics;  Laterality: Left;  . INNER EAR SURGERY     blood vessel  . TONSILLECTOMY    . TOTAL SHOULDER ARTHROPLASTY Left 12/27/2014   Procedure:  TOTAL SHOULDER ARTHROPLASTY;  Surgeon: Garald Balding, MD;  Location: [facility];  Service: Orthopedics;  Laterality: Left;   Social History   Occupational History  . Occupation: retired  Tobacco Use  . Smoking status: Former Smoker    Packs/day: 1.00    Years: 49.00    Pack years: 49.00    Types: Cigarettes    Last attempt to quit: 2007    Years since quitting: 12.8  . Smokeless tobacco: Never Used  . Tobacco comment: quit 2007  Substance and Sexual Activity  . Alcohol use: No  . Drug use: No    Types: Marijuana    Comment: quit marijuana 12/18. last used heroin and cocaine in 1992.  Smokes Marijuana once a week., 12/22/15- "2 weeks ago"  . Sexual activity: Not on file

## 2018-04-21 NOTE — Care Management Note (Signed)
Case Management Note  Patient Details  Name: Ashley Riddle MRN: [id number] Date of Birth: Dec 21, 1945  Subjective/Objective:  72 yo female presented with cough and SOB.               Action/Plan: CM met with patient/daughter to discuss transitional needs. Patient lives at home alone, independent with ADLs, with no DME in use. PCP verified as: Dr. Latanya Presser; pharmacy of choice: [facility] [place]. DME preference provided for home nebulizer, with [facility] selected. DME referral given to [facility] liaison; AVS updated. Patient's daughter will provide transportation home. No further needs from CM.   Expected Discharge Date:  04/21/18               Expected Discharge Plan:  Home/Self Care  In-House Referral:  NA  Discharge planning Services  CM Consult  Post Acute Care Choice:  Durable Medical Equipment Choice offered to:  Patient  DME Arranged:  Nebulizer machine DME Agency:  [place]:  NA [place] Agency:  NA  Status of Service:  Completed, signed off  If discussed at [place] of Stay Meetings, dates discussed:    Additional Comments:  Midge Minium RN, BSN, NCM-BC, ACM-RN [phone] 04/21/2018, 11:51 AM

## 2018-04-21 NOTE — Discharge Summary (Signed)
Physician Discharge Summary  Ashley Riddle YDX:[id number] DOB: 11-Jun-1945 DOA: 04/20/2018  PCP: Audley Hose, MD  Admit date: 04/20/2018 Discharge date: 04/21/2018  Admitted From: Home  Disposition:  Home   Recommendations for Outpatient Follow-up:  1. Follow up with PCP in 1-2 weeks 2. Please obtain BMP/CBC in one week your next doctors visit.  3. Complete Doxycyline 100mg  po bid for 5 days. Prednisone 40mg  po 5 days  4. Neb Prescription given   Home Health: None  Equipment/Devices: Neb  Discharge Condition: Stable CODE STATUS: Full  Diet recommendation:  2g Na Diet   Brief/Interim Summary: 72 year old with a history of hepatitis C, essential hypertension, gout, degenerative disc disease came to the hospital with COPD exacerbation.  She was initially found to be short of breath but over the course of several hours her breathing symptoms improved.  Patient was started on oral antibiotics along with nebulizers and IV steroids.  The following day she started doing much better and was ambulating in the hallway while saturating greater than 90%. Patient has reached maximum benefit from hospital stay and stable to be discharged with recommendations as stated above.   Discharge Diagnoses:  Principal Problem:   COPD with acute exacerbation ([place]) Active Problems:   HYPERTENSION, BENIGN ESSENTIAL   Chronic pain syndrome   Anxiety and depression  Acute respiratory distress, improved Acute mild exacerbation of mild persistent COPD, improved - We will transition patient to oral prednisone to complete 5-day course.  Bronchodilator treatments at home.  Nebulizer prescription given.  We will also add Pulmicort to be taken twice daily.  I have recommended she follows up with outpatient primary care provider and get referral to get pulmonary function tests so she can be on maintenance medication if necessary.  Essential hypertension -Resume her home losartan and  hydrochlorothiazide  Chronic pain -Resume home medications.  History of anxiety and depression - Resume home meds  Patient was on Lovenox for DVT prophylaxis while here She is DNR Discharged today in stable condition.  Discharge Instructions   Allergies as of 04/21/2018      Reactions   Aspirin Other (See Comments)   Due to history of hepatitis   Penicillins Hives   Has patient had a PCN reaction causing immediate rash, facial/tongue/throat swelling, SOB or lightheadedness with hypotension: Yes Has patient had a PCN reaction causing severe rash involving mucus membranes or skin necrosis: No Has patient had a PCN reaction that required hospitalization No Has patient had a PCN reaction occurring within the last 10 years: No If all of the above answers are "NO", then may proceed with Cephalosporin use.      Medication List    TAKE these medications   albuterol 108 (90 Base) MCG/ACT inhaler Commonly known as:  PROVENTIL HFA;VENTOLIN HFA Inhale 2 puffs into the lungs every 2 (two) hours as needed for wheezing or shortness of breath.   budesonide 0.5 MG/2ML nebulizer solution Commonly known as:  PULMICORT Take 2 mLs (0.5 mg total) by nebulization 2 (two) times daily.   clotrimazole-betamethasone cream Commonly known as:  LOTRISONE Apply 1 application topically 2 (two) times daily as needed (rash under breasts).   colchicine 0.6 MG tablet Take 0.6 mg by mouth daily as needed (gout flares). Colcrys   doxycycline 100 MG tablet Commonly known as:  VIBRA-TABS Take 1 tablet (100 mg total) by mouth every 12 (twelve) hours for 5 days.   DULoxetine 30 MG capsule Commonly known as:  CYMBALTA Take 1 capsule (30  mg total) by mouth daily.   HYDROcodone-acetaminophen 5-325 MG tablet Commonly known as:  NORCO/VICODIN Take 1-2 tablets by mouth every 4 (four) hours as needed. What changed:    how much to take  when to take this  reasons to take this    losartan-hydrochlorothiazide 100-25 MG tablet Commonly known as:  HYZAAR Take 1 tablet by mouth at bedtime.   multivitamin with minerals Tabs tablet Take 1 tablet by mouth daily.   predniSONE 20 MG tablet Commonly known as:  DELTASONE Take 2 tablets (40 mg total) by mouth daily with breakfast for 5 days. Start taking on:  04/22/2018   sertraline 100 MG tablet Commonly known as:  ZOLOFT Take 50 mg by mouth daily as needed (depression).   SPIRIVA HANDIHALER 18 MCG inhalation capsule Generic drug:  tiotropium Place 18 mcg into inhaler and inhale daily as needed (shortness of breath).   VITAMIN B-12 PO Take 1 tablet by mouth daily.      Follow-up Information    Bakare, Mobolaji B, MD. Schedule an appointment as soon as possible for a visit in 2 week(s).   Specialty:  Internal Medicine Contact information: [place] [address] [phone]          Allergies  Allergen Reactions  . Aspirin Other (See Comments)    Due to history of hepatitis  . Penicillins Hives    Has patient had a PCN reaction causing immediate rash, facial/tongue/throat swelling, SOB or lightheadedness with hypotension: Yes Has patient had a PCN reaction causing severe rash involving mucus membranes or skin necrosis: No Has patient had a PCN reaction that required hospitalization No Has patient had a PCN reaction occurring within the last 10 years: No If all of the above answers are "NO", then may proceed with Cephalosporin use.     You were cared for by a hospitalist during your hospital stay. If you have any questions about your discharge medications or the care you received while you were in the hospital after you are discharged, you can call the unit and asked to speak with the hospitalist on call if the hospitalist that took care of you is not available. Once you are discharged, your primary care physician will handle any further medical issues. Please note that no  refills for any discharge medications will be authorized once you are discharged, as it is imperative that you return to your primary care physician (or establish a relationship with a primary care physician if you do not have one) for your aftercare needs so that they can reassess your need for medications and monitor your lab values.  Consultations:  None    Procedures/Studies: Dg Chest Port 1 View  Result Date: 04/20/2018 CLINICAL DATA:  Productive cough. EXAM: PORTABLE CHEST 1 VIEW COMPARISON:  09/15/2017 FINDINGS: The cardiac silhouette, mediastinal and hilar contours are stable given the AP projection. Mild tortuosity and calcification of the thoracic aorta. Low lung volumes with vascular crowding and streaky atelectasis. No definite infiltrates or effusions. The bony thorax is intact. Stable surgical changes from a left total shoulder arthroplasty. IMPRESSION: Low lung volumes with vascular crowding and streaky atelectasis but no definite infiltrates or effusions. Electronically Signed   By: Marijo Sanes M.D.   On: 04/20/2018 10:54      Subjective: Feels better. Would like to go home.   General = no fevers, chills, dizziness, malaise, fatigue HEENT/EYES = negative for pain, redness, loss of vision, double vision, blurred vision, loss  of hearing, sore throat, hoarseness, dysphagia Cardiovascular= negative for chest pain, palpitation, murmurs, lower extremity swelling Respiratory/lungs= negative for shortness of breath, cough, hemoptysis, wheezing, mucus production Gastrointestinal= negative for nausea, vomiting,, abdominal pain, melena, hematemesis Genitourinary= negative for Dysuria, Hematuria, Change in Urinary Frequency MSK = Negative for arthralgia, myalgias, Back Pain, Joint swelling  Neurology= Negative for headache, seizures, numbness, tingling  Psychiatry= Negative for anxiety, depression, suicidal and homocidal ideation Allergy/Immunology= Medication/Food allergy as listed   Skin= Negative for Rash, lesions, ulcers, itching    Discharge Exam: Vitals:   04/21/18 1005 04/21/18 1039  BP: (!) 165/95   Pulse: 86   Resp: 16   Temp: 97.6 F (36.4 C)   SpO2: 99% 95%   Vitals:   04/21/18 0033 04/21/18 0605 04/21/18 1005 04/21/18 1039  BP: 131/77 (!) 151/67 (!) 165/95   Pulse: 88 86 86   Resp: 16 16 16    Temp:   97.6 F (36.4 C)   TempSrc:   Oral   SpO2: 96% 93% 99% 95%  Weight:        General: Pt is alert, awake, not in acute distress Cardiovascular: RRR, S1/S2 +, no rubs, no gallops Respiratory: CTA bilaterally, no wheezing, no rhonchi Abdominal: Soft, NT, ND, bowel sounds + Extremities: no edema, no cyanosis    The results of significant diagnostics from this hospitalization (including imaging, microbiology, ancillary and laboratory) are listed below for reference.     Microbiology: No results found for this or any previous visit (from the past 240 hour(s)).   Labs: BNP (last 3 results) Recent Labs    04/20/18 1051  BNP 95.0   Basic Metabolic Panel: Recent Labs  Lab 04/20/18 1051  NA 136  K 3.8  CL 100  CO2 31  GLUCOSE 96  BUN 5*  CREATININE 0.58  CALCIUM 9.9   Liver Function Tests: No results for input(s): AST, ALT, ALKPHOS, BILITOT, PROT, ALBUMIN in the last 168 hours. No results for input(s): LIPASE, AMYLASE in the last 168 hours. No results for input(s): AMMONIA in the last 168 hours. CBC: Recent Labs  Lab 04/20/18 1051  WBC 4.4  NEUTROABS 2.3  HGB 10.6*  HCT 35.3*  MCV 92.2  PLT 211   Cardiac Enzymes: No results for input(s): CKTOTAL, CKMB, CKMBINDEX, TROPONINI in the last 168 hours. BNP: Invalid input(s): POCBNP CBG: No results for input(s): GLUCAP in the last 168 hours. D-Dimer No results for input(s): DDIMER in the last 72 hours. Hgb A1c No results for input(s): HGBA1C in the last 72 hours. Lipid Profile No results for input(s): CHOL, HDL, LDLCALC, TRIG, CHOLHDL, LDLDIRECT in the last 72  hours. Thyroid function studies No results for input(s): TSH, T4TOTAL, T3FREE, THYROIDAB in the last 72 hours.  Invalid input(s): FREET3 Anemia work up No results for input(s): VITAMINB12, FOLATE, FERRITIN, TIBC, IRON, RETICCTPCT in the last 72 hours. Urinalysis    Component Value Date/Time   COLORURINE YELLOW 02/18/2017 1303   APPEARANCEUR CLEAR 02/18/2017 1303   LABSPEC 1.011 02/18/2017 1303   PHURINE 8.0 02/18/2017 1303   GLUCOSEU NEGATIVE 02/18/2017 1303   HGBUR NEGATIVE 02/18/2017 1303   BILIRUBINUR NEGATIVE 02/18/2017 1303   KETONESUR NEGATIVE 02/18/2017 1303   PROTEINUR NEGATIVE 02/18/2017 1303   UROBILINOGEN 0.2 08/20/2014 0554   NITRITE NEGATIVE 02/18/2017 1303   LEUKOCYTESUR NEGATIVE 02/18/2017 1303   Sepsis Labs Invalid input(s): PROCALCITONIN,  WBC,  LACTICIDVEN Microbiology No results found for this or any previous visit (from the past 240 hour(s)).   Time  coordinating discharge:  I have spent 35 minutes face to face with the patient and on the ward discussing the patients care, assessment, plan and disposition with other care givers. >50% of the time was devoted counseling the patient about the risks and benefits of treatment/Discharge disposition and coordinating care.   SIGNED:   Damita Lack, MD  [facility] 04/21/2018, 11:13 AM Pager   If 7PM-7AM, please contact night-coverage [email or web address] Password [email or web address]

## 2018-04-23 ENCOUNTER — Telehealth (INDEPENDENT_AMBULATORY_CARE_PROVIDER_SITE_OTHER): Payer: Self-pay

## 2018-04-23 NOTE — Telephone Encounter (Signed)
Submitted VOB for Orthovisc series, bilateral knee. 

## 2018-05-19 ENCOUNTER — Telehealth (INDEPENDENT_AMBULATORY_CARE_PROVIDER_SITE_OTHER): Payer: Self-pay

## 2018-05-19 NOTE — Telephone Encounter (Signed)
Please schedule patient an appointment with Dr. Durward Fortes for gel injection.  Thank you.  Patient is approved for Orthovisc series, bilateral knee. Buy & Bill Covered at 100% through her insurance. No Co-pay No PA required

## 2018-05-20 NOTE — Telephone Encounter (Signed)
LMOM for patient to call and schedule Orthovisc injections 

## 2018-07-08 ENCOUNTER — Telehealth (INDEPENDENT_AMBULATORY_CARE_PROVIDER_SITE_OTHER): Payer: Self-pay | Admitting: Orthopaedic Surgery

## 2018-07-08 NOTE — Telephone Encounter (Signed)
Patient's daughter Mariann Laster called to schedule her gel injections for her knees.  Patient was approved on 05/19/18 and checking to see if this needs to be resubmitted for approval.

## 2018-07-08 NOTE — Telephone Encounter (Signed)
Yes

## 2018-07-08 NOTE — Telephone Encounter (Signed)
See message below.  Please send message back to me, so I can apply. Thank you.

## 2018-07-09 ENCOUNTER — Telehealth (INDEPENDENT_AMBULATORY_CARE_PROVIDER_SITE_OTHER): Payer: Self-pay

## 2018-07-09 NOTE — Telephone Encounter (Signed)
Noted! Thank you

## 2018-07-09 NOTE — Telephone Encounter (Signed)
Okay thank you!  I'll let the patient know we will call her when she is approved again.

## 2018-07-09 NOTE — Telephone Encounter (Signed)
Submitted VOB for Orthovisc series, bilateral knee. 

## 2018-07-10 ENCOUNTER — Telehealth (INDEPENDENT_AMBULATORY_CARE_PROVIDER_SITE_OTHER): Payer: Self-pay

## 2018-07-10 NOTE — Telephone Encounter (Signed)
Please schedule patient an appointment for gel injection with Dr. Durward Fortes.  Thank You  Approved for Orthovisc series, bilateral knee. Buy & Bill Covered at 100% through her insurance No Co-pay No PA required

## 2018-07-15 ENCOUNTER — Telehealth (INDEPENDENT_AMBULATORY_CARE_PROVIDER_SITE_OTHER): Payer: Self-pay | Admitting: Orthopaedic Surgery

## 2018-07-15 NOTE — Telephone Encounter (Signed)
Patient scheduled Orthovisc injections

## 2018-07-20 ENCOUNTER — Encounter (INDEPENDENT_AMBULATORY_CARE_PROVIDER_SITE_OTHER): Payer: Self-pay | Admitting: Orthopaedic Surgery

## 2018-07-20 ENCOUNTER — Ambulatory Visit (INDEPENDENT_AMBULATORY_CARE_PROVIDER_SITE_OTHER): Payer: Medicare Other | Admitting: Orthopaedic Surgery

## 2018-07-20 VITALS — BP 122/77 | HR 79 | Ht 61.0 in | Wt 209.0 lb

## 2018-07-20 DIAGNOSIS — M1712 Unilateral primary osteoarthritis, left knee: Secondary | ICD-10-CM

## 2018-07-20 DIAGNOSIS — M1711 Unilateral primary osteoarthritis, right knee: Secondary | ICD-10-CM

## 2018-07-20 MED ORDER — HYALURONAN 30 MG/2ML IX SOSY
30.0000 mg | PREFILLED_SYRINGE | INTRA_ARTICULAR | Status: AC | PRN
  Administered 2018-07-20: 30 mg via INTRA_ARTICULAR

## 2018-07-20 NOTE — Progress Notes (Signed)
Office Visit Note   Patient: Ashley Riddle           Date of Birth: [birth year]           MRN: [id number] Visit Date: 07/20/2018              Requested by: Audley Hose, MD [place], [place] [address] PCP: Audley Hose, MD   Assessment & Plan: Visit Diagnoses:  1. Primary osteoarthritis of right knee   2. Primary osteoarthritis of left knee     Plan: Osteoarthritis both knees.  We will start Orthovisc injections today and have her return weekly for the next 2 weeks to complete the series  Follow-Up Instructions: Return in about 1 week (around 07/27/2018).   Orders:  Orders Placed This Encounter  Procedures  . Large Joint Inj: bilateral knee   No orders of the defined types were placed in this encounter.     Procedures: Large Joint Inj: bilateral knee on 07/20/2018 4:01 PM Indications: pain and joint swelling Details: 25 G 1.5 in needle, anteromedial approach  Arthrogram: No  Medications (Right): 30 mg Hyaluronan 30 MG/2ML Medications (Left): 30 mg Hyaluronan 30 MG/2ML Outcome: tolerated well, no immediate complications Procedure, treatment alternatives, risks and benefits explained, specific risks discussed. Consent was given by the patient. Immediately prior to procedure a time out was called to verify the correct patient, procedure, equipment, support staff and site/side marked as required. Patient was prepped and draped in the usual sterile fashion.       Clinical Data: No additional findings.   Subjective: Chief Complaint  Patient presents with  . Left Knee - Follow-up    Orthovisc #1 07/20/18  . Right Knee - Follow-up    orthovisc #1 07/20/18  Patient presents today for her first orthovisc bilaterally. Patient is taking vicodin ofr pain.   HPI  Review of Systems   Objective: Vital Signs: BP 122/77   Pulse 79   Ht 5\' 1"  (1.549 m)   Wt 209 lb (94.8 kg)   BMI 39.49 kg/m   Physical Exam  Ortho Exam knees  were not hot red or swollen.  No effusion.  No instability.  Predominant medial joint pain  Specialty Comments:  No specialty comments available.  Imaging: No results found.   PMFS History: Patient Active Problem List   Diagnosis Date Noted  . COPD with acute exacerbation ([place]) 04/20/2018  . Anxiety and depression 04/20/2018  . CAP (community acquired pneumonia) 02/18/2017  . Liver cirrhosis ([place]) 02/18/2017  . Leukocytosis 02/18/2017  . Sinus tachycardia 02/18/2017  . Tachypnea 02/18/2017  . RLL pneumonia ([place]) 02/18/2017  . Increased ammonia level 02/18/2017  . Retained metal fragment left scapula 12/26/2015  . Fracture of glenoid process of left scapula 12/29/2014  . Status post total shoulder arthroplasty 12/27/2014  . Cocaine dependence in remission ([place]) 09/29/2014  . Severe heroin dependence in sustained remission ([place]) 09/29/2014  . Opiate abuse, episodic ([place]) 09/29/2014  . Mild tetrahydrocannabinol (THC) abuse 09/29/2014  . Chronic pain syndrome 04/18/2014  . Primary osteoarthritis of left shoulder 04/18/2014  . Primary osteoarthritis of right knee 04/18/2014  . Primary osteoarthritis of left knee 04/18/2014  . HYPERTENSION, BENIGN ESSENTIAL 03/22/2010  . CONSTIPATION 03/22/2010  . HEPATITIS C 06/04/1995   Past Medical History:  Diagnosis Date  . Anxiety   . Arthritis   . Constipation   . COPD (chronic obstructive pulmonary disease) ([place])   . DDD (degenerative disc  disease), cervical   . DDD (degenerative disc disease), lumbar   . Gout   . Heart murmur    probable bicuspid AV with mild AS, mild MR by 04/22/14 Echo (Dr. Montez Morita)  . Hepatitis C    treated 2016   . History of blood transfusion   . Hypertension   . Pneumonia 12/02/13    Family History  Problem Relation Age of Onset  . Heart disease Mother   . Kidney disease Mother   . Alcohol abuse Mother   . Breast cancer Cousin 46    Past Surgical History:  Procedure Laterality Date  . ABDOMINAL  HYSTERECTOMY    . APPENDECTOMY    . BACK SURGERY    . CESAREAN SECTION     x 3  . COLONOSCOPY W/ POLYPECTOMY    . HARDWARE REMOVAL Left 12/26/2015   Procedure: REMOVAL K-WIRE LEFT SCAPULA;  Surgeon: Garald Balding, MD;  Location: [facility];  Service: Orthopedics;  Laterality: Left;  . INNER EAR SURGERY     blood vessel  . TONSILLECTOMY    . TOTAL SHOULDER ARTHROPLASTY Left 12/27/2014   Procedure: TOTAL SHOULDER ARTHROPLASTY;  Surgeon: Garald Balding, MD;  Location: [facility];  Service: Orthopedics;  Laterality: Left;   Social History   Occupational History  . Occupation: retired  Tobacco Use  . Smoking status: Former Smoker    Packs/day: 1.00    Years: 49.00    Pack years: 49.00    Types: Cigarettes    Last attempt to quit: 2007    Years since quitting: 13.1  . Smokeless tobacco: Never Used  . Tobacco comment: quit 2007  Substance and Sexual Activity  . Alcohol use: No  . Drug use: No    Types: Marijuana    Comment: quit marijuana 12/18. last used heroin and cocaine in 1992.  Smokes Marijuana once a week., 12/22/15- "2 weeks ago"  . Sexual activity: Not on file

## 2018-07-27 ENCOUNTER — Other Ambulatory Visit: Payer: Self-pay | Admitting: Nurse Practitioner

## 2018-07-27 ENCOUNTER — Encounter (INDEPENDENT_AMBULATORY_CARE_PROVIDER_SITE_OTHER): Payer: Self-pay | Admitting: Orthopaedic Surgery

## 2018-07-27 ENCOUNTER — Ambulatory Visit (INDEPENDENT_AMBULATORY_CARE_PROVIDER_SITE_OTHER): Payer: Medicare Other | Admitting: Orthopaedic Surgery

## 2018-07-27 VITALS — BP 105/74 | HR 83 | Ht 61.0 in | Wt 199.0 lb

## 2018-07-27 DIAGNOSIS — M1712 Unilateral primary osteoarthritis, left knee: Secondary | ICD-10-CM

## 2018-07-27 DIAGNOSIS — M1711 Unilateral primary osteoarthritis, right knee: Secondary | ICD-10-CM | POA: Diagnosis not present

## 2018-07-27 DIAGNOSIS — K7469 Other cirrhosis of liver: Secondary | ICD-10-CM

## 2018-07-27 MED ORDER — HYALURONAN 30 MG/2ML IX SOSY
30.0000 mg | PREFILLED_SYRINGE | INTRA_ARTICULAR | Status: AC | PRN
  Administered 2018-07-27: 30 mg via INTRA_ARTICULAR

## 2018-07-27 NOTE — Progress Notes (Signed)
Office Visit Note   Patient: Ashley Riddle           Date of Birth: 1945-11-30           MRN: [id number] Visit Date: 07/27/2018              Requested by: Audley Hose, MD [place], [place] [address] PCP: Audley Hose, MD   Assessment & Plan: Visit Diagnoses:  1. Primary osteoarthritis of right knee   2. Primary osteoarthritis of left knee     Plan: Second Orthovisc injection both knees.  Return in 1 week to complete the series  Follow-Up Instructions: Return in about 1 week (around 08/03/2018).   Orders:  Orders Placed This Encounter  Procedures  . Large Joint Inj: bilateral knee   No orders of the defined types were placed in this encounter.     Procedures: Large Joint Inj: bilateral knee on 07/27/2018 4:40 PM Indications: pain and joint swelling Details: 25 G 1.5 in needle, anteromedial approach  Arthrogram: No  Medications (Right): 30 mg Hyaluronan 30 MG/2ML Medications (Left): 30 mg Hyaluronan 30 MG/2ML Outcome: tolerated well, no immediate complications Procedure, treatment alternatives, risks and benefits explained, specific risks discussed. Consent was given by the patient. Immediately prior to procedure a time out was called to verify the correct patient, procedure, equipment, support staff and site/side marked as required. Patient was prepped and draped in the usual sterile fashion.       Clinical Data: No additional findings.   Subjective: Chief Complaint  Patient presents with  . Left Knee - Follow-up    orthovisc started 07/20/18  . Right Knee - Follow-up    orthovisc started 07/20/18  Patient presents today for the second orthovisc injections bilaterally. She started the injections on 07/20/18. No change since last visit. She is taking hydrocodone for pain.   HPI  Review of Systems   Objective: Vital Signs: BP 105/74   Pulse 83   Ht 5\' 1"  (1.549 m)   Wt 199 lb (90.3 kg)   BMI 37.60 kg/m    Physical Exam  Ortho Exam both knees were not hot red warm or swollen  Specialty Comments:  No specialty comments available.  Imaging: No results found.   PMFS History: Patient Active Problem List   Diagnosis Date Noted  . COPD with acute exacerbation ([place]) 04/20/2018  . Anxiety and depression 04/20/2018  . CAP (community acquired pneumonia) 02/18/2017  . Liver cirrhosis ([place]) 02/18/2017  . Leukocytosis 02/18/2017  . Sinus tachycardia 02/18/2017  . Tachypnea 02/18/2017  . RLL pneumonia ([place]) 02/18/2017  . Increased ammonia level 02/18/2017  . Retained metal fragment left scapula 12/26/2015  . Fracture of glenoid process of left scapula 12/29/2014  . Status post total shoulder arthroplasty 12/27/2014  . Cocaine dependence in remission ([place]) 09/29/2014  . Severe heroin dependence in sustained remission ([place]) 09/29/2014  . Opiate abuse, episodic ([place]) 09/29/2014  . Mild tetrahydrocannabinol (THC) abuse 09/29/2014  . Chronic pain syndrome 04/18/2014  . Primary osteoarthritis of left shoulder 04/18/2014  . Primary osteoarthritis of right knee 04/18/2014  . Primary osteoarthritis of left knee 04/18/2014  . HYPERTENSION, BENIGN ESSENTIAL 03/22/2010  . CONSTIPATION 03/22/2010  . HEPATITIS C 06/04/1995   Past Medical History:  Diagnosis Date  . Anxiety   . Arthritis   . Constipation   . COPD (chronic obstructive pulmonary disease) ([facility])   . DDD (degenerative disc disease), cervical   . DDD (degenerative  disc disease), lumbar   . Gout   . Heart murmur    probable bicuspid AV with mild AS, mild MR by 04/22/14 Echo (Dr. Montez Morita)  . Hepatitis C    treated 2016   . History of blood transfusion   . Hypertension   . Pneumonia 12/02/13    Family History  Problem Relation Age of Onset  . Heart disease Mother   . Kidney disease Mother   . Alcohol abuse Mother   . Breast cancer Cousin 90    Past Surgical History:  Procedure Laterality Date  . ABDOMINAL HYSTERECTOMY    .  APPENDECTOMY    . BACK SURGERY    . CESAREAN SECTION     x 3  . COLONOSCOPY W/ POLYPECTOMY    . HARDWARE REMOVAL Left 12/26/2015   Procedure: REMOVAL K-WIRE LEFT SCAPULA;  Surgeon: Garald Balding, MD;  Location: [facility];  Service: Orthopedics;  Laterality: Left;  . INNER EAR SURGERY     blood vessel  . TONSILLECTOMY    . TOTAL SHOULDER ARTHROPLASTY Left 12/27/2014   Procedure: TOTAL SHOULDER ARTHROPLASTY;  Surgeon: Garald Balding, MD;  Location: [facility];  Service: Orthopedics;  Laterality: Left;   Social History   Occupational History  . Occupation: retired  Tobacco Use  . Smoking status: Former Smoker    Packs/day: 1.00    Years: 49.00    Pack years: 49.00    Types: Cigarettes    Last attempt to quit: 2007    Years since quitting: 13.1  . Smokeless tobacco: Never Used  . Tobacco comment: quit 2007  Substance and Sexual Activity  . Alcohol use: No  . Drug use: No    Types: Marijuana    Comment: quit marijuana 12/18. last used heroin and cocaine in 1992.  Smokes Marijuana once a week., 12/22/15- "2 weeks ago"  . Sexual activity: Not on file

## 2018-07-31 ENCOUNTER — Ambulatory Visit
Admission: RE | Admit: 2018-07-31 | Discharge: 2018-07-31 | Disposition: A | Discharge status: Home / Self Care | Payer: Medicare Other | Source: Ambulatory Visit | Attending: Nurse Practitioner | Admitting: Nurse Practitioner

## 2018-07-31 DIAGNOSIS — K7469 Other cirrhosis of liver: Secondary | ICD-10-CM

## 2018-08-04 ENCOUNTER — Ambulatory Visit (INDEPENDENT_AMBULATORY_CARE_PROVIDER_SITE_OTHER): Payer: Medicare HMO | Admitting: Orthopaedic Surgery

## 2018-08-04 ENCOUNTER — Encounter (INDEPENDENT_AMBULATORY_CARE_PROVIDER_SITE_OTHER): Payer: Self-pay | Admitting: Orthopaedic Surgery

## 2018-08-04 ENCOUNTER — Ambulatory Visit (INDEPENDENT_AMBULATORY_CARE_PROVIDER_SITE_OTHER): Payer: Medicare Other | Admitting: Orthopaedic Surgery

## 2018-08-04 VITALS — BP 125/82 | HR 109 | Ht 61.0 in | Wt 199.0 lb

## 2018-08-04 DIAGNOSIS — M1711 Unilateral primary osteoarthritis, right knee: Secondary | ICD-10-CM | POA: Diagnosis not present

## 2018-08-04 DIAGNOSIS — M25562 Pain in left knee: Secondary | ICD-10-CM

## 2018-08-04 DIAGNOSIS — G8929 Other chronic pain: Secondary | ICD-10-CM

## 2018-08-04 DIAGNOSIS — M1712 Unilateral primary osteoarthritis, left knee: Secondary | ICD-10-CM

## 2018-08-04 MED ORDER — METHYLPREDNISOLONE ACETATE 40 MG/ML IJ SUSP
80.0000 mg | INTRAMUSCULAR | Status: AC | PRN
  Administered 2018-08-04: 80 mg via INTRA_ARTICULAR

## 2018-08-04 MED ORDER — BUPIVACAINE HCL 0.5 % IJ SOLN
2.0000 mL | INTRAMUSCULAR | Status: AC | PRN
  Administered 2018-08-04: 2 mL via INTRA_ARTICULAR

## 2018-08-04 MED ORDER — LIDOCAINE HCL 1 % IJ SOLN
2.0000 mL | INTRAMUSCULAR | Status: AC | PRN
  Administered 2018-08-04: 2 mL

## 2018-08-04 NOTE — Progress Notes (Signed)
Office Visit Note   Patient: Ashley Riddle           Date of Birth: [birth year]           MRN: [id number] Visit Date: 08/04/2018              Requested by: Audley Hose, MD [place], [place] [address] PCP: Audley Hose, MD   Assessment & Plan: Visit Diagnoses:  1. Chronic pain of left knee   2. Primary osteoarthritis of right knee   3. Primary osteoarthritis of left knee     Plan: [name] has been receiving Orthovisc injections in both of her knees.  She has had a series of 2 and notes that since last week she has had increased pain more on the left than the right.  I aspirated 60 cc of fluid from her left knee and injected cortisone.  The fluid was clear.  Will send to the lab.  Check again in a week or 2 and consider doing the same for the right knee.  Appears that she has had a reaction to the Orthovisc  Follow-Up Instructions: Return in about 2 weeks (around 08/18/2018).   Orders:  Orders Placed This Encounter  Procedures  . Large Joint Inj: L knee  . Anaerobic and Aerobic Culture  . Protein, Synovial Fluid  . Synovial cell count + diff, w/ crystals   No orders of the defined types were placed in this encounter.     Procedures: Large Joint Inj: L knee on 08/04/2018 12:00 PM Indications: pain and diagnostic evaluation Details: 25 G 1.5 in needle, anteromedial approach  Arthrogram: No  Medications: 2 mL lidocaine 1 %; 2 mL bupivacaine 0.5 %; 80 mg methylPREDNISolone acetate 40 MG/ML Aspirate: 60 mL clear and yellow; sent for lab analysis Procedure, treatment alternatives, risks and benefits explained, specific risks discussed. Consent was given by the patient. Patient was prepped and draped in the usual sterile fashion.       Clinical Data: No additional findings.   Subjective: Chief Complaint  Patient presents with  . Left Knee - Follow-up  . Right Knee - Follow-up  Patient presents today for follow up on her  knees. She started orthovisc on 07/20/18. She is here for her third injections today but has been having increased pain in both knees since. She said both knees are very painful and has a diffcult time walking. She said that both knees are swollen. She is not taking anything for pain. She also states that her back has been hurting since the injections.  HPI  Review of Systems   Objective: Vital Signs: BP 125/82   Pulse (!) 109   Ht 5\' 1"  (1.549 m)   Wt 199 lb (90.3 kg)   BMI 37.60 kg/m   Physical Exam Constitutional:      Appearance: She is well-developed.  Eyes:     Pupils: Pupils are equal, round, and reactive to light.  Pulmonary:     Effort: Pulmonary effort is normal.  Skin:    General: Skin is warm and dry.  Neurological:     Mental Status: She is alert and oriented to person, place, and time.  Psychiatric:        Behavior: Behavior normal.     Ortho Exam awake alert and oriented x3.  Comfortable sitting.  Cried throughout the office visit as she has in the past.  Appears to have of effusion of both of  her knees.  Knees were large.  I did aspirate 60 cc of fluid from the left knee injected cortisone it was much better.  Sent the fluid to the lab.  Knee was minimally warm.  Right knee had a small effusion and was not nearly as uncomfortable. Specialty Comments:  No specialty comments available.  Imaging: No results found.   PMFS History: Patient Active Problem List   Diagnosis Date Noted  . COPD with acute exacerbation ([facility]) 04/20/2018  . Anxiety and depression 04/20/2018  . CAP (community acquired pneumonia) 02/18/2017  . Liver cirrhosis ([facility]) 02/18/2017  . Leukocytosis 02/18/2017  . Sinus tachycardia 02/18/2017  . Tachypnea 02/18/2017  . RLL pneumonia ([facility]) 02/18/2017  . Increased ammonia level 02/18/2017  . Retained metal fragment left scapula 12/26/2015  . Fracture of glenoid process of left scapula 12/29/2014  . Status post total shoulder arthroplasty  12/27/2014  . Cocaine dependence in remission ([place]) 09/29/2014  . Severe heroin dependence in sustained remission ([place]) 09/29/2014  . Opiate abuse, episodic ([place]) 09/29/2014  . Mild tetrahydrocannabinol (THC) abuse 09/29/2014  . Chronic pain syndrome 04/18/2014  . Primary osteoarthritis of left shoulder 04/18/2014  . Primary osteoarthritis of right knee 04/18/2014  . Primary osteoarthritis of left knee 04/18/2014  . HYPERTENSION, BENIGN ESSENTIAL 03/22/2010  . CONSTIPATION 03/22/2010  . HEPATITIS C 06/04/1995   Past Medical History:  Diagnosis Date  . Anxiety   . Arthritis   . Constipation   . COPD (chronic obstructive pulmonary disease) ([facility])   . DDD (degenerative disc disease), cervical   . DDD (degenerative disc disease), lumbar   . Gout   . Heart murmur    probable bicuspid AV with mild AS, mild MR by 04/22/14 Echo (Dr. Montez Morita)  . Hepatitis C    treated 2016   . History of blood transfusion   . Hypertension   . Pneumonia 12/02/13    Family History  Problem Relation Age of Onset  . Heart disease Mother   . Kidney disease Mother   . Alcohol abuse Mother   . Breast cancer Cousin 51    Past Surgical History:  Procedure Laterality Date  . ABDOMINAL HYSTERECTOMY    . APPENDECTOMY    . BACK SURGERY    . CESAREAN SECTION     x 3  . COLONOSCOPY W/ POLYPECTOMY    . HARDWARE REMOVAL Left 12/26/2015   Procedure: REMOVAL K-WIRE LEFT SCAPULA;  Surgeon: Garald Balding, MD;  Location: [facility];  Service: Orthopedics;  Laterality: Left;  . INNER EAR SURGERY     blood vessel  . TONSILLECTOMY    . TOTAL SHOULDER ARTHROPLASTY Left 12/27/2014   Procedure: TOTAL SHOULDER ARTHROPLASTY;  Surgeon: Garald Balding, MD;  Location: [facility];  Service: Orthopedics;  Laterality: Left;   Social History   Occupational History  . Occupation: retired  Tobacco Use  . Smoking status: Former Smoker    Packs/day: 1.00    Years: 49.00    Pack years: 49.00    Types: Cigarettes    Last attempt  to quit: 2007    Years since quitting: 13.1  . Smokeless tobacco: Never Used  . Tobacco comment: quit 2007  Substance and Sexual Activity  . Alcohol use: No  . Drug use: No    Types: Marijuana    Comment: quit marijuana 12/18. last used heroin and cocaine in 1992.  Smokes Marijuana once a week., 12/22/15- "2 weeks ago"  . Sexual activity: Not on file

## 2018-08-06 ENCOUNTER — Telehealth (HOSPITAL_COMMUNITY): Payer: Self-pay

## 2018-08-06 NOTE — Telephone Encounter (Signed)
Attempted to call pt in regards to pulmonary rehab referral. Spoke with daughter who states that pt is unavailable at the moment. Requested pt call back and phone number given. Daughter verbalized understanding.  Joycelyn Man RN, BSN Cardiac and Pulmonary Rehab RN

## 2018-08-10 LAB — ANAEROBIC AND AEROBIC CULTURE
AER RESULT: NO GROWTH
MICRO NUMBER: 275036
MICRO NUMBER:: 275035
SPECIMEN QUALITY:: ADEQUATE
SPECIMEN QUALITY:: ADEQUATE

## 2018-08-10 LAB — SYNOVIAL CELL COUNT + DIFF, W/ CRYSTALS
Basophils, %: 0 %
EOSINOPHILS-SYNOVIAL: 0 % (ref 0–2)
LYMPHOCYTES-SYNOVIAL FLD: 42 % (ref 0–74)
MONOCYTE/MACROPHAGE: 41 % (ref 0–69)
Neutrophil, Synovial: 12 % (ref 0–24)
Synoviocytes, %: 5 % (ref 0–15)
WBC, SYNOVIAL: 360 {cells}/uL — AB (ref <150)

## 2018-08-10 LAB — PROTEIN, SYNOVIAL FLUID: PROTEIN, TOTAL, SYNOVIAL FLUID: 3.6 g/dL — AB (ref 1.0–3.0)

## 2018-08-13 ENCOUNTER — Ambulatory Visit (INDEPENDENT_AMBULATORY_CARE_PROVIDER_SITE_OTHER): Payer: Medicare Other | Admitting: Orthopaedic Surgery

## 2018-08-14 ENCOUNTER — Encounter (INDEPENDENT_AMBULATORY_CARE_PROVIDER_SITE_OTHER): Payer: Self-pay | Admitting: Orthopaedic Surgery

## 2018-08-14 ENCOUNTER — Ambulatory Visit (INDEPENDENT_AMBULATORY_CARE_PROVIDER_SITE_OTHER): Payer: Medicare Other | Admitting: Orthopaedic Surgery

## 2018-08-14 ENCOUNTER — Other Ambulatory Visit: Payer: Self-pay

## 2018-08-14 VITALS — BP 138/72 | HR 81 | Ht 61.0 in | Wt 202.0 lb

## 2018-08-14 DIAGNOSIS — M25561 Pain in right knee: Secondary | ICD-10-CM | POA: Diagnosis not present

## 2018-08-14 DIAGNOSIS — G8929 Other chronic pain: Secondary | ICD-10-CM

## 2018-08-14 MED ORDER — METHYLPREDNISOLONE ACETATE 40 MG/ML IJ SUSP
80.0000 mg | INTRAMUSCULAR | Status: AC | PRN
  Administered 2018-08-14: 80 mg via INTRA_ARTICULAR

## 2018-08-14 MED ORDER — BUPIVACAINE HCL 0.5 % IJ SOLN
2.0000 mL | INTRAMUSCULAR | Status: AC | PRN
  Administered 2018-08-14: 2 mL via INTRA_ARTICULAR

## 2018-08-14 MED ORDER — LIDOCAINE HCL 1 % IJ SOLN
2.0000 mL | INTRAMUSCULAR | Status: AC | PRN
  Administered 2018-08-14: 2 mL

## 2018-08-14 NOTE — Progress Notes (Signed)
Office Visit Note   Patient: Ashley Riddle           Date of Birth: April 23, 1946           MRN: [id number] Visit Date: 08/14/2018              Requested by: Audley Hose, MD [place], [place] [address] PCP: Audley Hose, MD   Assessment & Plan: Visit Diagnoses:  1. Chronic pain of right knee     Plan: I aspirated the right knee without any fluid then injected cortisone.  No more Orthovisc as she seems to have had an aseptic reaction left knee is feeling much better  Follow-Up Instructions: Return if symptoms worsen or fail to improve.   Orders:  Orders Placed This Encounter  Procedures  . Large Joint Inj: R knee   No orders of the defined types were placed in this encounter.     Procedures: Large Joint Inj: R knee on 08/14/2018 10:50 AM Indications: pain and diagnostic evaluation Details: 25 G 1.5 in needle, anteromedial approach  Arthrogram: No  Medications: 2 mL lidocaine 1 %; 2 mL bupivacaine 0.5 %; 80 mg methylPREDNISolone acetate 40 MG/ML Aspirate: 0 mL Procedure, treatment alternatives, risks and benefits explained, specific risks discussed. Consent was given by the patient. Immediately prior to procedure a time out was called to verify the correct patient, procedure, equipment, support staff and site/side marked as required. Patient was prepped and draped in the usual sterile fashion.       Clinical Data: No additional findings.   Subjective: Chief Complaint  Patient presents with  . Left Knee - Follow-up  . Right Knee - Follow-up  Patient presents today for follow up on both knees. She had started orthovisc on 07/20/2018 and developed increased pain while doing those. She received a cortisone injection in her left knee last week. She said both knees are doing better, but still has some pain.  Particularly on the right  HPI  Review of Systems   Objective: Vital Signs: BP 138/72   Pulse 81   Ht 5\' 1"  (1.549  m)   Wt 202 lb (91.6 kg)   BMI 38.17 kg/m   Physical Exam Constitutional:      Appearance: She is well-developed.  Eyes:     Pupils: Pupils are equal, round, and reactive to light.  Pulmonary:     Effort: Pulmonary effort is normal.  Skin:    General: Skin is warm and dry.  Neurological:     Mental Status: She is alert and oriented to person, place, and time.  Psychiatric:        Behavior: Behavior normal.     Ortho Exam left knee was not hot red warm or swollen.  Very minimal discomfort.  No obvious effusion right knee.  It was also not hot red warm or swollen.  No significant joint pain  Specialty Comments:  No specialty comments available.  Imaging: No results found.   PMFS History: Patient Active Problem List   Diagnosis Date Noted  . COPD with acute exacerbation ([facility]) 04/20/2018  . Anxiety and depression 04/20/2018  . CAP (community acquired pneumonia) 02/18/2017  . Liver cirrhosis ([facility]) 02/18/2017  . Leukocytosis 02/18/2017  . Sinus tachycardia 02/18/2017  . Tachypnea 02/18/2017  . RLL pneumonia ([facility]) 02/18/2017  . Increased ammonia level 02/18/2017  . Retained metal fragment left scapula 12/26/2015  . Fracture of glenoid process of left scapula 12/29/2014  .  Status post total shoulder arthroplasty 12/27/2014  . Cocaine dependence in remission ([facility]) 09/29/2014  . Severe heroin dependence in sustained remission ([facility]) 09/29/2014  . Opiate abuse, episodic ([place]) 09/29/2014  . Mild tetrahydrocannabinol (THC) abuse 09/29/2014  . Chronic pain syndrome 04/18/2014  . Primary osteoarthritis of left shoulder 04/18/2014  . Primary osteoarthritis of right knee 04/18/2014  . Primary osteoarthritis of left knee 04/18/2014  . HYPERTENSION, BENIGN ESSENTIAL 03/22/2010  . CONSTIPATION 03/22/2010  . HEPATITIS C 06/04/1995   Past Medical History:  Diagnosis Date  . Anxiety   . Arthritis   . Constipation   . COPD (chronic obstructive pulmonary disease) ([facility])   . DDD  (degenerative disc disease), cervical   . DDD (degenerative disc disease), lumbar   . Gout   . Heart murmur    probable bicuspid AV with mild AS, mild MR by 04/22/14 Echo (Dr. Montez Morita)  . Hepatitis C    treated 2016   . History of blood transfusion   . Hypertension   . Pneumonia 12/02/13    Family History  Problem Relation Age of Onset  . Heart disease Mother   . Kidney disease Mother   . Alcohol abuse Mother   . Breast cancer Cousin 63    Past Surgical History:  Procedure Laterality Date  . ABDOMINAL HYSTERECTOMY    . APPENDECTOMY    . BACK SURGERY    . CESAREAN SECTION     x 3  . COLONOSCOPY W/ POLYPECTOMY    . HARDWARE REMOVAL Left 12/26/2015   Procedure: REMOVAL K-WIRE LEFT SCAPULA;  Surgeon: Garald Balding, MD;  Location: [facility];  Service: Orthopedics;  Laterality: Left;  . INNER EAR SURGERY     blood vessel  . TONSILLECTOMY    . TOTAL SHOULDER ARTHROPLASTY Left 12/27/2014   Procedure: TOTAL SHOULDER ARTHROPLASTY;  Surgeon: Garald Balding, MD;  Location: [facility];  Service: Orthopedics;  Laterality: Left;   Social History   Occupational History  . Occupation: retired  Tobacco Use  . Smoking status: Former Smoker    Packs/day: 1.00    Years: 49.00    Pack years: 49.00    Types: Cigarettes    Last attempt to quit: 2007    Years since quitting: 13.2  . Smokeless tobacco: Never Used  . Tobacco comment: quit 2007  Substance and Sexual Activity  . Alcohol use: No  . Drug use: No    Types: Marijuana    Comment: quit marijuana 12/18. last used heroin and cocaine in 1992.  Smokes Marijuana once a week., 12/22/15- "2 weeks ago"  . Sexual activity: Not on file

## 2018-08-28 ENCOUNTER — Telehealth (HOSPITAL_COMMUNITY): Payer: Self-pay

## 2018-08-28 NOTE — Telephone Encounter (Signed)
Called and spoke with pt daughter Mariann Laster and went over insurance, patient verbalized understanding.

## 2018-08-28 NOTE — Telephone Encounter (Signed)
Pt insurance is active and benefits verified through [facility]. Co-pay $0.00, DED $0.00/$0.00 met, out of pocket $6,700.00/$18.64 met, co-insurance 0%. No pre-authorization required. [place]/[employer] medicare, 08/28/2018 @ 1:21PM, REF# [id number]

## 2018-09-17 ENCOUNTER — Other Ambulatory Visit: Payer: Self-pay | Admitting: Internal Medicine

## 2018-09-17 DIAGNOSIS — Z1231 Encounter for screening mammogram for malignant neoplasm of breast: Secondary | ICD-10-CM

## 2018-09-23 ENCOUNTER — Other Ambulatory Visit: Payer: Self-pay | Admitting: Internal Medicine

## 2018-09-23 ENCOUNTER — Telehealth (HOSPITAL_COMMUNITY): Payer: Self-pay | Admitting: *Deleted

## 2018-09-23 DIAGNOSIS — M79661 Pain in right lower leg: Secondary | ICD-10-CM

## 2018-09-23 NOTE — Telephone Encounter (Signed)
Called and spoke to pt daughter Mariann Laster per pt request.  Advisement of Pulmonary  Rehab continued closure due to adherence of national recommendation in group setting. Pt daughter verbalized understanding.  Pt is not very active with exercise and was treated for sciatic nerve pain on yesterday by primary MD.  Will send pt daughter per request handouts on exercises and bands to use in the interim. Cherre Huger, BSN Cardiac and Training and development officer

## 2018-09-29 ENCOUNTER — Ambulatory Visit
Admission: RE | Admit: 2018-09-29 | Discharge: 2018-09-29 | Disposition: A | Discharge status: Home / Self Care | Payer: Medicare Other | Source: Ambulatory Visit | Attending: Internal Medicine | Admitting: Internal Medicine

## 2018-09-29 ENCOUNTER — Other Ambulatory Visit: Payer: Self-pay

## 2018-09-29 DIAGNOSIS — M79661 Pain in right lower leg: Secondary | ICD-10-CM

## 2018-10-02 ENCOUNTER — Other Ambulatory Visit: Payer: Self-pay

## 2018-10-02 ENCOUNTER — Emergency Department (HOSPITAL_COMMUNITY)
Admission: EM | Admit: 2018-10-02 | Discharge: 2018-10-02 | Disposition: A | Discharge status: Home / Self Care | Payer: Medicare Other | Attending: Emergency Medicine | Admitting: Emergency Medicine

## 2018-10-02 ENCOUNTER — Emergency Department (HOSPITAL_COMMUNITY): Payer: Medicare Other

## 2018-10-02 DIAGNOSIS — M25551 Pain in right hip: Secondary | ICD-10-CM

## 2018-10-02 DIAGNOSIS — I10 Essential (primary) hypertension: Secondary | ICD-10-CM | POA: Insufficient documentation

## 2018-10-02 DIAGNOSIS — J449 Chronic obstructive pulmonary disease, unspecified: Secondary | ICD-10-CM | POA: Insufficient documentation

## 2018-10-02 DIAGNOSIS — Z87891 Personal history of nicotine dependence: Secondary | ICD-10-CM | POA: Diagnosis not present

## 2018-10-02 MED ORDER — TIZANIDINE HCL 2 MG PO TABS: 2.0000 mg | ORAL_TABLET | Freq: Four times a day (QID) | ORAL | 0 refills | Status: DC | PRN

## 2018-10-02 MED ORDER — DICLOFENAC SODIUM 1 % TD GEL: 2.0000 g | Freq: Four times a day (QID) | TRANSDERMAL | 0 refills | Status: DC | PRN

## 2018-10-02 NOTE — Discharge Instructions (Signed)

## 2018-10-02 NOTE — ED Triage Notes (Signed)
Pt reports R hip pain starting last week that radiates down leg. Pt reports her PCP told her to come get an XR. Pt reports she is able to walk, but the pain gets worse with walking.

## 2018-10-02 NOTE — ED Provider Notes (Signed)
Emergency Department Provider Note   I have reviewed the triage vital signs and the nursing notes.   HISTORY  Chief Complaint Hip Pain   HPI Ashley Riddle is a 73 y.o. female with PMH of arthritis, anxiety, Gout, HTN, and COPD presents to the emergency department for evaluation of right hip pain.  Patient has had several weeks of symptoms.  She has been evaluated by her PCP.  She was initially given steroid medication which improved her symptoms slightly.  She feels like her right hip pain has worsened over the past several days.  She called her PCP today who referred her to the emergency department for x-ray of the hip.  She denies any injury.  She is not experiencing any fevers, chills, or redness.  She describes pain running from her lower back into her buttock and down her leg.  No prior history of sciatica.  She is not experiencing any lower extremity weakness, numbness, tingling.  No bowel or bladder incontinence symptoms.  Past Medical History:  Diagnosis Date   Anxiety    Arthritis    Constipation    COPD (chronic obstructive pulmonary disease) (HCC)    DDD (degenerative disc disease), cervical    DDD (degenerative disc disease), lumbar    Gout    Heart murmur    probable bicuspid AV with mild AS, mild MR by 04/22/14 Echo (Dr. Montez Morita)   Hepatitis C    treated 2016    History of blood transfusion    Hypertension    Pneumonia 12/02/13    Patient Active Problem List   Diagnosis Date Noted   COPD with acute exacerbation ([place]) 04/20/2018   Anxiety and depression 04/20/2018   CAP (community acquired pneumonia) 02/18/2017   Liver cirrhosis ([place]) 02/18/2017   Leukocytosis 02/18/2017   Sinus tachycardia 02/18/2017   Tachypnea 02/18/2017   RLL pneumonia ([place]) 02/18/2017   Increased ammonia level 02/18/2017   Retained metal fragment left scapula 12/26/2015   Fracture of glenoid process of left scapula 12/29/2014   Status post total shoulder  arthroplasty 12/27/2014   Cocaine dependence in remission ([place]) 09/29/2014   Severe heroin dependence in sustained remission ([place]) 09/29/2014   Opiate abuse, episodic ([place]) 09/29/2014   Mild tetrahydrocannabinol (THC) abuse 09/29/2014   Chronic pain syndrome 04/18/2014   Primary osteoarthritis of left shoulder 04/18/2014   Primary osteoarthritis of right knee 04/18/2014   Primary osteoarthritis of left knee 04/18/2014   HYPERTENSION, BENIGN ESSENTIAL 03/22/2010   CONSTIPATION 03/22/2010   HEPATITIS C 06/04/1995    Past Surgical History:  Procedure Laterality Date   ABDOMINAL HYSTERECTOMY     APPENDECTOMY     BACK SURGERY     CESAREAN SECTION     x 3   COLONOSCOPY W/ POLYPECTOMY     HARDWARE REMOVAL Left 12/26/2015   Procedure: REMOVAL K-WIRE LEFT SCAPULA;  Surgeon: Garald Balding, MD;  Location: [facility];  Service: Orthopedics;  Laterality: Left;   INNER EAR SURGERY     blood vessel   TONSILLECTOMY     TOTAL SHOULDER ARTHROPLASTY Left 12/27/2014   Procedure: TOTAL SHOULDER ARTHROPLASTY;  Surgeon: Garald Balding, MD;  Location: [facility];  Service: Orthopedics;  Laterality: Left;    Allergies Aspirin and Penicillins  Family History  Problem Relation Age of Onset   Heart disease Mother    Kidney disease Mother    Alcohol abuse Mother    Breast cancer Cousin 22    Social History Social History  Tobacco Use   Smoking status: Former Smoker    Packs/day: 1.00    Years: 49.00    Pack years: 49.00    Types: Cigarettes    Last attempt to quit: 2007    Years since quitting: 13.3   Smokeless tobacco: Never Used   Tobacco comment: quit 2007  Substance Use Topics   Alcohol use: No   Drug use: No    Types: Marijuana    Comment: quit marijuana 12/18. last used heroin and cocaine in 1992.  Smokes Marijuana once a week., 12/22/15- "2 weeks ago"    Review of Systems  Constitutional: No fever/chills Eyes: No visual changes. ENT: No sore  throat. Cardiovascular: Denies chest pain. Respiratory: Denies shortness of breath. Gastrointestinal: No abdominal pain.  No nausea, no vomiting.  No diarrhea.  No constipation. Genitourinary: Negative for dysuria. Musculoskeletal: Positive lower back and right hip pain.  Skin: Negative for rash. Neurological: Negative for headaches, focal weakness or numbness.  10-point ROS otherwise negative.  ____________________________________________   PHYSICAL EXAM:  VITAL SIGNS: ED Triage Vitals  Enc Vitals Group     BP 10/02/18 1608 (!) 144/87     Pulse Rate 10/02/18 1608 96     Resp 10/02/18 1608 18     Temp 10/02/18 1608 98 F (36.7 C)     Temp Source 10/02/18 1608 Oral     SpO2 10/02/18 1608 96 %     Weight 10/02/18 1609 204 lb (92.5 kg)     Height 10/02/18 1609 5\' 1"  (1.549 m)     Pain Score 10/02/18 1609 10   Constitutional: Alert and oriented. Well appearing and in no acute distress. Eyes: Conjunctivae are normal. Head: Atraumatic. Nose: No congestion/rhinnorhea. Mouth/Throat: Mucous membranes are moist.  Neck: No stridor.   Cardiovascular: Good peripheral circulation. Respiratory: Normal respiratory effort.   Gastrointestinal: No distention.  Musculoskeletal: No lower extremity tenderness nor edema. No gross deformities of extremities. Normal ROM of the right hip and knee.  Neurologic:  Normal speech and language. No gross focal neurologic deficits are appreciated. Normal strength and sensation in the bilateral LEs.  Skin:  Skin is warm, dry and intact. No rash noted.  ____________________________________________  RADIOLOGY  Dg Hip Unilat  With Pelvis 2-3 Views Right  Result Date: 10/02/2018 CLINICAL DATA:  Right hip pain. EXAM: DG HIP (WITH OR WITHOUT PELVIS) 2-3V RIGHT COMPARISON:  None. FINDINGS: Mild degenerative change of the hips right worse than left. No evidence of acute fracture or dislocation. Mild degenerate change of the spine. Several pelvic phleboliths are  present. IMPRESSION: No acute findings. Mild degenerative change of the hips right worse than left. Electronically Signed   By: Marin Olp M.D.   On: 10/02/2018 16:36    ____________________________________________   PROCEDURES  Procedure(s) performed:   Procedures  None  ____________________________________________   INITIAL IMPRESSION / ASSESSMENT AND PLAN / ED COURSE  Pertinent labs & imaging results that were available during my care of the patient were reviewed by me and considered in my medical decision making (see chart for details).   Patient presents to the emergency department with right hip pain over the past week.  She had right lower extremity ultrasound performed as an outpatient to rule out DVT which she tells me was negative.  She has no injury.  No symptoms or signs on exam to suspect septic hip.  Her symptoms are most consistent with sciatica.  She has no findings on exam or historical features to suspect  spine emergency.  No additional imaging at this time. Discussed home mgmt plan, PCP follow up by phone next week, and ED return precautions.  ____________________________________________  FINAL CLINICAL IMPRESSION(S) / ED DIAGNOSES  Final diagnoses:  Right hip pain    NEW OUTPATIENT MEDICATIONS STARTED DURING THIS VISIT:  Discharge Medication List as of 10/02/2018  5:30 PM    START taking these medications   Details  diclofenac sodium (VOLTAREN) 1 % GEL Apply 2 g topically 4 (four) times daily as needed (lower back pain)., Starting Fri 10/02/2018, Print    tiZANidine (ZANAFLEX) 2 MG tablet Take 1 tablet (2 mg total) by mouth every 6 (six) hours as needed for muscle spasms., Starting Fri 10/02/2018, Print        Note:  This document was prepared using Dragon voice recognition software and may include unintentional dictation errors.  Nanda Quinton, MD Emergency Medicine    Matsue Strom, [facility], MD 10/02/18 2055

## 2018-10-02 NOTE — ED Notes (Signed)
Patient verbalizes understanding of discharge instructions. Opportunity for questioning and answers were provided. Armband removed by staff, pt discharged from ED ambulatory w/ daughter  

## 2018-10-06 ENCOUNTER — Ambulatory Visit: Payer: Self-pay

## 2018-10-06 ENCOUNTER — Other Ambulatory Visit: Payer: Self-pay

## 2018-10-06 ENCOUNTER — Encounter: Payer: Self-pay | Admitting: Orthopaedic Surgery

## 2018-10-06 ENCOUNTER — Ambulatory Visit (INDEPENDENT_AMBULATORY_CARE_PROVIDER_SITE_OTHER): Payer: Medicare Other | Admitting: Orthopaedic Surgery

## 2018-10-06 VITALS — BP 131/94 | HR 111 | Ht 62.0 in | Wt 204.0 lb

## 2018-10-06 DIAGNOSIS — M545 Low back pain, unspecified: Secondary | ICD-10-CM

## 2018-10-06 NOTE — Progress Notes (Signed)
Office Visit Note   Patient: Ashley Riddle           Date of Birth: Jul 21, 1945           MRN: [id number] Visit Date: 10/06/2018              Requested by: Audley Hose, MD [place], [place] [address] PCP: Audley Hose, MD   Assessment & Plan: Visit Diagnoses:  1. Acute right-sided low back pain, unspecified whether sciatica present     Plan: Low back pain with questionable radiculopathy.  Appears to be related to the degenerative arthrosis at L5-S1.  Will apply lumbar support and follow-up in several weeks.  Has muscle relaxant at home  Follow-Up Instructions: Return if symptoms worsen or fail to improve.   Orders:  Orders Placed This Encounter  Procedures  . XR Lumbar Spine 2-3 Views   No orders of the defined types were placed in this encounter.     Procedures: No procedures performed   Clinical Data: No additional findings.   Subjective: Chief Complaint  Patient presents with  . Right Hip - Pain  Patient presents today with right hip pain. No known injury. She said that it has been hurting for a week. She said that the pain started in her toes and has moved to her hip, and now radiates into her lower back. She went to [facility] ED on 10/02/18 and had right hip x-rays. She is not taking anything for pain, because she ran out of hydrocodone.  Films of her pelvis were reviewed on the PACS system.  There is no evidence of any acute change.  Very minimal degenerative changes of both hips  HPI  Review of Systems  Constitutional: Positive for fatigue.  HENT: Negative for ear pain.   Eyes: Negative for pain.  Respiratory: Positive for shortness of breath.   Cardiovascular: Positive for leg swelling.  Gastrointestinal: Negative for constipation and diarrhea.  Endocrine: Negative for cold intolerance and heat intolerance.  Genitourinary: Negative for difficulty urinating.  Musculoskeletal: Negative for joint swelling.  Skin:  Negative for rash.  Allergic/Immunologic: Negative for food allergies.  Neurological: Negative for weakness.  Hematological: Does not bruise/bleed easily.  Psychiatric/Behavioral: Negative for sleep disturbance.     Objective: Vital Signs: BP (!) 131/94   Pulse (!) 111   Ht 5\' 2"  (1.575 m)   Wt 204 lb (92.5 kg)   BMI 37.31 kg/m   Physical Exam Constitutional:      Appearance: She is well-developed.  Eyes:     Pupils: Pupils are equal, round, and reactive to light.  Pulmonary:     Effort: Pulmonary effort is normal.  Skin:    General: Skin is warm and dry.  Neurological:     Mental Status: She is alert and oriented to person, place, and time.  Psychiatric:        Behavior: Behavior normal.     Ortho Exam awake alert and oriented x3.  Comfortable sitting.  Has a short based gait with ambulation but did not have a significant limp.  Straight leg raise negative.  Motor exam appears to be intact.  Painless range of motion both hips.  Does have percussible tenderness of the lower lumbar spine.  No pain over the sacroiliac joints  Specialty Comments:  No specialty comments available.  Imaging: Xr Lumbar Spine 2-3 Views  Result Date: 10/06/2018 Films lumbar spine obtained in 2 projections.  There is a  very minimal anterior listhesis of L4 on 5.  There is significant decrease in the disc space height between L5 and S1 with anterior posterior osteophytes.  Mild calcification of the abdominal aorta.  No acute changes or evidence of a fracture.  Normal alignment    PMFS History: Patient Active Problem List   Diagnosis Date Noted  . Acute right-sided low back pain 10/06/2018  . COPD with acute exacerbation ([place]) 04/20/2018  . Anxiety and depression 04/20/2018  . CAP (community acquired pneumonia) 02/18/2017  . Liver cirrhosis ([place]) 02/18/2017  . Leukocytosis 02/18/2017  . Sinus tachycardia 02/18/2017  . Tachypnea 02/18/2017  . RLL pneumonia ([place]) 02/18/2017  . Increased  ammonia level 02/18/2017  . Retained metal fragment left scapula 12/26/2015  . Fracture of glenoid process of left scapula 12/29/2014  . Status post total shoulder arthroplasty 12/27/2014  . Cocaine dependence in remission ([place]) 09/29/2014  . Severe heroin dependence in sustained remission ([place]) 09/29/2014  . Opiate abuse, episodic ([place]) 09/29/2014  . Mild tetrahydrocannabinol (THC) abuse 09/29/2014  . Chronic pain syndrome 04/18/2014  . Primary osteoarthritis of left shoulder 04/18/2014  . Primary osteoarthritis of right knee 04/18/2014  . Primary osteoarthritis of left knee 04/18/2014  . HYPERTENSION, BENIGN ESSENTIAL 03/22/2010  . CONSTIPATION 03/22/2010  . HEPATITIS C 06/04/1995   Past Medical History:  Diagnosis Date  . Anxiety   . Arthritis   . Constipation   . COPD (chronic obstructive pulmonary disease) ([place])   . DDD (degenerative disc disease), cervical   . DDD (degenerative disc disease), lumbar   . Gout   . Heart murmur    probable bicuspid AV with mild AS, mild MR by 04/22/14 Echo (Dr. Montez Morita)  . Hepatitis C    treated 2016   . History of blood transfusion   . Hypertension   . Pneumonia 12/02/13    Family History  Problem Relation Age of Onset  . Heart disease Mother   . Kidney disease Mother   . Alcohol abuse Mother   . Breast cancer Cousin 15    Past Surgical History:  Procedure Laterality Date  . ABDOMINAL HYSTERECTOMY    . APPENDECTOMY    . BACK SURGERY    . CESAREAN SECTION     x 3  . COLONOSCOPY W/ POLYPECTOMY    . HARDWARE REMOVAL Left 12/26/2015   Procedure: REMOVAL K-WIRE LEFT SCAPULA;  Surgeon: Garald Balding, MD;  Location: [facility];  Service: Orthopedics;  Laterality: Left;  . INNER EAR SURGERY     blood vessel  . TONSILLECTOMY    . TOTAL SHOULDER ARTHROPLASTY Left 12/27/2014   Procedure: TOTAL SHOULDER ARTHROPLASTY;  Surgeon: Garald Balding, MD;  Location: [facility];  Service: Orthopedics;  Laterality: Left;   Social History    Occupational History  . Occupation: retired  Tobacco Use  . Smoking status: Former Smoker    Packs/day: 1.00    Years: 49.00    Pack years: 49.00    Types: Cigarettes    Last attempt to quit: 2007    Years since quitting: 13.3  . Smokeless tobacco: Never Used  . Tobacco comment: quit 2007  Substance and Sexual Activity  . Alcohol use: No  . Drug use: No    Types: Marijuana    Comment: quit marijuana 12/18. last used heroin and cocaine in 1992.  Smokes Marijuana once a week., 12/22/15- "2 weeks ago"  . Sexual activity: Not on file

## 2018-10-14 ENCOUNTER — Other Ambulatory Visit: Payer: Self-pay | Admitting: Internal Medicine

## 2018-10-14 DIAGNOSIS — M5441 Lumbago with sciatica, right side: Secondary | ICD-10-CM

## 2018-10-22 ENCOUNTER — Ambulatory Visit (INDEPENDENT_AMBULATORY_CARE_PROVIDER_SITE_OTHER): Payer: Medicare Other | Admitting: Orthopedic Surgery

## 2018-10-22 ENCOUNTER — Other Ambulatory Visit: Payer: Self-pay

## 2018-10-22 ENCOUNTER — Encounter: Payer: Self-pay | Admitting: Orthopedic Surgery

## 2018-10-22 VITALS — BP 139/77 | HR 91 | Resp 16 | Ht 61.0 in | Wt 204.0 lb

## 2018-10-22 DIAGNOSIS — M1712 Unilateral primary osteoarthritis, left knee: Secondary | ICD-10-CM

## 2018-10-22 MED ORDER — LIDOCAINE HCL 1 % IJ SOLN
2.0000 mL | INTRAMUSCULAR | Status: AC | PRN
  Administered 2018-10-22: 2 mL

## 2018-10-22 MED ORDER — BUPIVACAINE HCL 0.25 % IJ SOLN
2.0000 mL | INTRAMUSCULAR | Status: AC | PRN
  Administered 2018-10-22: 12:00:00 2 mL via INTRA_ARTICULAR

## 2018-10-22 MED ORDER — METHYLPREDNISOLONE ACETATE 40 MG/ML IJ SUSP
80.0000 mg | INTRAMUSCULAR | Status: AC | PRN
  Administered 2018-10-22: 80 mg via INTRA_ARTICULAR

## 2018-10-22 NOTE — Progress Notes (Signed)
Office Visit Note   Patient: Ashley Riddle           Date of Birth: April 30, 1946           MRN: [id number] Visit Date: 10/22/2018              Requested by: Audley Hose, MD [place], [place] [address] PCP: Audley Hose, MD   Assessment & Plan: Visit Diagnoses:  1. Unilateral primary osteoarthritis, left knee     Plan:  #1: The left knee was injected with corticosteroid. #2: Follow back up for injection to the right knee in 1 week  Follow-Up Instructions: Return in about 1 week (around 10/29/2018).   Orders:  Orders Placed This Encounter  Procedures  . Large Joint Inj   No orders of the defined types were placed in this encounter.     Procedures: Large Joint Inj: L knee on 10/22/2018 11:44 AM Indications: pain and diagnostic evaluation Details: 25 G 1.5 in needle, anteromedial approach  Arthrogram: No  Medications: 2 mL lidocaine 1 %; 80 mg methylPREDNISolone acetate 40 MG/ML; 2 mL bupivacaine 0.25 % Outcome: tolerated well, no immediate complications Procedure, treatment alternatives, risks and benefits explained, specific risks discussed. Consent was given by the patient. Immediately prior to procedure a time out was called to verify the correct patient, procedure, equipment, support staff and site/side marked as required. Patient was prepped and draped in the usual sterile fashion.       Clinical Data: No additional findings.   Subjective: Chief Complaint  Patient presents with  . Left Knee - Pain  . Right Knee - Pain   HPI Ashley Riddle is a 73 year old female who presents with bilateral knee pain x 1 month off and on. She has not had an injury. She is having difficulty walking, difficulty sleeping at night, swelling, popping, clicking and grinding noises. She takes percocet which helps at times. She uses heating pad and ice which helps.She has never had knee surgery to either knee. She is not diabetic.  She has had  corticosteroid injections to the knees about 3 months ago.  Worsening symptoms.   Review of Systems  Constitutional: Negative for fatigue.  HENT: Negative for trouble swallowing.   Eyes: Negative for pain.  Respiratory: Positive for shortness of breath.   Cardiovascular: Positive for leg swelling.  Gastrointestinal: Negative for constipation.  Endocrine: Negative for cold intolerance.  Genitourinary: Negative for difficulty urinating.  Musculoskeletal: Positive for gait problem and joint swelling.  Skin: Negative for rash.  Allergic/Immunologic: Negative for food allergies.  Neurological: Positive for weakness.  Hematological: Does not bruise/bleed easily.  Psychiatric/Behavioral: Positive for sleep disturbance.     Objective: Vital Signs: BP 139/77 (BP Location: Right Arm, Patient Position: Sitting, Cuff Size: Normal)   Pulse 91   Resp 16   Ht 5\' 1"  (1.549 m)   Wt 204 lb (92.5 kg)   BMI 38.55 kg/m   Physical Exam Constitutional:      Appearance: She is well-developed.  Eyes:     Pupils: Pupils are equal, round, and reactive to light.  Pulmonary:     Effort: Pulmonary effort is normal.  Skin:    General: Skin is warm and dry.  Neurological:     Mental Status: She is alert and oriented to person, place, and time.  Psychiatric:        Behavior: Behavior normal.     Ortho Exam  Exam today  reveals the left knee to have mild fusion.  No warmth or erythema.  Crepitance with range of motion. Specialty Comments:  No specialty comments available.  Imaging: No results found.   PMFS History: Current Outpatient Medications  Medication Sig Dispense Refill  . albuterol (PROVENTIL HFA;VENTOLIN HFA) 108 (90 Base) MCG/ACT inhaler Inhale 2 puffs into the lungs every 2 (two) hours as needed for wheezing or shortness of breath.     . budesonide (PULMICORT) 0.5 MG/2ML nebulizer solution Take 2 mLs (0.5 mg total) by nebulization 2 (two) times daily. 120 mL 0  .  clotrimazole-betamethasone (LOTRISONE) cream Apply 1 application topically 2 (two) times daily as needed (rash under breasts).   1  . colchicine 0.6 MG tablet Take 0.6 mg by mouth daily as needed (gout flares). Col[place]rys    . Cyanocobalamin (VITAMIN B-12 PO) Take 1 tablet by mouth daily.    . diclofenac sodium (VOLTAREN) 1 % GEL Apply 2 g topically 4 (four) times daily as needed (lower back pain). 1 Tube 0  . HYDROcodone-acetaminophen (NORCO/VICODIN) 5-325 MG tablet Take 1-2 tablets by mouth every 4 (four) hours as needed. (Patient taking differently: Take 1 tablet by mouth 4 (four) times daily as needed (pain). ) 12 tablet 0  . losartan-hydrochlorothiazide (HYZAAR) 100-25 MG tablet Take 1 tablet by mouth at bedtime.   3  . Multiple Vitamin (MULTIVITAMIN WITH MINERALS) TABS tablet Take 1 tablet by mouth daily.    Marland Kitchen oxyCODONE-acetaminophen (PERCOCET/ROXICET) 5-325 MG tablet TAKE 1 TABLET BY MOUTH EVERY 6 HOURS AS NEEDED FOR 5 DAYS    . sertraline (ZOLOFT) 50 MG tablet Patient states she only takes 25 mg    . tiotropium (SPIRIVA HANDIHALER) 18 MCG inhalation capsule Place 18 mcg into inhaler and inhale daily as needed (shortness of breath).     Marland Kitchen tiZANidine (ZANAFLEX) 2 MG tablet Take 1 tablet (2 mg total) by mouth every 6 (six) hours as needed for muscle spasms. 15 tablet 0  . DULoxetine (CYMBALTA) 30 MG capsule Take 1 capsule (30 mg total) by mouth daily. (Patient not taking: Reported on 04/20/2018) 30 capsule 1  . sertraline (ZOLOFT) 100 MG tablet Take 50 mg by mouth daily as needed (depression).      No current facility-administered medications for this visit.     Patient Active Problem List   Diagnosis Date Noted  . Acute right-sided low back pain 10/06/2018  . COPD with acute exacerbation ([place]) 04/20/2018  . Anxiety and depression 04/20/2018  . CAP (community acquired pneumonia) 02/18/2017  . Liver cirrhosis ([place]) 02/18/2017  . Leukocytosis 02/18/2017  . Sinus tachycardia 02/18/2017  .  Tachypnea 02/18/2017  . RLL pneumonia ([place]) 02/18/2017  . Increased ammonia level 02/18/2017  . Retained metal fragment left scapula 12/26/2015  . Fracture of glenoid process of left scapula 12/29/2014  . Status post total shoulder arthroplasty 12/27/2014  . Cocaine dependence in remission ([place]) 09/29/2014  . Severe heroin dependence in sustained remission ([place]) 09/29/2014  . Opiate abuse, episodic ([place]) 09/29/2014  . Mild tetrahydrocannabinol (THC) abuse 09/29/2014  . Chronic pain syndrome 04/18/2014  . Primary osteoarthritis of left shoulder 04/18/2014  . Primary osteoarthritis of right knee 04/18/2014  . Primary osteoarthritis of left knee 04/18/2014  . HYPERTENSION, BENIGN ESSENTIAL 03/22/2010  . CONSTIPATION 03/22/2010  . HEPATITIS C 06/04/1995   Past Medical History:  Diagnosis Date  . Anxiety   . Arthritis   . Constipation   . COPD (chronic obstructive pulmonary disease) ([place])   . DDD (  degenerative disc disease), cervical   . DDD (degenerative disc disease), lumbar   . Gout   . Heart murmur    probable bicuspid AV with mild AS, mild MR by 04/22/14 Echo (Dr. Montez Morita)  . Hepatitis C    treated 2016   . History of blood transfusion   . Hypertension   . Pneumonia 12/02/13    Family History  Problem Relation Age of Onset  . Heart disease Mother   . Kidney disease Mother   . Alcohol abuse Mother   . Breast cancer Cousin 83    Past Surgical History:  Procedure Laterality Date  . ABDOMINAL HYSTERECTOMY    . APPENDECTOMY    . BACK SURGERY    . CESAREAN SECTION     x 3  . COLONOSCOPY W/ POLYPECTOMY    . HARDWARE REMOVAL Left 12/26/2015   Procedure: REMOVAL K-WIRE LEFT SCAPULA;  Surgeon: Garald Balding, MD;  Location: [facility];  Service: Orthopedics;  Laterality: Left;  . INNER EAR SURGERY     blood vessel  . TONSILLECTOMY    . TOTAL SHOULDER ARTHROPLASTY Left 12/27/2014   Procedure: TOTAL SHOULDER ARTHROPLASTY;  Surgeon: Garald Balding, MD;  Location: [facility];   Service: Orthopedics;  Laterality: Left;   Social History   Occupational History  . Occupation: retired  Tobacco Use  . Smoking status: Former Smoker    Packs/day: 1.00    Years: 49.00    Pack years: 49.00    Types: Cigarettes    Last attempt to quit: 2007    Years since quitting: 13.3  . Smokeless tobacco: Never Used  . Tobacco comment: quit 2007  Substance and Sexual Activity  . Alcohol use: No  . Drug use: No    Types: Marijuana    Comment: quit marijuana 12/18. last used heroin and cocaine in 1992.  Smokes Marijuana once a week., 12/22/15- "2 weeks ago"  . Sexual activity: Not on file

## 2018-10-24 ENCOUNTER — Ambulatory Visit
Admission: RE | Admit: 2018-10-24 | Discharge: 2018-10-24 | Disposition: A | Discharge status: Home / Self Care | Payer: Medicare Other | Source: Ambulatory Visit | Attending: Internal Medicine | Admitting: Internal Medicine

## 2018-10-24 ENCOUNTER — Other Ambulatory Visit: Payer: Self-pay

## 2018-10-24 DIAGNOSIS — M5441 Lumbago with sciatica, right side: Secondary | ICD-10-CM

## 2018-10-24 MED ORDER — GADOBENATE DIMEGLUMINE 529 MG/ML IV SOLN
19.0000 mL | Freq: Once | INTRAVENOUS | Status: AC | PRN
  Administered 2018-10-24: 19 mL via INTRAVENOUS

## 2018-10-27 ENCOUNTER — Telehealth: Payer: Self-pay | Admitting: Orthopaedic Surgery

## 2018-10-27 ENCOUNTER — Other Ambulatory Visit: Payer: Self-pay | Admitting: Orthopaedic Surgery

## 2018-10-27 DIAGNOSIS — M545 Low back pain, unspecified: Secondary | ICD-10-CM

## 2018-10-27 NOTE — Telephone Encounter (Signed)
Spoke with patient. I explained that Dr.Whitfield would like her to see Dr.Newton about possibly getting an ESI. Patient is aware that someone will call to schedule her.

## 2018-10-27 NOTE — Telephone Encounter (Signed)
Consult Dr Ernestina Patches for an [facility]

## 2018-10-27 NOTE — Telephone Encounter (Signed)
Please advise 

## 2018-10-27 NOTE — Telephone Encounter (Signed)
Patient's daughter Ashley Riddle called stating Ashley Riddle's PCP Dr. Maia Petties ordered an MRI which she had on 10/24/18.  Ashley Riddle states Dr. Maia Petties sent results to Dr. Durward Fortes for his review.  Ashley Riddle states her mom is in a lot of pain and is requesting a return call.

## 2018-10-29 ENCOUNTER — Telehealth: Payer: Self-pay | Admitting: Orthopaedic Surgery

## 2018-10-29 NOTE — Telephone Encounter (Signed)
Referral for Dr.Newton sent yesterday. Thanks!

## 2018-10-29 NOTE — Telephone Encounter (Signed)
Patient's daughter Mariann Laster called stating she has been trying to contact Dr. Ernestina Patches to schedule an appointment, but has not received a return call.  Mariann Laster states her mom is "in a lot of pain" and is requesting a return call ASAP.  Mariann Laster is requesting to be called at [phone]

## 2018-10-29 NOTE — Telephone Encounter (Signed)
I called Mariann Laster.

## 2018-11-03 ENCOUNTER — Ambulatory Visit (INDEPENDENT_AMBULATORY_CARE_PROVIDER_SITE_OTHER): Payer: Medicare Other | Admitting: Orthopaedic Surgery

## 2018-11-03 ENCOUNTER — Encounter: Payer: Self-pay | Admitting: Orthopaedic Surgery

## 2018-11-03 ENCOUNTER — Other Ambulatory Visit: Payer: Self-pay

## 2018-11-03 VITALS — BP 104/70 | HR 105 | Ht 61.0 in | Wt 204.0 lb

## 2018-11-03 DIAGNOSIS — M1711 Unilateral primary osteoarthritis, right knee: Secondary | ICD-10-CM | POA: Diagnosis not present

## 2018-11-03 MED ORDER — BUPIVACAINE HCL 0.5 % IJ SOLN
2.0000 mL | INTRAMUSCULAR | Status: AC | PRN
  Administered 2018-11-03: 2 mL via INTRA_ARTICULAR

## 2018-11-03 MED ORDER — LIDOCAINE HCL 1 % IJ SOLN
2.0000 mL | INTRAMUSCULAR | Status: AC | PRN
  Administered 2018-11-03: 2 mL

## 2018-11-03 MED ORDER — METHYLPREDNISOLONE ACETATE 40 MG/ML IJ SUSP
80.0000 mg | INTRAMUSCULAR | Status: AC | PRN
  Administered 2018-11-03: 80 mg via INTRA_ARTICULAR

## 2018-11-03 NOTE — Progress Notes (Signed)
Office Visit Note   Patient: Ashley Riddle           Date of Birth: May 21, 1946           MRN: [id number] Visit Date: 11/03/2018              Requested by: Audley Hose, MD [place], [place] [address] PCP: Audley Hose, MD   Assessment & Plan: Visit Diagnoses:  1. Primary osteoarthritis of right knee     Plan: Primary osteoarthritis right knee.  Has had good results with cortisone in the past.  Will inject cortisone today and monitor response  Follow-Up Instructions: No follow-ups on file.   Orders:  Orders Placed This Encounter  Procedures  . Large Joint Inj: R knee   No orders of the defined types were placed in this encounter.     Procedures: Large Joint Inj: R knee on 11/03/2018 10:31 AM Indications: pain and diagnostic evaluation Details: 25 G 1.5 in needle, anteromedial approach  Arthrogram: No  Medications: 2 mL lidocaine 1 %; 2 mL bupivacaine 0.5 %; 80 mg methylPREDNISolone acetate 40 MG/ML Procedure, treatment alternatives, risks and benefits explained, specific risks discussed. Consent was given by the patient. Immediately prior to procedure a time out was called to verify the correct patient, procedure, equipment, support staff and site/side marked as required. Patient was prepped and draped in the usual sterile fashion.       Clinical Data: No additional findings.   Subjective: Chief Complaint  Patient presents with  . Left Knee - Follow-up  Patient presents today for left knee arthritis follow up. She was here 12 days ago and received a cortisone injection. Patient states that her left knee is doing better. She is wanting a cortisone injection in the right knee today. She had a cortisone injection in the right knee on 08/14/2018.  Had films of the right knee 1 year ago demonstrating advanced arthritis.  Recently had a cortisone injection left knee with good response  HPI  Review of Systems  Constitutional:  Negative for fatigue.  HENT: Negative for ear pain.   Eyes: Negative for redness.  Respiratory: Negative for shortness of breath.   Cardiovascular: Negative for leg swelling.  Gastrointestinal: Negative for constipation and diarrhea.  Endocrine: Negative for cold intolerance and heat intolerance.  Genitourinary: Negative for difficulty urinating.  Musculoskeletal: Positive for joint swelling.  Skin: Negative for rash.  Allergic/Immunologic: Negative for food allergies.  Neurological: Negative for weakness.  Hematological: Does not bruise/bleed easily.  Psychiatric/Behavioral: Positive for sleep disturbance.     Objective: Vital Signs: BP 104/70   Pulse (!) 105   Ht 5\' 1"  (1.549 m)   Wt 204 lb (92.5 kg)   BMI 38.55 kg/m   Physical Exam Constitutional:      Appearance: She is well-developed.  Eyes:     Pupils: Pupils are equal, round, and reactive to light.  Pulmonary:     Effort: Pulmonary effort is normal.  Skin:    General: Skin is warm and dry.  Neurological:     Mental Status: She is alert and oriented to person, place, and time.  Psychiatric:        Behavior: Behavior normal.     Ortho Exam awake alert and oriented x3.  Comfortable sitting.  Small effusion right knee.  No instability.  Having some medial and lateral joint pain.  Full extension and flexed about 100 degrees without instability.  No calf  pain.  Specialty Comments:  No specialty comments available.  Imaging: No results found.   PMFS History: Patient Active Problem List   Diagnosis Date Noted  . Acute right-sided low back pain 10/06/2018  . COPD with acute exacerbation ([facility]) 04/20/2018  . Anxiety and depression 04/20/2018  . CAP (community acquired pneumonia) 02/18/2017  . Liver cirrhosis ([place]) 02/18/2017  . Leukocytosis 02/18/2017  . Sinus tachycardia 02/18/2017  . Tachypnea 02/18/2017  . RLL pneumonia ([place]) 02/18/2017  . Increased ammonia level 02/18/2017  . Retained metal fragment left  scapula 12/26/2015  . Fracture of glenoid process of left scapula 12/29/2014  . Status post total shoulder arthroplasty 12/27/2014  . Cocaine dependence in remission ([place]) 09/29/2014  . Severe heroin dependence in sustained remission ([place]) 09/29/2014  . Opiate abuse, episodic ([place]) 09/29/2014  . Mild tetrahydrocannabinol (THC) abuse 09/29/2014  . Chronic pain syndrome 04/18/2014  . Primary osteoarthritis of left shoulder 04/18/2014  . Primary osteoarthritis of right knee 04/18/2014  . Primary osteoarthritis of left knee 04/18/2014  . HYPERTENSION, BENIGN ESSENTIAL 03/22/2010  . CONSTIPATION 03/22/2010  . HEPATITIS C 06/04/1995   Past Medical History:  Diagnosis Date  . Anxiety   . Arthritis   . Constipation   . COPD (chronic obstructive pulmonary disease) ([facility])   . DDD (degenerative disc disease), cervical   . DDD (degenerative disc disease), lumbar   . Gout   . Heart murmur    probable bicuspid AV with mild AS, mild MR by 04/22/14 Echo (Dr. Montez Morita)  . Hepatitis C    treated 2016   . History of blood transfusion   . Hypertension   . Pneumonia 12/02/13    Family History  Problem Relation Age of Onset  . Heart disease Mother   . Kidney disease Mother   . Alcohol abuse Mother   . Breast cancer Cousin 12    Past Surgical History:  Procedure Laterality Date  . ABDOMINAL HYSTERECTOMY    . APPENDECTOMY    . BACK SURGERY    . CESAREAN SECTION     x 3  . COLONOSCOPY W/ POLYPECTOMY    . HARDWARE REMOVAL Left 12/26/2015   Procedure: REMOVAL K-WIRE LEFT SCAPULA;  Surgeon: Garald Balding, MD;  Location: [facility];  Service: Orthopedics;  Laterality: Left;  . INNER EAR SURGERY     blood vessel  . TONSILLECTOMY    . TOTAL SHOULDER ARTHROPLASTY Left 12/27/2014   Procedure: TOTAL SHOULDER ARTHROPLASTY;  Surgeon: Garald Balding, MD;  Location: [facility];  Service: Orthopedics;  Laterality: Left;   Social History   Occupational History  . Occupation: retired  Tobacco Use  .  Smoking status: Former Smoker    Packs/day: 1.00    Years: 49.00    Pack years: 49.00    Types: Cigarettes    Last attempt to quit: 2007    Years since quitting: 13.4  . Smokeless tobacco: Never Used  . Tobacco comment: quit 2007  Substance and Sexual Activity  . Alcohol use: No  . Drug use: No    Types: Marijuana    Comment: quit marijuana 12/18. last used heroin and cocaine in 1992.  Smokes Marijuana once a week., 12/22/15- "2 weeks ago"  . Sexual activity: Not on file

## 2018-11-06 DIAGNOSIS — M5416 Radiculopathy, lumbar region: Secondary | ICD-10-CM | POA: Insufficient documentation

## 2018-11-17 ENCOUNTER — Ambulatory Visit: Payer: Self-pay

## 2018-11-19 ENCOUNTER — Telehealth: Payer: Self-pay | Admitting: *Deleted

## 2018-11-19 ENCOUNTER — Other Ambulatory Visit: Payer: Self-pay

## 2018-11-19 ENCOUNTER — Ambulatory Visit (INDEPENDENT_AMBULATORY_CARE_PROVIDER_SITE_OTHER): Payer: Medicare Other | Admitting: Physical Medicine and Rehabilitation

## 2018-11-19 ENCOUNTER — Encounter: Payer: Self-pay | Admitting: Physical Medicine and Rehabilitation

## 2018-11-19 VITALS — BP 150/95 | HR 89 | Ht 61.0 in | Wt 210.0 lb

## 2018-11-19 DIAGNOSIS — M5416 Radiculopathy, lumbar region: Secondary | ICD-10-CM

## 2018-11-19 DIAGNOSIS — G8929 Other chronic pain: Secondary | ICD-10-CM

## 2018-11-19 DIAGNOSIS — M961 Postlaminectomy syndrome, not elsewhere classified: Secondary | ICD-10-CM

## 2018-11-19 DIAGNOSIS — M25551 Pain in right hip: Secondary | ICD-10-CM | POA: Diagnosis not present

## 2018-11-19 DIAGNOSIS — M5441 Lumbago with sciatica, right side: Secondary | ICD-10-CM | POA: Diagnosis not present

## 2018-11-19 DIAGNOSIS — G894 Chronic pain syndrome: Secondary | ICD-10-CM

## 2018-11-19 MED ORDER — PREDNISONE 50 MG PO TABS: ORAL_TABLET | ORAL | 0 refills | Status: DC

## 2018-11-19 NOTE — Progress Notes (Signed)
Ashley Riddle - 73 y.o. female MRN [id number]  Date of birth: [birth year]  Office Visit Note: Visit Date: 11/19/2018 PCP: Audley Hose, MD Referred by: Audley Hose, MD  Subjective: Chief Complaint  Patient presents with   Lower Back - Pain   Right Leg - Pain   HPI: Ashley Riddle is a 73 y.o. female who comes in today At the request of Dr. Joni Fears for evaluation management of chronic history of back pain and knee pain but really her main complaint has been acute onset severity starting in May of this year with right hip and leg pain to the ankle.  She reported to [facility] emergency department on May 1 because she ran out of hydrocodone that she typically takes for chronic knee pain and she was having a lot of hip pain at that point.  Dr. Durward Fortes followed up at that point and felt like she was having radicular type hip pain.  MRI had already been performed of the sacrum and the lumbar spine by her primary care physician Dr.Bakare.  Patient is taking hydrocodone without any relief at all.  She reports her pain is low back pain radiating on the right side of the right leg all the way down in a somewhat L5 distribution posterior laterally to the ankle.  She does not really endorse much in the way of paresthesia or numbness.  She reports that walking and standing do make it worse and nothing really helps at this point.  She reports medication such as Vicodin helps.  She reports her pain is a 10 out of 10 constant and sharp.  MRI was reviewed today with the patient at length with a spine model.  MRI does not show severe compression there is lateral recess narrowing particularly on the right at L4-5 which could affect the L5 nerve root.  No high-grade central stenosis.  Prior history of laminectomy.  Has intolerance to other medications including gabapentin and Lyrica.  Dr. Durward Fortes is seen her on a few more occasions for her knee pain.  Review of Systems  Constitutional:  Negative for chills, fever, malaise/fatigue and weight loss.  HENT: Negative for hearing loss and sinus pain.   Eyes: Negative for blurred vision, double vision and photophobia.  Respiratory: Negative for cough and shortness of breath.   Cardiovascular: Negative for chest pain, palpitations and leg swelling.  Gastrointestinal: Negative for abdominal pain, nausea and vomiting.  Genitourinary: Negative for flank pain.  Musculoskeletal: Positive for back pain and joint pain. Negative for myalgias.       Right hip and leg pain  Skin: Negative for itching and rash.  Neurological: Negative for tremors, focal weakness and weakness.  Endo/Heme/Allergies: Negative.   Psychiatric/Behavioral: Positive for depression.  All other systems reviewed and are negative.  Otherwise per HPI.  Assessment & Plan: Visit Diagnoses:  1. Pain in right hip   2. Lumbar radiculopathy   3. Chronic right-sided low back pain with right-sided sciatica   4. Post laminectomy syndrome   5. Chronic pain syndrome     Plan: Findings:  Acute onset right hip and leg pain and early May resulting in need to go to the emergency department because of the severity of the symptoms and the fact that she had run out of Vicodin that she typically uses.  This is been provided mainly by her primary care physician.  She has had MRI of the sacrum which was really unrevealing of anything of an  issue.  She did have MRI of the lumbar spine showing spondylosis particular at L4-5 with lateral recess narrowing particularly on the right which does fit with potential for L5 radiculitis radiculopathy.  Exam is nonfocal but patient is in extreme amount of pain and really more out of proportion than he would think with MRI findings.  We do see this sometimes with a really active nerve root however.  I will get her in as quick as I can to do the injection.  And give her some prednisone between now and then.  Hydrocodone dosing has increased from 5 mg to  7-1/2 mg now 10 mg.    Meds & Orders:  Meds ordered this encounter  Medications   predniSONE (DELTASONE) 50 MG tablet    Sig: Take 1 tablet daily with food for 5 days until finished    Dispense:  5 tablet    Refill:  0   No orders of the defined types were placed in this encounter.   Follow-up: Return for Right L5 transforaminal epidural steroid injection.   Procedures: No procedures performed  No notes on file   Clinical History: MRI LUMBAR SPINE WITHOUT AND WITH CONTRAST  TECHNIQUE: Multiplanar and multiecho pulse sequences of the lumbar spine were obtained without and with intravenous contrast.  CONTRAST: 38mL MULTIHANCE GADOBENATE DIMEGLUMINE 529 MG/ML IV SOLN  COMPARISON: Abdominopelvic CT 02/18/2017.  FINDINGS: Segmentation: Conventional anatomy assumed, with the last open disc space designated L5-S1.Concordant with previous imaging.  Alignment: Normal.  Vertebrae: No worrisome osseous lesion, acute fracture or pars defect. There is no abnormal osseous enhancement. The visualized sacroiliac joints appear unremarkable.  Conus medullaris: Extends to the L1-2 level and appears normal. No abnormal intradural enhancement.  Paraspinal and other soft tissues: No significant paraspinal findings.  Disc levels:  Sagittal images demonstrate disc degeneration with annular disc bulging at T10-11 and T11-12. There is no resulting cord deformity or high-grade foraminal narrowing.  No significant disc space findings from T12-L1 through L2-3.  L3-4: Mild disc bulging, facet and ligamentous hypertrophy. Mild foraminal narrowing bilaterally without nerve root encroachment.  L4-5: Probable right-sided laminotomy. Loss of disc height with annular disc bulging. There is advanced asymmetric facet hypertrophy on the right which contributes to asymmetric narrowing of the right lateral recess. Both foramina are mildly narrowed. Overall, these findings contribute  to mild multifactorial spinal stenosis and appear similar to previous CT.  L5-S1: Chronic loss of disc height with annular disc bulging, vacuum phenomenon and endplate osteophytes. Mild bilateral facet hypertrophy. Stable mild chronic narrowing of the lateral recesses and foramina bilaterally.  IMPRESSION: 1. Mild multifactorial spinal stenosis at L4-5 with asymmetric narrowing of the right lateral recess secondary to facet hypertrophy. This may contribute to right L5 nerve root encroachment and the patient's symptoms, although is similar in appearance to abdominal CT from 02/18/2017. 2. Chronic narrowing of the lateral recesses and foramina bilaterally at L5-S1 secondary to annular disc bulging, endplate osteophytes and facet hypertrophy. 3. No acute findings or abnormal enhancement.   Electronically Signed By: Richardean Sale M.D. On: 10/24/2018 12:44 ----------------  MRI PELVIS WITHOUT AND WITH CONTRAST  TECHNIQUE: Multiplanar multisequence MR imaging of the sacrum was performed before and after intravenous contrast administration.  COMPARISON:  Abdominopelvic CT 02/18/2017. Lumbar MRI done concurrently.  FINDINGS: Bones/Joint/Cartilage  There are mild degenerative changes of the sacroiliac joints. There is no evidence of sacroiliitis or abnormal enhancement. No acute osseous findings are seen within the bony pelvis. Lower lumbar spondylosis is noted, details  dictated separately. The hip joints and femoral heads demonstrate no significant findings.  Ligaments  Not relevant for exam/indication.  Muscles and Tendons The visualized posterior pelvic musculature appears symmetric. Mild gluteus and hamstring tendinosis bilaterally.  Soft tissues No fluid collection, inflammatory changes or abnormal enhancement.  IMPRESSION: 1. No acute or significant findings in the sacrum. 2. Mild sacroiliac degenerative changes bilaterally. 3. Mild gluteus and  hamstring tendinosis bilaterally. 4. Lumbar spine findings dictated separately.   Electronically Signed   By: Richardean Sale M.D.   On: 10/24/2018 12:52   She reports that she quit smoking about 13 years ago. Her smoking use included cigarettes. She has a 49.00 pack-year smoking history. She has never used smokeless tobacco. No results for input(s): HGBA1C, LABURIC in the last 8760 hours.  Objective:  VS:  HT:5\' 1"  (154.9 cm)    WT:210 lb (95.3 kg)   BMI:39.7     BP:(!) 150/95   HR:89bpm   TEMP: ( )   RESP:  Physical Exam Vitals signs and nursing note reviewed.  Constitutional:      General: She is in acute distress.     Appearance: Normal appearance. She is well-developed. She is obese.  HENT:     Head: Normocephalic and atraumatic.     Nose: Nose normal.     Mouth/Throat:     Mouth: Mucous membranes are moist.     Pharynx: Oropharynx is clear.  Eyes:     Conjunctiva/sclera: Conjunctivae normal.     Pupils: Pupils are equal, round, and reactive to light.  Neck:     Musculoskeletal: Normal range of motion and neck supple.  Cardiovascular:     Rate and Rhythm: Regular rhythm.  Pulmonary:     Effort: Pulmonary effort is normal. No respiratory distress.  Abdominal:     General: There is no distension.     Palpations: Abdomen is soft.     Tenderness: There is no guarding.  Musculoskeletal:     Right lower leg: No edema.     Left lower leg: No edema.     Comments: Patient has difficulty with any movement she has a lot of pain with even any idea of moving the hip or moving the back.  When I do get her to stand she is slow to stand with full extension she does have some pain with extension of the spine.  She has no pain with hip rotation when I try to distract her and rotated.  She has good distal strength without clonus bilaterally.  Hard to really tell of any pain to palpation everywhere we try to touch is pretty tender to even light palpation  Skin:    General: Skin is warm and  dry.     Findings: No erythema or rash.  Neurological:     General: No focal deficit present.     Mental Status: She is alert and oriented to person, place, and time.     Motor: No abnormal muscle tone.     Coordination: Coordination normal.     Gait: Gait normal.  Psychiatric:        Thought Content: Thought content normal.     Comments: Somewhat distraught and tearful     Ortho Exam Imaging: No results found.  Past Medical/Family/Surgical/Social History: Medications & Allergies reviewed per EMR, new medications updated. Patient Active Problem List   Diagnosis Date Noted   Acute right-sided low back pain 10/06/2018   COPD with acute exacerbation ([place]) 04/20/2018  Anxiety and depression 04/20/2018   CAP (community acquired pneumonia) 02/18/2017   Liver cirrhosis ([place]) 02/18/2017   Leukocytosis 02/18/2017   Sinus tachycardia 02/18/2017   Tachypnea 02/18/2017   RLL pneumonia ([place]) 02/18/2017   Increased ammonia level 02/18/2017   Retained metal fragment left scapula 12/26/2015   Fracture of glenoid process of left scapula 12/29/2014   Status post total shoulder arthroplasty 12/27/2014   Cocaine dependence in remission ([place]) 09/29/2014   Severe heroin dependence in sustained remission ([place]) 09/29/2014   Opiate abuse, episodic ([place]) 09/29/2014   Mild tetrahydrocannabinol (THC) abuse 09/29/2014   Chronic pain syndrome 04/18/2014   Primary osteoarthritis of left shoulder 04/18/2014   Primary osteoarthritis of right knee 04/18/2014   Primary osteoarthritis of left knee 04/18/2014   HYPERTENSION, BENIGN ESSENTIAL 03/22/2010   CONSTIPATION 03/22/2010   HEPATITIS C 06/04/1995   Past Medical History:  Diagnosis Date   Anxiety    Arthritis    Constipation    COPD (chronic obstructive pulmonary disease) ([facility])    DDD (degenerative disc disease), cervical    DDD (degenerative disc disease), lumbar    Gout    Heart murmur    probable bicuspid  AV with mild AS, mild MR by 04/22/14 Echo (Dr. Montez Morita)   Hepatitis C    treated 2016    History of blood transfusion    Hypertension    Pneumonia 12/02/13   Family History  Problem Relation Age of Onset   Heart disease Mother    Kidney disease Mother    Alcohol abuse Mother    Breast cancer Cousin 53   Past Surgical History:  Procedure Laterality Date   ABDOMINAL HYSTERECTOMY     APPENDECTOMY     BACK SURGERY     CESAREAN SECTION     x 3   COLONOSCOPY W/ POLYPECTOMY     HARDWARE REMOVAL Left 12/26/2015   Procedure: REMOVAL K-WIRE LEFT SCAPULA;  Surgeon: Garald Balding, MD;  Location: [facility];  Service: Orthopedics;  Laterality: Left;   INNER EAR SURGERY     blood vessel   TONSILLECTOMY     TOTAL SHOULDER ARTHROPLASTY Left 12/27/2014   Procedure: TOTAL SHOULDER ARTHROPLASTY;  Surgeon: Garald Balding, MD;  Location: [facility];  Service: Orthopedics;  Laterality: Left;   Social History   Occupational History   Occupation: retired  Tobacco Use   Smoking status: Former Smoker    Packs/day: 1.00    Years: 49.00    Pack years: 49.00    Types: Cigarettes    Quit date: 2007    Years since quitting: 13.5   Smokeless tobacco: Never Used   Tobacco comment: quit 2007  Substance and Sexual Activity   Alcohol use: No   Drug use: No    Types: Marijuana    Comment: quit marijuana 12/18. last used heroin and cocaine in 1992.  Smokes Marijuana once a week., 12/22/15- "2 weeks ago"   Sexual activity: Not on file

## 2018-11-19 NOTE — Telephone Encounter (Signed)
Did, thanks.

## 2018-11-19 NOTE — Telephone Encounter (Signed)
Notification or Prior Authorization is not required for the requested services  This [employer] members plan does not currently require a prior authorization for [id number]  Decision ID #:[id number]

## 2018-11-19 NOTE — Progress Notes (Signed)
 .  Numeric Pain Rating Scale and Functional Assessment Average Pain 10 Pain Right Now 10 My pain is constant and sharp Pain is worse with: walking and standing Pain improves with: medication   In the last MONTH (on 0-10 scale) has pain interfered with the following?  1. General activity like being  able to carry out your everyday physical activities such as walking, climbing stairs, carrying groceries, or moving a chair?  Rating(9)  2. Relation with others like being able to carry out your usual social activities and roles such as  activities at home, at work and in your community. Rating(9)  3. Enjoyment of life such that you have  been bothered by emotional problems such as feeling anxious, depressed or irritable?  Rating(7)

## 2018-11-21 ENCOUNTER — Ambulatory Visit: Payer: Medicare Other

## 2018-11-25 ENCOUNTER — Other Ambulatory Visit: Payer: Self-pay

## 2018-11-25 ENCOUNTER — Encounter: Payer: Self-pay | Admitting: Physical Medicine and Rehabilitation

## 2018-11-25 ENCOUNTER — Ambulatory Visit: Payer: Self-pay

## 2018-11-25 ENCOUNTER — Encounter: Payer: Medicare Other | Admitting: Physical Medicine and Rehabilitation

## 2018-11-25 ENCOUNTER — Ambulatory Visit (INDEPENDENT_AMBULATORY_CARE_PROVIDER_SITE_OTHER): Payer: Medicare Other | Admitting: Physical Medicine and Rehabilitation

## 2018-11-25 DIAGNOSIS — M5416 Radiculopathy, lumbar region: Secondary | ICD-10-CM

## 2018-11-25 MED ORDER — BETAMETHASONE SOD PHOS & ACET 6 (3-3) MG/ML IJ SUSP
12.0000 mg | Freq: Once | INTRAMUSCULAR | Status: AC
  Administered 2018-11-25: 12 mg

## 2018-11-25 NOTE — Progress Notes (Signed)
 .  Numeric Pain Rating Scale and Functional Assessment Average Pain 8   In the last MONTH (on 0-10 scale) has pain interfered with the following?  1. General activity like being  able to carry out your everyday physical activities such as walking, climbing stairs, carrying groceries, or moving a chair?  Rating(6)   +Driver, -BT, -Dye Allergies.  

## 2018-12-01 ENCOUNTER — Emergency Department (HOSPITAL_COMMUNITY)
Admission: EM | Admit: 2018-12-01 | Discharge: 2018-12-01 | Disposition: A | Discharge status: Home / Self Care | Payer: Medicare Other | Attending: Emergency Medicine | Admitting: Emergency Medicine

## 2018-12-01 ENCOUNTER — Other Ambulatory Visit: Payer: Self-pay

## 2018-12-01 ENCOUNTER — Encounter (HOSPITAL_COMMUNITY): Payer: Self-pay | Admitting: Emergency Medicine

## 2018-12-01 DIAGNOSIS — Z87891 Personal history of nicotine dependence: Secondary | ICD-10-CM | POA: Insufficient documentation

## 2018-12-01 DIAGNOSIS — Z79899 Other long term (current) drug therapy: Secondary | ICD-10-CM | POA: Insufficient documentation

## 2018-12-01 DIAGNOSIS — M5431 Sciatica, right side: Secondary | ICD-10-CM | POA: Diagnosis not present

## 2018-12-01 DIAGNOSIS — I10 Essential (primary) hypertension: Secondary | ICD-10-CM | POA: Diagnosis not present

## 2018-12-01 DIAGNOSIS — M25551 Pain in right hip: Secondary | ICD-10-CM | POA: Diagnosis present

## 2018-12-01 DIAGNOSIS — J449 Chronic obstructive pulmonary disease, unspecified: Secondary | ICD-10-CM | POA: Diagnosis not present

## 2018-12-01 MED ORDER — HYDROMORPHONE HCL 1 MG/ML IJ SOLN
1.0000 mg | Freq: Once | INTRAMUSCULAR | Status: AC
  Administered 2018-12-01: 1 mg via INTRAMUSCULAR
  Filled 2018-12-01: qty 1

## 2018-12-01 MED ORDER — HYDROCODONE-ACETAMINOPHEN 5-325 MG PO TABS: 1.0000 | ORAL_TABLET | Freq: Four times a day (QID) | ORAL | 0 refills | Status: DC | PRN

## 2018-12-01 NOTE — ED Provider Notes (Signed)
[facility] EMERGENCY DEPARTMENT Provider Note   CSN: [id number] Arrival date & time: 12/01/18  1410     History   Chief Complaint Chief Complaint  Patient presents with  . Hip Pain    HPI Ashley Riddle is a 73 y.o. female.  She has a history of degenerative disease in her spine and has had lumbar surgery remote.  She said her back started acting up with radiation down her right leg a few weeks ago and she saw somebody and had a spinal injection last week.  She is out of her Vicodin and the injection did not do anything for pain and now she is in tears.  She has not seen her primary care doctor for this.  No trauma no fevers no abdominal pain no urine or bowel symptoms.  She said the pain is in her back radiates to her right hip and down to her right ankle.  Not associated with any numbness or weakness.      Hip Pain This is a recurrent problem. The current episode started more than 1 week ago. The problem occurs constantly. The problem has not changed since onset.Pertinent negatives include no chest pain, no abdominal pain, no headaches and no shortness of breath. The symptoms are aggravated by bending and twisting. Nothing relieves the symptoms. She has tried nothing for the symptoms. The treatment provided no relief.    Past Medical History:  Diagnosis Date  . Anxiety   . Arthritis   . Constipation   . COPD (chronic obstructive pulmonary disease) ([place])   . DDD (degenerative disc disease), cervical   . DDD (degenerative disc disease), lumbar   . Gout   . Heart murmur    probable bicuspid AV with mild AS, mild MR by 04/22/14 Echo (Dr. Montez Morita)  . Hepatitis C    treated 2016   . History of blood transfusion   . Hypertension   . Pneumonia 12/02/13    Patient Active Problem List   Diagnosis Date Noted  . Acute right-sided low back pain 10/06/2018  . COPD with acute exacerbation ([place]) 04/20/2018  . Anxiety and depression 04/20/2018  . CAP (community  acquired pneumonia) 02/18/2017  . Liver cirrhosis ([place]) 02/18/2017  . Leukocytosis 02/18/2017  . Sinus tachycardia 02/18/2017  . Tachypnea 02/18/2017  . RLL pneumonia ([place]) 02/18/2017  . Increased ammonia level 02/18/2017  . Retained metal fragment left scapula 12/26/2015  . Fracture of glenoid process of left scapula 12/29/2014  . Status post total shoulder arthroplasty 12/27/2014  . Cocaine dependence in remission ([place]) 09/29/2014  . Severe heroin dependence in sustained remission ([place]) 09/29/2014  . Opiate abuse, episodic ([place]) 09/29/2014  . Mild tetrahydrocannabinol (THC) abuse 09/29/2014  . Chronic pain syndrome 04/18/2014  . Primary osteoarthritis of left shoulder 04/18/2014  . Primary osteoarthritis of right knee 04/18/2014  . Primary osteoarthritis of left knee 04/18/2014  . HYPERTENSION, BENIGN ESSENTIAL 03/22/2010  . CONSTIPATION 03/22/2010  . HEPATITIS C 06/04/1995    Past Surgical History:  Procedure Laterality Date  . ABDOMINAL HYSTERECTOMY    . APPENDECTOMY    . BACK SURGERY    . CESAREAN SECTION     x 3  . COLONOSCOPY W/ POLYPECTOMY    . HARDWARE REMOVAL Left 12/26/2015   Procedure: REMOVAL K-WIRE LEFT SCAPULA;  Surgeon: Garald Balding, MD;  Location: [facility];  Service: Orthopedics;  Laterality: Left;  . INNER EAR SURGERY     blood vessel  . TONSILLECTOMY    .  TOTAL SHOULDER ARTHROPLASTY Left 12/27/2014   Procedure: TOTAL SHOULDER ARTHROPLASTY;  Surgeon: Garald Balding, MD;  Location: [facility];  Service: Orthopedics;  Laterality: Left;     OB History   No obstetric history on file.      Home Medications    Prior to Admission medications   Medication Sig Start Date End Date Taking? Authorizing Provider  albuterol (PROVENTIL HFA;VENTOLIN HFA) 108 (90 Base) MCG/ACT inhaler Inhale 2 puffs into the lungs every 2 (two) hours as needed for wheezing or shortness of breath.     [provider]  budesonide (PULMICORT) 0.5 MG/2ML nebulizer solution Take  2 mLs (0.5 mg total) by nebulization 2 (two) times daily. 04/21/18 04/21/19  Amin, Jeanella Flattery, MD  clotrimazole-betamethasone (LOTRISONE) cream Apply 1 application topically 2 (two) times daily as needed (rash under breasts).  03/25/18   [provider]  colchicine 0.6 MG tablet Take 0.6 mg by mouth daily as needed (gout flares). Colcrys    [provider]  Cyanocobalamin (VITAMIN B-12 PO) Take 1 tablet by mouth daily.    [provider]  diclofenac sodium (VOLTAREN) 1 % GEL Apply 2 g topically 4 (four) times daily as needed (lower back pain). 10/02/18   Long, Wonda Olds, MD  DULoxetine (CYMBALTA) 30 MG capsule Take 1 capsule (30 mg total) by mouth daily. Patient not taking: Reported on 04/20/2018 09/29/14 12/19/16  Charlcie Cradle, MD  HYDROcodone-acetaminophen (NORCO/VICODIN) 5-325 MG tablet Take 1-2 tablets by mouth every 4 (four) hours as needed. Patient taking differently: Take 1 tablet by mouth 4 (four) times daily as needed (pain).  01/24/16   Law, Bea Graff, PA-C  losartan-hydrochlorothiazide (HYZAAR) 100-25 MG tablet Take 1 tablet by mouth at bedtime.  06/12/17   [provider]  Multiple Vitamin (MULTIVITAMIN WITH MINERALS) TABS tablet Take 1 tablet by mouth daily.    [provider]  oxyCODONE-acetaminophen (PERCOCET/ROXICET) 5-325 MG tablet TAKE 1 TABLET BY MOUTH EVERY 6 HOURS AS NEEDED FOR 5 DAYS 10/13/18   [provider]  predniSONE (DELTASONE) 50 MG tablet Take 1 tablet daily with food for 5 days until finished 11/19/18   Magnus Sinning, MD  sertraline (ZOLOFT) 100 MG tablet Take 50 mg by mouth daily as needed (depression).     [provider]  sertraline (ZOLOFT) 50 MG tablet Patient states she only takes 25 mg 10/15/18   [provider]  tiotropium (SPIRIVA HANDIHALER) 18 MCG inhalation capsule Place 18 mcg into inhaler and inhale daily as needed (shortness of breath).     [provider]  tiZANidine  (ZANAFLEX) 2 MG tablet Take 1 tablet (2 mg total) by mouth every 6 (six) hours as needed for muscle spasms. 10/02/18   Long, Wonda Olds, MD    Family History Family History  Problem Relation Age of Onset  . Heart disease Mother   . Kidney disease Mother   . Alcohol abuse Mother   . Breast cancer Cousin 76    Social History Social History   Tobacco Use  . Smoking status: Former Smoker    Packs/day: 1.00    Years: 49.00    Pack years: 49.00    Types: Cigarettes    Quit date: 2007    Years since quitting: 13.5  . Smokeless tobacco: Never Used  . Tobacco comment: quit 2007  Substance Use Topics  . Alcohol use: No  . Drug use: No    Types: Marijuana    Comment: quit marijuana 12/18. last used  heroin and cocaine in 1992.  Smokes Marijuana once a week., 12/22/15- "2 weeks ago"     Allergies   Gabapentin, Pregabalin, Aspirin, and Penicillins   Review of Systems Review of Systems  Constitutional: Negative for fever.  HENT: Negative for sore throat.   Eyes: Negative for visual disturbance.  Respiratory: Negative for shortness of breath.   Cardiovascular: Negative for chest pain.  Gastrointestinal: Negative for abdominal pain.  Genitourinary: Negative for dysuria.  Musculoskeletal: Positive for back pain.  Skin: Negative for rash.  Neurological: Negative for headaches.     Physical Exam Updated Vital Signs BP (!) 207/89 (BP Location: Right Arm)   Pulse 99   Temp 98 F (36.7 C) (Oral)   Resp 13   Ht 5\' 1"  (1.549 m)   Wt 101.6 kg   SpO2 99%   BMI 42.32 kg/m   Physical Exam Vitals signs and nursing note reviewed.  Constitutional:      General: She is not in acute distress.    Appearance: She is well-developed.  HENT:     Head: Normocephalic and atraumatic.     Mouth/Throat:     Mouth: Mucous membranes are moist.     Pharynx: Oropharynx is clear.  Eyes:     Conjunctiva/sclera: Conjunctivae normal.  Neck:     Musculoskeletal: Neck supple.  Cardiovascular:      Rate and Rhythm: Normal rate and regular rhythm.     Heart sounds: No murmur.  Pulmonary:     Effort: Pulmonary effort is normal. No respiratory distress.     Breath sounds: Normal breath sounds.  Abdominal:     Palpations: Abdomen is soft.     Tenderness: There is no abdominal tenderness.  Musculoskeletal:     Right lower leg: No edema.     Left lower leg: No edema.  Skin:    General: Skin is warm and dry.     Capillary Refill: Capillary refill takes less than 2 seconds.  Neurological:     General: No focal deficit present.     Mental Status: She is alert.     Comments: Patient has right lower extremity intact sensation and her EHL, foot dorsi and plantar flexion intact.  She has a lot of pain with hip flexion.  Think pain is causing her to give way and not exhibit full strength because she said she is ambulatory.  Left lower extremity full range of motion without any limitations or pain.      ED Treatments / Results  Labs (all labs ordered are listed, but only abnormal results are displayed) Labs Reviewed - No data to display  EKG None  Radiology No results found.  Procedures Procedures (including critical care time)  Medications Ordered in ED Medications  HYDROmorphone (DILAUDID) injection 1 mg (has no administration in time range)     Initial Impression / Assessment and Plan / ED Course  I have reviewed the triage vital signs and the nursing notes.  Pertinent labs & imaging results that were available during my care of the patient were reviewed by me and considered in my medical decision making (see chart for details).  Clinical Course as of Dec 02 906  Tue Nov 30, 5665  862 73 year old female with known radicular back issues down the right leg and an MRI a few months ago here with worsening pain despite getting spinal injection a week ago.   [MB]  6440 She has no gross neuro deficits but is greatly limited by pain.  She has been not taking any narcotic pain  medicine thinking spinal shot with her.  I gave her an IM dose of some Dilaudid which is helped greatly and she is comfortable going home on oral Vicodin which she was on prior to this.   [MB]  3646 She understands she needs to follow-up with her doctor for any further pain medicine along with probably following back up with her neurosurgeon to see if she needs other interventions.   [MB]  8032 She had already finished a course of steroids last week.   [MB]    Clinical Course User Index [MB] Hayden Rasmussen, MD        Final Clinical Impressions(s) / ED Diagnoses   Final diagnoses:  Sciatica of right side    ED Discharge Orders         Ordered    HYDROcodone-acetaminophen (NORCO/VICODIN) 5-325 MG tablet  4 times daily PRN     12/01/18 1817           Hayden Rasmussen, MD 12/02/18 [phone]

## 2018-12-01 NOTE — ED Notes (Signed)
Patient verbalizes understanding of discharge instructions. Opportunity for questioning and answers were provided. Armband removed by staff, pt discharged from ED.  

## 2018-12-01 NOTE — ED Triage Notes (Signed)
Pt states has chronic lower back pain that radiates into her right leg. Pt states she had an injection in her back last week for it, but it has not helped.

## 2018-12-01 NOTE — Discharge Instructions (Addendum)
You were seen in the emergency department for worsening right-sided back hip and leg pain.  You are being treated by your doctors for sciatica with steroids and a spinal injection.  We are prescribing you some Vicodin which may help with the pain short-term but it will be important to follow-up with your doctors for further management of this.  Please return if any worsening symptoms.

## 2018-12-02 ENCOUNTER — Telehealth: Payer: Self-pay | Admitting: Orthopaedic Surgery

## 2018-12-02 NOTE — Telephone Encounter (Signed)
Ashley Riddle called stating her mom had appointment with Dr. Ernestina Patches on 11/25/18.  Ashley Riddle states her mom did not get any pain relief from the epidural injection.  Ashley Riddle states her mom was in so much pain she had to take her to the ER yesterday.  Ashley Riddle states her mom is in severe pain from her hip to her ankle and is requesting a return call to discuss what to do next.

## 2018-12-02 NOTE — Telephone Encounter (Signed)
Please see below and schedule appt. Thank you.

## 2018-12-02 NOTE — Telephone Encounter (Signed)
Refer to Dr Lorin Mercy for another opinion

## 2018-12-02 NOTE — Telephone Encounter (Signed)
Please advise 

## 2018-12-03 NOTE — Progress Notes (Signed)
Ashley Riddle - 73 y.o. female MRN [id number]  Date of birth: 12-Jul-1945  Office Visit Note: Visit Date: 11/25/2018 PCP: Audley Hose, MD Referred by: Audley Hose, MD  Subjective: Chief Complaint  Patient presents with  . Lower Back - Pain   HPI:  [name] is a 73 y.o. female who comes in today For planned right L5 transforaminal epidural steroid injection for right hip and leg radicular type leg pain.  Please see our prior notes for further details justification.  ROS Otherwise per HPI.  Assessment & Plan: Visit Diagnoses:  1. Lumbar radiculopathy     Plan: No additional findings.   Meds & Orders:  Meds ordered this encounter  Medications  . betamethasone acetate-betamethasone sodium phosphate (CELESTONE) injection 12 mg    Orders Placed This Encounter  Procedures  . XR C-ARM NO REPORT  . Epidural Steroid injection    Follow-up: No follow-ups on file.   Procedures: No procedures performed  Lumbosacral Transforaminal Epidural Steroid Injection - Sub-Pedicular Approach with Fluoroscopic Guidance  Patient: Ashley Riddle      Date of Birth: 1945/08/05 MRN: [id number] PCP: Audley Hose, MD      Visit Date: 11/25/2018   Universal Protocol:    Date/Time: 11/25/2018  Consent Given By: the patient  Position: PRONE  Additional Comments: Vital signs were monitored before and after the procedure. Patient was prepped and draped in the usual sterile fashion. The correct patient, procedure, and site was verified.   Injection Procedure Details:  Procedure Site One Meds Administered:  Meds ordered this encounter  Medications  . betamethasone acetate-betamethasone sodium phosphate (CELESTONE) injection 12 mg    Laterality: Right  Location/Site:  L5-S1  Needle size: 22 G  Needle type: Spinal  Needle Placement: Transforaminal  Findings:    -Comments: Excellent flow of contrast along the nerve and into the epidural space.  Procedure Details: After squaring off the end-plates to get a true AP view, the C-arm was positioned so that an oblique view of the foramen as noted above was visualized. The target area is just inferior to the "nose of the scotty dog" or sub pedicular. The soft tissues overlying this structure were infiltrated with 2-3 ml. of 1% Lidocaine without Epinephrine.  The spinal needle was inserted toward the target using a "trajectory" view along the fluoroscope beam.  Under AP and lateral visualization, the needle was advanced so it did not puncture dura and was located close the 6 O'Clock position of the pedical in AP tracterory. Biplanar projections were used to confirm position. Aspiration was confirmed to be negative for CSF and/or blood. A 1-2 ml. volume of Isovue-250 was injected and flow of contrast was noted at each level. Radiographs were obtained for documentation purposes.   After attaining the desired flow of contrast documented above, a 0.5 to 1.0 ml test dose of 0.25% Marcaine was injected into each respective transforaminal space.  The patient was observed for 90 seconds post injection.  After no sensory deficits were reported, and normal lower extremity motor function was noted,   the above injectate was administered so that equal amounts of the injectate were placed at each foramen (level) into the transforaminal epidural space.   Additional Comments:  The patient tolerated the procedure well Dressing: 2 x 2 sterile gauze and Band-Aid    Post-procedure details: Patient was observed during the procedure. Post-procedure instructions were reviewed.  Patient left the clinic in stable condition.  Clinical History: MRI LUMBAR SPINE WITHOUT AND WITH CONTRAST  TECHNIQUE: Multiplanar and multiecho pulse sequences of the lumbar spine were obtained without and with intravenous contrast.  CONTRAST: 72mL MULTIHANCE GADOBENATE DIMEGLUMINE 529 MG/ML IV SOLN  COMPARISON:  Abdominopelvic CT 02/18/2017.  FINDINGS: Segmentation: Conventional anatomy assumed, with the last open disc space designated L5-S1.Concordant with previous imaging.  Alignment: Normal.  Vertebrae: No worrisome osseous lesion, acute fracture or pars defect. There is no abnormal osseous enhancement. The visualized sacroiliac joints appear unremarkable.  Conus medullaris: Extends to the L1-2 level and appears normal. No abnormal intradural enhancement.  Paraspinal and other soft tissues: No significant paraspinal findings.  Disc levels:  Sagittal images demonstrate disc degeneration with annular disc bulging at T10-11 and T11-12. There is no resulting cord deformity or high-grade foraminal narrowing.  No significant disc space findings from T12-L1 through L2-3.  L3-4: Mild disc bulging, facet and ligamentous hypertrophy. Mild foraminal narrowing bilaterally without nerve root encroachment.  L4-5: Probable right-sided laminotomy. Loss of disc height with annular disc bulging. There is advanced asymmetric facet hypertrophy on the right which contributes to asymmetric narrowing of the right lateral recess. Both foramina are mildly narrowed. Overall, these findings contribute to mild multifactorial spinal stenosis and appear similar to previous CT.  L5-S1: Chronic loss of disc height with annular disc bulging, vacuum phenomenon and endplate osteophytes. Mild bilateral facet hypertrophy. Stable mild chronic narrowing of the lateral recesses and foramina bilaterally.  IMPRESSION: 1. Mild multifactorial spinal stenosis at L4-5 with asymmetric narrowing of the right lateral recess secondary to facet hypertrophy. This may contribute to right L5 nerve root encroachment and the patient's symptoms, although is similar in appearance to abdominal CT from 02/18/2017. 2. Chronic narrowing of the lateral recesses and foramina bilaterally at L5-S1 secondary to annular disc  bulging, endplate osteophytes and facet hypertrophy. 3. No acute findings or abnormal enhancement.   Electronically Signed By: Richardean Sale M.D. On: 10/24/2018 12:44 ----------------  MRI PELVIS WITHOUT AND WITH CONTRAST  TECHNIQUE: Multiplanar multisequence MR imaging of the sacrum was performed before and after intravenous contrast administration.  COMPARISON:  Abdominopelvic CT 02/18/2017. Lumbar MRI done concurrently.  FINDINGS: Bones/Joint/Cartilage  There are mild degenerative changes of the sacroiliac joints. There is no evidence of sacroiliitis or abnormal enhancement. No acute osseous findings are seen within the bony pelvis. Lower lumbar spondylosis is noted, details dictated separately. The hip joints and femoral heads demonstrate no significant findings.  Ligaments  Not relevant for exam/indication.  Muscles and Tendons The visualized posterior pelvic musculature appears symmetric. Mild gluteus and hamstring tendinosis bilaterally.  Soft tissues No fluid collection, inflammatory changes or abnormal enhancement.  IMPRESSION: 1. No acute or significant findings in the sacrum. 2. Mild sacroiliac degenerative changes bilaterally. 3. Mild gluteus and hamstring tendinosis bilaterally. 4. Lumbar spine findings dictated separately.   Electronically Signed   By: Richardean Sale M.D.   On: 10/24/2018 12:52     Objective:  VS:  HT:    WT:   BMI:     BP:   HR: bpm  TEMP: ( )  RESP:  Physical Exam  Ortho Exam Imaging: No results found.

## 2018-12-03 NOTE — Procedures (Signed)
Lumbosacral Transforaminal Epidural Steroid Injection - Sub-Pedicular Approach with Fluoroscopic Guidance  Patient: Ashley Riddle      Date of Birth: [birth year] MRN: [id number] PCP: Audley Hose, MD      Visit Date: 11/25/2018   Universal Protocol:    Date/Time: 11/25/2018  Consent Given By: the patient  Position: PRONE  Additional Comments: Vital signs were monitored before and after the procedure. Patient was prepped and draped in the usual sterile fashion. The correct patient, procedure, and site was verified.   Injection Procedure Details:  Procedure Site One Meds Administered:  Meds ordered this encounter  Medications  . betamethasone acetate-betamethasone sodium phosphate (CELESTONE) injection 12 mg    Laterality: Right  Location/Site:  L5-S1  Needle size: 22 G  Needle type: Spinal  Needle Placement: Transforaminal  Findings:    -Comments: Excellent flow of contrast along the nerve and into the epidural space.  Procedure Details: After squaring off the end-plates to get a true AP view, the C-arm was positioned so that an oblique view of the foramen as noted above was visualized. The target area is just inferior to the "nose of the scotty dog" or sub pedicular. The soft tissues overlying this structure were infiltrated with 2-3 ml. of 1% Lidocaine without Epinephrine.  The spinal needle was inserted toward the target using a "trajectory" view along the fluoroscope beam.  Under AP and lateral visualization, the needle was advanced so it did not puncture dura and was located close the 6 O'Clock position of the pedical in AP tracterory. Biplanar projections were used to confirm position. Aspiration was confirmed to be negative for CSF and/or blood. A 1-2 ml. volume of Isovue-250 was injected and flow of contrast was noted at each level. Radiographs were obtained for documentation purposes.   After attaining the desired flow of contrast documented above, a  0.5 to 1.0 ml test dose of 0.25% Marcaine was injected into each respective transforaminal space.  The patient was observed for 90 seconds post injection.  After no sensory deficits were reported, and normal lower extremity motor function was noted,   the above injectate was administered so that equal amounts of the injectate were placed at each foramen (level) into the transforaminal epidural space.   Additional Comments:  The patient tolerated the procedure well Dressing: 2 x 2 sterile gauze and Band-Aid    Post-procedure details: Patient was observed during the procedure. Post-procedure instructions were reviewed.  Patient left the clinic in stable condition.

## 2018-12-15 ENCOUNTER — Ambulatory Visit: Payer: Medicare Other

## 2018-12-15 ENCOUNTER — Other Ambulatory Visit: Payer: Self-pay

## 2018-12-15 ENCOUNTER — Ambulatory Visit (INDEPENDENT_AMBULATORY_CARE_PROVIDER_SITE_OTHER): Payer: Medicare Other | Admitting: Orthopaedic Surgery

## 2018-12-15 VITALS — BP 139/88 | HR 89 | Ht 61.0 in | Wt 224.0 lb

## 2018-12-15 DIAGNOSIS — M5416 Radiculopathy, lumbar region: Secondary | ICD-10-CM | POA: Diagnosis not present

## 2018-12-15 DIAGNOSIS — M7061 Trochanteric bursitis, right hip: Secondary | ICD-10-CM

## 2018-12-15 MED ORDER — METHYLPREDNISOLONE ACETATE 40 MG/ML IJ SUSP
40.0000 mg | INTRAMUSCULAR | Status: AC | PRN
  Administered 2018-12-15: 40 mg via INTRA_ARTICULAR

## 2018-12-15 MED ORDER — LIDOCAINE HCL 1 % IJ SOLN
0.5000 mL | INTRAMUSCULAR | Status: AC | PRN
  Administered 2018-12-15: .5 mL

## 2018-12-15 NOTE — Progress Notes (Signed)
Office Visit Note   Patient: Ashley Riddle           Date of Birth: August 13, 1945           MRN: [id number] Visit Date: 12/15/2018              Requested by: Audley Hose, MD [place],  [place] [id number] PCP: Audley Hose, MD   Assessment & Plan: Visit Diagnoses:  1. Radiculopathy, lumbar region   2. Trochanteric bursitis, right hip     Plan: Injection performed with good relief.  We will recheck her again in 3 weeks.  I reviewed the MRI with her and at this point I do not think she has enough compression the lateral recess to consider operative intervention at this point.  She does not have any weakness.  Hopefully the injection in her trochanter will help.  Follow-Up Instructions: Return in about 3 weeks (around 01/05/2019).   Orders:  Orders Placed This Encounter  Procedures  . Large Joint Inj: R greater trochanter  . XR Lumb Spine Flex&Ext Only   No orders of the defined types were placed in this encounter.     Procedures: Large Joint Inj: R greater trochanter on 12/15/2018 2:35 PM Medications: 0.5 mL lidocaine 1 %; 40 mg methylPREDNISolone acetate 40 MG/ML      Clinical Data: No additional findings.   Subjective: Chief Complaint  Patient presents with  . Lower Back - Pain    HPI 73 year old female referred by Dr. Durward Fortes for possible repeat surgery on the right at L4-5.  Previous surgery by Dr. Marily Memos in the 1980s.  She states her sister died last month she had an increased lateral hip pain buttocks pain pain radiates down most times her knee sometimes down to the dorsum of her foot.  She has difficulty moving bending and walking.  She not able to get comfortable.  She has been taking oxycodone 10.  She had an epidural transforaminal on the right at L5 and states that actually made the pain worse since that time.  She points laterally over the trochanter where she has significant pain.  MRI scan 10/24/2018 showed mild  multifactorial stenosis with some lateral recess narrowing on the right and facet hypertrophy with increased facet fluid similar in appearance to previous CT scan 02/08/2017.  She denies chills or fever.  No history of diabetes.  Review of Systems positive for cirrhosis hepatitis C hypertension chronic pain previous heroin dependency history of opioid abuse THC use.  Total shoulder arthroplasty.  Previous lumbar surgery 1980s right L4-5 by Dr. Marily Memos.  Otherwise negative is obtains HPI.   Objective: Vital Signs: BP 139/88   Pulse 89   Ht 5\' 1"  (1.549 m)   Wt 224 lb (101.6 kg)   BMI 42.32 kg/m   Physical Exam Constitutional:      Appearance: She is well-developed.  HENT:     Head: Normocephalic.     Right Ear: External ear normal.     Left Ear: External ear normal.  Eyes:     Pupils: Pupils are equal, round, and reactive to light.  Neck:     Thyroid: No thyromegaly.     Trachea: No tracheal deviation.  Cardiovascular:     Rate and Rhythm: Normal rate.  Pulmonary:     Effort: Pulmonary effort is normal.  Abdominal:     Palpations: Abdomen is soft.  Skin:    General: Skin is  warm and dry.  Neurological:     Mental Status: She is alert and oriented to person, place, and time.  Psychiatric:        Behavior: Behavior normal.     Ortho Exam patient has negative straight leg raising 90 degrees sciatic notch tenderness worse in the right than left severe right trochanteric bursal tenderness with palpation.  Negative logroll of the hips knees reach full extension knee and ankle jerk are intact anterior tib EHL is 5 out of 5 both right and left no peroneal weakness.  Specialty Comments:  No specialty comments available.  Imaging: No results found.   PMFS History: Patient Active Problem List   Diagnosis Date Noted  . Trochanteric bursitis, right hip 12/15/2018  . Acute right-sided low back pain 10/06/2018  . COPD with acute exacerbation ([place]) 04/20/2018  . Anxiety and  depression 04/20/2018  . CAP (community acquired pneumonia) 02/18/2017  . Liver cirrhosis ([place]) 02/18/2017  . Leukocytosis 02/18/2017  . Sinus tachycardia 02/18/2017  . Tachypnea 02/18/2017  . RLL pneumonia ([place]) 02/18/2017  . Increased ammonia level 02/18/2017  . Retained metal fragment left scapula 12/26/2015  . Fracture of glenoid process of left scapula 12/29/2014  . Status post total shoulder arthroplasty 12/27/2014  . Cocaine dependence in remission ([place]) 09/29/2014  . Severe heroin dependence in sustained remission ([place]) 09/29/2014  . Opiate abuse, episodic ([place]) 09/29/2014  . Mild tetrahydrocannabinol (THC) abuse 09/29/2014  . Chronic pain syndrome 04/18/2014  . Primary osteoarthritis of left shoulder 04/18/2014  . Primary osteoarthritis of right knee 04/18/2014  . Primary osteoarthritis of left knee 04/18/2014  . HYPERTENSION, BENIGN ESSENTIAL 03/22/2010  . CONSTIPATION 03/22/2010  . HEPATITIS C 06/04/1995   Past Medical History:  Diagnosis Date  . Anxiety   . Arthritis   . Constipation   . COPD (chronic obstructive pulmonary disease) ([place])   . DDD (degenerative disc disease), cervical   . DDD (degenerative disc disease), lumbar   . Gout   . Heart murmur    probable bicuspid AV with mild AS, mild MR by 04/22/14 Echo (Dr. Montez Morita)  . Hepatitis C    treated 2016   . History of blood transfusion   . Hypertension   . Pneumonia 12/02/13    Family History  Problem Relation Age of Onset  . Heart disease Mother   . Kidney disease Mother   . Alcohol abuse Mother   . Breast cancer Cousin 52    Past Surgical History:  Procedure Laterality Date  . ABDOMINAL HYSTERECTOMY    . APPENDECTOMY    . BACK SURGERY    . CESAREAN SECTION     x 3  . COLONOSCOPY W/ POLYPECTOMY    . HARDWARE REMOVAL Left 12/26/2015   Procedure: REMOVAL K-WIRE LEFT SCAPULA;  Surgeon: Garald Balding, MD;  Location: [facility];  Service: Orthopedics;  Laterality: Left;  . INNER EAR SURGERY     blood  vessel  . TONSILLECTOMY    . TOTAL SHOULDER ARTHROPLASTY Left 12/27/2014   Procedure: TOTAL SHOULDER ARTHROPLASTY;  Surgeon: Garald Balding, MD;  Location: [facility];  Service: Orthopedics;  Laterality: Left;   Social History   Occupational History  . Occupation: retired  Tobacco Use  . Smoking status: Former Smoker    Packs/day: 1.00    Years: 49.00    Pack years: 49.00    Types: Cigarettes    Quit date: 2007    Years since quitting: 13.5  . Smokeless tobacco: Never Used  .  Tobacco comment: quit 2007  Substance and Sexual Activity  . Alcohol use: No  . Drug use: No    Types: Marijuana    Comment: quit marijuana 12/18. last used heroin and cocaine in 1992.  Smokes Marijuana once a week., 12/22/15- "2 weeks ago"  . Sexual activity: Not on file

## 2018-12-30 ENCOUNTER — Ambulatory Visit (INDEPENDENT_AMBULATORY_CARE_PROVIDER_SITE_OTHER): Payer: Medicare Other | Admitting: Surgery

## 2018-12-30 ENCOUNTER — Encounter: Payer: Self-pay | Admitting: Surgery

## 2018-12-30 ENCOUNTER — Other Ambulatory Visit: Payer: Self-pay

## 2018-12-30 VITALS — BP 153/105 | HR 94 | Ht 61.0 in | Wt 224.0 lb

## 2018-12-30 DIAGNOSIS — M5416 Radiculopathy, lumbar region: Secondary | ICD-10-CM | POA: Diagnosis not present

## 2018-12-30 DIAGNOSIS — M7061 Trochanteric bursitis, right hip: Secondary | ICD-10-CM | POA: Diagnosis not present

## 2018-12-30 NOTE — Progress Notes (Signed)
73 year old black female returns today with complaints of severe low back pain and right lower extremity radiculopathy.  She has had lumbar ESI with Dr. Ernestina Patches and also has had right greater trochanter bursa injection.  She has not had any relief.  She does complain of ongoing pain.  Has been given Percocet by her primary care physician and is requesting more pain medication today.    Exam Bilateral lumbar paraspinal tenderness.  Tender over the bilateral SI joints.  Marked tenderness over the right hip greater trochanter bursa.  Negative logroll.  Negative straight leg raise.  No focal motor deficits.   Plan Advised patient that she should continue getting pain medication from her primary care physician.  Recommend that she follow-up with Dr. Lorin Mercy next Tuesday to discuss surgical options.  If her pain worsens she should go to the emergency room to be evaluated.

## 2019-01-05 ENCOUNTER — Encounter: Payer: Self-pay | Admitting: Orthopaedic Surgery

## 2019-01-05 ENCOUNTER — Ambulatory Visit (INDEPENDENT_AMBULATORY_CARE_PROVIDER_SITE_OTHER): Payer: Medicare Other | Admitting: Orthopaedic Surgery

## 2019-01-05 VITALS — Ht 61.0 in | Wt 224.0 lb

## 2019-01-05 DIAGNOSIS — M545 Low back pain, unspecified: Secondary | ICD-10-CM

## 2019-01-05 NOTE — Addendum Note (Signed)
Addended by: Meyer Cory on: 01/05/2019 04:49 PM   Modules accepted: Orders

## 2019-01-05 NOTE — Progress Notes (Signed)
Office Visit Note   Patient: Ashley Riddle           Date of Birth: 08-04-1945           MRN: [id number] Visit Date: 01/05/2019              Requested by: Audley Hose, MD [place],  [place] [id number] PCP: Audley Hose, MD   Assessment & Plan: Visit Diagnoses:  1. Acute right-sided low back pain, unspecified whether sciatica present     Plan: No back pain medication given to her today.  We discussed with intermittent narcotic use she may have some suppression of endorphins and enkephalin's and I recommend she stay off all pain medication for a month or she considers pain clinic referral.  We will set her up for some physical therapy since she is significantly deconditioned.  If she would like to proceed with facet injection to see if this is a source of her pain we could consider this on the right at the L4-5 level with Dr. Ernestina Patches she will call if she like to proceed.  Physical therapy referral given.  Follow-Up Instructions: No follow-ups on file.   Orders:  No orders of the defined types were placed in this encounter.  No orders of the defined types were placed in this encounter.     Procedures: No procedures performed   Clinical Data: No additional findings.   Subjective: Chief Complaint  Patient presents with  . Lower Back - Pain, Follow-up    HPI 73 year old female returns with ongoing problems with back pain more right hip pain that radiates down to her ankle than left.  She is having trouble ambulating getting from sitting to standing and has abnormal wide-based gait wobbling some with a "Osie Bond" type gait.  Patient's been on pain medication off and on mostly small amounts for the last 2 years.  Some Tylenol 3 some Norco some Percocet.  She states her PCP is recommended pain clinic.  She is tried a transforaminal epidural without relief.  She has degenerative facet worse on the right than left at L4-5 and I had  recommended she try a single facet injection and she is hesitant due to the pain she had with the previous injection.  Previous right L4-5 surgery many years ago.  Patient states pain  Reports pain brings her to tears.  She has some cirrhosis of her liver states she has been cured from hepatitis C treatment.  Past history of cocaine dependence.  Mild THC abuse.  Opioid abuse episodic.  Patient had an epidural which did not help trochanteric injection helped for a week or so in the right.  MRI the pelvis also MRI of the back were reviewed on today's visit.  Currently she is on oxycodone 5 mg.  Review of Systems View of systems updated unchanged from last office visit.  Objective: Vital Signs: Ht 5\' 1"  (1.549 m)   Wt 224 lb (101.6 kg)   BMI 42.32 kg/m   Physical Exam Constitutional:      Appearance: She is well-developed.  HENT:     Head: Normocephalic.     Right Ear: External ear normal.     Left Ear: External ear normal.  Eyes:     Pupils: Pupils are equal, round, and reactive to light.  Neck:     Thyroid: No thyromegaly.     Trachea: No tracheal deviation.  Cardiovascular:     Rate  and Rhythm: Normal rate.  Pulmonary:     Effort: Pulmonary effort is normal.  Abdominal:     Palpations: Abdomen is soft.  Skin:    General: Skin is warm and dry.  Neurological:     Mental Status: She is alert and oriented to person, place, and time.  Psychiatric:        Behavior: Behavior normal.     Ortho Exam negative straight leg raising 90 degrees no pain with hip range of motion.  Slightly more sciatic notch tenderness on the right and trochanteric tenderness on the right.  No left side sciatic notch trochanteric tenderness.  Negative popliteal compression test.  Lumbar spine incision is well-healed.  Specialty Comments:  No specialty comments available.  Imaging: No results found.   PMFS History: Patient Active Problem List   Diagnosis Date Noted  . Trochanteric bursitis, right  hip 12/15/2018  . Acute right-sided low back pain 10/06/2018  . COPD with acute exacerbation ([place]) 04/20/2018  . Anxiety and depression 04/20/2018  . CAP (community acquired pneumonia) 02/18/2017  . Liver cirrhosis ([place]) 02/18/2017  . Leukocytosis 02/18/2017  . Sinus tachycardia 02/18/2017  . Tachypnea 02/18/2017  . RLL pneumonia ([place]) 02/18/2017  . Increased ammonia level 02/18/2017  . Retained metal fragment left scapula 12/26/2015  . Fracture of glenoid process of left scapula 12/29/2014  . Status post total shoulder arthroplasty 12/27/2014  . Cocaine dependence in remission ([place]) 09/29/2014  . Severe heroin dependence in sustained remission ([place]) 09/29/2014  . Opiate abuse, episodic ([place]) 09/29/2014  . Mild tetrahydrocannabinol (THC) abuse 09/29/2014  . Chronic pain syndrome 04/18/2014  . Primary osteoarthritis of left shoulder 04/18/2014  . Primary osteoarthritis of right knee 04/18/2014  . Primary osteoarthritis of left knee 04/18/2014  . HYPERTENSION, BENIGN ESSENTIAL 03/22/2010  . CONSTIPATION 03/22/2010  . HEPATITIS C 06/04/1995   Past Medical History:  Diagnosis Date  . Anxiety   . Arthritis   . Constipation   . COPD (chronic obstructive pulmonary disease) ([employer])   . DDD (degenerative disc disease), cervical   . DDD (degenerative disc disease), lumbar   . Gout   . Heart murmur    probable bicuspid AV with mild AS, mild MR by 04/22/14 Echo (Dr. Montez Morita)  . Hepatitis C    treated 2016   . History of blood transfusion   . Hypertension   . Pneumonia 12/02/13    Family History  Problem Relation Age of Onset  . Heart disease Mother   . Kidney disease Mother   . Alcohol abuse Mother   . Breast cancer Cousin 24    Past Surgical History:  Procedure Laterality Date  . ABDOMINAL HYSTERECTOMY    . APPENDECTOMY    . BACK SURGERY    . CESAREAN SECTION     x 3  . COLONOSCOPY W/ POLYPECTOMY    . HARDWARE REMOVAL Left 12/26/2015   Procedure: REMOVAL K-WIRE LEFT  SCAPULA;  Surgeon: Garald Balding, MD;  Location: [facility];  Service: Orthopedics;  Laterality: Left;  . INNER EAR SURGERY     blood vessel  . TONSILLECTOMY    . TOTAL SHOULDER ARTHROPLASTY Left 12/27/2014   Procedure: TOTAL SHOULDER ARTHROPLASTY;  Surgeon: Garald Balding, MD;  Location: [facility];  Service: Orthopedics;  Laterality: Left;   Social History   Occupational History  . Occupation: retired  Tobacco Use  . Smoking status: Former Smoker    Packs/day: 1.00    Years: 49.00    Pack years:  49.00    Types: Cigarettes    Quit date: 2007    Years since quitting: 13.6  . Smokeless tobacco: Never Used  . Tobacco comment: quit 2007  Substance and Sexual Activity  . Alcohol use: No  . Drug use: No    Types: Marijuana    Comment: quit marijuana 12/18. last used heroin and cocaine in 1992.  Smokes Marijuana once a week., 12/22/15- "2 weeks ago"  . Sexual activity: Not on file

## 2019-01-19 ENCOUNTER — Ambulatory Visit: Payer: Medicare Other | Admitting: Physical Therapy

## 2019-01-25 ENCOUNTER — Ambulatory Visit
Admission: RE | Admit: 2019-01-25 | Discharge: 2019-01-25 | Disposition: A | Discharge status: Home / Self Care | Payer: Medicare Other | Source: Ambulatory Visit | Attending: Internal Medicine | Admitting: Internal Medicine

## 2019-01-25 ENCOUNTER — Other Ambulatory Visit: Payer: Self-pay

## 2019-01-25 DIAGNOSIS — Z1231 Encounter for screening mammogram for malignant neoplasm of breast: Secondary | ICD-10-CM

## 2019-01-26 ENCOUNTER — Ambulatory Visit: Payer: Medicare Other | Attending: Orthopaedic Surgery | Admitting: Physical Therapy

## 2019-01-26 DIAGNOSIS — G8929 Other chronic pain: Secondary | ICD-10-CM | POA: Diagnosis present

## 2019-01-26 DIAGNOSIS — M6281 Muscle weakness (generalized): Secondary | ICD-10-CM | POA: Insufficient documentation

## 2019-01-26 DIAGNOSIS — M25562 Pain in left knee: Secondary | ICD-10-CM | POA: Diagnosis present

## 2019-01-26 DIAGNOSIS — R262 Difficulty in walking, not elsewhere classified: Secondary | ICD-10-CM

## 2019-01-26 DIAGNOSIS — M25561 Pain in right knee: Secondary | ICD-10-CM | POA: Insufficient documentation

## 2019-01-26 DIAGNOSIS — M5440 Lumbago with sciatica, unspecified side: Secondary | ICD-10-CM | POA: Diagnosis present

## 2019-01-26 NOTE — Patient Instructions (Signed)
      Keeping back flat and feet together, rotate knees to left side. Hold __10__ seconds. Repeat __5__ times per set. Do __2__ sets per session. Do __1-2__ sessions per day.      Copyright  VHI. All rights reserved.  Straight Leg Calf Stretch ([place])   Put palms against wall, one leg forward and bent. With other leg back straight and heel flat on floor, lean into wall. Hold __20-30__ seconds. Change legs and repeat. Repeat _3___ times. Do _1-2___ sessions per day.  Voncille Lo, PT Certified Exercise Expert for the Aging Adult  01/26/19 3:33 PM Phone: [phone] Fax: [phone]

## 2019-01-26 NOTE — Therapy (Addendum)
[place], [place], [address] Phone: [phone]   Fax:  [phone]  Physical Therapy Evaluation/Discharge NOte  Patient Details  Name: Ashley Riddle MRN: [id number] Date of Birth: Mar 25, 1946 Referring Provider (PT): Rodell Perna MD   Encounter Date: 01/26/2019  PT End of Session - 01/26/19 1751    Visit Number  1    Number of Visits  17    Date for PT Re-Evaluation  03/23/19    Authorization Type  [employer] Medicare  Progress note every 10th visit    Authorization - Visit Number  1    Authorization - Number of Visits  17    PT Start Time  1500    PT Stop Time  1545    PT Time Calculation (min)  45 min    Activity Tolerance  Patient limited by pain;Patient limited by fatigue    Behavior During Therapy  [facility] for tasks assessed/performed       Past Medical History:  Diagnosis Date  . Anxiety   . Arthritis   . Constipation   . COPD (chronic obstructive pulmonary disease) ([place])   . DDD (degenerative disc disease), cervical   . DDD (degenerative disc disease), lumbar   . Gout   . Heart murmur    probable bicuspid AV with mild AS, mild MR by 04/22/14 Echo (Dr. Montez Morita)  . Hepatitis C    treated 2016   . History of blood transfusion   . Hypertension   . Pneumonia 12/02/13    Past Surgical History:  Procedure Laterality Date  . ABDOMINAL HYSTERECTOMY    . APPENDECTOMY    . BACK SURGERY    . CESAREAN SECTION     x 3  . COLONOSCOPY W/ POLYPECTOMY    . HARDWARE REMOVAL Left 12/26/2015   Procedure: REMOVAL K-WIRE LEFT SCAPULA;  Surgeon: Garald Balding, MD;  Location: [facility];  Service: Orthopedics;  Laterality: Left;  . INNER EAR SURGERY     blood vessel  . TONSILLECTOMY    . TOTAL SHOULDER ARTHROPLASTY Left 12/27/2014   Procedure: TOTAL SHOULDER ARTHROPLASTY;  Surgeon: Garald Balding, MD;  Location: [facility];  Service: Orthopedics;  Laterality: Left;    There were no vitals filed for this  visit.   Subjective Assessment - 01/26/19 1505    Subjective  I have had back pain for about 9 months,  I have been getting shots in my knees for a while too.  I dont use a walker or a cane because I dont want to be dependent. Dr Maeola Sarah at [facility] in [facility]    Pertinent History  Hepatitis, COPD, arthritis,    Limitations  Sitting;Standing;Walking;House hold activities    How long can you sit comfortably?  10 minutes    How long can you stand comfortably?  20 minutes    How long can you walk comfortably?  20 minutes    Diagnostic tests  x ray    Patient Stated Goals  I am hoping to relieve stress /pain, be able to stoop down and pick up things from the ground, household chores like vacuum,  go up and down steps safely,  get better endurance    Currently in Pain?  Yes    Pain Score  9     Pain Location  Back    Pain Descriptors / Indicators  Sharp    Pain Type  Chronic pain    Pain Onset  More than a month ago    Pain Frequency  Constant    Aggravating Factors   walking, sitting on commode, sleeping at night turning at night.    Pain Relieving Factors  medicine    Multiple Pain Sites  Yes    Pain Score  10    Pain Location  Knee    Pain Orientation  Left    Pain Descriptors / Indicators  Aching;Sore;Sharp    Pain Type  Chronic pain    Pain Score  8    Pain Location  Knee    Pain Orientation  Right    Pain Descriptors / Indicators  Aching    Pain Type  Chronic pain         [facility] PT Assessment - 01/26/19 1500      Assessment   Medical Diagnosis  Acute right side low back pain    Referring Provider (PT)  Rodell Perna MD    Onset Date/Surgical Date  03/10/18   about 9 months    Hand Dominance  Right    Prior Therapy  none  doesnt remember      Precautions   Precautions  None      Restrictions   Weight Bearing Restrictions  No      Balance Screen   Has the patient fallen in the past 6 months  No    Has the patient had a decrease in activity level because  of a fear of falling?   No    Is the patient reluctant to leave their home because of a fear of falling?   No      Home Environment   Living Environment  Private residence    Living Arrangements  Alone    Type of [place] to enter    Entrance Stairs-Number of Steps  0      Prior Function   Level of Independence  Independent    Vocation  Retired      Associate Professor   Overall Cognitive Status  Within Functional Limits for tasks assessed      Observation/Other Assessments   Focus on Therapeutic Outcomes (FOTO)   FOTO intake 38% limitation 62%  precited 53%      Posture/Postural Control   Posture/Postural Control  Postural limitations    Postural Limitations  Rounded Shoulders;Forward head      ROM / Strength   AROM / PROM / Strength  AROM;Strength      AROM   Overall AROM   Deficits    Lumbar Flexion  40   pain begins at 20 degrees flexion   Lumbar Extension  10    Lumbar - Right Side Bend  10   pain   Lumbar - Left Side Bend  15    Lumbar - Right Rotation  25% available range    Lumbar - Left Rotation  25% available range      Strength   Overall Strength  Deficits    Right Hip Flexion  4-/5    Right Hip Extension  3-/5    Right Hip ABduction  3-/5    Left Hip Flexion  4-/5    Left Hip Extension  3-/5    Left Hip ABduction  3-/5    Right Knee Flexion  4-/5    Right Knee Extension  3+/5    Left Knee Flexion  4-/5    Left Knee Extension  3-/5  Palpation   Palpation comment  Pt with tendernes over right Quadrtus lumborum and low back, Pt with tenderness over bilateral knees /patellar tendon      Transfers   Five time sit to stand comments   50.35 sec  uses momentum and trunk lean and must use hands to descend last time      Ambulation/Gait   Assistive device  None   suggested use of cane for safety   Gait Pattern  Antalgic;Lateral trunk lean to left;Wide base of support;Decreased hip/knee flexion - right;Decreased dorsiflexion - right     Ambulation Surface  Level    Gait velocity  1.67 ft/sec        there EX,  gastroc stretch 3 x 30 sec stretch right and left with wall for UE support Total joint assist exericse using wall forsafety and thoracic extension.  Trunk rotation  5 x 10 sec hold to right and to the left         Objective measurements completed on examination: See above findings.              PT Education - 01/26/19 1530    Education Details  POC Explanation of finding, intial HEP    Person(s) Educated  Patient    Methods  Explanation;Demonstration;Tactile cues;Verbal cues;Handout    Comprehension  Verbalized understanding;Returned demonstration       PT Short Term Goals - 01/26/19 1800      PT SHORT TERM GOAL #1   Title  Pt will be independent with intial HEP    Time  3    Period  Weeks    Status  New    Target Date  02/16/19      PT SHORT TERM GOAL #2   Title  Pt will increase LE strength in order to demonstrate improved 5 STS    Baseline  50 seconds   ( decreased risk of fall below 13 seconds)    Time  3    Period  Weeks    Status  New    Target Date  02/16/19        PT Long Term Goals - 01/26/19 1534      PT LONG TERM GOAL #1   Title  Demonstrate and verbalize techniques to reduce the risk of re-injury including: lifting, posture, body mechanics.    Time  8    Period  Weeks    Status  New    Target Date  03/23/19      PT LONG TERM GOAL #2   Title  Pt will be independent with advanced HEP.    Time  8    Period  Weeks    Status  New    Target Date  03/23/19      PT LONG TERM GOAL #3   Title  FOTO will improve from 62% limitation   to 53%limitation    indicating improved functional mobility .    Baseline  62% limitaiton eval    Time  8    Period  Weeks    Status  New    Target Date  03/23/19      PT LONG TERM GOAL #4   Title  Pt will improve hip/ knee extensor/flexor strength to >/= 4/5 with </= 2/10 pain to promote safety with walking/standing activities     Time  8    Period  Weeks    Status  New    Target Date  03/23/19  PT LONG TERM GOAL #5   Title  Pt will  be able to walk/stand >/= 45 minuteswith no AD with </= 2/10 pain in order to incorporate a walking program in to her daily life    Baseline  pt with no exericise    Time  8    Period  Weeks    Status  New    Target Date  03/23/19             Plan - 01/26/19 1753    Clinical Impression Statement  73 yo female enters clinis with wide based antagic gait and no assistive device and complaints of increased right sided back pain and bilateral knee pain for past 9 months. Pt presents with signs and symptoms compatible with lumbar stenosis and weakness/decreased ROM, pain and difficulty walking. . Pt would benefit from skilled PT for 2 times a week for 8 weeks to address above impariments and functional limitations and maximie function in order to remain independent.  Pt is also seeing a pain management specialist in [place]   Personal Factors and Comorbidities  Age;Comorbidity 1;Comorbidity 2;Comorbidity 3+;Education    Comorbidities  Gout, DDD, COPD,arthritis, hepatitis bilateral knee pain    Examination-Activity Limitations  Bathing;Bed Mobility;Dressing;Lift;Locomotion Level;Reach Overhead;Sit;Sleep;Squat;Stairs;Stand;Toileting;Transfers    Examination-Participation Restrictions  Church;Cleaning;Community Activity;Driving;Laundry;Meal Prep    Clinical Decision Making  Moderate    Rehab Potential  Good    PT Frequency  2x / week    PT Duration  8 weeks    PT Treatment/Interventions  ADLs/Self Care Home Management;Cryotherapy;Electrical Stimulation;Iontophoresis 72m/ml Dexamethasone;Moist Heat;Ultrasound;Gait training;Stair training;Functional mobility training;Therapeutic activities;Therapeutic exercise;Neuromuscular re-education;Manual techniques;Patient/family education;Passive range of motion;Dry needling;Taping;Joint Manipulations    PT Next Visit Plan  Basic back program,   walking program, sleep hygiene, ADL    PT Home Exercise Plan  trunk rotation, gastroc stretch and total joint assist    Consulted and Agree with Plan of Care  Patient       Patient will benefit from skilled therapeutic intervention in order to improve the following deficits and impairments:  Abnormal gait, Pain, Obesity, Improper body mechanics, Postural dysfunction, Impaired flexibility, Decreased strength, Hypomobility, Decreased range of motion, Difficulty walking, Decreased activity tolerance, Decreased mobility  Visit Diagnosis: Acute right-sided low back pain with sciatica, sciatica laterality unspecified  Muscle weakness (generalized)  Difficulty in walking, not elsewhere classified  Chronic pain of left knee  Chronic pain of right knee     Problem List Patient Active Problem List   Diagnosis Date Noted  . Trochanteric bursitis, right hip 12/15/2018  . Acute right-sided low back pain 10/06/2018  . COPD with acute exacerbation ([place] 04/20/2018  . Anxiety and depression 04/20/2018  . CAP (community acquired pneumonia) 02/18/2017  . Liver cirrhosis ([facility] 02/18/2017  . Leukocytosis 02/18/2017  . Sinus tachycardia 02/18/2017  . Tachypnea 02/18/2017  . RLL pneumonia ([facility] 02/18/2017  . Increased ammonia level 02/18/2017  . Retained metal fragment left scapula 12/26/2015  . Fracture of glenoid process of left scapula 12/29/2014  . Status post total shoulder arthroplasty 12/27/2014  . Cocaine dependence in remission ([place] 09/29/2014  . Severe heroin dependence in sustained remission ([place] 09/29/2014  . Opiate abuse, episodic ([facility] 09/29/2014  . Mild tetrahydrocannabinol (THC) abuse 09/29/2014  . Chronic pain syndrome 04/18/2014  . Primary osteoarthritis of left shoulder 04/18/2014  . Primary osteoarthritis of right knee 04/18/2014  . Primary osteoarthritis of left knee 04/18/2014  . HYPERTENSION, BENIGN ESSENTIAL 03/22/2010  . CONSTIPATION 03/22/2010  . HEPATITIS C  06/04/1995    Voncille Lo, PT Certified Exercise Expert for the Aging Adult  01/26/19 6:12 PM Phone: [phone] Fax: [place] [facility] [address] [place], [place], [address] Phone: [phone]   Fax:  [phone]  Name: Ashley Riddle MRN: [id number] Date of Birth: 07-28-1945   PHYSICAL THERAPY DISCHARGE SUMMARY  Visits from Start of Care: 1  Current functional level related to goals / functional outcomes: unknown   Remaining deficits: unknown   Education / Equipment: intiial education/hep Plan:                                                    Patient goals were not met. Patient is being discharged due to not returning since the last visit.  ?????    Voncille Lo, PT Certified Exercise Expert for the Aging Adult  03/16/19 4:46 PM Phone: [phone] Fax: [phone]

## 2019-02-02 ENCOUNTER — Other Ambulatory Visit: Payer: Self-pay | Admitting: Nurse Practitioner

## 2019-02-02 DIAGNOSIS — K7469 Other cirrhosis of liver: Secondary | ICD-10-CM

## 2019-02-10 ENCOUNTER — Ambulatory Visit
Admission: RE | Admit: 2019-02-10 | Discharge: 2019-02-10 | Disposition: A | Discharge status: Home / Self Care | Payer: Medicare Other | Source: Ambulatory Visit | Attending: Nurse Practitioner | Admitting: Nurse Practitioner

## 2019-02-10 DIAGNOSIS — K7469 Other cirrhosis of liver: Secondary | ICD-10-CM

## 2019-02-11 ENCOUNTER — Ambulatory Visit: Payer: Medicare Other | Admitting: Physical Therapy

## 2019-02-12 ENCOUNTER — Ambulatory Visit: Payer: Medicare Other | Admitting: Physical Therapy

## 2019-03-23 DIAGNOSIS — Z79899 Other long term (current) drug therapy: Secondary | ICD-10-CM | POA: Insufficient documentation

## 2019-06-01 ENCOUNTER — Ambulatory Visit: Payer: Medicare Other | Attending: Internal Medicine

## 2019-06-01 DIAGNOSIS — Z20822 Contact with and (suspected) exposure to covid-19: Secondary | ICD-10-CM

## 2019-06-02 LAB — NOVEL CORONAVIRUS, NAA: SARS-CoV-2, NAA: NOT DETECTED

## 2019-07-29 ENCOUNTER — Other Ambulatory Visit: Payer: Self-pay | Admitting: Nurse Practitioner

## 2019-07-29 DIAGNOSIS — K7469 Other cirrhosis of liver: Secondary | ICD-10-CM

## 2019-08-03 ENCOUNTER — Ambulatory Visit
Admission: RE | Admit: 2019-08-03 | Discharge: 2019-08-03 | Disposition: A | Discharge status: Home / Self Care | Payer: Medicare Other | Source: Ambulatory Visit | Attending: Nurse Practitioner | Admitting: Nurse Practitioner

## 2019-08-03 DIAGNOSIS — K7469 Other cirrhosis of liver: Secondary | ICD-10-CM

## 2019-08-03 DIAGNOSIS — K7460 Unspecified cirrhosis of liver: Secondary | ICD-10-CM | POA: Diagnosis not present

## 2019-08-07 ENCOUNTER — Ambulatory Visit: Payer: Medicare Other | Attending: Internal Medicine

## 2019-08-07 DIAGNOSIS — Z23 Encounter for immunization: Secondary | ICD-10-CM | POA: Insufficient documentation

## 2019-08-07 NOTE — Progress Notes (Signed)
   Covid-19 Vaccination Clinic  Name:  Ashley Riddle    MRN: [id number] DOB: 25-Aug-1945  08/07/2019  Ms. Ashley Riddle was observed post Covid-19 immunization for 15 minutes without incident. She was provided with Vaccine Information Sheet and instruction to access the V-Safe system.   Ms. Ashley Riddle was instructed to call 911 with any severe reactions post vaccine: Marland Kitchen Difficulty breathing  . Swelling of face and throat  . A fast heartbeat  . A bad rash all over body  . Dizziness and weakness   Immunizations Administered    Name Date Dose VIS Date Route   Pfizer COVID-19 Vaccine 08/07/2019 12:25 PM 0.3 mL 05/14/2019 Intramuscular   Manufacturer: [place]   Lot: [id number]   [place]: [id number]

## 2019-08-17 DIAGNOSIS — D6489 Other specified anemias: Secondary | ICD-10-CM | POA: Diagnosis not present

## 2019-08-17 DIAGNOSIS — J410 Simple chronic bronchitis: Secondary | ICD-10-CM | POA: Diagnosis not present

## 2019-08-17 DIAGNOSIS — I10 Essential (primary) hypertension: Secondary | ICD-10-CM | POA: Diagnosis not present

## 2019-08-17 DIAGNOSIS — R7303 Prediabetes: Secondary | ICD-10-CM | POA: Diagnosis not present

## 2019-08-17 DIAGNOSIS — I739 Peripheral vascular disease, unspecified: Secondary | ICD-10-CM | POA: Diagnosis not present

## 2019-08-19 DIAGNOSIS — R7303 Prediabetes: Secondary | ICD-10-CM | POA: Diagnosis not present

## 2019-08-19 DIAGNOSIS — D6489 Other specified anemias: Secondary | ICD-10-CM | POA: Diagnosis not present

## 2019-08-28 ENCOUNTER — Ambulatory Visit: Payer: Medicare Other | Attending: Internal Medicine

## 2019-08-28 DIAGNOSIS — Z23 Encounter for immunization: Secondary | ICD-10-CM

## 2019-08-28 NOTE — Progress Notes (Signed)
   Covid-19 Vaccination Clinic  Name:  Ashley Riddle    MRN: [id number] DOB: 1946/03/13  08/28/2019  [name] was observed post Covid-19 immunization for 15 minutes without incident. She was provided with Vaccine Information Sheet and instruction to access the V-Safe system.   [name] was instructed to call 911 with any severe reactions post vaccine: Marland Kitchen Difficulty breathing  . Swelling of face and throat  . A fast heartbeat  . A bad rash all over body  . Dizziness and weakness   Immunizations Administered    Name Date Dose VIS Date Route   Pfizer COVID-19 Vaccine 08/28/2019 12:39 PM 0.3 mL 05/14/2019 Intramuscular   Manufacturer: [place]   Lot: [id number]   [place]: [id number]

## 2019-09-09 DIAGNOSIS — G8929 Other chronic pain: Secondary | ICD-10-CM | POA: Diagnosis not present

## 2019-09-09 DIAGNOSIS — M25561 Pain in right knee: Secondary | ICD-10-CM | POA: Diagnosis not present

## 2019-09-09 DIAGNOSIS — M25562 Pain in left knee: Secondary | ICD-10-CM | POA: Diagnosis not present

## 2019-09-09 DIAGNOSIS — G894 Chronic pain syndrome: Secondary | ICD-10-CM | POA: Diagnosis not present

## 2019-09-09 DIAGNOSIS — Z79899 Other long term (current) drug therapy: Secondary | ICD-10-CM | POA: Diagnosis not present

## 2019-09-10 DIAGNOSIS — Z23 Encounter for immunization: Secondary | ICD-10-CM | POA: Diagnosis not present

## 2019-09-10 DIAGNOSIS — Z1211 Encounter for screening for malignant neoplasm of colon: Secondary | ICD-10-CM | POA: Diagnosis not present

## 2019-09-10 DIAGNOSIS — Z0001 Encounter for general adult medical examination with abnormal findings: Secondary | ICD-10-CM | POA: Diagnosis not present

## 2019-09-10 DIAGNOSIS — Z1231 Encounter for screening mammogram for malignant neoplasm of breast: Secondary | ICD-10-CM | POA: Diagnosis not present

## 2019-09-10 DIAGNOSIS — E1140 Type 2 diabetes mellitus with diabetic neuropathy, unspecified: Secondary | ICD-10-CM | POA: Diagnosis not present

## 2019-09-10 DIAGNOSIS — E1165 Type 2 diabetes mellitus with hyperglycemia: Secondary | ICD-10-CM | POA: Diagnosis not present

## 2019-09-10 DIAGNOSIS — I739 Peripheral vascular disease, unspecified: Secondary | ICD-10-CM | POA: Diagnosis not present

## 2019-09-10 DIAGNOSIS — Z13820 Encounter for screening for osteoporosis: Secondary | ICD-10-CM | POA: Diagnosis not present

## 2019-09-14 ENCOUNTER — Other Ambulatory Visit: Payer: Self-pay | Admitting: Internal Medicine

## 2019-09-14 DIAGNOSIS — Z1231 Encounter for screening mammogram for malignant neoplasm of breast: Secondary | ICD-10-CM

## 2019-09-29 DIAGNOSIS — H35363 Drusen (degenerative) of macula, bilateral: Secondary | ICD-10-CM | POA: Diagnosis not present

## 2019-09-29 DIAGNOSIS — H35033 Hypertensive retinopathy, bilateral: Secondary | ICD-10-CM | POA: Diagnosis not present

## 2019-09-29 DIAGNOSIS — E119 Type 2 diabetes mellitus without complications: Secondary | ICD-10-CM | POA: Diagnosis not present

## 2019-09-29 DIAGNOSIS — H2513 Age-related nuclear cataract, bilateral: Secondary | ICD-10-CM | POA: Diagnosis not present

## 2019-09-29 DIAGNOSIS — H524 Presbyopia: Secondary | ICD-10-CM | POA: Diagnosis not present

## 2019-10-01 DIAGNOSIS — Z79899 Other long term (current) drug therapy: Secondary | ICD-10-CM | POA: Diagnosis not present

## 2019-10-06 DIAGNOSIS — M1712 Unilateral primary osteoarthritis, left knee: Secondary | ICD-10-CM | POA: Diagnosis not present

## 2019-11-09 DIAGNOSIS — M25562 Pain in left knee: Secondary | ICD-10-CM | POA: Diagnosis not present

## 2019-11-09 DIAGNOSIS — G8929 Other chronic pain: Secondary | ICD-10-CM | POA: Diagnosis not present

## 2019-11-09 DIAGNOSIS — M25561 Pain in right knee: Secondary | ICD-10-CM | POA: Diagnosis not present

## 2019-11-09 DIAGNOSIS — G894 Chronic pain syndrome: Secondary | ICD-10-CM | POA: Diagnosis not present

## 2019-11-10 ENCOUNTER — Encounter: Payer: Self-pay | Admitting: Podiatry

## 2019-11-10 ENCOUNTER — Other Ambulatory Visit: Payer: Self-pay

## 2019-11-10 ENCOUNTER — Ambulatory Visit (INDEPENDENT_AMBULATORY_CARE_PROVIDER_SITE_OTHER): Payer: Medicare Other | Admitting: Podiatry

## 2019-11-10 DIAGNOSIS — L600 Ingrowing nail: Secondary | ICD-10-CM | POA: Diagnosis not present

## 2019-11-10 DIAGNOSIS — M79675 Pain in left toe(s): Secondary | ICD-10-CM | POA: Diagnosis not present

## 2019-11-10 DIAGNOSIS — M79674 Pain in right toe(s): Secondary | ICD-10-CM

## 2019-11-10 DIAGNOSIS — B351 Tinea unguium: Secondary | ICD-10-CM | POA: Diagnosis not present

## 2019-11-10 MED ORDER — NEOMYCIN-POLYMYXIN-HC 3.5-10000-1 OT SOLN: OTIC | 0 refills | Status: DC

## 2019-11-10 NOTE — Patient Instructions (Signed)

## 2019-11-10 NOTE — Progress Notes (Signed)
   Subjective:    Patient ID: Ashley Riddle, female    DOB: May 28, 1946, 74 y.o.   MRN: [id number]  HPI    Review of Systems  All other systems reviewed and are negative.      Objective:   Physical Exam        Assessment & Plan:

## 2019-11-11 NOTE — Progress Notes (Signed)
Subjective:   Patient ID: Ashley Riddle, female   DOB: 74 y.o.   MRN: [id number]   HPI Patient presents with a damaged right big toenail that she states is sore and she cannot cut or wear shoe gear comfortably.  Also complains of other nails being thickened and brittle and she cannot cut them.  She presents with caregiver who states she cannot take care of her nails also.  Patient does not smoke and does like to be active if possible   Review of Systems  All other systems reviewed and are negative.       Objective:  Physical Exam Vitals and nursing note reviewed.  Constitutional:      Appearance: She is well-developed.  Pulmonary:     Effort: Pulmonary effort is normal.  Musculoskeletal:        General: Normal range of motion.  Skin:    General: Skin is warm.  Neurological:     Mental Status: She is alert.     Neurovascular status was found to be intact with muscle strength mildly diminished and range of motion subtalar joint mildly diminished.  Patient is noted to have significant thickness of nailbeds 1 through 5 both feet with the right big toenail being dystrophic abnormal appearance with history that had been removed in the past.  It is loose and painful when pressed dorsally.  All remaining nails are very thick and brittle and she cannot cut them and they become painful in shoes.  Patient has good digital perfusion and is well oriented at this time     Assessment:  Damaged right hallux nail with previous treatment that was ineffective along with thick nailbeds of all remaining nails     Plan:  H&P reviewed conditions with patient and caregiver.  Patient has decided on nail removal and I explained procedure risk and gave her consent form which she signed understanding risk.  Today I infiltrated the right hallux 60 mg like Marcaine mixture sterile prep applied and using sterile instrumentation I removed the right hallux nail with exposed matrix and applied phenol 3  applications 30 seconds followed by alcohol lavage sterile dressing.  Gave instructions to leave dressing on 24 hours but take it off earlier if any throbbing were to occur wrote prescription for drops and encouraged her to contact us with any issues.  All remaining nails were debrided with no iatrogenic bleeding and she will reappoint for routine care of nail disease

## 2019-11-12 DIAGNOSIS — J449 Chronic obstructive pulmonary disease, unspecified: Secondary | ICD-10-CM | POA: Diagnosis not present

## 2019-11-12 DIAGNOSIS — Z01818 Encounter for other preprocedural examination: Secondary | ICD-10-CM | POA: Diagnosis not present

## 2019-11-12 DIAGNOSIS — E1165 Type 2 diabetes mellitus with hyperglycemia: Secondary | ICD-10-CM | POA: Diagnosis not present

## 2019-11-15 DIAGNOSIS — IMO0002 Reserved for concepts with insufficient information to code with codable children: Secondary | ICD-10-CM | POA: Insufficient documentation

## 2019-11-15 DIAGNOSIS — E119 Type 2 diabetes mellitus without complications: Secondary | ICD-10-CM | POA: Insufficient documentation

## 2019-11-18 IMAGING — MR MRI LUMBAR SPINE WITHOUT AND WITH CONTRAST
9 of 16 series · 28 of 48 positions shown · IV contrast (multihance)
Comparison: Abdominopelvic CT 02/18/2017.

CLINICAL DATA: Right-sided low back pain extending into the right
leg for 4 weeks. No known injury. Remote back surgery.

Creatinine was obtained on site at [HOSPITAL] at [HOSPITAL].
Results: Creatinine 0.5 mg/dL.
EXAM:
MRI LUMBAR SPINE WITHOUT AND WITH CONTRAST
TECHNIQUE: Multiplanar and multiecho pulse sequences of the lumbar spine were
obtained without and with intravenous contrast.
CONTRAST:  19mL MULTIHANCE GADOBENATE DIMEGLUMINE 529 MG/ML IV SOLN

[Series 4: T1 · sagittal · 4.0mm · 1.09mm/px · 2 of 14 slices shown (1 of 4)]
[im 1/14]
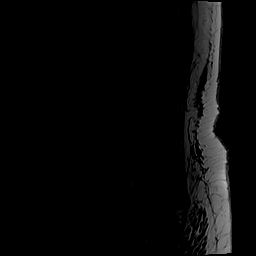
[im 14/14]
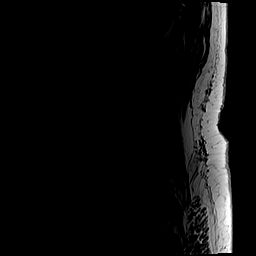

[Series 5: T1 · axial · 4.0mm · 0.78mm/px · z∈[-71,+125]mm · 4 of 35 slices shown (2 of 4)]
[im 1/35]
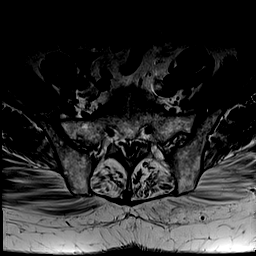
[im 12/35]
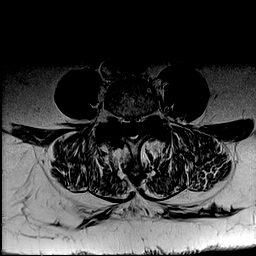
[im 23/35]
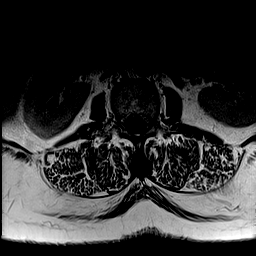
[im 35/35]
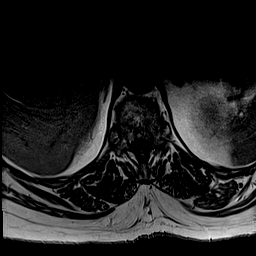

[Series 6: T2 · axial · 4.0mm · 0.78mm/px · z∈[-71,+125]mm · 3 of 35 slices shown]
[im 1/35]
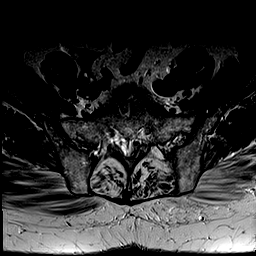
[im 18/35]
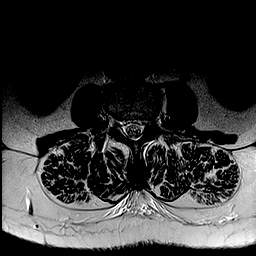
[im 35/35]
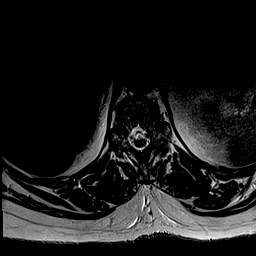

[Series 9: T1 · oblique · 3.0mm · 0.55mm/px · 2 of 24 slices shown (3 of 4)]
[im 1/24]
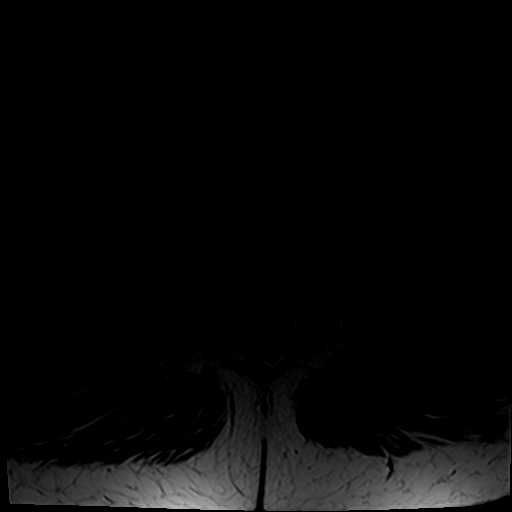
[im 24/24]
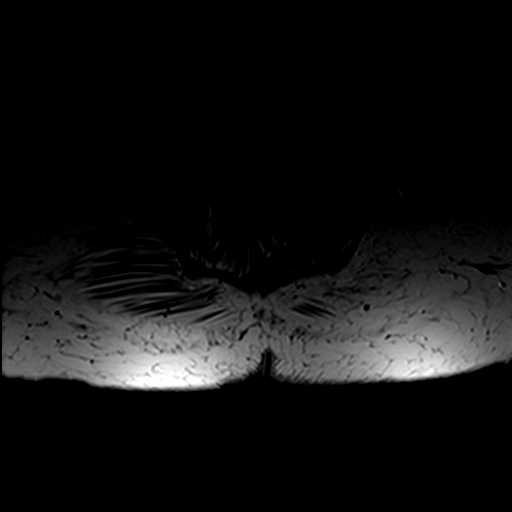

[Series 12: T1 · axial · 4.0mm · 0.59mm/px · z∈[-231,+4]mm · 5 of 48 slices shown (4 of 4)]
[im 1/48]
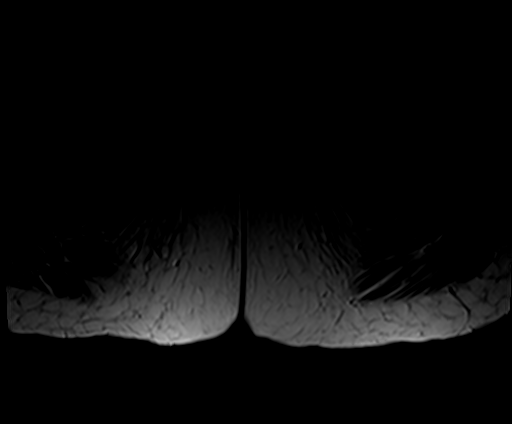
[im 12/48]
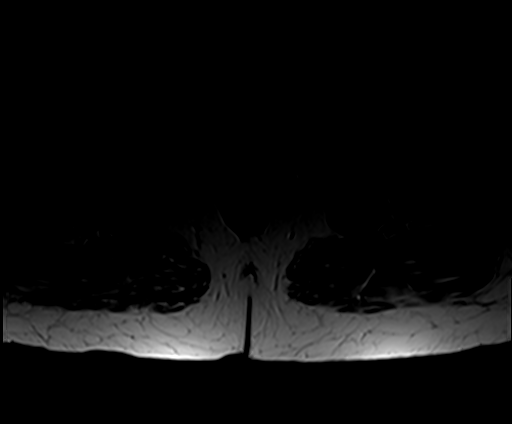
[im 24/48]
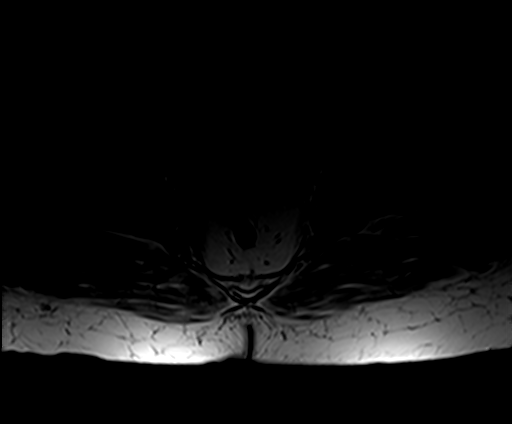
[im 36/48]
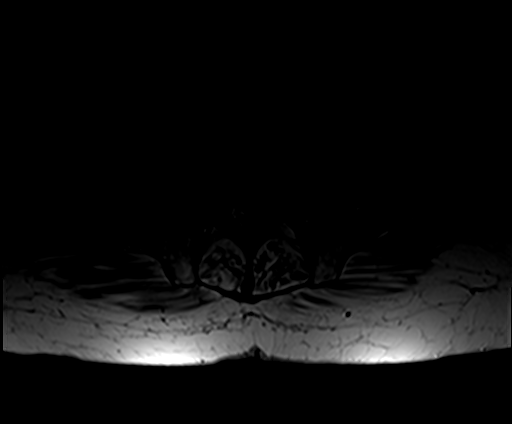
[im 48/48]
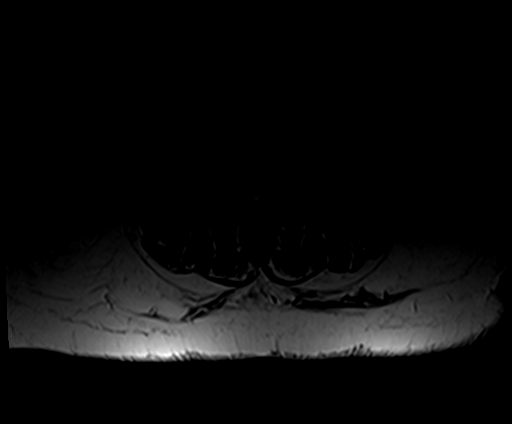

[Series 13: T1 fat-sat · axial · non-contrast · 4.0mm · 0.59mm/px · z∈[-231,+4]mm · 5 of 48 slices shown (1 of 2)]
[im 1/48]
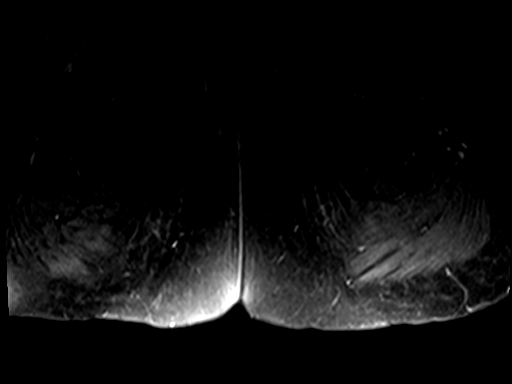
[im 12/48]
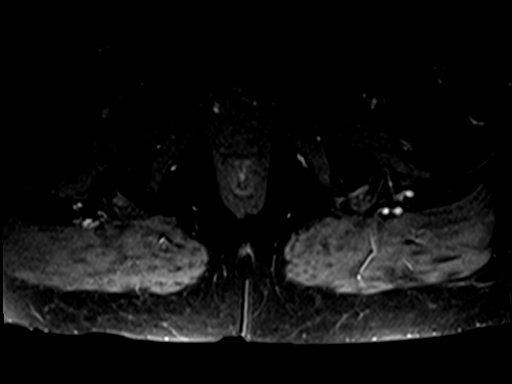
[im 24/48]
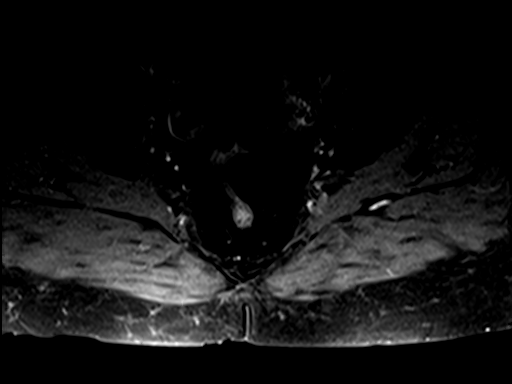
[im 36/48]
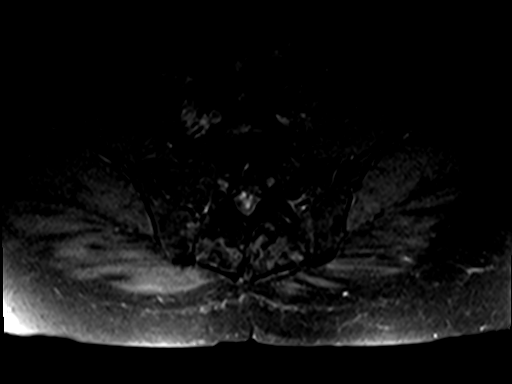
[im 48/48]
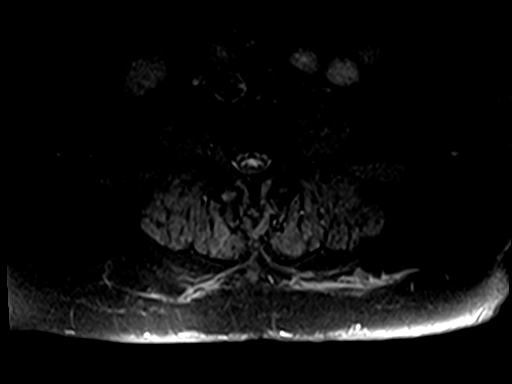

[Series 14: T1 fat-sat post-contrast · axial · 4.0mm · 0.59mm/px · z∈[-231,+4]mm · 5 of 48 slices shown]
[im 1/48]
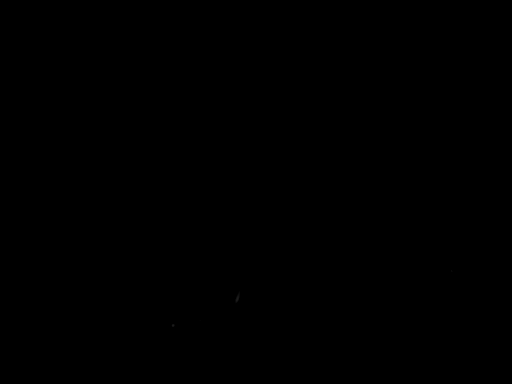
[im 12/48]
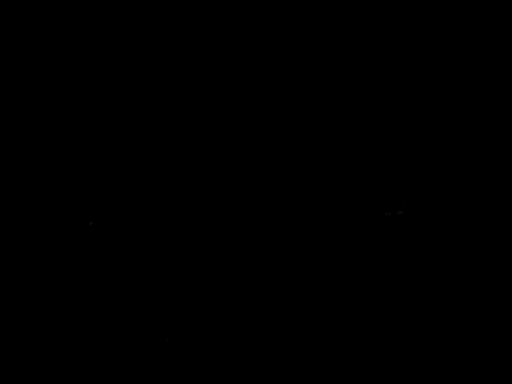
[im 24/48]
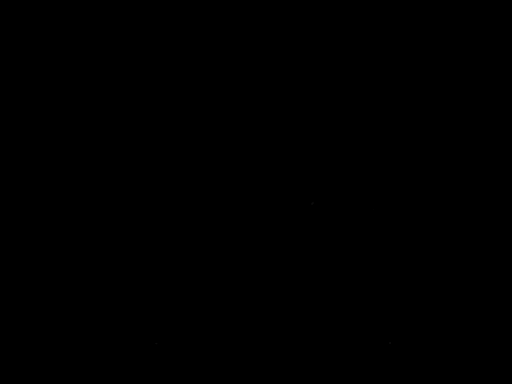
[im 36/48]
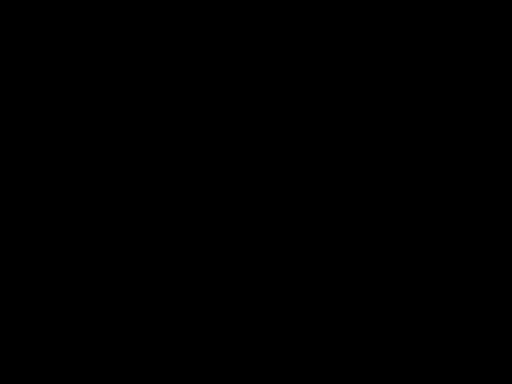
[im 48/48]
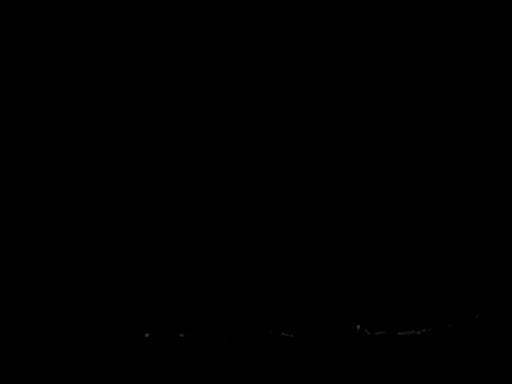

[Series 15: T1 fat-sat · oblique · 3.0mm · 0.55mm/px · 1 of 24 slices shown (2 of 2)]
[im 1/24]
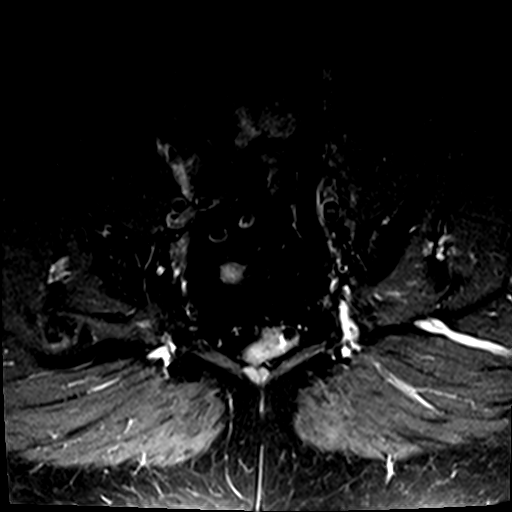

[Series 17: T2 post-contrast · sagittal · 4.0mm · 1.09mm/px · 1 of 14 slices shown]
[im 1/14]
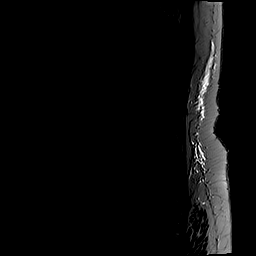

[28 of 48 positions shown; findings below may reference images not displayed]

FINDINGS: Segmentation: Conventional anatomy assumed, with the last open disc
space designated L5-S1.Concordant with previous imaging.

Alignment:  Normal.

Vertebrae: No worrisome osseous lesion, acute fracture or pars
defect. There is no abnormal osseous enhancement. The visualized
sacroiliac joints appear unremarkable.

Conus medullaris: Extends to the L1-2 level and appears normal. No
abnormal intradural enhancement.

Paraspinal and other soft tissues: No significant paraspinal
findings.

Disc levels:

Sagittal images demonstrate disc degeneration with annular disc
bulging at T10-11 and T11-12. There is no resulting cord deformity
or high-grade foraminal narrowing.

No significant disc space findings from T12-L1 through L2-3.

L3-4: Mild disc bulging, facet and ligamentous hypertrophy. Mild
foraminal narrowing bilaterally without nerve root encroachment.

L4-5: Probable right-sided laminotomy. Loss of disc height with
annular disc bulging. There is advanced asymmetric facet hypertrophy
on the right which contributes to asymmetric narrowing of the right
lateral recess. Both foramina are mildly narrowed. Overall, these
findings contribute to mild multifactorial spinal stenosis and
appear similar to previous CT.

L5-S1: Chronic loss of disc height with annular disc bulging, vacuum
phenomenon and endplate osteophytes. Mild bilateral facet
hypertrophy. Stable mild chronic narrowing of the lateral recesses
and foramina bilaterally.
IMPRESSION: 1. Mild multifactorial spinal stenosis at L4-5 with asymmetric
narrowing of the right lateral recess secondary to facet
hypertrophy. This may contribute to right L5 nerve root encroachment
and the patient's symptoms, although is similar in appearance to
abdominal CT from 02/18/2017.
[DATE]. Chronic narrowing of the lateral recesses and foramina
bilaterally at L5-S1 secondary to annular disc bulging, endplate
osteophytes and facet hypertrophy.
3. No acute findings or abnormal enhancement.

## 2019-11-22 DIAGNOSIS — Z8639 Personal history of other endocrine, nutritional and metabolic disease: Secondary | ICD-10-CM | POA: Diagnosis not present

## 2019-11-22 DIAGNOSIS — M1712 Unilateral primary osteoarthritis, left knee: Secondary | ICD-10-CM | POA: Diagnosis not present

## 2019-12-10 DIAGNOSIS — M1712 Unilateral primary osteoarthritis, left knee: Secondary | ICD-10-CM | POA: Diagnosis not present

## 2019-12-10 DIAGNOSIS — Z01818 Encounter for other preprocedural examination: Secondary | ICD-10-CM | POA: Diagnosis not present

## 2019-12-14 DIAGNOSIS — Z96652 Presence of left artificial knee joint: Secondary | ICD-10-CM | POA: Insufficient documentation

## 2019-12-14 DIAGNOSIS — M1712 Unilateral primary osteoarthritis, left knee: Secondary | ICD-10-CM | POA: Diagnosis not present

## 2019-12-14 DIAGNOSIS — R29898 Other symptoms and signs involving the musculoskeletal system: Secondary | ICD-10-CM | POA: Diagnosis not present

## 2019-12-14 DIAGNOSIS — M25662 Stiffness of left knee, not elsewhere classified: Secondary | ICD-10-CM | POA: Diagnosis not present

## 2019-12-14 DIAGNOSIS — R2681 Unsteadiness on feet: Secondary | ICD-10-CM | POA: Diagnosis not present

## 2019-12-14 DIAGNOSIS — J449 Chronic obstructive pulmonary disease, unspecified: Secondary | ICD-10-CM | POA: Diagnosis not present

## 2019-12-14 DIAGNOSIS — E119 Type 2 diabetes mellitus without complications: Secondary | ICD-10-CM | POA: Diagnosis not present

## 2019-12-14 DIAGNOSIS — Z471 Aftercare following joint replacement surgery: Secondary | ICD-10-CM | POA: Diagnosis not present

## 2019-12-15 DIAGNOSIS — J189 Pneumonia, unspecified organism: Secondary | ICD-10-CM | POA: Diagnosis not present

## 2019-12-15 DIAGNOSIS — J449 Chronic obstructive pulmonary disease, unspecified: Secondary | ICD-10-CM | POA: Diagnosis not present

## 2019-12-15 DIAGNOSIS — J441 Chronic obstructive pulmonary disease with (acute) exacerbation: Secondary | ICD-10-CM | POA: Diagnosis not present

## 2019-12-15 DIAGNOSIS — E119 Type 2 diabetes mellitus without complications: Secondary | ICD-10-CM | POA: Diagnosis not present

## 2019-12-15 DIAGNOSIS — R29898 Other symptoms and signs involving the musculoskeletal system: Secondary | ICD-10-CM | POA: Diagnosis not present

## 2019-12-15 DIAGNOSIS — M25662 Stiffness of left knee, not elsewhere classified: Secondary | ICD-10-CM | POA: Diagnosis not present

## 2019-12-15 DIAGNOSIS — R2681 Unsteadiness on feet: Secondary | ICD-10-CM | POA: Diagnosis not present

## 2019-12-15 DIAGNOSIS — M1712 Unilateral primary osteoarthritis, left knee: Secondary | ICD-10-CM | POA: Diagnosis not present

## 2019-12-16 DIAGNOSIS — M1712 Unilateral primary osteoarthritis, left knee: Secondary | ICD-10-CM | POA: Diagnosis not present

## 2019-12-16 DIAGNOSIS — E119 Type 2 diabetes mellitus without complications: Secondary | ICD-10-CM | POA: Diagnosis not present

## 2019-12-16 DIAGNOSIS — R29898 Other symptoms and signs involving the musculoskeletal system: Secondary | ICD-10-CM | POA: Diagnosis not present

## 2019-12-16 DIAGNOSIS — R2681 Unsteadiness on feet: Secondary | ICD-10-CM | POA: Diagnosis not present

## 2019-12-16 DIAGNOSIS — J449 Chronic obstructive pulmonary disease, unspecified: Secondary | ICD-10-CM | POA: Diagnosis not present

## 2019-12-16 DIAGNOSIS — M25662 Stiffness of left knee, not elsewhere classified: Secondary | ICD-10-CM | POA: Diagnosis not present

## 2019-12-17 DIAGNOSIS — M109 Gout, unspecified: Secondary | ICD-10-CM | POA: Diagnosis not present

## 2019-12-17 DIAGNOSIS — Z9181 History of falling: Secondary | ICD-10-CM | POA: Diagnosis not present

## 2019-12-17 DIAGNOSIS — Z471 Aftercare following joint replacement surgery: Secondary | ICD-10-CM | POA: Diagnosis not present

## 2019-12-17 DIAGNOSIS — Z7951 Long term (current) use of inhaled steroids: Secondary | ICD-10-CM | POA: Diagnosis not present

## 2019-12-17 DIAGNOSIS — Z96652 Presence of left artificial knee joint: Secondary | ICD-10-CM | POA: Diagnosis not present

## 2019-12-17 DIAGNOSIS — Z96612 Presence of left artificial shoulder joint: Secondary | ICD-10-CM | POA: Diagnosis not present

## 2019-12-17 DIAGNOSIS — Z7982 Long term (current) use of aspirin: Secondary | ICD-10-CM | POA: Diagnosis not present

## 2019-12-17 DIAGNOSIS — I10 Essential (primary) hypertension: Secondary | ICD-10-CM | POA: Diagnosis not present

## 2019-12-17 DIAGNOSIS — J449 Chronic obstructive pulmonary disease, unspecified: Secondary | ICD-10-CM | POA: Diagnosis not present

## 2019-12-17 DIAGNOSIS — E119 Type 2 diabetes mellitus without complications: Secondary | ICD-10-CM | POA: Diagnosis not present

## 2019-12-17 DIAGNOSIS — Z79891 Long term (current) use of opiate analgesic: Secondary | ICD-10-CM | POA: Diagnosis not present

## 2019-12-17 DIAGNOSIS — Z7984 Long term (current) use of oral hypoglycemic drugs: Secondary | ICD-10-CM | POA: Diagnosis not present

## 2019-12-20 DIAGNOSIS — Z7982 Long term (current) use of aspirin: Secondary | ICD-10-CM | POA: Diagnosis not present

## 2019-12-20 DIAGNOSIS — Z9181 History of falling: Secondary | ICD-10-CM | POA: Diagnosis not present

## 2019-12-20 DIAGNOSIS — M109 Gout, unspecified: Secondary | ICD-10-CM | POA: Diagnosis not present

## 2019-12-20 DIAGNOSIS — Z471 Aftercare following joint replacement surgery: Secondary | ICD-10-CM | POA: Diagnosis not present

## 2019-12-20 DIAGNOSIS — Z7984 Long term (current) use of oral hypoglycemic drugs: Secondary | ICD-10-CM | POA: Diagnosis not present

## 2019-12-20 DIAGNOSIS — J449 Chronic obstructive pulmonary disease, unspecified: Secondary | ICD-10-CM | POA: Diagnosis not present

## 2019-12-20 DIAGNOSIS — Z96612 Presence of left artificial shoulder joint: Secondary | ICD-10-CM | POA: Diagnosis not present

## 2019-12-20 DIAGNOSIS — Z79891 Long term (current) use of opiate analgesic: Secondary | ICD-10-CM | POA: Diagnosis not present

## 2019-12-20 DIAGNOSIS — Z96652 Presence of left artificial knee joint: Secondary | ICD-10-CM | POA: Diagnosis not present

## 2019-12-20 DIAGNOSIS — I10 Essential (primary) hypertension: Secondary | ICD-10-CM | POA: Diagnosis not present

## 2019-12-20 DIAGNOSIS — Z7951 Long term (current) use of inhaled steroids: Secondary | ICD-10-CM | POA: Diagnosis not present

## 2019-12-20 DIAGNOSIS — E119 Type 2 diabetes mellitus without complications: Secondary | ICD-10-CM | POA: Diagnosis not present

## 2019-12-22 DIAGNOSIS — E119 Type 2 diabetes mellitus without complications: Secondary | ICD-10-CM | POA: Diagnosis not present

## 2019-12-22 DIAGNOSIS — M109 Gout, unspecified: Secondary | ICD-10-CM | POA: Diagnosis not present

## 2019-12-22 DIAGNOSIS — Z7982 Long term (current) use of aspirin: Secondary | ICD-10-CM | POA: Diagnosis not present

## 2019-12-22 DIAGNOSIS — Z9181 History of falling: Secondary | ICD-10-CM | POA: Diagnosis not present

## 2019-12-22 DIAGNOSIS — I10 Essential (primary) hypertension: Secondary | ICD-10-CM | POA: Diagnosis not present

## 2019-12-22 DIAGNOSIS — Z471 Aftercare following joint replacement surgery: Secondary | ICD-10-CM | POA: Diagnosis not present

## 2019-12-22 DIAGNOSIS — Z79891 Long term (current) use of opiate analgesic: Secondary | ICD-10-CM | POA: Diagnosis not present

## 2019-12-22 DIAGNOSIS — J449 Chronic obstructive pulmonary disease, unspecified: Secondary | ICD-10-CM | POA: Diagnosis not present

## 2019-12-22 DIAGNOSIS — Z7951 Long term (current) use of inhaled steroids: Secondary | ICD-10-CM | POA: Diagnosis not present

## 2019-12-22 DIAGNOSIS — Z7984 Long term (current) use of oral hypoglycemic drugs: Secondary | ICD-10-CM | POA: Diagnosis not present

## 2019-12-22 DIAGNOSIS — Z96612 Presence of left artificial shoulder joint: Secondary | ICD-10-CM | POA: Diagnosis not present

## 2019-12-22 DIAGNOSIS — Z96652 Presence of left artificial knee joint: Secondary | ICD-10-CM | POA: Diagnosis not present

## 2019-12-24 DIAGNOSIS — Z96652 Presence of left artificial knee joint: Secondary | ICD-10-CM | POA: Diagnosis not present

## 2019-12-24 DIAGNOSIS — Z7984 Long term (current) use of oral hypoglycemic drugs: Secondary | ICD-10-CM | POA: Diagnosis not present

## 2019-12-24 DIAGNOSIS — I10 Essential (primary) hypertension: Secondary | ICD-10-CM | POA: Diagnosis not present

## 2019-12-24 DIAGNOSIS — M109 Gout, unspecified: Secondary | ICD-10-CM | POA: Diagnosis not present

## 2019-12-24 DIAGNOSIS — J449 Chronic obstructive pulmonary disease, unspecified: Secondary | ICD-10-CM | POA: Diagnosis not present

## 2019-12-24 DIAGNOSIS — Z96612 Presence of left artificial shoulder joint: Secondary | ICD-10-CM | POA: Diagnosis not present

## 2019-12-24 DIAGNOSIS — Z7982 Long term (current) use of aspirin: Secondary | ICD-10-CM | POA: Diagnosis not present

## 2019-12-24 DIAGNOSIS — Z9181 History of falling: Secondary | ICD-10-CM | POA: Diagnosis not present

## 2019-12-24 DIAGNOSIS — Z79891 Long term (current) use of opiate analgesic: Secondary | ICD-10-CM | POA: Diagnosis not present

## 2019-12-24 DIAGNOSIS — Z471 Aftercare following joint replacement surgery: Secondary | ICD-10-CM | POA: Diagnosis not present

## 2019-12-24 DIAGNOSIS — Z7951 Long term (current) use of inhaled steroids: Secondary | ICD-10-CM | POA: Diagnosis not present

## 2019-12-24 DIAGNOSIS — E119 Type 2 diabetes mellitus without complications: Secondary | ICD-10-CM | POA: Diagnosis not present

## 2019-12-27 DIAGNOSIS — Z471 Aftercare following joint replacement surgery: Secondary | ICD-10-CM | POA: Diagnosis not present

## 2019-12-27 DIAGNOSIS — Z7984 Long term (current) use of oral hypoglycemic drugs: Secondary | ICD-10-CM | POA: Diagnosis not present

## 2019-12-27 DIAGNOSIS — Z96612 Presence of left artificial shoulder joint: Secondary | ICD-10-CM | POA: Diagnosis not present

## 2019-12-27 DIAGNOSIS — J449 Chronic obstructive pulmonary disease, unspecified: Secondary | ICD-10-CM | POA: Diagnosis not present

## 2019-12-27 DIAGNOSIS — E119 Type 2 diabetes mellitus without complications: Secondary | ICD-10-CM | POA: Diagnosis not present

## 2019-12-27 DIAGNOSIS — M109 Gout, unspecified: Secondary | ICD-10-CM | POA: Diagnosis not present

## 2019-12-27 DIAGNOSIS — Z7982 Long term (current) use of aspirin: Secondary | ICD-10-CM | POA: Diagnosis not present

## 2019-12-27 DIAGNOSIS — Z79891 Long term (current) use of opiate analgesic: Secondary | ICD-10-CM | POA: Diagnosis not present

## 2019-12-27 DIAGNOSIS — I10 Essential (primary) hypertension: Secondary | ICD-10-CM | POA: Diagnosis not present

## 2019-12-27 DIAGNOSIS — Z96652 Presence of left artificial knee joint: Secondary | ICD-10-CM | POA: Diagnosis not present

## 2019-12-27 DIAGNOSIS — Z7951 Long term (current) use of inhaled steroids: Secondary | ICD-10-CM | POA: Diagnosis not present

## 2019-12-27 DIAGNOSIS — Z9181 History of falling: Secondary | ICD-10-CM | POA: Diagnosis not present

## 2019-12-31 DIAGNOSIS — M109 Gout, unspecified: Secondary | ICD-10-CM | POA: Diagnosis not present

## 2019-12-31 DIAGNOSIS — Z96652 Presence of left artificial knee joint: Secondary | ICD-10-CM | POA: Diagnosis not present

## 2019-12-31 DIAGNOSIS — E119 Type 2 diabetes mellitus without complications: Secondary | ICD-10-CM | POA: Diagnosis not present

## 2019-12-31 DIAGNOSIS — Z7984 Long term (current) use of oral hypoglycemic drugs: Secondary | ICD-10-CM | POA: Diagnosis not present

## 2019-12-31 DIAGNOSIS — Z96612 Presence of left artificial shoulder joint: Secondary | ICD-10-CM | POA: Diagnosis not present

## 2019-12-31 DIAGNOSIS — Z7982 Long term (current) use of aspirin: Secondary | ICD-10-CM | POA: Diagnosis not present

## 2019-12-31 DIAGNOSIS — Z9181 History of falling: Secondary | ICD-10-CM | POA: Diagnosis not present

## 2019-12-31 DIAGNOSIS — I10 Essential (primary) hypertension: Secondary | ICD-10-CM | POA: Diagnosis not present

## 2019-12-31 DIAGNOSIS — Z471 Aftercare following joint replacement surgery: Secondary | ICD-10-CM | POA: Diagnosis not present

## 2019-12-31 DIAGNOSIS — J449 Chronic obstructive pulmonary disease, unspecified: Secondary | ICD-10-CM | POA: Diagnosis not present

## 2019-12-31 DIAGNOSIS — Z7951 Long term (current) use of inhaled steroids: Secondary | ICD-10-CM | POA: Diagnosis not present

## 2019-12-31 DIAGNOSIS — Z79891 Long term (current) use of opiate analgesic: Secondary | ICD-10-CM | POA: Diagnosis not present

## 2020-01-04 ENCOUNTER — Other Ambulatory Visit: Payer: Self-pay | Admitting: Internal Medicine

## 2020-01-04 DIAGNOSIS — Z13820 Encounter for screening for osteoporosis: Secondary | ICD-10-CM

## 2020-01-05 DIAGNOSIS — I10 Essential (primary) hypertension: Secondary | ICD-10-CM | POA: Diagnosis not present

## 2020-01-05 DIAGNOSIS — Z96612 Presence of left artificial shoulder joint: Secondary | ICD-10-CM | POA: Diagnosis not present

## 2020-01-05 DIAGNOSIS — E119 Type 2 diabetes mellitus without complications: Secondary | ICD-10-CM | POA: Diagnosis not present

## 2020-01-05 DIAGNOSIS — J449 Chronic obstructive pulmonary disease, unspecified: Secondary | ICD-10-CM | POA: Diagnosis not present

## 2020-01-05 DIAGNOSIS — M109 Gout, unspecified: Secondary | ICD-10-CM | POA: Diagnosis not present

## 2020-01-05 DIAGNOSIS — Z9181 History of falling: Secondary | ICD-10-CM | POA: Diagnosis not present

## 2020-01-05 DIAGNOSIS — Z7982 Long term (current) use of aspirin: Secondary | ICD-10-CM | POA: Diagnosis not present

## 2020-01-05 DIAGNOSIS — Z7951 Long term (current) use of inhaled steroids: Secondary | ICD-10-CM | POA: Diagnosis not present

## 2020-01-05 DIAGNOSIS — Z7984 Long term (current) use of oral hypoglycemic drugs: Secondary | ICD-10-CM | POA: Diagnosis not present

## 2020-01-05 DIAGNOSIS — Z96652 Presence of left artificial knee joint: Secondary | ICD-10-CM | POA: Diagnosis not present

## 2020-01-05 DIAGNOSIS — Z79891 Long term (current) use of opiate analgesic: Secondary | ICD-10-CM | POA: Diagnosis not present

## 2020-01-05 DIAGNOSIS — Z471 Aftercare following joint replacement surgery: Secondary | ICD-10-CM | POA: Diagnosis not present

## 2020-01-10 DIAGNOSIS — G8929 Other chronic pain: Secondary | ICD-10-CM | POA: Diagnosis not present

## 2020-01-10 DIAGNOSIS — M25562 Pain in left knee: Secondary | ICD-10-CM | POA: Diagnosis not present

## 2020-01-10 DIAGNOSIS — M25561 Pain in right knee: Secondary | ICD-10-CM | POA: Diagnosis not present

## 2020-01-10 DIAGNOSIS — G894 Chronic pain syndrome: Secondary | ICD-10-CM | POA: Diagnosis not present

## 2020-01-13 DIAGNOSIS — Z79891 Long term (current) use of opiate analgesic: Secondary | ICD-10-CM | POA: Diagnosis not present

## 2020-01-13 DIAGNOSIS — Z96612 Presence of left artificial shoulder joint: Secondary | ICD-10-CM | POA: Diagnosis not present

## 2020-01-13 DIAGNOSIS — Z471 Aftercare following joint replacement surgery: Secondary | ICD-10-CM | POA: Diagnosis not present

## 2020-01-13 DIAGNOSIS — E119 Type 2 diabetes mellitus without complications: Secondary | ICD-10-CM | POA: Diagnosis not present

## 2020-01-13 DIAGNOSIS — Z96652 Presence of left artificial knee joint: Secondary | ICD-10-CM | POA: Diagnosis not present

## 2020-01-13 DIAGNOSIS — Z7951 Long term (current) use of inhaled steroids: Secondary | ICD-10-CM | POA: Diagnosis not present

## 2020-01-13 DIAGNOSIS — Z7984 Long term (current) use of oral hypoglycemic drugs: Secondary | ICD-10-CM | POA: Diagnosis not present

## 2020-01-13 DIAGNOSIS — I10 Essential (primary) hypertension: Secondary | ICD-10-CM | POA: Diagnosis not present

## 2020-01-13 DIAGNOSIS — M109 Gout, unspecified: Secondary | ICD-10-CM | POA: Diagnosis not present

## 2020-01-13 DIAGNOSIS — Z9181 History of falling: Secondary | ICD-10-CM | POA: Diagnosis not present

## 2020-01-13 DIAGNOSIS — Z7982 Long term (current) use of aspirin: Secondary | ICD-10-CM | POA: Diagnosis not present

## 2020-01-13 DIAGNOSIS — J449 Chronic obstructive pulmonary disease, unspecified: Secondary | ICD-10-CM | POA: Diagnosis not present

## 2020-01-27 DIAGNOSIS — Z96612 Presence of left artificial shoulder joint: Secondary | ICD-10-CM | POA: Diagnosis not present

## 2020-01-27 DIAGNOSIS — Z7984 Long term (current) use of oral hypoglycemic drugs: Secondary | ICD-10-CM | POA: Diagnosis not present

## 2020-01-27 DIAGNOSIS — Z7982 Long term (current) use of aspirin: Secondary | ICD-10-CM | POA: Diagnosis not present

## 2020-01-27 DIAGNOSIS — Z7951 Long term (current) use of inhaled steroids: Secondary | ICD-10-CM | POA: Diagnosis not present

## 2020-01-27 DIAGNOSIS — J449 Chronic obstructive pulmonary disease, unspecified: Secondary | ICD-10-CM | POA: Diagnosis not present

## 2020-01-27 DIAGNOSIS — Z79891 Long term (current) use of opiate analgesic: Secondary | ICD-10-CM | POA: Diagnosis not present

## 2020-01-27 DIAGNOSIS — E119 Type 2 diabetes mellitus without complications: Secondary | ICD-10-CM | POA: Diagnosis not present

## 2020-01-27 DIAGNOSIS — Z9181 History of falling: Secondary | ICD-10-CM | POA: Diagnosis not present

## 2020-01-27 DIAGNOSIS — M109 Gout, unspecified: Secondary | ICD-10-CM | POA: Diagnosis not present

## 2020-01-27 DIAGNOSIS — Z471 Aftercare following joint replacement surgery: Secondary | ICD-10-CM | POA: Diagnosis not present

## 2020-01-27 DIAGNOSIS — I10 Essential (primary) hypertension: Secondary | ICD-10-CM | POA: Diagnosis not present

## 2020-01-27 DIAGNOSIS — Z96652 Presence of left artificial knee joint: Secondary | ICD-10-CM | POA: Diagnosis not present

## 2020-01-31 DIAGNOSIS — Z471 Aftercare following joint replacement surgery: Secondary | ICD-10-CM | POA: Diagnosis not present

## 2020-01-31 DIAGNOSIS — Z96652 Presence of left artificial knee joint: Secondary | ICD-10-CM | POA: Diagnosis not present

## 2020-01-31 DIAGNOSIS — M25561 Pain in right knee: Secondary | ICD-10-CM | POA: Diagnosis not present

## 2020-02-04 DIAGNOSIS — Z96612 Presence of left artificial shoulder joint: Secondary | ICD-10-CM | POA: Diagnosis not present

## 2020-02-04 DIAGNOSIS — Z7951 Long term (current) use of inhaled steroids: Secondary | ICD-10-CM | POA: Diagnosis not present

## 2020-02-04 DIAGNOSIS — I10 Essential (primary) hypertension: Secondary | ICD-10-CM | POA: Diagnosis not present

## 2020-02-04 DIAGNOSIS — Z7984 Long term (current) use of oral hypoglycemic drugs: Secondary | ICD-10-CM | POA: Diagnosis not present

## 2020-02-04 DIAGNOSIS — Z96652 Presence of left artificial knee joint: Secondary | ICD-10-CM | POA: Diagnosis not present

## 2020-02-04 DIAGNOSIS — Z471 Aftercare following joint replacement surgery: Secondary | ICD-10-CM | POA: Diagnosis not present

## 2020-02-04 DIAGNOSIS — Z7982 Long term (current) use of aspirin: Secondary | ICD-10-CM | POA: Diagnosis not present

## 2020-02-04 DIAGNOSIS — M109 Gout, unspecified: Secondary | ICD-10-CM | POA: Diagnosis not present

## 2020-02-04 DIAGNOSIS — E119 Type 2 diabetes mellitus without complications: Secondary | ICD-10-CM | POA: Diagnosis not present

## 2020-02-04 DIAGNOSIS — J449 Chronic obstructive pulmonary disease, unspecified: Secondary | ICD-10-CM | POA: Diagnosis not present

## 2020-02-04 DIAGNOSIS — Z79891 Long term (current) use of opiate analgesic: Secondary | ICD-10-CM | POA: Diagnosis not present

## 2020-02-04 DIAGNOSIS — Z9181 History of falling: Secondary | ICD-10-CM | POA: Diagnosis not present

## 2020-02-10 DIAGNOSIS — Z471 Aftercare following joint replacement surgery: Secondary | ICD-10-CM | POA: Diagnosis not present

## 2020-02-10 DIAGNOSIS — Z7982 Long term (current) use of aspirin: Secondary | ICD-10-CM | POA: Diagnosis not present

## 2020-02-10 DIAGNOSIS — M109 Gout, unspecified: Secondary | ICD-10-CM | POA: Diagnosis not present

## 2020-02-10 DIAGNOSIS — Z7984 Long term (current) use of oral hypoglycemic drugs: Secondary | ICD-10-CM | POA: Diagnosis not present

## 2020-02-10 DIAGNOSIS — I10 Essential (primary) hypertension: Secondary | ICD-10-CM | POA: Diagnosis not present

## 2020-02-10 DIAGNOSIS — J449 Chronic obstructive pulmonary disease, unspecified: Secondary | ICD-10-CM | POA: Diagnosis not present

## 2020-02-10 DIAGNOSIS — Z9181 History of falling: Secondary | ICD-10-CM | POA: Diagnosis not present

## 2020-02-10 DIAGNOSIS — Z96612 Presence of left artificial shoulder joint: Secondary | ICD-10-CM | POA: Diagnosis not present

## 2020-02-10 DIAGNOSIS — Z96652 Presence of left artificial knee joint: Secondary | ICD-10-CM | POA: Diagnosis not present

## 2020-02-10 DIAGNOSIS — Z7951 Long term (current) use of inhaled steroids: Secondary | ICD-10-CM | POA: Diagnosis not present

## 2020-02-10 DIAGNOSIS — E119 Type 2 diabetes mellitus without complications: Secondary | ICD-10-CM | POA: Diagnosis not present

## 2020-02-10 DIAGNOSIS — Z79891 Long term (current) use of opiate analgesic: Secondary | ICD-10-CM | POA: Diagnosis not present

## 2020-02-16 ENCOUNTER — Ambulatory Visit (INDEPENDENT_AMBULATORY_CARE_PROVIDER_SITE_OTHER): Payer: Medicare Other | Admitting: Podiatry

## 2020-02-16 ENCOUNTER — Encounter: Payer: Self-pay | Admitting: Podiatry

## 2020-02-16 ENCOUNTER — Other Ambulatory Visit: Payer: Self-pay

## 2020-02-16 DIAGNOSIS — Z9889 Other specified postprocedural states: Secondary | ICD-10-CM

## 2020-02-16 DIAGNOSIS — M79674 Pain in right toe(s): Secondary | ICD-10-CM | POA: Diagnosis not present

## 2020-02-16 DIAGNOSIS — M79675 Pain in left toe(s): Secondary | ICD-10-CM | POA: Diagnosis not present

## 2020-02-16 DIAGNOSIS — B351 Tinea unguium: Secondary | ICD-10-CM | POA: Diagnosis not present

## 2020-02-20 NOTE — Progress Notes (Signed)
Subjective:  Patient ID: Ashley Riddle, female    DOB: 07-12-1945,  MRN: [id number]  74 y.o. female presents with painful thick toenails that are difficult to trim. Pain interferes with ambulation. Aggravating factors include wearing enclosed shoe gear. Pain is relieved with periodic professional debridement.   She has podiatric h/o permanent nail avulsion of the right hallux. She voices no new pedal problems on today's visit.  Review of Systems: Negative except as noted in the HPI.  Past Medical History:  Diagnosis Date  . Anxiety   . Arthritis   . Constipation   . COPD (chronic obstructive pulmonary disease) ([place])   . DDD (degenerative disc disease), cervical   . DDD (degenerative disc disease), lumbar   . Gout   . Heart murmur    probable bicuspid AV with mild AS, mild MR by 04/22/14 Echo (Dr. Montez Morita)  . Hepatitis C    treated 2016   . History of blood transfusion   . Hypertension   . Pneumonia 12/02/13   Past Surgical History:  Procedure Laterality Date  . ABDOMINAL HYSTERECTOMY    . APPENDECTOMY    . BACK SURGERY    . CESAREAN SECTION     x 3  . COLONOSCOPY W/ POLYPECTOMY    . HARDWARE REMOVAL Left 12/26/2015   Procedure: REMOVAL K-WIRE LEFT SCAPULA;  Surgeon: Garald Balding, MD;  Location: [facility];  Service: Orthopedics;  Laterality: Left;  . INNER EAR SURGERY     blood vessel  . TONSILLECTOMY    . TOTAL SHOULDER ARTHROPLASTY Left 12/27/2014   Procedure: TOTAL SHOULDER ARTHROPLASTY;  Surgeon: Garald Balding, MD;  Location: [facility];  Service: Orthopedics;  Laterality: Left;   Patient Active Problem List   Diagnosis Date Noted  . Trochanteric bursitis, right hip 12/15/2018  . Acute right-sided low back pain 10/06/2018  . COPD with acute exacerbation ([place]) 04/20/2018  . Anxiety and depression 04/20/2018  . CAP (community acquired pneumonia) 02/18/2017  . Liver cirrhosis ([place]) 02/18/2017  . Leukocytosis 02/18/2017  . Sinus tachycardia 02/18/2017  . Tachypnea  02/18/2017  . RLL pneumonia 02/18/2017  . Increased ammonia level 02/18/2017  . Retained metal fragment left scapula 12/26/2015  . Fracture of glenoid process of left scapula 12/29/2014  . Status post total shoulder arthroplasty 12/27/2014  . Cocaine dependence in remission ([place]) 09/29/2014  . Severe heroin dependence in sustained remission ([place]) 09/29/2014  . Opiate abuse, episodic ([place]) 09/29/2014  . Mild tetrahydrocannabinol (THC) abuse 09/29/2014  . Chronic pain syndrome 04/18/2014  . Primary osteoarthritis of left shoulder 04/18/2014  . Primary osteoarthritis of right knee 04/18/2014  . Primary osteoarthritis of left knee 04/18/2014  . HYPERTENSION, BENIGN ESSENTIAL 03/22/2010  . CONSTIPATION 03/22/2010  . HEPATITIS C 06/04/1995    Current Outpatient Medications:  .  acetaminophen (TYLENOL) 500 MG tablet, Take by mouth., Disp: , Rfl:  .  albuterol (PROVENTIL HFA;VENTOLIN HFA) 108 (90 Base) MCG/ACT inhaler, Inhale 2 puffs into the lungs every 2 (two) hours as needed for wheezing or shortness of breath. , Disp: , Rfl:  .  aspirin 81 MG EC tablet, aspirin 81 mg tablet,delayed release  TAKE 1 TABLET (81 MG TOTAL) BY MOUTH 2 TIMES DAILY FOR 30 DAYS., Disp: , Rfl:  .  BELBUCA 600 MCG FILM, SMARTSIG:1 Strip(s) By Mouth Every 12 Hours, Disp: , Rfl:  .  budesonide (PULMICORT) 0.5 MG/2ML nebulizer solution, Take 2 mLs (0.5 mg total) by nebulization 2 (two) times daily., Disp: 120 mL, Rfl: 0 .  buprenorphine (BUTRANS) 20 MCG/HR PTWK, buprenorphine 20 mcg/hour weekly transdermal patch, Disp: , Rfl:  .  Calcium Carbonate-Vitamin D 600-400 MG-UNIT tablet, Calcium 600 + D(3) 600 mg (1,500 mg)-400 unit tablet  Take 1 tablet twice a day by oral route for 30 days., Disp: , Rfl:  .  clotrimazole-betamethasone (LOTRISONE) cream, Apply 1 application topically 2 (two) times daily as needed (rash under breasts). , Disp: , Rfl: 1 .  colchicine 0.6 MG tablet, Take 0.6 mg by mouth daily as needed (gout  flares). Colcrys, Disp: , Rfl:  .  Cyanocobalamin (VITAMIN B-12 PO), Take 1 tablet by mouth daily., Disp: , Rfl:  .  cyclobenzaprine (FLEXERIL) 5 MG tablet, cyclobenzaprine 5 mg tablet  1-2 tabs every 8 hours as needed, Disp: , Rfl:  .  diclofenac sodium (VOLTAREN) 1 % GEL, Apply 2 g topically 4 (four) times daily as needed (lower back pain)., Disp: 1 Tube, Rfl: 0 .  DULoxetine (CYMBALTA) 30 MG capsule, Take 1 capsule (30 mg total) by mouth daily. (Patient not taking: Reported on 04/20/2018), Disp: 30 capsule, Rfl: 1 .  ipratropium-albuterol (DUONEB) 0.5-2.5 (3) MG/3ML SOLN, Inhale into the lungs., Disp: , Rfl:  .  losartan-hydrochlorothiazide (HYZAAR) 100-25 MG tablet, Take 1 tablet by mouth at bedtime. , Disp: , Rfl: 3 .  metFORMIN (GLUMETZA) 500 MG (MOD) 24 hr tablet, metformin ER 500 mg 24 hr tablet,extended release  Take 1 tablet every day by oral route for 90 days., Disp: , Rfl:  .  Multiple Vitamin (MULTIVITAMIN WITH MINERALS) TABS tablet, Take 1 tablet by mouth daily., Disp: , Rfl:  .  neomycin-polymyxin-hydrocortisone (CORTISPORIN) OTIC solution, Apply 1-2 drops to toe after soaking twice a day, Disp: 10 mL, Rfl: 0 .  Oxycodone HCl 10 MG TABS, Take 5-10 mg by mouth every 4 (four) hours as needed., Disp: , Rfl:  .  oxyCODONE-acetaminophen (PERCOCET/ROXICET) 5-325 MG tablet, TAKE 1 TABLET BY MOUTH EVERY 6 HOURS AS NEEDED FOR 5 DAYS, Disp: , Rfl:  .  rosuvastatin (CRESTOR) 5 MG tablet, Take 5 mg by mouth daily., Disp: , Rfl:  .  sertraline (ZOLOFT) 50 MG tablet, Patient states she only takes 25 mg, Disp: , Rfl:  .  tiotropium (SPIRIVA HANDIHALER) 18 MCG inhalation capsule, Place 18 mcg into inhaler and inhale daily as needed (shortness of breath). , Disp: , Rfl:  .  tiZANidine (ZANAFLEX) 2 MG tablet, Take 1 tablet (2 mg total) by mouth every 6 (six) hours as needed for muscle spasms., Disp: 15 tablet, Rfl: 0 Allergies  Allergen Reactions  . Gabapentin Other (See Comments)  . Pregabalin      Other reaction(s): Confusion  . Aspirin Other (See Comments)    Due to history of hepatitis  . Penicillins Hives    Has patient had a PCN reaction causing immediate rash, facial/tongue/throat swelling, SOB or lightheadedness with hypotension: Yes Has patient had a PCN reaction causing severe rash involving mucus membranes or skin necrosis: No Has patient had a PCN reaction that required hospitalization No Has patient had a PCN reaction occurring within the last 10 years: No If all of the above answers are "NO", then may proceed with Cephalosporin use.    Social History   Occupational History  . Occupation: retired  Tobacco Use  . Smoking status: Former Smoker    Packs/day: 1.00    Years: 49.00    Pack years: 49.00    Types: Cigarettes    Quit date: 2007    Years since quitting:  14.7  . Smokeless tobacco: Never Used  . Tobacco comment: quit 2007  Vaping Use  . Vaping Use: Never used  Substance and Sexual Activity  . Alcohol use: No  . Drug use: No    Types: Marijuana    Comment: quit marijuana 12/18. last used heroin and cocaine in 1992.  Smokes Marijuana once a week., 12/22/15- "2 weeks ago"  . Sexual activity: Not on file    Objective:   Constitutional Pt is a pleasant 74 y.o. African American female WD, WN in NAD.Marland Kitchen AAO x 3.   Vascular Capillary fill time to digits <3 seconds b/l lower extremities. Palpable pedal pulses b/l LE. Pedal hair sparse. Lower extremity skin temperature gradient within normal limits. No edema noted b/l lower extremities. No ischemia or gangrene noted b/l lower extremities. No cyanosis or clubbing noted.  Neurologic Normal speech. Oriented to person, place, and time. Protective sensation intact 5/5 intact bilaterally with 10g monofilament b/l. Proprioception intact bilaterally. Clonus negative b/l.  Dermatologic Pedal skin with normal turgor, texture and tone bilaterally. No open wounds bilaterally. No interdigital macerations bilaterally. Toenails  1-5 left, R 2nd toe, R 3rd toe, R 4th toe and R 5th toe elongated, discolored, dystrophic, thickened, and crumbly with subungual debris and tenderness to dorsal palpation.  Orthopedic: Normal muscle strength 5/5 to all lower extremity muscle groups bilaterally. No pain crepitus or joint limitation noted with ROM b/l. Hallux valgus with bunion deformity noted b/l lower extremities.   Radiographs: None Assessment:   1. Pain due to onychomycosis of toenails of both feet   2. Status post nail surgery    Plan:  Patient was evaluated and treated and all questions answered.  Onychomycosis with pain -Nails palliatively debridement as below. -Educated on self-care  Procedure: Nail Debridement Rationale: Pain Type of Debridement: manual, sharp debridement. Instrumentation: Nail nipper, rotary burr. Number of Nails: 9  -Examined patient. -Toenails 1-5 left, R 2nd toe, R 3rd toe, R 4th toe and R 5th toe debrided in length and girth without iatrogenic bleeding with sterile nail nipper and dremel.  -Patient to report any pedal injuries to medical professional immediately. -Patient to continue soft, supportive shoe gear daily. -Patient/POA to call should there be question/concern in the interim.  Return in about 3 months (around 05/17/2020) for nail trim.  Marzetta Board, DPM

## 2020-03-01 DIAGNOSIS — G8929 Other chronic pain: Secondary | ICD-10-CM | POA: Diagnosis not present

## 2020-03-01 DIAGNOSIS — G894 Chronic pain syndrome: Secondary | ICD-10-CM | POA: Diagnosis not present

## 2020-03-01 DIAGNOSIS — M792 Neuralgia and neuritis, unspecified: Secondary | ICD-10-CM | POA: Diagnosis not present

## 2020-03-01 DIAGNOSIS — M25562 Pain in left knee: Secondary | ICD-10-CM | POA: Diagnosis not present

## 2020-03-01 DIAGNOSIS — M25561 Pain in right knee: Secondary | ICD-10-CM | POA: Diagnosis not present

## 2020-03-02 DIAGNOSIS — J410 Simple chronic bronchitis: Secondary | ICD-10-CM | POA: Diagnosis not present

## 2020-03-02 DIAGNOSIS — I10 Essential (primary) hypertension: Secondary | ICD-10-CM | POA: Diagnosis not present

## 2020-03-02 DIAGNOSIS — E785 Hyperlipidemia, unspecified: Secondary | ICD-10-CM | POA: Diagnosis not present

## 2020-03-02 DIAGNOSIS — E1165 Type 2 diabetes mellitus with hyperglycemia: Secondary | ICD-10-CM | POA: Diagnosis not present

## 2020-03-02 DIAGNOSIS — M5441 Lumbago with sciatica, right side: Secondary | ICD-10-CM | POA: Diagnosis not present

## 2020-03-02 DIAGNOSIS — M1712 Unilateral primary osteoarthritis, left knee: Secondary | ICD-10-CM | POA: Diagnosis not present

## 2020-03-03 ENCOUNTER — Other Ambulatory Visit: Payer: Self-pay

## 2020-03-03 ENCOUNTER — Ambulatory Visit
Admission: RE | Admit: 2020-03-03 | Discharge: 2020-03-03 | Disposition: A | Discharge status: Home / Self Care | Payer: Medicare Other | Source: Ambulatory Visit | Attending: Internal Medicine | Admitting: Internal Medicine

## 2020-03-03 DIAGNOSIS — Z1231 Encounter for screening mammogram for malignant neoplasm of breast: Secondary | ICD-10-CM

## 2020-03-14 ENCOUNTER — Ambulatory Visit: Payer: Medicare Other

## 2020-03-15 DIAGNOSIS — K7469 Other cirrhosis of liver: Secondary | ICD-10-CM | POA: Diagnosis not present

## 2020-03-16 ENCOUNTER — Other Ambulatory Visit: Payer: Self-pay | Admitting: Nurse Practitioner

## 2020-03-16 DIAGNOSIS — K7460 Unspecified cirrhosis of liver: Secondary | ICD-10-CM

## 2020-03-22 ENCOUNTER — Ambulatory Visit
Admission: RE | Admit: 2020-03-22 | Discharge: 2020-03-22 | Disposition: A | Discharge status: Home / Self Care | Payer: Medicare Other | Source: Ambulatory Visit | Attending: Nurse Practitioner | Admitting: Nurse Practitioner

## 2020-03-22 DIAGNOSIS — K7469 Other cirrhosis of liver: Secondary | ICD-10-CM | POA: Diagnosis not present

## 2020-03-22 DIAGNOSIS — K7460 Unspecified cirrhosis of liver: Secondary | ICD-10-CM

## 2020-03-30 DIAGNOSIS — Z96651 Presence of right artificial knee joint: Secondary | ICD-10-CM | POA: Diagnosis not present

## 2020-03-30 DIAGNOSIS — Z96652 Presence of left artificial knee joint: Secondary | ICD-10-CM | POA: Diagnosis not present

## 2020-03-30 DIAGNOSIS — R262 Difficulty in walking, not elsewhere classified: Secondary | ICD-10-CM | POA: Diagnosis not present

## 2020-04-03 DIAGNOSIS — Z96652 Presence of left artificial knee joint: Secondary | ICD-10-CM | POA: Diagnosis not present

## 2020-04-03 DIAGNOSIS — R262 Difficulty in walking, not elsewhere classified: Secondary | ICD-10-CM | POA: Diagnosis not present

## 2020-04-05 DIAGNOSIS — Z96652 Presence of left artificial knee joint: Secondary | ICD-10-CM | POA: Diagnosis not present

## 2020-04-05 DIAGNOSIS — R262 Difficulty in walking, not elsewhere classified: Secondary | ICD-10-CM | POA: Diagnosis not present

## 2020-04-10 DIAGNOSIS — Z96652 Presence of left artificial knee joint: Secondary | ICD-10-CM | POA: Diagnosis not present

## 2020-04-10 DIAGNOSIS — R262 Difficulty in walking, not elsewhere classified: Secondary | ICD-10-CM | POA: Diagnosis not present

## 2020-04-12 DIAGNOSIS — Z96652 Presence of left artificial knee joint: Secondary | ICD-10-CM | POA: Diagnosis not present

## 2020-04-12 DIAGNOSIS — R262 Difficulty in walking, not elsewhere classified: Secondary | ICD-10-CM | POA: Diagnosis not present

## 2020-04-17 DIAGNOSIS — Z96652 Presence of left artificial knee joint: Secondary | ICD-10-CM | POA: Diagnosis not present

## 2020-04-17 DIAGNOSIS — R262 Difficulty in walking, not elsewhere classified: Secondary | ICD-10-CM | POA: Diagnosis not present

## 2020-04-19 ENCOUNTER — Emergency Department (HOSPITAL_COMMUNITY): Payer: Medicare Other

## 2020-04-19 ENCOUNTER — Inpatient Hospital Stay (HOSPITAL_COMMUNITY)
Admission: EM | Admit: 2020-04-19 | Discharge: 2020-04-22 | DRG: 871 | Disposition: A | Discharge status: Home / Self Care | Payer: Medicare Other | Attending: Family Medicine | Admitting: Family Medicine

## 2020-04-19 ENCOUNTER — Other Ambulatory Visit: Payer: Self-pay

## 2020-04-19 ENCOUNTER — Encounter (HOSPITAL_COMMUNITY): Payer: Self-pay

## 2020-04-19 DIAGNOSIS — M19012 Primary osteoarthritis, left shoulder: Secondary | ICD-10-CM | POA: Diagnosis present

## 2020-04-19 DIAGNOSIS — R000 Tachycardia, unspecified: Secondary | ICD-10-CM | POA: Diagnosis not present

## 2020-04-19 DIAGNOSIS — M5030 Other cervical disc degeneration, unspecified cervical region: Secondary | ICD-10-CM | POA: Diagnosis present

## 2020-04-19 DIAGNOSIS — J90 Pleural effusion, not elsewhere classified: Secondary | ICD-10-CM | POA: Diagnosis not present

## 2020-04-19 DIAGNOSIS — R6520 Severe sepsis without septic shock: Secondary | ICD-10-CM | POA: Diagnosis present

## 2020-04-19 DIAGNOSIS — E669 Obesity, unspecified: Secondary | ICD-10-CM | POA: Diagnosis present

## 2020-04-19 DIAGNOSIS — M170 Bilateral primary osteoarthritis of knee: Secondary | ICD-10-CM | POA: Diagnosis present

## 2020-04-19 DIAGNOSIS — K7460 Unspecified cirrhosis of liver: Secondary | ICD-10-CM | POA: Diagnosis not present

## 2020-04-19 DIAGNOSIS — F419 Anxiety disorder, unspecified: Secondary | ICD-10-CM | POA: Diagnosis present

## 2020-04-19 DIAGNOSIS — I10 Essential (primary) hypertension: Secondary | ICD-10-CM | POA: Diagnosis not present

## 2020-04-19 DIAGNOSIS — J189 Pneumonia, unspecified organism: Secondary | ICD-10-CM | POA: Diagnosis present

## 2020-04-19 DIAGNOSIS — Z96652 Presence of left artificial knee joint: Secondary | ICD-10-CM | POA: Diagnosis present

## 2020-04-19 DIAGNOSIS — Z6841 Body Mass Index (BMI) 40.0 and over, adult: Secondary | ICD-10-CM

## 2020-04-19 DIAGNOSIS — Z87891 Personal history of nicotine dependence: Secondary | ICD-10-CM

## 2020-04-19 DIAGNOSIS — G894 Chronic pain syndrome: Secondary | ICD-10-CM | POA: Diagnosis present

## 2020-04-19 DIAGNOSIS — B1920 Unspecified viral hepatitis C without hepatic coma: Secondary | ICD-10-CM | POA: Diagnosis present

## 2020-04-19 DIAGNOSIS — Z20822 Contact with and (suspected) exposure to covid-19: Secondary | ICD-10-CM | POA: Diagnosis present

## 2020-04-19 DIAGNOSIS — Z7951 Long term (current) use of inhaled steroids: Secondary | ICD-10-CM

## 2020-04-19 DIAGNOSIS — M5136 Other intervertebral disc degeneration, lumbar region: Secondary | ICD-10-CM | POA: Diagnosis not present

## 2020-04-19 DIAGNOSIS — F32A Depression, unspecified: Secondary | ICD-10-CM | POA: Diagnosis present

## 2020-04-19 DIAGNOSIS — R079 Chest pain, unspecified: Secondary | ICD-10-CM | POA: Diagnosis not present

## 2020-04-19 DIAGNOSIS — Z79899 Other long term (current) drug therapy: Secondary | ICD-10-CM

## 2020-04-19 DIAGNOSIS — R0902 Hypoxemia: Secondary | ICD-10-CM | POA: Diagnosis not present

## 2020-04-19 DIAGNOSIS — J441 Chronic obstructive pulmonary disease with (acute) exacerbation: Secondary | ICD-10-CM | POA: Diagnosis not present

## 2020-04-19 DIAGNOSIS — A419 Sepsis, unspecified organism: Secondary | ICD-10-CM

## 2020-04-19 DIAGNOSIS — M109 Gout, unspecified: Secondary | ICD-10-CM | POA: Diagnosis not present

## 2020-04-19 DIAGNOSIS — G9341 Metabolic encephalopathy: Secondary | ICD-10-CM | POA: Diagnosis present

## 2020-04-19 DIAGNOSIS — Z8249 Family history of ischemic heart disease and other diseases of the circulatory system: Secondary | ICD-10-CM

## 2020-04-19 DIAGNOSIS — I700 Atherosclerosis of aorta: Secondary | ICD-10-CM | POA: Diagnosis present

## 2020-04-19 DIAGNOSIS — Z886 Allergy status to analgesic agent status: Secondary | ICD-10-CM

## 2020-04-19 DIAGNOSIS — J440 Chronic obstructive pulmonary disease with acute lower respiratory infection: Secondary | ICD-10-CM | POA: Diagnosis not present

## 2020-04-19 DIAGNOSIS — R109 Unspecified abdominal pain: Secondary | ICD-10-CM | POA: Diagnosis not present

## 2020-04-19 DIAGNOSIS — E876 Hypokalemia: Secondary | ICD-10-CM | POA: Diagnosis not present

## 2020-04-19 DIAGNOSIS — R918 Other nonspecific abnormal finding of lung field: Secondary | ICD-10-CM | POA: Diagnosis not present

## 2020-04-19 DIAGNOSIS — Z96612 Presence of left artificial shoulder joint: Secondary | ICD-10-CM | POA: Diagnosis present

## 2020-04-19 DIAGNOSIS — F1110 Opioid abuse, uncomplicated: Secondary | ICD-10-CM | POA: Diagnosis present

## 2020-04-19 DIAGNOSIS — Z90710 Acquired absence of both cervix and uterus: Secondary | ICD-10-CM

## 2020-04-19 DIAGNOSIS — Z888 Allergy status to other drugs, medicaments and biological substances status: Secondary | ICD-10-CM

## 2020-04-19 DIAGNOSIS — R911 Solitary pulmonary nodule: Secondary | ICD-10-CM | POA: Diagnosis not present

## 2020-04-19 DIAGNOSIS — Z7984 Long term (current) use of oral hypoglycemic drugs: Secondary | ICD-10-CM

## 2020-04-19 DIAGNOSIS — Z9049 Acquired absence of other specified parts of digestive tract: Secondary | ICD-10-CM

## 2020-04-19 DIAGNOSIS — Z880 Allergy status to penicillin: Secondary | ICD-10-CM

## 2020-04-19 HISTORY — DX: Sepsis, unspecified organism: A41.9

## 2020-04-19 LAB — CBC WITH DIFFERENTIAL/PLATELET
Abs Immature Granulocytes: 0.06 10*3/uL (ref 0.00–0.07)
Basophils Absolute: 0 10*3/uL (ref 0.0–0.1)
Basophils Relative: 0 %
Eosinophils Absolute: 0 10*3/uL (ref 0.0–0.5)
Eosinophils Relative: 0 %
HCT: 37.3 % (ref 36.0–46.0)
Hemoglobin: 11.8 g/dL — ABNORMAL LOW (ref 12.0–15.0)
Immature Granulocytes: 1 %
Lymphocytes Relative: 14 %
Lymphs Abs: 1.3 10*3/uL (ref 0.7–4.0)
MCH: 27.6 pg (ref 26.0–34.0)
MCHC: 31.6 g/dL (ref 30.0–36.0)
MCV: 87.1 fL (ref 80.0–100.0)
Monocytes Absolute: 0.6 10*3/uL (ref 0.1–1.0)
Monocytes Relative: 7 %
Neutro Abs: 7.3 10*3/uL (ref 1.7–7.7)
Neutrophils Relative %: 78 %
Platelets: 179 10*3/uL (ref 150–400)
RBC: 4.28 MIL/uL (ref 3.87–5.11)
RDW: 12.9 % (ref 11.5–15.5)
WBC: 9.3 10*3/uL (ref 4.0–10.5)
nRBC: 0 % (ref 0.0–0.2)

## 2020-04-19 LAB — URINALYSIS, ROUTINE W REFLEX MICROSCOPIC
Bacteria, UA: NONE SEEN
Bilirubin Urine: NEGATIVE
Glucose, UA: NEGATIVE mg/dL
Ketones, ur: NEGATIVE mg/dL
Leukocytes,Ua: NEGATIVE
Nitrite: NEGATIVE
Protein, ur: NEGATIVE mg/dL
Specific Gravity, Urine: 1.033 — ABNORMAL HIGH (ref 1.005–1.030)
pH: 7 (ref 5.0–8.0)

## 2020-04-19 LAB — RESPIRATORY PANEL BY RT PCR (FLU A&B, COVID)
Influenza A by PCR: NEGATIVE
Influenza B by PCR: NEGATIVE
SARS Coronavirus 2 by RT PCR: NEGATIVE

## 2020-04-19 LAB — COMPREHENSIVE METABOLIC PANEL
ALT: 14 U/L (ref 0–44)
AST: 25 U/L (ref 15–41)
Albumin: 3.8 g/dL (ref 3.5–5.0)
Alkaline Phosphatase: 57 U/L (ref 38–126)
Anion gap: 13 (ref 5–15)
BUN: 6 mg/dL — ABNORMAL LOW (ref 8–23)
CO2: 28 mmol/L (ref 22–32)
Calcium: 10.3 mg/dL (ref 8.9–10.3)
Chloride: 95 mmol/L — ABNORMAL LOW (ref 98–111)
Creatinine, Ser: 0.72 mg/dL (ref 0.44–1.00)
GFR, Estimated: >60 mL/min (ref >60)
Glucose, Bld: 116 mg/dL — ABNORMAL HIGH (ref 70–99)
Potassium: 2.6 mmol/L — CL (ref 3.5–5.1)
Sodium: 136 mmol/L (ref 135–145)
Total Bilirubin: 0.9 mg/dL (ref 0.3–1.2)
Total Protein: 7.4 g/dL (ref 6.5–8.1)

## 2020-04-19 LAB — CBG MONITORING, ED: Glucose-Capillary: 134 mg/dL — ABNORMAL HIGH (ref 70–99)

## 2020-04-19 LAB — RAPID URINE DRUG SCREEN, HOSP PERFORMED
Amphetamines: NOT DETECTED
Barbiturates: NOT DETECTED
Benzodiazepines: POSITIVE — AB
Cocaine: NOT DETECTED
Opiates: POSITIVE — AB
Tetrahydrocannabinol: NOT DETECTED

## 2020-04-19 LAB — APTT: aPTT: 30 seconds (ref 24–36)

## 2020-04-19 LAB — LACTIC ACID, PLASMA
Lactic Acid, Venous: 2.4 mmol/L (ref 0.5–1.9)
Lactic Acid, Venous: 2.8 mmol/L (ref 0.5–1.9)

## 2020-04-19 LAB — TROPONIN I (HIGH SENSITIVITY)
Troponin I (High Sensitivity): 61 ng/L — ABNORMAL HIGH (ref <18)
Troponin I (High Sensitivity): 50 ng/L — ABNORMAL HIGH (ref <18)
Troponin I (High Sensitivity): 50 ng/L — ABNORMAL HIGH (ref <18)

## 2020-04-19 LAB — PROTIME-INR
INR: 1.2 (ref 0.8–1.2)
Prothrombin Time: 14.4 seconds (ref 11.4–15.2)

## 2020-04-19 LAB — MAGNESIUM: Magnesium: 1 mg/dL — ABNORMAL LOW (ref 1.7–2.4)

## 2020-04-19 LAB — D-DIMER, QUANTITATIVE: D-Dimer, Quant: 4.28 ug/mL-FEU — ABNORMAL HIGH (ref 0.00–0.50)

## 2020-04-19 MED ORDER — LACTATED RINGERS IV SOLN: INTRAVENOUS | Status: AC

## 2020-04-19 MED ORDER — OXYCODONE HCL 5 MG PO TABS
10.0000 mg | ORAL_TABLET | Freq: Three times a day (TID) | ORAL | Status: DC | PRN
  Administered 2020-04-20 – 2020-04-22 (×8): 10 mg via ORAL
  Filled 2020-04-19 (×8): qty 2

## 2020-04-19 MED ORDER — ROSUVASTATIN CALCIUM 5 MG PO TABS
5.0000 mg | ORAL_TABLET | Freq: Every day | ORAL | Status: DC
  Administered 2020-04-19 – 2020-04-22 (×4): 5 mg via ORAL
  Filled 2020-04-19 (×5): qty 1

## 2020-04-19 MED ORDER — OXYCODONE HCL 5 MG PO TABS
10.0000 mg | ORAL_TABLET | Freq: Three times a day (TID) | ORAL | Status: DC | PRN
  Administered 2020-04-19: 10 mg via ORAL
  Filled 2020-04-19: qty 2

## 2020-04-19 MED ORDER — LACTATED RINGERS IV BOLUS (SEPSIS)
2000.0000 mL | Freq: Once | INTRAVENOUS | Status: AC
  Administered 2020-04-19: 2000 mL via INTRAVENOUS

## 2020-04-19 MED ORDER — ENOXAPARIN SODIUM 40 MG/0.4ML ~~LOC~~ SOLN
40.0000 mg | SUBCUTANEOUS | Status: DC
  Administered 2020-04-19: 40 mg via SUBCUTANEOUS
  Filled 2020-04-19: qty 0.4

## 2020-04-19 MED ORDER — SODIUM CHLORIDE 0.9 % IV SOLN
500.0000 mg | Freq: Once | INTRAVENOUS | Status: AC
  Administered 2020-04-19: 500 mg via INTRAVENOUS
  Filled 2020-04-19: qty 500

## 2020-04-19 MED ORDER — POTASSIUM CHLORIDE 10 MEQ/100ML IV SOLN
10.0000 meq | INTRAVENOUS | Status: AC
  Administered 2020-04-19 (×2): 10 meq via INTRAVENOUS
  Filled 2020-04-19 (×2): qty 100

## 2020-04-19 MED ORDER — MAGNESIUM SULFATE 2 GM/50ML IV SOLN
2.0000 g | Freq: Once | INTRAVENOUS | Status: AC
  Administered 2020-04-19: 2 g via INTRAVENOUS
  Filled 2020-04-19: qty 50

## 2020-04-19 MED ORDER — DEXTROSE 5 % IV SOLN
250.0000 mg | INTRAVENOUS | Status: DC
  Administered 2020-04-20: 250 mg via INTRAVENOUS
  Filled 2020-04-19 (×3): qty 250

## 2020-04-19 MED ORDER — BUPRENORPHINE HCL 600 MCG BU FILM: 1.0000 | ORAL_FILM | Freq: Two times a day (BID) | BUCCAL | Status: DC

## 2020-04-19 MED ORDER — SODIUM CHLORIDE 0.9 % IV SOLN: 500.0000 mg | INTRAVENOUS | Status: DC

## 2020-04-19 MED ORDER — HYDROCHLOROTHIAZIDE 25 MG PO TABS
25.0000 mg | ORAL_TABLET | Freq: Every day | ORAL | Status: DC
  Filled 2020-04-19: qty 1

## 2020-04-19 MED ORDER — LOSARTAN POTASSIUM 50 MG PO TABS
100.0000 mg | ORAL_TABLET | Freq: Every day | ORAL | Status: DC
  Filled 2020-04-19: qty 2

## 2020-04-19 MED ORDER — SODIUM CHLORIDE 0.9 % IV SOLN
2.0000 g | INTRAVENOUS | Status: DC
  Administered 2020-04-19 – 2020-04-20 (×2): 2 g via INTRAVENOUS
  Filled 2020-04-19: qty 2
  Filled 2020-04-19 (×2): qty 20

## 2020-04-19 MED ORDER — BUDESONIDE 0.5 MG/2ML IN SUSP
0.5000 mg | Freq: Two times a day (BID) | RESPIRATORY_TRACT | Status: DC
  Administered 2020-04-19 – 2020-04-22 (×6): 0.5 mg via RESPIRATORY_TRACT
  Filled 2020-04-19 (×7): qty 2

## 2020-04-19 MED ORDER — ALBUTEROL SULFATE HFA 108 (90 BASE) MCG/ACT IN AERS
2.0000 | INHALATION_SPRAY | RESPIRATORY_TRACT | Status: DC | PRN
  Filled 2020-04-19 (×2): qty 6.7

## 2020-04-19 MED ORDER — ACETAMINOPHEN 325 MG PO TABS
650.0000 mg | ORAL_TABLET | Freq: Once | ORAL | Status: AC
  Administered 2020-04-19: 650 mg via ORAL
  Filled 2020-04-19: qty 2

## 2020-04-19 MED ORDER — LOSARTAN POTASSIUM 50 MG PO TABS
50.0000 mg | ORAL_TABLET | Freq: Every day | ORAL | Status: DC
  Administered 2020-04-19 – 2020-04-22 (×4): 50 mg via ORAL
  Filled 2020-04-19 (×4): qty 1

## 2020-04-19 MED ORDER — LOSARTAN POTASSIUM-HCTZ 100-25 MG PO TABS: 1.0000 | ORAL_TABLET | Freq: Every day | ORAL | Status: DC

## 2020-04-19 MED ORDER — TIOTROPIUM BROMIDE MONOHYDRATE 18 MCG IN CAPS: 18.0000 ug | ORAL_CAPSULE | Freq: Every day | RESPIRATORY_TRACT | Status: DC | PRN

## 2020-04-19 MED ORDER — ACETAMINOPHEN 325 MG PO TABS
650.0000 mg | ORAL_TABLET | Freq: Four times a day (QID) | ORAL | Status: DC | PRN
  Administered 2020-04-19: 650 mg via ORAL
  Filled 2020-04-19 (×2): qty 2

## 2020-04-19 MED ORDER — POTASSIUM CHLORIDE CRYS ER 20 MEQ PO TBCR
40.0000 meq | EXTENDED_RELEASE_TABLET | Freq: Once | ORAL | Status: AC
  Administered 2020-04-19: 40 meq via ORAL
  Filled 2020-04-19: qty 2

## 2020-04-19 MED ORDER — IOHEXOL 350 MG/ML SOLN
100.0000 mL | Freq: Once | INTRAVENOUS | Status: AC | PRN
  Administered 2020-04-19: 100 mL via INTRAVENOUS

## 2020-04-19 MED ORDER — SERTRALINE HCL 50 MG PO TABS
25.0000 mg | ORAL_TABLET | Freq: Every day | ORAL | Status: DC
  Administered 2020-04-19 – 2020-04-22 (×4): 25 mg via ORAL
  Filled 2020-04-19 (×4): qty 1

## 2020-04-19 NOTE — ED Notes (Signed)
Not Ao x4, children stated recent knee replacement.

## 2020-04-19 NOTE — ED Notes (Signed)
Unable to draw lactic RN aware

## 2020-04-19 NOTE — ED Provider Notes (Signed)
Patient care assumed at 1500.    Pt here with AMS, hypoxia, CTPE and CTAP pending.   CT demonstrates multifocal pna, no PE.  Medicine consulted for admission for ongoing treatment.     Quintella Reichert, MD 04/20/20 0010

## 2020-04-19 NOTE — Progress Notes (Signed)
FPTS Interim Progress Note  S: Went to see patient for evening check. She is sleepy and reports back pain but denies other complaints.   O: BP (!) 150/77    Pulse (!) 107    Temp 100.1 F (37.8 C) (Oral)    Resp 17    Ht 5\' 3"  (1.6 m)    Wt 108.9 kg    SpO2 96%    BMI 42.51 kg/m   Gen: sleepy but easily arousable, NAD CV: tachycardic, normal S1/S2 without m/r/g Resp: speaking in full sentences, breathing comfortably on 2L, diffuse rhonchi on R Neuro: oriented to person and place. Knows it's almost Thanksgiving.  A/P: Sepsis 2/2 Multifocal PNA of R Lung: stable, improving Patient still tachycardic in the low 100s and intermittently tachypneic in the 20s, but appears to be breathing comfortably. Decreased to 2L and she maintained SpO2 93%. BP stable at 150/77. -Will continue to monitor closely overnight -Tylenol 650mg  q6h prn for fever -Holding HCTZ for now, given Losartan 50mg  (instead of home 100mg ) -Continue abx and fluids per day team  Hypokalemia Patient received 20 mEq IV K. -Will give 9mEq PO potassium -am CMP   Alcus Dad, MD 04/19/2020, 9:25 PM PGY-1, [facility] pager [phone]

## 2020-04-19 NOTE — ED Triage Notes (Addendum)
Pt BIB daughter with c/o her mother being altered. Daughter states that she arrived to her mother house her mother seemed altered and had blood coming from her mouth. Pt is febrile in triage. Pt also had a low sp02 of 86 on RA. Per daughter pt is fully vaccinated.

## 2020-04-19 NOTE — H&P (Addendum)
Ashley Riddle: [phone]  Patient name: Ashley [facility] Medical record number: [id number] Date of birth: 1945-12-28 Age: 74 y.o. Gender: female  Primary Care Provider: Audley Hose, MD Consultants: None Code Status: Full   Preferred Emergency Contact: Daughter Mariann Laster [phone]  Chief Complaint: Confusion, abdominal pain, fever  Assessment and Plan: [name] is a 74 y.o. female presenting with sepsis due to multifocal pneumonia of the right mid and posterior lobes. PMH is significant for COPD, liver cirrhosis, hepatitis C, anxiety and depression, hypertension, chronic pain syndrome, osteoarthritis of both knees s/p TKA left knee July 2021, remote history of drug abuse (cocaine, heroin, opiate, THC)  Sepsis 2/2 multifocal pneumonia of right lung Meet sepsis criteria: presented to the ED febrile, tachycardic, and tachypneic with radiographic and physical exam evidence of multifocal pneumonia. CTA chest demonstrates "large multifocal areas of airspace consolidation with air bronchogram seen throughout the right lung, likely due to multifocal pneumonia."  D-dimer 4.28; CTA chest negative for PE. Lactic acid 2.4 > 2.8.  Hypokalemia of 2.6, magnesium 1.0 as detailed separately below.  CBC within normal limits including hemoglobin 11.8, WBC 9.3.  PT, INR, APTT within normal limits.  UA shows small hemoglobin, SG 1.033.  Respiratory panel negative for flu A, flu B, and Covid.  S/p 3 L fluid bolus with LR in ED. Blood cultures and urine cultures collected in the ED.  Started on Rocephin and azithromycin in ED.  Suspect aspiration pneumonia, concern for high number of sedating medications prescribed (detailed below in sections covering chronic pain and depression/anxiety). -Admit to progressive floor with Dr. Erin Hearing attending -LR maintenance fluids at 150 mL/hr -Follow-up repeat troponin -Consider further  trending lactic acid if condition worsens -Rocephin daily -Azithromycin daily x5 days (11/17-21) -Follow-up blood and urine cultures -Follow-up morning CBC, CMP, mag, phosphorus, procalcitonin, pro time INR -Close monitoring of respiratory status, concern that the patient may wear out. -PT/OT eval and treat  Chest pain Complains of chest pain reproducible with palpation in addition to epigastric pain.  Initial troponin 61, pending follow-up.  Twelve-lead EKG negative for STEMI.  Question of relation to multifocal pneumonia. -Follow-up repeat troponin -Pending follow-up troponins consider repeat EKG in the morning  Hypokalemia Potassium 2.6 in ED. Patient currently on thiazide diuretic for blood pressure and this could be related to poor p.o. intake accompanied with this medication.  S/p 2 rounds of 10 mEq each in ED.  Requires at least 40 mEq additional supplementation.  If passed bedside swallow, may administer oral potassium. -Replete potassium IV versus p.o. -Morning BMP  Hypomagnesemia  Magnesium 1.0 in ED.  S/p 2 g IV in ED.  Will need further repletion, however will need to wait until tomorrow to avoid side effects. -Recheck magnesium tomorrow, will likely need further repletion with 2 g IVPB  Hypertension At home, on losartan-hydrochlorothiazide 100-25 mg daily at bedtime.  Blood pressure on admission (122-154)/(67-85), pulse 107-127.  Last 149/85, pulse 112. -We will continue home med -Vitals per unit protocol  Liver cirrhosis  hepatitis C Last saw [name], NP in [facility] on 03/15/2020 for outpatient visit.  No meds currently. -Monitor  COPD At home, prescribed Pulmicort twice daily (patient taking as needed), Spiriva daily as needed, and albuterol inhaler every 2 as needed. -Continue Pulmicort twice daily -Continue Spiriva daily as needed -Albuterol every 6 as needed  Chronic pain   History of substance abuse with cocaine,  heroin, opiate, THC UDS positive for both opiates and benzos.  Currently seen in the pain clinic, has pain contract.  Takes Norco 10-325 three times daily in addition to Belbuca 600 mcg film.  Also prescribed Zanaflex 2 mg every 6 as needed and Flexeril 5 mg daily as needed.  Complained of back pain while in ED, saying she had not taken any pain medicine all day. -Will continue Belbuca -Start oxycodone IR 10 every 6 as needed -Monitor mental status as well as respiratory status, may need to decrease dose/discontinue opiate pain medications -Decline Norco inpatient due to acetaminophen combo  Depression and anxiety At home, prescribed duloxetine 30 mg daily and Zoloft 25 mg daily.  Patient reports taking Zoloft only. -Will continue Zoloft  Osteoarthritis of bilateral knees, left shoulder S/p left TKA with orthopedist July 2021.  Chronic pain as detailed above.  -Up with assistance  Gout At home, prescribed colchicine 0.6 mg daily as needed for gout flares.  Per patient, has not needed in several months. -Monitor  FEN/GI: N.p.o. until passed swallow test Prophylaxis: Lovenox  Disposition: Progressive  History of Present Illness:  [name] is a 74 y.o. female presenting with confusion, weakness, and fever.  The daughter Mariann Laster owns a daycare, which Ms. [place] visits frequently.  When she visited last night, the daughter noticed that the patient "was little off", possibly slurring her words.  Patient kept reassuring daughter that she was all right. She had an orthopedic appointment scheduled for this morning, and according to the daughter "she never misses her appointments".  When the patient failed to show up at the daughters daycare this afternoon as expected, the daughter called the patient but got her voicemail.  The daughter then called the orthopedic office, which reported that the patient had missed her appointment this morning.  The daughter then went to the patient's house,  found her to be still in bed, confused, with "vomit and blood around her lips".  Daughter noted that she was weak and had a fever, could not get up to get dressed.  She subsequently brought her into the ED. Patient reported that she started vomiting around 4 AM this morning.   Review Of Systems: Per HPI with the following additions:   Review of Systems  Constitutional: Positive for activity change, fatigue and fever.  Respiratory: Positive for cough, shortness of breath and wheezing.   Cardiovascular: Positive for chest pain.  Gastrointestinal: Positive for abdominal distention.  Endocrine: Negative for polyuria.  Genitourinary: Negative for dysuria and frequency.  Psychiatric/Behavioral: Positive for confusion and decreased concentration.     Patient Active Problem List   Diagnosis Date Noted  . Sepsis ([place]) 04/19/2020  . Trochanteric bursitis, right hip 12/15/2018  . Acute right-sided low back pain 10/06/2018  . COPD with acute exacerbation ([place]) 04/20/2018  . Anxiety and depression 04/20/2018  . CAP (community acquired pneumonia) 02/18/2017  . Liver cirrhosis ([place]) 02/18/2017  . Leukocytosis 02/18/2017  . Sinus tachycardia 02/18/2017  . Tachypnea 02/18/2017  . RLL pneumonia 02/18/2017  . Increased ammonia level 02/18/2017  . Retained metal fragment left scapula 12/26/2015  . Fracture of glenoid process of left scapula 12/29/2014  . Status post total shoulder arthroplasty 12/27/2014  . Cocaine dependence in remission ([place]) 09/29/2014  . Severe heroin dependence in sustained remission ([place]) 09/29/2014  . Opiate abuse, episodic ([place]) 09/29/2014  . Mild tetrahydrocannabinol (THC) abuse 09/29/2014  . Chronic pain syndrome 04/18/2014  . Primary osteoarthritis of left shoulder 04/18/2014  .  Primary osteoarthritis of right knee 04/18/2014  . Primary osteoarthritis of left knee 04/18/2014  . HYPERTENSION, BENIGN ESSENTIAL 03/22/2010  . CONSTIPATION 03/22/2010  . HEPATITIS C  06/04/1995    Past Medical History: Past Medical History:  Diagnosis Date  . Anxiety   . Arthritis   . Constipation   . COPD (chronic obstructive pulmonary disease) ([place])   . DDD (degenerative disc disease), cervical   . DDD (degenerative disc disease), lumbar   . Gout   . Heart murmur    probable bicuspid AV with mild AS, mild MR by 04/22/14 Echo (Dr. Montez Morita)  . Hepatitis C    treated 2016   . History of blood transfusion   . Hypertension   . Pneumonia 12/02/13    Past Surgical History: Past Surgical History:  Procedure Laterality Date  . ABDOMINAL HYSTERECTOMY    . APPENDECTOMY    . BACK SURGERY    . CESAREAN SECTION     x 3  . COLONOSCOPY W/ POLYPECTOMY    . HARDWARE REMOVAL Left 12/26/2015   Procedure: REMOVAL K-WIRE LEFT SCAPULA;  Surgeon: Garald Balding, MD;  Location: [facility];  Service: Orthopedics;  Laterality: Left;  . INNER EAR SURGERY     blood vessel  . TONSILLECTOMY    . TOTAL SHOULDER ARTHROPLASTY Left 12/27/2014   Procedure: TOTAL SHOULDER ARTHROPLASTY;  Surgeon: Garald Balding, MD;  Location: [facility];  Service: Orthopedics;  Laterality: Left;    Social History: Social History   Tobacco Use  . Smoking status: Former Smoker    Packs/day: 1.00    Years: 49.00    Pack years: 49.00    Types: Cigarettes    Quit date: 2007    Years since quitting: 14.8  . Smokeless tobacco: Never Used  . Tobacco comment: quit 2007  Vaping Use  . Vaping Use: Never used  Substance Use Topics  . Alcohol use: No  . Drug use: No    Types: Marijuana    Comment: quit marijuana 12/18. last used heroin and cocaine in 1992.  Smokes Marijuana once a week., 12/22/15- "2 weeks ago"   Additional social history: As above  Please also refer to relevant sections of EMR.  Family History: Family History  Problem Relation Age of Onset  . Heart disease Mother   . Kidney disease Mother   . Alcohol abuse Mother   . Breast cancer Cousin 50    Allergies and  Medications: Allergies  Allergen Reactions  . Gabapentin Other (See Comments)  . Pregabalin     Other reaction(s): Confusion  . Aspirin Other (See Comments)    Due to history of hepatitis  . Penicillins Hives    Has patient had a PCN reaction causing immediate rash, facial/tongue/throat swelling, SOB or lightheadedness with hypotension: Yes Has patient had a PCN reaction causing severe rash involving mucus membranes or skin necrosis: No Has patient had a PCN reaction that required hospitalization No Has patient had a PCN reaction occurring within the last 10 years: No If all of the above answers are "NO", then may proceed with Cephalosporin use.    No current facility-administered medications on file prior to encounter.   Current Outpatient Medications on File Prior to Encounter  Medication Sig Dispense Refill  . acetaminophen (TYLENOL) 500 MG tablet Take 1,000 mg by mouth every 8 (eight) hours as needed for moderate pain.     Marland Kitchen albuterol (PROVENTIL HFA;VENTOLIN HFA) 108 (90 Base) MCG/ACT inhaler Inhale 2 puffs into the  lungs every 2 (two) hours as needed for wheezing or shortness of breath.     . BELBUCA 600 MCG FILM Take 1 Film by mouth in the morning and at bedtime.     . budesonide (PULMICORT) 0.5 MG/2ML nebulizer solution Take 2 mLs (0.5 mg total) by nebulization 2 (two) times daily. (Patient taking differently: Take 0.5 mg by nebulization 2 (two) times daily as needed (wheezing). ) 120 mL 0  . Calcium Carbonate-Vitamin D 600-400 MG-UNIT tablet Take 1 tablet by mouth daily.     . colchicine 0.6 MG tablet Take 0.6 mg by mouth daily as needed (gout flares).     . Cyanocobalamin (VITAMIN B-12 PO) Take 1 tablet by mouth daily.    . cyclobenzaprine (FLEXERIL) 5 MG tablet Take 5 mg by mouth daily as needed for muscle spasms.     Marland Kitchen losartan-hydrochlorothiazide (HYZAAR) 100-25 MG tablet Take 1 tablet by mouth at bedtime.   3  . metFORMIN (GLUMETZA) 500 MG (MOD) 24 hr tablet Take 500 mg by  mouth daily with breakfast.     . Multiple Vitamin (MULTIVITAMIN WITH MINERALS) TABS tablet Take 1 tablet by mouth daily.    Marland Kitchen oxyCODONE-acetaminophen (PERCOCET) 10-325 MG tablet Take 1 tablet by mouth every 4 (four) hours as needed for pain.    . rosuvastatin (CRESTOR) 5 MG tablet Take 5 mg by mouth daily.    . sertraline (ZOLOFT) 50 MG tablet Take 25 mg by mouth daily.     Marland Kitchen tiotropium (SPIRIVA HANDIHALER) 18 MCG inhalation capsule Place 18 mcg into inhaler and inhale daily as needed (shortness of breath).     Marland Kitchen tiZANidine (ZANAFLEX) 2 MG tablet Take 1 tablet (2 mg total) by mouth every 6 (six) hours as needed for muscle spasms. 15 tablet 0  . diclofenac sodium (VOLTAREN) 1 % GEL Apply 2 g topically 4 (four) times daily as needed (lower back pain). (Patient not taking: Reported on 04/19/2020) 1 Tube 0  . DULoxetine (CYMBALTA) 30 MG capsule Take 1 capsule (30 mg total) by mouth daily. (Patient not taking: Reported on 04/19/2020) 30 capsule 1  . neomycin-polymyxin-hydrocortisone (CORTISPORIN) OTIC solution Apply 1-2 drops to toe after soaking twice a day (Patient not taking: Reported on 04/19/2020) 10 mL 0    Objective: BP (!) 149/85   Pulse (!) 112   Temp 100.2 F (37.9 C) (Oral)   Resp (!) 26   Ht 5\' 3"  (1.6 m)   Wt 108.9 kg   SpO2 96%   BMI 42.51 kg/m  Exam: General:  Awake, cooperative obese woman in moderate respiratory distress Eyes: pupils equal, EOM intact Cardiovascular: tachycardic, normal rhythm, S1 and S2 auscultated with difficulty over breath sounds Respiratory: audible rhonchi from doorway, diffuse rhonchi and wheezing in all lung fields, worst in posterior and right Gastrointestinal: bowel sounds present, abdomen grossly distended, mild epigastric tenderness upon palpation, no guarding, no overlying skin changes MSK: moving all extremities equally Derm: areas of hypopigmentation overlying bilateral tibias, no lesions/wounds/rashes Neuro: cranial nerves II-X grossly  intact, moving all extremities spontaneously Psych: oriented to person, place, president; thought the year was 2002, but daughter assured [place] this is her baseline "she's always bad with numbers"  Labs and Imaging: CBC BMET  Recent Labs  Lab 04/19/20 1317  WBC 9.3  HGB 11.8*  HCT 37.3  PLT 179   Recent Labs  Lab 04/19/20 1317  NA 136  K 2.6*  CL 95*  CO2 28  BUN 6*  CREATININE 0.72  GLUCOSE 116*  CALCIUM 10.3     EKG: Sinus tachycardia, no ST elevation, normal axis, narrow QRS  PORTABLE CHEST 1 VIEW COMPARISON:  Prior chest radiograph 12/10/2019 and earlier. FINDINGS: Rounded opacity within the right upper lobe, new as compared to the chest radiographs of 12/10/2019. While this could reflect pneumonia, the morphology is suspicious for a mass and CT chest with contrast is recommended for further evaluation.  CT ANGIOGRAPHY CHEST WITH CONTRAST COMPARISON:  Abdominal ultrasound March 22, 2020 IMPRESSION: Large multifocal areas of airspace consolidation with air bronchogram seen throughout the right lung, likely due to multifocal pneumonia. Followup PA and lateral chest X-ray is recommended in 3-4 weeks following trial of antibiotic therapy to ensure resolution and exclude underlying malignancy.  No central, segmental, or subsegmental pulmonary embolism.  Findings suggestive cirrhosis.  Aortic Atherosclerosis (ICD10-I70.0).   Ezequiel Essex, MD 04/19/2020, 7:45 PM PGY-1, [facility]: [phone], text pages welcome  FPTS Upper-Level Resident Addendum   I have independently interviewed and examined the patient. I have discussed the above with the original author and agree with their documentation. Please see also any attending notes.   Gifford Shave, MD PGY-2, [facility] 04/19/2020 8:14 PM  [facility]: [phone] (text pages welcome through [facility])

## 2020-04-19 NOTE — ED Provider Notes (Signed)
[facility] EMERGENCY DEPARTMENT Provider Note   CSN: [id number] Arrival date & time: 04/19/20  1234     History Chief Complaint  Patient presents with  . Altered Mental Status    Ashley Riddle is a 74 y.o. female with a history of COPD (not on home oxygen), hypertension, prior pneumonia, presenting to emergency department with fever and confusion and hypoxia.  The patient is a poor historian, but her daughter is able to provide history at bedside.  Her daughter says the patient was in normal state of health yesterday when I spoke on the phone.  Today the patient did not make her morning physical therapy appointment, and when the daughter went to check in on her, she found her sitting in her bed, confused with "dried blood around her mouth".  Patient herself denies any shortness of breath, cough, abdominal pain, headache.  She says she feels "fine," but her daughter is concerned that she looks sick.  The patient did receive both Pfizer Covid vaccines but has not received the booster.  The patient does have a history of pneumonia in the past.  The patient is no longer smoker, former smoker was quit several years ago.  Patient's daughter denies that the patient has any history of DVT or PE.    HPI     Past Medical History:  Diagnosis Date  . Anxiety   . Arthritis   . Constipation   . COPD (chronic obstructive pulmonary disease) ([place])   . DDD (degenerative disc disease), cervical   . DDD (degenerative disc disease), lumbar   . Gout   . Heart murmur    probable bicuspid AV with mild AS, mild MR by 04/22/14 Echo (Dr. Montez Morita)  . Hepatitis C    treated 2016   . History of blood transfusion   . Hypertension   . Pneumonia 12/02/13    Patient Active Problem List   Diagnosis Date Noted  . Sepsis ([place]) 04/19/2020  . Trochanteric bursitis, right hip 12/15/2018  . Acute right-sided low back pain 10/06/2018  . COPD with acute exacerbation ([place]) 04/20/2018   . Anxiety and depression 04/20/2018  . CAP (community acquired pneumonia) 02/18/2017  . Liver cirrhosis ([place]) 02/18/2017  . Leukocytosis 02/18/2017  . Sinus tachycardia 02/18/2017  . Tachypnea 02/18/2017  . RLL pneumonia 02/18/2017  . Increased ammonia level 02/18/2017  . Retained metal fragment left scapula 12/26/2015  . Fracture of glenoid process of left scapula 12/29/2014  . Status post total shoulder arthroplasty 12/27/2014  . Cocaine dependence in remission ([place]) 09/29/2014  . Severe heroin dependence in sustained remission ([place]) 09/29/2014  . Opiate abuse, episodic ([place]) 09/29/2014  . Mild tetrahydrocannabinol (THC) abuse 09/29/2014  . Chronic pain syndrome 04/18/2014  . Primary osteoarthritis of left shoulder 04/18/2014  . Primary osteoarthritis of right knee 04/18/2014  . Primary osteoarthritis of left knee 04/18/2014  . HYPERTENSION, BENIGN ESSENTIAL 03/22/2010  . CONSTIPATION 03/22/2010  . HEPATITIS C 06/04/1995    Past Surgical History:  Procedure Laterality Date  . ABDOMINAL HYSTERECTOMY    . APPENDECTOMY    . BACK SURGERY    . CESAREAN SECTION     x 3  . COLONOSCOPY W/ POLYPECTOMY    . HARDWARE REMOVAL Left 12/26/2015   Procedure: REMOVAL K-WIRE LEFT SCAPULA;  Surgeon: Garald Balding, MD;  Location: [facility];  Service: Orthopedics;  Laterality: Left;  . INNER EAR SURGERY     blood vessel  . TONSILLECTOMY    .  TOTAL SHOULDER ARTHROPLASTY Left 12/27/2014   Procedure: TOTAL SHOULDER ARTHROPLASTY;  Surgeon: Garald Balding, MD;  Location: [facility];  Service: Orthopedics;  Laterality: Left;     OB History   No obstetric history on file.     Family History  Problem Relation Age of Onset  . Heart disease Mother   . Kidney disease Mother   . Alcohol abuse Mother   . Breast cancer Cousin 58    Social History   Tobacco Use  . Smoking status: Former Smoker    Packs/day: 1.00    Years: 49.00    Pack years: 49.00    Types: Cigarettes    Quit date: 2007     Years since quitting: 14.8  . Smokeless tobacco: Never Used  . Tobacco comment: quit 2007  Vaping Use  . Vaping Use: Never used  Substance Use Topics  . Alcohol use: No  . Drug use: No    Types: Marijuana    Comment: quit marijuana 12/18. last used heroin and cocaine in 1992.  Smokes Marijuana once a week., 12/22/15- "2 weeks ago"    Home Medications Prior to Admission medications   Medication Sig Start Date End Date Taking? Authorizing Provider  acetaminophen (TYLENOL) 500 MG tablet Take 1,000 mg by mouth every 8 (eight) hours as needed for moderate pain.  12/16/19  Yes [provider]  albuterol (PROVENTIL HFA;VENTOLIN HFA) 108 (90 Base) MCG/ACT inhaler Inhale 2 puffs into the lungs every 2 (two) hours as needed for wheezing or shortness of breath.    Yes [provider]  BELBUCA 600 MCG FILM Take 1 Film by mouth in the morning and at bedtime.  01/17/20  Yes [provider]  budesonide (PULMICORT) 0.5 MG/2ML nebulizer solution Take 2 mLs (0.5 mg total) by nebulization 2 (two) times daily. Patient taking differently: Take 0.5 mg by nebulization 2 (two) times daily as needed (wheezing).  04/21/18 04/19/20 Yes Amin, Jeanella Flattery, MD  Calcium Carbonate-Vitamin D 600-400 MG-UNIT tablet Take 1 tablet by mouth daily.    Yes [provider]  colchicine 0.6 MG tablet Take 0.6 mg by mouth daily as needed (gout flares).    Yes [provider]  Cyanocobalamin (VITAMIN B-12 PO) Take 1 tablet by mouth daily.   Yes [provider]  cyclobenzaprine (FLEXERIL) 5 MG tablet Take 5 mg by mouth daily as needed for muscle spasms.    Yes [provider]  losartan-hydrochlorothiazide (HYZAAR) 100-25 MG tablet Take 1 tablet by mouth at bedtime.  06/12/17  Yes [provider]  metFORMIN (GLUMETZA) 500 MG (MOD) 24 hr tablet Take 500 mg by mouth daily with breakfast.    Yes [provider]  Multiple Vitamin (MULTIVITAMIN WITH MINERALS)  TABS tablet Take 1 tablet by mouth daily.   Yes [provider]  oxyCODONE-acetaminophen (PERCOCET) 10-325 MG tablet Take 1 tablet by mouth every 4 (four) hours as needed for pain.   Yes [provider]  rosuvastatin (CRESTOR) 5 MG tablet Take 5 mg by mouth daily. 12/07/19  Yes [provider]  sertraline (ZOLOFT) 50 MG tablet Take 25 mg by mouth daily.  10/15/18  Yes [provider]  tiotropium (SPIRIVA HANDIHALER) 18 MCG inhalation capsule Place 18 mcg into inhaler and inhale daily as needed (shortness of breath).    Yes [provider]  tiZANidine (ZANAFLEX) 2 MG tablet Take 1 tablet (2 mg total) by mouth every 6 (six) hours as needed for muscle spasms. 10/02/18  Yes Long, Wonda Olds, MD  diclofenac sodium (VOLTAREN) 1 % GEL Apply 2 g topically 4 (four) times daily as needed (lower back pain). Patient not taking: Reported on 04/19/2020 10/02/18   Long, Wonda Olds, MD  DULoxetine (CYMBALTA) 30 MG capsule Take 1 capsule (30 mg total) by mouth daily. Patient not taking: Reported on 04/19/2020 09/29/14 04/19/20  Charlcie Cradle, MD  neomycin-polymyxin-hydrocortisone (CORTISPORIN) OTIC solution Apply 1-2 drops to toe after soaking twice a day Patient not taking: Reported on 04/19/2020 11/10/19   Wallene Huh, DPM    Allergies    Gabapentin, Pregabalin, Aspirin, and Penicillins  Review of Systems   Review of Systems  Constitutional: Positive for fever. Negative for chills.  HENT: Negative for ear pain and sore throat.   Eyes: Negative for pain and visual disturbance.  Respiratory: Negative for cough and shortness of breath.   Cardiovascular: Negative for chest pain and palpitations.  Gastrointestinal: Negative for abdominal pain, diarrhea, nausea and vomiting.  Genitourinary: Negative for dysuria and hematuria.  Musculoskeletal: Negative for arthralgias and back pain.  Skin: Negative for color change and rash.  Neurological: Negative for syncope,  light-headedness and headaches.  Psychiatric/Behavioral: Negative for agitation and confusion.  All other systems reviewed and are negative.   Physical Exam Updated Vital Signs BP (!) 150/83   Pulse (!) 111   Temp 100.3 F (37.9 C) (Oral)   Resp (!) 30   Ht 5\' 3"  (1.6 m)   Wt 108.9 kg   SpO2 95%   BMI 42.51 kg/m   Physical Exam Vitals and nursing note reviewed.  Constitutional:      Appearance: She is well-developed. She is obese.     Comments: Tired, fatigued appearing  HENT:     Head: Normocephalic and atraumatic.  Eyes:     Conjunctiva/sclera: Conjunctivae normal.     Pupils: Pupils are equal, round, and reactive to light.  Cardiovascular:     Rate and Rhythm: Regular rhythm. Tachycardia present.     Pulses: Normal pulses.     Heart sounds: No murmur heard.   Pulmonary:     Effort: Pulmonary effort is normal. No respiratory distress.     Comments: 80% on room air, improved to 93% on 3LNC Abdominal:     General: There is no distension.     Palpations: Abdomen is soft.     Tenderness: There is no abdominal tenderness.     Comments: Obese abdomen  Musculoskeletal:     Cervical back: Neck supple.  Skin:    General: Skin is warm and dry.  Neurological:     General: No focal deficit present.     Mental Status: She is alert and oriented to person, place, and time.     Sensory: No sensory deficit.     Motor: No weakness.     ED Results / Procedures / Treatments   Labs (all labs ordered are listed, but only abnormal results are displayed) Labs Reviewed  LACTIC ACID, PLASMA - Abnormal; Notable for the following components:      Result Value   Lactic Acid, Venous 2.4 (*)    All other components within normal limits  LACTIC ACID, PLASMA - Abnormal; Notable for the following components:   Lactic Acid, Venous 2.8 (*)    All other components within normal limits  COMPREHENSIVE METABOLIC PANEL - Abnormal; Notable for the following components:   Potassium 2.6 (*)     Chloride 95 (*)    Glucose, Bld 116 (*)  BUN 6 (*)    All other components within normal limits  CBC WITH DIFFERENTIAL/PLATELET - Abnormal; Notable for the following components:   Hemoglobin 11.8 (*)    All other components within normal limits  URINALYSIS, ROUTINE W REFLEX MICROSCOPIC - Abnormal; Notable for the following components:   Specific Gravity, Urine 1.033 (*)    Hgb urine dipstick SMALL (*)    All other components within normal limits  D-DIMER, QUANTITATIVE (NOT AT [facility]) - Abnormal; Notable for the following components:   D-Dimer, Quant 4.28 (*)    All other components within normal limits  MAGNESIUM - Abnormal; Notable for the following components:   Magnesium 1.0 (*)    All other components within normal limits  RAPID URINE DRUG SCREEN, HOSP PERFORMED - Abnormal; Notable for the following components:   Opiates POSITIVE (*)    Benzodiazepines POSITIVE (*)    All other components within normal limits  CBG MONITORING, ED - Abnormal; Notable for the following components:   Glucose-Capillary 134 (*)    All other components within normal limits  TROPONIN I (HIGH SENSITIVITY) - Abnormal; Notable for the following components:   Troponin I (High Sensitivity) 61 (*)    All other components within normal limits  RESPIRATORY PANEL BY RT PCR (FLU A&B, COVID)  CULTURE, BLOOD (ROUTINE X 2)  CULTURE, BLOOD (ROUTINE X 2)  URINE CULTURE  PROTIME-INR  APTT    EKG EKG Interpretation  Date/Time:  Wednesday April 19 2020 12:57:33 EST Ventricular Rate:  128 PR Interval:    QRS Duration: 85 QT Interval:  281 QTC Calculation: 410 R Axis:   -13 Text Interpretation: Sinus tachycardia Probable anterior infarct, age indeterminate No STEMI Confirmed by Octaviano Glow [phone]) on 04/19/2020 1:31:52 PM   Radiology CT Angio Chest PE W and/or Wo Contrast  Result Date: 04/19/2020 CLINICAL DATA:  Sepsis chest pain abdominal pain EXAM: CT ANGIOGRAPHY CHEST WITH CONTRAST TECHNIQUE:  Multidetector CT imaging of the chest was performed using the standard protocol during bolus administration of intravenous contrast. Multiplanar CT image reconstructions and MIPs were obtained to evaluate the vascular anatomy. CONTRAST:  121mL OMNIPAQUE IOHEXOL 350 MG/ML SOLN COMPARISON:  Abdominal ultrasound March 22, 2020 FINDINGS: Cardiovascular: There is a optimal opacification of the pulmonary arteries. There is no central,segmental, or subsegmental filling defects within the pulmonary arteries. The heart is normal in size. No pericardial effusion or thickening. No evidence right heart strain. There is normal three-vessel brachiocephalic anatomy without proximal stenosis. The thoracic aorta is normal in appearance. Mediastinum/Nodes: No hilar, mediastinal, or axillary adenopathy. Calcified right hilar lymph nodes are present. There is also scattered subcarinal lymph nodes that are calcified. Thyroid gland, trachea, and esophagus demonstrate no significant findings. Lungs/Pleura: Multifocal rounded airspace consolidations are seen within the right upper lung extending into the right hilum in the posterior right lung base with air bronchograms. The left lung is clear. There is a calcified granulomata seen within the right upper lobe. There is a trace right pleural effusion present. Upper Abdomen: No acute abnormalities present in the visualized portions of the upper abdomen. Musculoskeletal: No chest wall abnormality. No acute or significant osseous findings. Review of the MIP images confirms the above findings. Abdomen/pelvis: Hepatobiliary: Slightly heterogeneous liver parenchyma with a nodular liver contour.The main portal vein is patent. No evidence of calcified gallstones, gallbladder wall thickening or biliary dilatation. Pancreas: Unremarkable. No pancreatic ductal dilatation or surrounding inflammatory changes. Spleen: Normal in size without focal abnormality. Adrenals/Urinary Tract: Both adrenal glands  appear  normal. The kidneys and collecting system appear normal without evidence of urinary tract calculus or hydronephrosis. Bladder is unremarkable. Stomach/Bowel: The stomach, small bowel, and colon are normal in appearance. No inflammatory changes, wall thickening, or obstructive findings.The appendix is normal. Vascular/Lymphatic: There are no enlarged mesenteric, retroperitoneal, or pelvic lymph nodes. Scattered aortic atherosclerotic calcifications are seen without aneurysmal dilatation. Reproductive: The uterus and adnexa are unremarkable. Other: No evidence of abdominal wall mass or hernia. Musculoskeletal: No acute or significant osseous findings. IMPRESSION: Large multifocal areas of airspace consolidation with air bronchogram seen throughout the right lung, likely due to multifocal pneumonia. Followup PA and lateral chest X-ray is recommended in 3-4 weeks following trial of antibiotic therapy to ensure resolution and exclude underlying malignancy. No central, segmental, or subsegmental pulmonary embolism. Findings suggestive cirrhosis. Aortic Atherosclerosis (ICD10-I70.0). Electronically Signed   By: Prudencio Pair M.D.   On: 04/19/2020 17:21   CT ABDOMEN PELVIS W CONTRAST  Result Date: 04/19/2020 CLINICAL DATA:  Sepsis chest pain abdominal pain EXAM: CT ANGIOGRAPHY CHEST WITH CONTRAST TECHNIQUE: Multidetector CT imaging of the chest was performed using the standard protocol during bolus administration of intravenous contrast. Multiplanar CT image reconstructions and MIPs were obtained to evaluate the vascular anatomy. CONTRAST:  1107mL OMNIPAQUE IOHEXOL 350 MG/ML SOLN COMPARISON:  Abdominal ultrasound March 22, 2020 FINDINGS: Cardiovascular: There is a optimal opacification of the pulmonary arteries. There is no central,segmental, or subsegmental filling defects within the pulmonary arteries. The heart is normal in size. No pericardial effusion or thickening. No evidence right heart strain. There is  normal three-vessel brachiocephalic anatomy without proximal stenosis. The thoracic aorta is normal in appearance. Mediastinum/Nodes: No hilar, mediastinal, or axillary adenopathy. Calcified right hilar lymph nodes are present. There is also scattered subcarinal lymph nodes that are calcified. Thyroid gland, trachea, and esophagus demonstrate no significant findings. Lungs/Pleura: Multifocal rounded airspace consolidations are seen within the right upper lung extending into the right hilum in the posterior right lung base with air bronchograms. The left lung is clear. There is a calcified granulomata seen within the right upper lobe. There is a trace right pleural effusion present. Upper Abdomen: No acute abnormalities present in the visualized portions of the upper abdomen. Musculoskeletal: No chest wall abnormality. No acute or significant osseous findings. Review of the MIP images confirms the above findings. Abdomen/pelvis: Hepatobiliary: Slightly heterogeneous liver parenchyma with a nodular liver contour.The main portal vein is patent. No evidence of calcified gallstones, gallbladder wall thickening or biliary dilatation. Pancreas: Unremarkable. No pancreatic ductal dilatation or surrounding inflammatory changes. Spleen: Normal in size without focal abnormality. Adrenals/Urinary Tract: Both adrenal glands appear normal. The kidneys and collecting system appear normal without evidence of urinary tract calculus or hydronephrosis. Bladder is unremarkable. Stomach/Bowel: The stomach, small bowel, and colon are normal in appearance. No inflammatory changes, wall thickening, or obstructive findings.The appendix is normal. Vascular/Lymphatic: There are no enlarged mesenteric, retroperitoneal, or pelvic lymph nodes. Scattered aortic atherosclerotic calcifications are seen without aneurysmal dilatation. Reproductive: The uterus and adnexa are unremarkable. Other: No evidence of abdominal wall mass or hernia.  Musculoskeletal: No acute or significant osseous findings. IMPRESSION: Large multifocal areas of airspace consolidation with air bronchogram seen throughout the right lung, likely due to multifocal pneumonia. Followup PA and lateral chest X-ray is recommended in 3-4 weeks following trial of antibiotic therapy to ensure resolution and exclude underlying malignancy. No central, segmental, or subsegmental pulmonary embolism. Findings suggestive cirrhosis. Aortic Atherosclerosis (ICD10-I70.0). Electronically Signed   By: Ebony Cargo.D.  On: 04/19/2020 17:21   DG Chest Port 1 View  Result Date: 04/19/2020 CLINICAL DATA:  Questionable sepsis, evaluate for abnormality. EXAM: PORTABLE CHEST 1 VIEW COMPARISON:  Prior chest radiograph 12/10/2019 and earlier. FINDINGS: Heart size within normal limits. Shallow inspiration radiograph. New as compared to the prior chest radiograph of 12/10/2019, there is a rounded opacity within the right upper lobe. Unchanged calcified nodule within the right mid lung. The left lung is clear. No evidence of pleural effusion or pneumothorax. No acute bony abnormality is identified. Prior left shoulder arthroplasty. These results were called by telephone at the time of interpretation on 04/19/2020 at 2:32 pm to provider Mariamawit Depaoli , who verbally acknowledged these results. IMPRESSION: Rounded opacity within the right upper lobe, new as compared to the chest radiographs of 12/10/2019. While this could reflect pneumonia, the morphology is suspicious for a mass and CT chest with contrast is recommended for further evaluation. Electronically Signed   By: Kellie Simmering DO   On: 04/19/2020 14:33    Procedures .Critical Care Performed by: Wyvonnia Dusky, MD Authorized by: Wyvonnia Dusky, MD   Critical care provider statement:    Critical care time (minutes):  45   Critical care was necessary to treat or prevent imminent or life-threatening deterioration of the following  conditions:  Sepsis and metabolic crisis   Critical care was time spent personally by me on the following activities:  Discussions with consultants, evaluation of patient's response to treatment, examination of patient, ordering and performing treatments and interventions, ordering and review of laboratory studies, ordering and review of radiographic studies, pulse oximetry, re-evaluation of patient's condition, obtaining history from patient or surrogate and review of old charts Comments:     Hypokalemia, sepsis   (including critical care time)  Medications Ordered in ED Medications  lactated ringers infusion ( Intravenous New Bag/Given 04/19/20 1755)  cefTRIAXone (ROCEPHIN) 2 g in sodium chloride 0.9 % 100 mL IVPB (0 g Intravenous Stopped 04/19/20 1435)  lactated ringers bolus 2,000 mL (0 mLs Intravenous Stopped 04/19/20 1438)  acetaminophen (TYLENOL) tablet 650 mg (650 mg Oral Given 04/19/20 1338)  azithromycin (ZITHROMAX) 500 mg in sodium chloride 0.9 % 250 mL IVPB (0 mg Intravenous Stopped 04/19/20 1559)  potassium chloride 10 mEq in 100 mL IVPB (0 mEq Intravenous Stopped 04/19/20 1745)  iohexol (OMNIPAQUE) 350 MG/ML injection 100 mL (100 mLs Intravenous Contrast Given 04/19/20 1642)  magnesium sulfate IVPB 2 g 50 mL (2 g Intravenous New Bag/Given 04/19/20 1653)    ED Course  I have reviewed the triage vital signs and the nursing notes.  Pertinent labs & imaging results that were available during my care of the patient were reviewed by me and considered in my medical decision making (see chart for details).  This patient complains of fever, shortness of breath, weakness.  This involves an extensive number of treatment options, and is a complaint that carries with it a high risk of complications and morbidity.  The differential diagnosis includes sepsis vs viral URI (including covid) vs anemia vs ACS vs PE vs other  I ordered, reviewed, and interpreted labs, which included UA, CMP (K  2.6), Flu/Covid PCR negative, Mg 1.0, Trop 61, Lactate 2.4, DDimer 4.28, hgb 11.8, WBC 9.3 I ordered medication IV potassium, IV rocephin, IV aizthromycin IV LR fluids for sepsis PNA and hypokalemia I ordered imaging studies which included dg chest, CT scans I independently visualized and interpreted imaging which showed opacity concerning for PNA and the monitor tracing  which showed sinus rhythm Additional history was obtained from patient's daughter at bedside  Unclear from history if she had an episode of hemoptysis or hematemesis, but the former appears more likely from her daughter's report.  CT PE ordered.  With her cirrhosis history there may be an issue with esophageal varices, but at this moment she doesn't appear to be profoundly anemic or losing blood.  I would favor stabilization from an infectious and pulmonary standpoint given her hypoxia and xray findings.  On reassessment, BP stable, no significant change in vital signs.  Pending CT scans.  Clinical Course as of Apr 19 1816  Wed Apr 19, 2020  1412 Per pharmacist, patient has had rocephin in the past without reaction (allergies to PCN).  I stated ok to give again here for CAP   [MT]  1512 Signed out to Dr Ralene Bathe with plan to f/u CT PE and CT abdomen scan.   [MT]    Clinical Course User Index [MT] Wendee Hata, Carola Rhine, MD    Final Clinical Impression(s) / ED Diagnoses Final diagnoses:  Sepsis, due to unspecified organism, unspecified whether acute organ dysfunction present [facility])  Community acquired pneumonia, unspecified laterality    Rx / DC Orders ED Discharge Orders    None       Clista Rainford, Carola Rhine, MD 04/19/20 [phone]

## 2020-04-19 NOTE — Sepsis Progress Note (Signed)
Lactic acid has increased from 2.4 to 2.8. A repeat lactid acid order was placed. Pt in PCU now

## 2020-04-19 NOTE — Sepsis Progress Note (Signed)
Notified bedside nurse of need to draw repeat lactic acid. 

## 2020-04-20 DIAGNOSIS — A419 Sepsis, unspecified organism: Principal | ICD-10-CM

## 2020-04-20 LAB — LACTIC ACID, PLASMA
Lactic Acid, Venous: 1.9 mmol/L (ref 0.5–1.9)
Lactic Acid, Venous: 1.8 mmol/L (ref 0.5–1.9)

## 2020-04-20 LAB — CBC
HCT: 34.9 % — ABNORMAL LOW (ref 36.0–46.0)
Hemoglobin: 11.1 g/dL — ABNORMAL LOW (ref 12.0–15.0)
MCH: 27.6 pg (ref 26.0–34.0)
MCHC: 31.8 g/dL (ref 30.0–36.0)
MCV: 86.8 fL (ref 80.0–100.0)
Platelets: 146 10*3/uL — ABNORMAL LOW (ref 150–400)
RBC: 4.02 MIL/uL (ref 3.87–5.11)
RDW: 13.1 % (ref 11.5–15.5)
WBC: 10.6 10*3/uL — ABNORMAL HIGH (ref 4.0–10.5)
nRBC: 0.2 % (ref 0.0–0.2)

## 2020-04-20 LAB — COMPREHENSIVE METABOLIC PANEL
ALT: 14 U/L (ref 0–44)
AST: 21 U/L (ref 15–41)
Albumin: 3.2 g/dL — ABNORMAL LOW (ref 3.5–5.0)
Alkaline Phosphatase: 49 U/L (ref 38–126)
Anion gap: 8 (ref 5–15)
BUN: 5 mg/dL — ABNORMAL LOW (ref 8–23)
CO2: 30 mmol/L (ref 22–32)
Calcium: 9.7 mg/dL (ref 8.9–10.3)
Chloride: 99 mmol/L (ref 98–111)
Creatinine, Ser: 0.59 mg/dL (ref 0.44–1.00)
GFR, Estimated: >60 mL/min (ref >60)
Glucose, Bld: 99 mg/dL (ref 70–99)
Potassium: 3.8 mmol/L (ref 3.5–5.1)
Sodium: 137 mmol/L (ref 135–145)
Total Bilirubin: 1.1 mg/dL (ref 0.3–1.2)
Total Protein: 6.6 g/dL (ref 6.5–8.1)

## 2020-04-20 LAB — URINE CULTURE: Culture: NO GROWTH

## 2020-04-20 LAB — MAGNESIUM: Magnesium: 1.4 mg/dL — ABNORMAL LOW (ref 1.7–2.4)

## 2020-04-20 LAB — PHOSPHORUS: Phosphorus: 2.3 mg/dL — ABNORMAL LOW (ref 2.5–4.6)

## 2020-04-20 LAB — PROCALCITONIN: Procalcitonin: 8.93 ng/mL

## 2020-04-20 LAB — PROTIME-INR
INR: 1.3 — ABNORMAL HIGH (ref 0.8–1.2)
Prothrombin Time: 15.7 seconds — ABNORMAL HIGH (ref 11.4–15.2)

## 2020-04-20 MED ORDER — K PHOS MONO-SOD PHOS DI & MONO 155-852-130 MG PO TABS
500.0000 mg | ORAL_TABLET | Freq: Once | ORAL | Status: DC
  Filled 2020-04-20: qty 2

## 2020-04-20 MED ORDER — K PHOS MONO-SOD PHOS DI & MONO 155-852-130 MG PO TABS
500.0000 mg | ORAL_TABLET | Freq: Three times a day (TID) | ORAL | Status: AC
  Administered 2020-04-20 (×2): 500 mg via ORAL
  Filled 2020-04-20 (×2): qty 2

## 2020-04-20 MED ORDER — ENOXAPARIN SODIUM 60 MG/0.6ML ~~LOC~~ SOLN
55.0000 mg | SUBCUTANEOUS | Status: DC
  Administered 2020-04-20 – 2020-04-21 (×2): 55 mg via SUBCUTANEOUS
  Filled 2020-04-20 (×2): qty 0.6

## 2020-04-20 MED ORDER — MAGNESIUM SULFATE 2 GM/50ML IV SOLN
2.0000 g | Freq: Once | INTRAVENOUS | Status: AC
  Administered 2020-04-20: 2 g via INTRAVENOUS
  Filled 2020-04-20: qty 50

## 2020-04-20 MED ORDER — ACETAMINOPHEN 500 MG PO TABS
500.0000 mg | ORAL_TABLET | Freq: Four times a day (QID) | ORAL | Status: DC
  Administered 2020-04-20 – 2020-04-22 (×8): 500 mg via ORAL
  Filled 2020-04-20 (×7): qty 1

## 2020-04-20 NOTE — Progress Notes (Addendum)
Family Medicine Teaching Service Daily Progress Note Intern Pager: [phone]  Patient name: Ashley Riddle Medical record number: [id number] Date of birth: 1945-10-28 Age: 74 y.o. Gender: female  Primary Care Provider: Audley Hose, MD Consultants: None Code Status: Full  Pt Overview and Major Events to Date:  Admitted 04/19/2020  Assessment and Plan: Ashley Riddle is a 74 y.o. female presenting with sepsis due to multifocal pneumonia of the right mid and posterior lobes. PMH is significant for COPD, liver cirrhosis, hepatitis C, anxiety and depression, hypertension, chronic pain syndrome, osteoarthritis of both knees s/p TKA left knee July 2021, remote history of drug abuse (cocaine, heroin, opiate, THC)  Sepsis 2/2 multifocal pneumonia of right lung Febrile this morning to 101.0 F despite beginning azithromycin and Rocephin.  Will be scheduling Tylenol for pain (as she normally receives Norco 3 times daily at home).  Will be careful to monitor temperature while on scheduled Tylenol; if she begins to fever through the Tylenol, will consider broadening antibiotic coverage. -Wean LR maintenance fluids as she increases p.o. intake -Continue Rocephin daily -Continue azithromycin daily x5 days, currently day #2 (11/17-21) -Troponin trending flat, will not continue to follow -Blood culture no growth less than 24 hours -Urine culture pending -Follow-up morning CBC, CMP, mag, phosphorus, procalcitonin, pro time INR -Close monitoring of respiratory status, concern that the patient may wear out. -PT/OT eval and treat  Chest pain Troponin trending flat, 61 > 50 > 50.  Chest pain unchanged from yesterday.  Suspect musculoskeletal and multifocal pneumonia in origin. -Continue to monitor  Hypokalemia Potassium 3.8 this morning s/p 40 mEq p.o. supplementation overnight.  No need for repletion today.  Also receiving K-Phos for hypophosphatemia. -We will continue to  follow  Hypomagnesemia  Magnesium 1.4 this morning.  Repleted with 2 g IVPB. -Follow-up a.m. mag  Hypophosphatemia Phosphorus 2.3 this morning.  Ordered K-Phos repletion 3 times daily for today only. -Follow-up a.m. Phos  Hypertension At home, on losartan-hydrochlorothiazide 100-25 mg daily at bedtime.  BP last 24 hours (122-156)/(74-88), last 152/85 with 115 pulse. -Inpatient we will continue 50 mg losartan daily -Decline HCTZ initiation at this time -Vitals per unit protocol  Liver cirrhosis  hepatitis C Last saw [name], NP in [facility] care [facility] transplant clinic [facility] on 03/15/2020 for outpatient visit.  No meds currently. -Monitor  COPD At home, prescribed Pulmicort twice daily (patient taking as needed), Spiriva daily as needed, and albuterol inhaler every 2 as needed. -Continue Pulmicort twice daily -Continue Spiriva daily as needed -Albuterol every 6 as needed  Chronic pain   History of substance abuse with cocaine, heroin, opiate, THC UDS positive for both opiates and benzos.  Currently seen in the pain clinic, has pain contract.  Takes Norco 10-325 three times daily in addition to Belbuca 600 mcg film.  Also prescribed Zanaflex 2 mg every 6 as needed and Flexeril 5 mg daily as needed.  Complained of back pain while in ED, saying she had not taken any pain medicine all day. -Will continue Belbuca -Start oxycodone IR 10 every 6 as needed -Monitor mental status as well as respiratory status, may need to decrease dose/discontinue opiate pain medications -Decline Norco inpatient due to acetaminophen combo  Depression and anxiety At home, prescribed duloxetine 30 mg daily and Zoloft 25 mg daily.  Patient reports taking Zoloft only. -Will continue Zoloft  Osteoarthritis of bilateral knees, left shoulder S/p left TKA with orthopedist July 2021.  Chronic pain as detailed above.  -  Up with assistance  Gout At home, prescribed colchicine 0.6 mg daily as  needed for gout flares.  Per patient, has not needed in several months. -Monitor  FEN/GI: N.p.o. until passed swallow test Prophylaxis: Lovenox  Disposition: Progressive  Status is: Inpatient  Remains inpatient appropriate because:Persistent severe electrolyte disturbances, Ongoing active pain requiring inpatient pain management, IV treatments appropriate due to intensity of illness or inability to take PO and Inpatient level of care appropriate due to severity of illness   Dispo: The patient is from: Home              Anticipated d/c is to: Home              Anticipated d/c date is: 3 days              Patient currently is not medically stable to d/c.   Subjective:  Ashley Riddle was found laying in bed this morning with her nurse at the bedside.  She was awake and slouched in bed.  Her complaints this morning are from from pain; she reports continuation of that same substernal chest and epigastric pain as admission along with chronic back and bilateral knee pain.  She reports that the chest pain has not changed, is located in the center of her chest, can point with 2 fingers the subxiphoid region, worse with palpitation.  Reports that her breathing "is really really bad".  Denies specific complaints of shortness of breath, dizziness, crushing chest pain, radiating chest pain.   Objective: Temp:  [98.6 F (37 C)-102.7 F (39.3 C)] 99.7 F (37.6 C) (11/18 1400) Pulse Rate:  [106-119] 115 (11/18 0730) Resp:  [12-33] 20 (11/18 0730) BP: (122-156)/(74-88) 152/85 (11/18 0730) SpO2:  [91 %-97 %] 97 % (11/18 0746)    Physical Exam General: Awake, alert, oriented to person and place (congruent with yesterday) Cardiovascular: Regular rate and rhythm, S1 and S2 present, no murmurs auscultated Respiratory: Audible rhonchi from bedside, inspiratory and expiratory rhonchi in all lung fields R >L Abdomen: Distended abdomen with moderate TTP in epigastric region, bowel sounds present, no  overlying skin changes (congruent with yesterday) Extremities: Trace bilateral lower extremity edema, palpable pedal and pretibial pulses bilaterally Neuro: Cranial nerves II through X grossly intact, able to move all extremities spontaneously   Laboratory: Recent Labs  Lab 04/19/20 1317 04/20/20 0430  WBC 9.3 10.6*  HGB 11.8* 11.1*  HCT 37.3 34.9*  PLT 179 146*   Recent Labs  Lab 04/19/20 1317 04/20/20 0430  NA 136 137  K 2.6* 3.8  CL 95* 99  CO2 28 30  BUN 6* 5*  CREATININE 0.72 0.59  CALCIUM 10.3 9.7  PROT 7.4 6.6  BILITOT 0.9 1.1  ALKPHOS 57 49  ALT 14 14  AST 25 21  GLUCOSE 116* 99    Imaging/Diagnostic Tests:  Portable CXR 1 view 04/19/2020 IMPRESSION: Rounded opacity within the right upper lobe, new as compared to the chest radiographs of 12/10/2019. While this could reflect pneumonia, the morphology is suspicious for a mass and CT chest with contrast is recommended for further evaluation.  CTA chest PE, CT abdomen pelvis 04/19/2020 IMPRESSION: Large multifocal areas of airspace consolidation with air bronchogram seen throughout the right lung, likely due to multifocal pneumonia. Followup PA and lateral chest X-ray is recommended in 3-4 weeks following trial of antibiotic therapy to ensure resolution and exclude underlying malignancy.  No central, segmental, or subsegmental pulmonary embolism.  Findings suggestive cirrhosis.  Aortic Atherosclerosis (ICD10-I70.0).  Ezequiel Essex, MD 04/20/2020, 2:28 PM PGY-1, [place] Intern pager: [phone], text pages welcome

## 2020-04-20 NOTE — Progress Notes (Signed)
   04/20/20 0259  Assess: MEWS Score  Temp 99.1 F (37.3 C)  BP 139/79  Pulse Rate (!) 108  ECG Heart Rate (!) 109  Resp (!) 21  Level of Consciousness Alert  SpO2 94 %  O2 Device Nasal Cannula  O2 Flow Rate (L/min) 3 L/min  Assess: MEWS Score  MEWS Temp 0  MEWS Systolic 0  MEWS Pulse 1  MEWS RR 1  MEWS LOC 0  MEWS Score 2  MEWS Score Color Yellow  Assess: if the MEWS score is Yellow or Red  Were vital signs taken at a resting state? Yes  Focused Assessment No change from prior assessment  Early Detection of Sepsis Score *See Row Information* Medium  MEWS guidelines implemented *See Row Information* No, altered LOC is baseline  Treat  MEWS Interventions Administered scheduled meds/treatments  Pain Scale 0-10  Pain Score Asleep  Take Vital Signs  Increase Vital Sign Frequency  Yellow: Q 2hr X 2 then Q 4hr X 2, if remains yellow, continue Q 4hrs  Escalate  MEWS: Escalate Yellow: discuss with charge nurse/RN and consider discussing with provider and RRT  Notify: Charge Nurse/RN  Name of Charge Nurse/RN Notified Phil RN  Date Charge Nurse/RN Notified 04/20/20  Time Charge Nurse/RN Notified 0300  Document  Patient Outcome Other (Comment)  Progress note created (see row info) Yes (Patientis stable. No change from base line.)

## 2020-04-20 NOTE — Progress Notes (Signed)
Occupational Therapy Evaluation Patient Details Name: Ashley Riddle MRN: [id number] DOB: 10-16-1945 Today's Date: 04/20/2020    History of Present Illness 74 y.o. female presenting to ED with confusion, fever adn hypoxia. Pt with sepsis due to multifocal pneumonia of the right mid and posterior lobes. PMH is significant for COPD, liver cirrhosis, hepatitis C, anxiety and depression, hypertension, chronic pain syndrome, osteoarthritis of both knees s/p TKA left knee July 2021, TSA, 2016  remote history of drug abuse (cocaine, heroin, opiate, THC)   Clinical Impression   PTA, pt lived alone and was independent with ADL and mobility, drove and helped her daughter with her daycare. Pt currently requires min A with mobility and ADL tasks due to deficits listed below. Completed session on RA with SpO2 desat to 87 on exertion with 2/4 DOE, other VSS. Pt with large diarrhea episode during session - nsg aware. Pt is hopeful to resume outpt PT for her knee, however at this time, recommend 24/7 S and HHOT. Pt states family will be able to assist after DC. Will follow acutely and adjust plan of care as appropriate.     Follow Up Recommendations  Home health OT;Supervision/Assistance - 24 hour    Equipment Recommendations  None recommended by OT    Recommendations for Other Services PT consult     Precautions / Restrictions Precautions Precautions: Fall      Mobility Bed Mobility Overal bed mobility: Needs Assistance Bed Mobility: Supine to Sit     Supine to sit: Min guard (heavy use of rails)          Transfers Overall transfer level: Needs assistance   Transfers: Sit to/from Stand;Stand Pivot Transfers Sit to Stand: Min assist Stand pivot transfers: Min assist       General transfer comment: steady assist    Balance Overall balance assessment: Needs assistance   Sitting balance-Leahy Scale: Good       Standing balance-Leahy Scale: Fair                              ADL either performed or assessed with clinical judgement   ADL Overall ADL's : Needs assistance/impaired Eating/Feeding: Modified independent   Grooming: Set up;Sitting   Upper Body Bathing: Set up;Sitting   Lower Body Bathing: Minimal assistance;Sit to/from stand   Upper Body Dressing : Set up;Sitting   Lower Body Dressing: Minimal assistance;Sit to/from stand   Toilet Transfer: Minimal Production assistant, radio Details (indicate cue type and reason): HHA Toileting- Clothing Manipulation and Hygiene: Min guard;Sit to/from stand;Sitting/lateral lean       Functional mobility during ADLs: Minimal assistance;Cueing for safety General ADL Comments: unsteady; decreased activity tolerance     Vision Baseline Vision/History: Wears glasses Wears Glasses:  (as needed) Additional Comments: strabismus at baseline     Perception     Praxis      Pertinent Vitals/Pain Pain Assessment: Faces Faces Pain Scale: Hurts even more Pain Location: back; R upper arm from being "poked with needles" Pain Descriptors / Indicators: Grimacing;Guarding;Moaning;Aching Pain Intervention(s): Limited activity within patient's tolerance;Other (comment) (RN made aware)     Hand Dominance Right   Extremity/Trunk Assessment Upper Extremity Assessment Upper Extremity Assessment: Generalized weakness;RUE deficits/detail RUE Deficits / Details: ROM WFL; complaining of pain above IV site - nrsing made aware   Lower Extremity Assessment Lower Extremity Assessment: Defer to PT evaluation (hx of L TKA in July)   Cervical / Trunk Assessment Cervical /  Trunk Assessment: Normal (complaining of back pain)   Communication Communication Communication: No difficulties   Cognition Arousal/Alertness: Awake/alert Behavior During Therapy: WFL for tasks assessed/performed Overall Cognitive Status: Impaired/Different from baseline Area of Impairment: Attention;Awareness                    Current Attention Level: Selective       Awareness: Emergent   General Comments: slow processing however improved as session progressed; able to talk with the nurse about her medicaitons, discuss going to outpt and why she preferred that over [place]       Exercises Exercises: Other exercises Other Exercises Other Exercises: encouraged use of incentive spirometer Other Exercises: pursed lip breathing   Shoulder Instructions      Home Living Family/patient expects to be discharged to:: Private residence Living Arrangements: Alone;Children;Other relatives Available Help at Discharge: Available 24 hours/day Type of Home: House Home Access: Level entry     Home Layout: Two level;Able to live on main level with bedroom/bathroom     Bathroom Shower/Tub: Occupational psychologist: Standard Bathroom Accessibility: Yes How Accessible: Accessible via walker Home Equipment: [place] - 2 wheels;Bedside commode;Walker - 4 wheels;Shower seat;Grab bars - tub/shower          Prior Functioning/Environment Level of Independence: Independent        Comments: drives; helps her duaghter with her daycare "holding the babies"; currently going to outpt PT for her knees        OT Problem List: Decreased strength;Decreased activity tolerance;Impaired balance (sitting and/or standing);Decreased cognition;Decreased safety awareness;Decreased knowledge of use of DME or AE;Cardiopulmonary status limiting activity;Obesity;Pain      OT Treatment/Interventions: Self-care/ADL training;Therapeutic exercise;Energy conservation;DME and/or AE instruction;Therapeutic activities;Cognitive remediation/compensation;Patient/family education;Balance training    OT Goals(Current goals can be found in the care plan section) Acute Rehab OT Goals Patient Stated Goal: to get better adn go home OT Goal Formulation: With patient Time For Goal Achievement: 05/04/20 Potential  to Achieve Goals: Good  OT Frequency: Min 2X/week   Barriers to D/C:            Co-evaluation              AM-PAC OT "6 Clicks" Daily Activity     Outcome Measure Help from another person eating meals?: None Help from another person taking care of personal grooming?: A Little Help from another person toileting, which includes using toliet, bedpan, or urinal?: A Little Help from another person bathing (including washing, rinsing, drying)?: A Little Help from another person to put on and taking off regular upper body clothing?: A Little Help from another person to put on and taking off regular lower body clothing?: A Little 6 Click Score: 19   End of Session Equipment Utilized During Treatment: Gait belt;Oxygen (2L) Nurse Communication: Mobility status  Activity Tolerance: Patient tolerated treatment well Patient left: in chair;with call bell/phone within reach;with chair alarm set  OT Visit Diagnosis: Unsteadiness on feet (R26.81);Other abnormalities of gait and mobility (R26.89);Muscle weakness (generalized) (M62.81);Other symptoms and signs involving cognitive function;Pain Pain - Right/Left: Right Pain - part of body: Arm (back)                Time: 2831-5176 OT Time Calculation (min): 40 min Charges:  OT General Charges $OT Visit: 1 Visit OT Evaluation $OT Eval Moderate Complexity: 1 Mod OT Treatments $Self Care/Home Management : 23-37 mins  Kariss Longmire, OT/L   Acute OT Clinical Specialist Acute  Rehabilitation Services Pager [phone] Office [phone]   [facility] 04/20/2020, 3:47 PM

## 2020-04-20 NOTE — Plan of Care (Signed)
  Problem: Activity: Goal: Ability to tolerate increased activity will improve Outcome: Progressing   Problem: Clinical Measurements: Goal: Ability to maintain a body temperature in the normal range will improve Outcome: Progressing   Problem: Respiratory: Goal: Ability to maintain adequate ventilation will improve Outcome: Progressing Goal: Ability to maintain a clear airway will improve Outcome: Progressing   Problem: Clinical Measurements: Goal: Ability to maintain a body temperature in the normal range will improve Outcome: Progressing   Problem: Respiratory: Goal: Ability to maintain adequate ventilation will improve Outcome: Progressing Goal: Ability to maintain a clear airway will improve Outcome: Progressing

## 2020-04-20 NOTE — Progress Notes (Signed)
Patient's temp reached 101.4; patient used incentive spirometer 10 times and temp came down to 100.1. Will continue to encourage patient every hour to use spirometer. Called the Dr. On call and discussed findings.

## 2020-04-20 NOTE — Evaluation (Signed)
Physical Therapy Evaluation Patient Details Name: Ashley Riddle MRN: [id number] DOB: 1946-01-14 Today's Date: 04/20/2020   History of Present Illness  74 y.o. female presenting to ED with confusion, fever adn hypoxia. Pt with sepsis due to multifocal pneumonia of the right mid and posterior lobes. PMH is significant for COPD, liver cirrhosis, hepatitis C, anxiety and depression, hypertension, chronic pain syndrome, osteoarthritis of both knees s/p TKA left knee July 2021, TSA, 2016  remote history of drug abuse (cocaine, heroin, opiate, THC)  Clinical Impression  Pt admitted with above diagnosis. Pt requiring min cues for safety and min A at times for transfers.  She did require O2 in order to maintain O2 saturation with activity.  Pt fatigues easily and required rest break between transfers.  Pt is motivated and was getting outpt PT prior to admission.  Pt with family support and necessary DME. Pt currently with functional limitations due to the deficits listed below (see PT Problem List). Pt will benefit from skilled PT to increase their independence and safety with mobility to allow discharge to the venue listed below.       Follow Up Recommendations Home health PT;Outpatient PT (Could possibly benefit from HHPT but pt states would rather return to outpt PT if able)    Equipment Recommendations  None recommended by PT    Recommendations for Other Services       Precautions / Restrictions Precautions Precautions: Fall      Mobility  Bed Mobility Overal bed mobility: Needs Assistance Bed Mobility: Sit to Supine     Supine to sit: Min assist Sit to supine: Min guard   General bed mobility comments: pulled up to sit on therapist hand; increased time    Transfers Overall transfer level: Needs assistance   Transfers: Sit to/from Stand;Stand Pivot Transfers Sit to Stand: Supervision;Min assist Stand pivot transfers: Supervision       General transfer comment: min A  initially to stand but then able to do with supervision; performed x 3 throughout session; PT assisted with ADLs due to lines  Ambulation/Gait Ambulation/Gait assistance: Min guard Gait Distance (Feet): 150 Feet Assistive device: Rolling walker (2 wheeled) Gait Pattern/deviations: Step-through pattern;Decreased stride length Gait velocity: decreased   General Gait Details: min cues for RW proximity and control in tight areas; DOE 2/4  Financial trader Rankin (Stroke Patients Only)       Balance Overall balance assessment: Needs assistance   Sitting balance-Leahy Scale: Good     Standing balance support: No upper extremity supported;Bilateral upper extremity supported Standing balance-Leahy Scale: Fair Standing balance comment: RW for ambulation ;static without AD                             Pertinent Vitals/Pain Pain Assessment: No/denies pain    Home Living Family/patient expects to be discharged to:: Private residence Living Arrangements: Alone;Children;Other relatives Available Help at Discharge: Available 24 hours/day Type of Home: House Home Access: Level entry     Home Layout: Two level;Able to live on main level with bedroom/bathroom Home Equipment: [place] Rile - 2 wheels;Bedside commode;Walker - 4 wheels;Shower seat;Grab bars - tub/shower      Prior Function Level of Independence: Independent         Comments: drives; helps her daughter with her daycare "holding the babies"; currently going to outpt PT for her knees  Hand Dominance   Dominant Hand: Right    Extremity/Trunk Assessment   Upper Extremity Assessment Upper Extremity Assessment: Defer to OT evaluation    Lower Extremity Assessment Lower Extremity Assessment: LLE deficits/detail;RLE deficits/detail RLE Deficits / Details: ROM WFL; grossly MMT 4+ to 5/5 LLE Deficits / Details: ROM WFL; grossly MMT 4+ to 5/5    Cervical / Trunk  Assessment Cervical / Trunk Assessment: Normal  Communication   Communication: No difficulties  Cognition Arousal/Alertness: Awake/alert Behavior During Therapy: WFL for tasks assessed/performed Overall Cognitive Status: Impaired/Different from baseline Area of Impairment: Attention;Awareness;Memory                   Current Attention Level: Selective Memory: Decreased short-term memory     Awareness: Emergent   General Comments: decreased memory -repeatedly asked therapy and staff 7-8 time to place purewick in a 20 min time span      General Comments General comments (skin integrity, edema, etc.): Pt on 2 LPM O2 with O2 sats 95% or greater with activity.  OT had attempted RA earlier and sats decreased so did not attempt with PT    Exercises     Assessment/Plan    PT Assessment Patient needs continued PT services  PT Problem List Decreased strength;Decreased mobility;Decreased safety awareness;Decreased activity tolerance;Cardiopulmonary status limiting activity;Decreased balance;Decreased knowledge of use of DME       PT Treatment Interventions DME instruction;Therapeutic activities;Gait training;Therapeutic exercise;Patient/family education;Stair training;Balance training;Functional mobility training    PT Goals (Current goals can be found in the Care Plan section)  Acute Rehab PT Goals Patient Stated Goal: get stronger and go home PT Goal Formulation: With patient Time For Goal Achievement: 05/04/20 Potential to Achieve Goals: Good    Frequency Min 3X/week   Barriers to discharge        Co-evaluation               AM-PAC PT "6 Clicks" Mobility  Outcome Measure Help needed turning from your back to your side while in a flat bed without using bedrails?: A Little Help needed moving from lying on your back to sitting on the side of a flat bed without using bedrails?: A Little Help needed moving to and from a bed to a chair (including a wheelchair)?:  None Help needed standing up from a chair using your arms (e.g., wheelchair or bedside chair)?: None Help needed to walk in hospital room?: A Little Help needed climbing 3-5 steps with a railing? : A Little 6 Click Score: 20    End of Session Equipment Utilized During Treatment: Gait belt;Oxygen Activity Tolerance: Patient tolerated treatment well Patient left: in bed;with call bell/phone within reach;with bed alarm set Nurse Communication: Mobility status PT Visit Diagnosis: Other abnormalities of gait and mobility (R26.89);Muscle weakness (generalized) (M62.81)    Time: 4888-9169 PT Time Calculation (min) (ACUTE ONLY): 31 min   Charges:   PT Evaluation $PT Eval Moderate Complexity: 1 Mod PT Treatments $Therapeutic Activity: 8-22 mins        Abran Richard, PT Acute Rehab Services Pager [phone] [facility] Rehab [phone]    Karlton Lemon 04/20/2020, 5:04 PM

## 2020-04-21 DIAGNOSIS — A419 Sepsis, unspecified organism: Secondary | ICD-10-CM | POA: Diagnosis not present

## 2020-04-21 LAB — CBC WITH DIFFERENTIAL/PLATELET
Abs Immature Granulocytes: 0.04 10*3/uL (ref 0.00–0.07)
Basophils Absolute: 0 10*3/uL (ref 0.0–0.1)
Basophils Relative: 0 %
Eosinophils Absolute: 0 10*3/uL (ref 0.0–0.5)
Eosinophils Relative: 0 %
HCT: 30.7 % — ABNORMAL LOW (ref 36.0–46.0)
Hemoglobin: 9.9 g/dL — ABNORMAL LOW (ref 12.0–15.0)
Immature Granulocytes: 0 %
Lymphocytes Relative: 13 %
Lymphs Abs: 1.2 10*3/uL (ref 0.7–4.0)
MCH: 28.3 pg (ref 26.0–34.0)
MCHC: 32.2 g/dL (ref 30.0–36.0)
MCV: 87.7 fL (ref 80.0–100.0)
Monocytes Absolute: 0.6 10*3/uL (ref 0.1–1.0)
Monocytes Relative: 6 %
Neutro Abs: 7.5 10*3/uL (ref 1.7–7.7)
Neutrophils Relative %: 81 %
Platelets: 144 10*3/uL — ABNORMAL LOW (ref 150–400)
RBC: 3.5 MIL/uL — ABNORMAL LOW (ref 3.87–5.11)
RDW: 13.1 % (ref 11.5–15.5)
WBC: 9.4 10*3/uL (ref 4.0–10.5)
nRBC: 0 % (ref 0.0–0.2)

## 2020-04-21 LAB — BASIC METABOLIC PANEL
Anion gap: 7 (ref 5–15)
BUN: 6 mg/dL — ABNORMAL LOW (ref 8–23)
CO2: 29 mmol/L (ref 22–32)
Calcium: 9.4 mg/dL (ref 8.9–10.3)
Chloride: 102 mmol/L (ref 98–111)
Creatinine, Ser: 0.51 mg/dL (ref 0.44–1.00)
GFR, Estimated: >60 mL/min (ref >60)
Glucose, Bld: 109 mg/dL — ABNORMAL HIGH (ref 70–99)
Potassium: 3.3 mmol/L — ABNORMAL LOW (ref 3.5–5.1)
Sodium: 138 mmol/L (ref 135–145)

## 2020-04-21 LAB — PHOSPHORUS: Phosphorus: 2.3 mg/dL — ABNORMAL LOW (ref 2.5–4.6)

## 2020-04-21 LAB — MAGNESIUM: Magnesium: 1.5 mg/dL — ABNORMAL LOW (ref 1.7–2.4)

## 2020-04-21 MED ORDER — AZITHROMYCIN 250 MG PO TABS
250.0000 mg | ORAL_TABLET | Freq: Every evening | ORAL | Status: DC
  Administered 2020-04-21: 250 mg via ORAL
  Filled 2020-04-21 (×2): qty 1

## 2020-04-21 MED ORDER — POTASSIUM CHLORIDE CRYS ER 20 MEQ PO TBCR
40.0000 meq | EXTENDED_RELEASE_TABLET | Freq: Once | ORAL | Status: AC
  Administered 2020-04-21: 40 meq via ORAL
  Filled 2020-04-21: qty 2

## 2020-04-21 MED ORDER — CEFDINIR 300 MG PO CAPS
300.0000 mg | ORAL_CAPSULE | Freq: Two times a day (BID) | ORAL | Status: DC
  Administered 2020-04-21 – 2020-04-22 (×2): 300 mg via ORAL
  Filled 2020-04-21 (×3): qty 1

## 2020-04-21 MED ORDER — MAGNESIUM SULFATE 2 GM/50ML IV SOLN
2.0000 g | Freq: Once | INTRAVENOUS | Status: AC
  Administered 2020-04-21: 2 g via INTRAVENOUS
  Filled 2020-04-21: qty 50

## 2020-04-21 MED ORDER — K PHOS MONO-SOD PHOS DI & MONO 155-852-130 MG PO TABS
500.0000 mg | ORAL_TABLET | Freq: Three times a day (TID) | ORAL | Status: AC
  Administered 2020-04-21 (×3): 500 mg via ORAL
  Filled 2020-04-21 (×3): qty 2

## 2020-04-21 MED ORDER — POTASSIUM CHLORIDE 10 MEQ/100ML IV SOLN: 10.0000 meq | Freq: Once | INTRAVENOUS | Status: DC

## 2020-04-21 NOTE — TOC Initial Note (Signed)
Transition of Care [facility]) - Initial/Assessment Note    Patient Details  Name: Ashley Riddle MRN: [id number] Date of Birth: 11-03-1945  Transition of Care [facility]) CM/SW Contact:    Pollie Friar, RN Phone Number: 04/21/2020, 11:28 AM  Clinical Narrative:                 Pt lives with daughter who she states can provide 24 hour supervision. Therapy recommendations are for [facility] services but the patient prefers outpatient. She has attended at [place] and wants to attend there again. CM updated her that they do not have OT at that location and she states she prefers to attend there since she has experience with them.  TOC following.  Expected Discharge Plan: OP Rehab Barriers to Discharge: Continued Medical Work up   Patient Goals and CMS Choice     Choice offered to / list presented to : Patient  Expected Discharge Plan and Services Expected Discharge Plan: OP Rehab   Discharge Planning Services: CM Consult   Living arrangements for the past 2 months: Single Family Home                                      Prior Living Arrangements/Services Living arrangements for the past 2 months: Single Family Home Lives with:: Adult Children Patient language and need for interpreter reviewed:: Yes Do you feel safe going back to the place where you live?: Yes      Need for Family Participation in Patient Care: Yes (Comment) Care giver support system in place?: Yes (comment) Current home services: DME (nebulizer/ cane/ walker/ 3 in 1/ shower chair/ CPAP) Criminal Activity/Legal Involvement Pertinent to Current Situation/Hospitalization: No - Comment as needed  Activities of Daily Living Home Assistive Devices/Equipment: CPAP, Nebulizer ADL Screening (condition at time of admission) Patient's cognitive ability adequate to safely complete daily activities?: Yes Is the patient deaf or have difficulty hearing?: No Does the patient have difficulty seeing, even when wearing  glasses/contacts?: No Does the patient have difficulty concentrating, remembering, or making decisions?: No Patient able to express need for assistance with ADLs?: Yes Does the patient have difficulty dressing or bathing?: Yes Independently performs ADLs?: No Communication: Independent Dressing (OT): Needs assistance Is this a change from baseline?: Pre-admission baseline Grooming: Needs assistance Is this a change from baseline?: Pre-admission baseline Feeding: Independent Bathing: Needs assistance Is this a change from baseline?: Pre-admission baseline Toileting: Needs assistance Is this a change from baseline?: Pre-admission baseline In/Out Bed: Needs assistance Is this a change from baseline?: Pre-admission baseline Walks in Home: Needs assistance Is this a change from baseline?: Pre-admission baseline Does the patient have difficulty walking or climbing stairs?: Yes Weakness of Legs: Both Weakness of Arms/Hands: None  Permission Sought/Granted                  Emotional Assessment Appearance:: Appears stated age Attitude/Demeanor/Rapport: Engaged Affect (typically observed): Accepting Orientation: : Oriented to Self, Oriented to Place, Oriented to  Time, Oriented to Situation   Psych Involvement: No (comment)  Admission diagnosis:  Sepsis ([place]) [A41.9] Community acquired pneumonia, unspecified laterality [J18.9] Sepsis, due to unspecified organism, unspecified whether acute organ dysfunction present [facility]) [A41.9] Patient Active Problem List   Diagnosis Date Noted  . Sepsis ([place]) 04/19/2020  . Trochanteric bursitis, right hip 12/15/2018  . Acute right-sided low back pain 10/06/2018  . COPD with acute exacerbation ([place])  04/20/2018  . Anxiety and depression 04/20/2018  . CAP (community acquired pneumonia) 02/18/2017  . Liver cirrhosis ([place]) 02/18/2017  . Leukocytosis 02/18/2017  . Sinus tachycardia 02/18/2017  . Tachypnea 02/18/2017  . RLL pneumonia 02/18/2017   . Increased ammonia level 02/18/2017  . Retained metal fragment left scapula 12/26/2015  . Fracture of glenoid process of left scapula 12/29/2014  . Status post total shoulder arthroplasty 12/27/2014  . Cocaine dependence in remission ([place]) 09/29/2014  . Severe heroin dependence in sustained remission ([place]) 09/29/2014  . Opiate abuse, episodic ([place]) 09/29/2014  . Mild tetrahydrocannabinol (THC) abuse 09/29/2014  . Chronic pain syndrome 04/18/2014  . Primary osteoarthritis of left shoulder 04/18/2014  . Primary osteoarthritis of right knee 04/18/2014  . Primary osteoarthritis of left knee 04/18/2014  . HYPERTENSION, BENIGN ESSENTIAL 03/22/2010  . CONSTIPATION 03/22/2010  . HEPATITIS C 06/04/1995   PCP:  Audley Hose, MD Pharmacy:   [id number] - [place], [place] [address] [place] [address] Phone: [phone] Fax: [phone]     Social Determinants of Health (SDOH) Interventions    Readmission Risk Interventions No flowsheet data found.

## 2020-04-21 NOTE — Hospital Course (Signed)
   Indications for avoiding sedating medications given aging and aspirational pneumonia.

## 2020-04-21 NOTE — Progress Notes (Addendum)
Family Medicine Teaching Service Daily Progress Note Intern Pager: [phone]  Patient name: Ashley Riddle Medical record number: [id number] Date of birth: 01-10-1946 Age: 74 y.o. Gender: female  Primary Care Provider: Audley Hose, MD Consultants: None Code Status: Full  Pt Overview and Major Events to Date:  Admitted 11/17  Assessment and Plan: Malachi Suderman Thompsonis a 74 y.o.femalepresenting with sepsis due to multifocal pneumonia of the right mid and posterior lobes. PMH is significant forCOPD, liver cirrhosis, hepatitis C, anxiety and depression, hypertension, chronic pain syndrome, osteoarthritis of both knees s/p TKA left knee July 2021, remote history of drug abuse (cocaine, heroin, opiate, THC)  Sepsis 2/2 multifocal pneumonia of right lung Febrile once overnight at 101 F, however nurse unsure of true accuracy of that reading.  Apparently had patient do incentive spirometry immediately after febrile rating, then remeasured temperature at 100 F.  Will follow temperature very closely today.  Lactic acid downtrending, 1.8 today (from admission 2.8 > 1.9 > 1.8). -If febrile today, will expand antibiotic coverage by adding Flagyl -Convert IV Rocephin to p.o. cefdinir -Switch azithromycin to p.o., still daily x5 days, currently day #3 (11/17-21) -DC IV fluids -Troponin trended flat, 50x2.  Declined to repeat unless clinically indicated. -Blood culture no growth <24 hours -Urine culture no growth -Follow-up morning CBC, CMP, mag, phosphorus, procalcitonin, pro time INR -Close monitoring of respiratory status, concern that the patient may wear out. -PT/OT eval and treat -Wean oxygen to room air as tolerated, goal SPO2 with COPD is 88 to 92% -Expect discharge tomorrow morning  Chest pain Troponin trended flat, 50x2.  Declined to repeat unless clinically indicated. Suspect musculoskeletal and multifocal pneumonia in origin. -Continue to  monitor  Hypokalemia Potassium 3.3 this morning, ordered 40 mEq K-Dur tablets. -Follow-up a.m. CMP  Hypomagnesemia Magnesium this morning 1.5. -Ordered repletion with 2 g IVPB -Follow-up a.m. mag  Hypophosphatemia Phosphorus this morning 2.3. -Ordered K-Phos packets 3 times daily for today -Follow-up a.m. Phos  Hypertension BP last 24 hours (115-145)/68-1 04), last 135/83 pulse 112.  Pulses ranged 65-112.  At home, on losartan-hydrochlorothiazide 100-25 mg daily at bedtime.   -Inpatient we will continue 50 mg losartan daily -Decline HCTZ initiation at this time -Vitals per unit protocol  Liver cirrhosishepatitis C Last sawDawn Drazek, NPin [facility] [place] on 03/15/2020 for outpatient visit. No meds currently. -Monitor  COPD At home, prescribed Pulmicort twice daily (patient taking as needed), Spiriva daily as needed, and albuterol inhaler every 2 as needed. -Continue Pulmicort twice daily -Continue Spiriva daily as needed -Albuterol every 6 as needed  Chronic painHistory of substance abuse with cocaine, heroin, opiate, THC UDS positive for both opiates and benzos. Currently seen in the pain clinic, has pain contract. Takes Norco 10-325threetimes daily in addition to Belbuca 600 mcg film.Also prescribedZanaflex 2 mg every 6 as needed and Flexeril 5 mg daily as needed. Concern for aspiration pneumonia secondary to highly sedative medications.  Discussed with patient today about talking to her doctors about reducing the number of highly sedative meds given her age and comorbidities. -Will continue Belbuca -Oxycodone IR 10 every 6 as needed -Monitor mental status as well as respiratory status, may need to decrease dose/discontinue opiate pain medications -Decline Norco inpatient due to acetaminophen combo  Depression and anxiety At home, prescribed duloxetine 30 mg daily and Zoloft 25 mg daily. Patient reports taking  Zoloft only. -Will continue Zoloft  Osteoarthritis of bilateral knees, left shoulder S/p left TKA with orthopedistJuly  2021. Chronic pain as detailed above. -Up with assistance  Gout At home, prescribed colchicine 0.6 mg daily as needed for gout flares. Per patient, has not needed in several months. -Monitor  FEN/GI:N.p.o. until passed swallow test Prophylaxis:Lovenox   Status is: Inpatient  Remains inpatient appropriate because:Hemodynamically unstable, Persistent severe electrolyte disturbances, IV treatments appropriate due to intensity of illness or inability to take PO and Inpatient level of care appropriate due to severity of illness   Dispo: The patient is from: Home              Anticipated d/c is to: Home              Anticipated d/c date is: 3 days              Patient currently is not medically stable to d/c.    Subjective:  Patient was found resting comfortably in recliner upon entering the room.  She was performing her incentive spirometry.  She reports that her chest pain previously reported has now ceased.  Says that she did not sleep well but that this is congruent with her baseline.  Overall says she feels much better than when she came in.  Reports that her breathing "is much better".  No specific complaints.  Objective: Temp:  [98.6 F (37 C)-101 F (38.3 C)] 98.9 F (37.2 C) (11/19 0405) Pulse Rate:  [65-112] 96 (11/19 0405) Resp:  [16-22] 18 (11/19 0405) BP: (115-145)/(68-104) 126/82 (11/19 0405) SpO2:  [93 %-100 %] 98 % (11/19 8768) Physical Exam: General: Obese female in no acute distress Cardiovascular: Systolic murmur in pulmonary valve auscultation location, regular rate and rhythm Respiratory: Mild to moderate rhonchi in anterior lung fields, much improved from admission Abdomen: Soft, moderately distended, nontender (congruent with exam previous days) Extremities: No BLE edema, moving all extremities spontaneously and  equally  Laboratory: Recent Labs  Lab 04/19/20 1317 04/20/20 0430 04/21/20 0308  WBC 9.3 10.6* 9.4  HGB 11.8* 11.1* 9.9*  HCT 37.3 34.9* 30.7*  PLT 179 146* 144*   Recent Labs  Lab 04/19/20 1317 04/20/20 0430 04/21/20 0308  NA 136 137 138  K 2.6* 3.8 3.3*  CL 95* 99 102  CO2 28 30 29   BUN 6* 5* 6*  CREATININE 0.72 0.59 0.51  CALCIUM 10.3 9.7 9.4  PROT 7.4 6.6  --   BILITOT 0.9 1.1  --   ALKPHOS 57 49  --   ALT 14 14  --   AST 25 21  --   GLUCOSE 116* 99 109*    Imaging/Diagnostic Tests: No results found.   Ezequiel Essex, MD 04/21/2020, 8:28 AM PGY-1, [place] Intern pager: [phone], text pages welcome

## 2020-04-21 NOTE — Progress Notes (Addendum)
Occupational Therapy Treatment Note Pt is making steady progress. Able to ambulate safely to the bathroom with S with VSS @ RW level. Completing ADL with set up/S with improved activity tolerance and increased safety awareness.  Pt will have 24/7 S at home after DC. Will continue to follow acutely. No further OT recommended at this time after DC. Pt to follow up with outpt PT after DC.   04/21/20 1402  OT Visit Information  Last OT Received On 04/21/20  Assistance Needed +1  History of Present Illness 74 y.o. female presenting to ED with confusion, fever adn hypoxia. Pt with sepsis due to multifocal pneumonia of the right mid and posterior lobes. PMH is significant for COPD, liver cirrhosis, hepatitis C, anxiety and depression, hypertension, chronic pain syndrome, osteoarthritis of both knees s/p TKA left knee July 2021, TSA, 2016  remote history of drug abuse (cocaine, heroin, opiate, THC)  Precautions  Precautions Fall  Pain Assessment  Pain Assessment 0-10  Pain Score 7  Pain Location back  Pain Descriptors / Indicators Aching  Pain Intervention(s) Limited activity within patient's tolerance  Cognition  Arousal/Alertness Awake/alert  Behavior During Therapy WFL for tasks assessed/performed  Overall Cognitive Status No family/caregiver present to determine baseline cognitive functioning  Area of Impairment Memory;Safety/judgement  Current Attention Level Selective  Memory Decreased short-term memory (most likely at baeline)  Safety/Judgement Decreased awareness of safety  Awareness Anticipatory  General Comments Improved as compared to yesterday's session  Upper Extremity Assessment  Upper Extremity Assessment Generalized weakness  ADL  Overall ADL's  Needs assistance/impaired  Grooming Modified independent  Upper Body Bathing Set up;Sitting  Lower Body Bathing Supervison/ safety;Set up;Sit to/from stand  Lower Body Dressing Supervision/safety;Set up;Sit to/from Environmental education officer;Ambulation;Regular Toilet;RW  Toileting- Clothing Manipulation and Hygiene Modified independent  Functional mobility during ADLs Supervision/safety;Rolling walker  Bed Mobility  Overal bed mobility Modified Independent  Balance  Overall balance assessment Needs assistance  Sitting-balance support Feet supported;No upper extremity supported  Sitting balance-Leahy Scale Good  Sitting balance - Comments Able to perform pericare seated  Standing balance support During functional activity  Standing balance-Leahy Scale Fair  Standing balance comment RW for ambulation ;static without AD  Transfers  Overall transfer level Needs assistance  Equipment used Rolling walker (2 wheeled)  Transfers Sit to/from Stand  Sit to Stand Supervision  General Comments  General comments (skin integrity, edema, etc.) Sp02 remained in mid 90s on RA today during session.Began education on energy conservation and reducing risk of falls   Other Exercises  Other Exercises pursed lip breathing  OT - End of Session  Equipment Utilized During Treatment Gait belt;Rolling walker  Activity Tolerance Patient tolerated treatment well  Patient left in bed;with call bell/phone within reach;with bed alarm set  Nurse Communication Mobility status;Patient requests pain meds  OT Assessment/Plan  OT Plan Discharge plan needs to be updated  OT Visit Diagnosis Unsteadiness on feet (R26.81);Other abnormalities of gait and mobility (R26.89);Muscle weakness (generalized) (M62.81);Other symptoms and signs involving cognitive function;Pain  Pain - part of body  (back)  OT Frequency (ACUTE ONLY) Min 2X/week  Recommendations for Other Services PT consult  Follow Up Recommendations No OT follow up;Supervision/Assistance - 24 hour  OT Equipment None recommended by OT  AM-PAC OT "6 Clicks" Daily Activity Outcome Measure (Version 2)  Help from another person eating meals? 4  Help from another person taking  care of personal grooming? 4  Help from another person toileting, which includes using toliet,  bedpan, or urinal? 3  Help from another person bathing (including washing, rinsing, drying)? 3  Help from another person to put on and taking off regular upper body clothing? 3  Help from another person to put on and taking off regular lower body clothing? 3  6 Click Score 20  OT Goal Progression  Progress towards OT goals Progressing toward goals  Acute Rehab OT Goals  Patient Stated Goal get stronger and go home  OT Goal Formulation With patient  Time For Goal Achievement 05/04/20  Potential to Achieve Goals Good  ADL Goals  Pt Will Perform Lower Body Bathing with modified independence;sit to/from stand  Pt Will Perform Lower Body Dressing with modified independence;sit to/from stand  Pt Will Transfer to Toilet ambulating;with modified independence  Pt Will Perform Toileting - Clothing Manipulation and hygiene with modified independence;sit to/from stand  Additional ADL Goal #1 Pt will independently verbalize 3 energy conservation stratgies  Additional ADL Goal #2 Pt will demonstrate anticpatory awareness during ADL task  OT Time Calculation  OT Start Time (ACUTE ONLY) 1427  OT Stop Time (ACUTE ONLY) 1450  OT Time Calculation (min) 23 min  OT General Charges  $OT Visit 1 Visit  OT Treatments  $Self Care/Home Management  23-37 mins   Maurie Boettcher, OT/L   Acute OT Clinical Specialist [place] Pager [phone] Office [phone]

## 2020-04-21 NOTE — Progress Notes (Signed)
Physical Therapy Treatment Patient Details Name: Ashley Riddle MRN: [id number] DOB: 06-Jan-1946 Today's Date: 04/21/2020    History of Present Illness 74 y.o. female presenting to ED with confusion, fever adn hypoxia. Pt with sepsis due to multifocal pneumonia of the right mid and posterior lobes. PMH is significant for COPD, liver cirrhosis, hepatitis C, anxiety and depression, hypertension, chronic pain syndrome, osteoarthritis of both knees s/p TKA left knee July 2021, TSA, 2016  remote history of drug abuse (cocaine, heroin, opiate, THC)    PT Comments    Patient reports feeling tired today due to sitting up most of the day. Agreeable to short distance ambulation within room to/from bathroom with Min guard assist and use of RW for support. No dyspnea on exertion noted today. Sp02 remained in mid 90s on RA throughout session. Pt eager to return home tomorrow. Will follow.   Follow Up Recommendations  Outpatient PT;Supervision for mobility/OOB     Equipment Recommendations  None recommended by PT    Recommendations for Other Services       Precautions / Restrictions Precautions Precautions: Fall Restrictions Weight Bearing Restrictions: No    Mobility  Bed Mobility Overal bed mobility: Needs Assistance Bed Mobility: Sit to Supine       Sit to supine: Min guard;HOB elevated   General bed mobility comments: Increased time but able to get LEs into bed to return to supine without assist. HOB ~25 degrees.  Transfers Overall transfer level: Needs assistance Equipment used: Rolling walker (2 wheeled) Transfers: Sit to/from Stand Sit to Stand: Min guard         General transfer comment: Min guard to steady in standing wtih therapist supporting RW prior to standing per pt request, stood from chair x1, from toilet x1. Wide BoS.  Ambulation/Gait Ambulation/Gait assistance: Min guard Gait Distance (Feet): 15 Feet (x2 bouts) Assistive device: Rolling walker (2  wheeled) Gait Pattern/deviations: Step-through pattern;Decreased stride length;Wide base of support Gait velocity: decreased   General Gait Details: Slow, mildly unsteady gait with wide BoS and RW for support. No DOE. Sp02 remained 94% on RA.   Stairs             Wheelchair Mobility    Modified Rankin (Stroke Patients Only)       Balance Overall balance assessment: Needs assistance Sitting-balance support: Feet supported;No upper extremity supported Sitting balance-Leahy Scale: Good Sitting balance - Comments: Able to perform pericare seated   Standing balance support: During functional activity Standing balance-Leahy Scale: Fair Standing balance comment: RW for ambulation ;static without AD                            Cognition Arousal/Alertness: Awake/alert Behavior During Therapy: WFL for tasks assessed/performed Overall Cognitive Status: Impaired/Different from baseline Area of Impairment: Attention;Awareness                   Current Attention Level: Selective       Awareness: Emergent   General Comments: Requires repetition at times to follow commands, question HOH. Distractable.      Exercises      General Comments General comments (skin integrity, edema, etc.): Sp02 remained in mid 90s on RA today during session. Daughter present in room during session.      Pertinent Vitals/Pain Pain Assessment: No/denies pain    Home Living  Prior Function            PT Goals (current goals can now be found in the care plan section) Progress towards PT goals: Progressing toward goals    Frequency    Min 3X/week      PT Plan Current plan remains appropriate    Co-evaluation              AM-PAC PT "6 Clicks" Mobility   Outcome Measure  Help needed turning from your back to your side while in a flat bed without using bedrails?: A Little Help needed moving from lying on your back to sitting on  the side of a flat bed without using bedrails?: A Little Help needed moving to and from a bed to a chair (including a wheelchair)?: A Little Help needed standing up from a chair using your arms (e.g., wheelchair or bedside chair)?: None Help needed to walk in hospital room?: A Little Help needed climbing 3-5 steps with a railing? : A Little 6 Click Score: 19    End of Session Equipment Utilized During Treatment: Gait belt Activity Tolerance: Patient limited by fatigue Patient left: in bed;with call bell/phone within reach;with family/visitor present Nurse Communication: Mobility status PT Visit Diagnosis: Other abnormalities of gait and mobility (R26.89);Muscle weakness (generalized) (M62.81)     Time: 9323-5573 PT Time Calculation (min) (ACUTE ONLY): 15 min  Charges:  $Therapeutic Activity: 8-22 mins                     Ashley Riddle, PT, DPT Acute Rehabilitation Services Pager [phone] Office [phone]       Ashley Riddle 04/21/2020, 2:07 PM

## 2020-04-22 DIAGNOSIS — A419 Sepsis, unspecified organism: Secondary | ICD-10-CM | POA: Diagnosis not present

## 2020-04-22 LAB — PHOSPHORUS: Phosphorus: 3 mg/dL (ref 2.5–4.6)

## 2020-04-22 LAB — BASIC METABOLIC PANEL
Anion gap: 11 (ref 5–15)
BUN: 6 mg/dL — ABNORMAL LOW (ref 8–23)
CO2: 28 mmol/L (ref 22–32)
Calcium: 9.4 mg/dL (ref 8.9–10.3)
Chloride: 99 mmol/L (ref 98–111)
Creatinine, Ser: 0.42 mg/dL — ABNORMAL LOW (ref 0.44–1.00)
GFR, Estimated: >60 mL/min (ref >60)
Glucose, Bld: 112 mg/dL — ABNORMAL HIGH (ref 70–99)
Potassium: 3.2 mmol/L — ABNORMAL LOW (ref 3.5–5.1)
Sodium: 138 mmol/L (ref 135–145)

## 2020-04-22 LAB — MAGNESIUM: Magnesium: 1.6 mg/dL — ABNORMAL LOW (ref 1.7–2.4)

## 2020-04-22 MED ORDER — CEFDINIR 300 MG PO CAPS: 300.0000 mg | ORAL_CAPSULE | Freq: Two times a day (BID) | ORAL | 0 refills | Status: AC

## 2020-04-22 MED ORDER — AZITHROMYCIN 250 MG PO TABS: 250.0000 mg | ORAL_TABLET | Freq: Every evening | ORAL | 0 refills | Status: AC

## 2020-04-22 MED ORDER — POTASSIUM CHLORIDE CRYS ER 20 MEQ PO TBCR
40.0000 meq | EXTENDED_RELEASE_TABLET | ORAL | Status: DC
  Administered 2020-04-22: 40 meq via ORAL
  Filled 2020-04-22: qty 2

## 2020-04-22 MED ORDER — MAGNESIUM SULFATE 2 GM/50ML IV SOLN
2.0000 g | Freq: Once | INTRAVENOUS | Status: AC
  Administered 2020-04-22: 2 g via INTRAVENOUS
  Filled 2020-04-22: qty 50

## 2020-04-22 NOTE — Progress Notes (Signed)
Family Medicine Teaching Service Daily Progress Note Intern Pager: [phone]  Patient name: Ashley Riddle Medical record number: [id number] Date of birth: February 05, 1946 Age: 74 y.o. Gender: female  Primary Care Provider: Audley Hose, MD Consultants: None Code Status: Full  Pt Overview and Major Events to Date:  Admitted 11/17  Assessment and Plan: Shaquel Josephson Thompsonis a 74 y.o.femalepresenting with sepsis due to multifocal pneumonia of the right mid and posterior lobes. PMH is significant forCOPD, liver cirrhosis, hepatitis C, anxiety and depression, hypertension, chronic pain syndrome, osteoarthritis of both knees s/p TKA left knee July 2021, remote history of drug abuse (cocaine, heroin, opiate, THC)  Multifocal pneumonia of right lung Sepsis (resolved) Patient appears well today. No respiratory distress. Plans for discharge today. IV antibiotics were transitioned to PO yesterday.   -Convert IV Rocephin to p.o. cefdinir -Switch azithromycin to p.o., still daily x5 days, currently day #3 (11/17-21)  Hypokalemia Potassium 3.2 this morning -replete 40 mg KDUR x 2  Hypomagnesemia 1.6 -replete w/ 2g IV Mg   Hypertension Stable.  - resume home meds at discharge   COPD At home, prescribed Pulmicort twice daily (patient taking as needed), Spiriva daily as needed, and albuterol inhaler every 2 as needed. - resume home meds at discharge   Chronic pain History of substance abuse with cocaine, heroin, opiate, THC During her admission, discussed with patient today about talking to her doctors about reducing the number of highly sedative meds given her age and comorbidities. - resume home meds at discharge    FEN/GI:N.p.o. until passed swallow test Prophylaxis:Lovenox  Dispo: Home with PT today   Subjective:  Doing well this morning. No complaints   Objective: Temp:  [98.2 F (36.8 C)-99.7 F (37.6 C)] 99.1 F (37.3 C) (11/20 0422) Pulse Rate:   [81-112] 81 (11/20 0422) Resp:  [14-18] 18 (11/20 0422) BP: (126-183)/(59-89) 126/59 (11/20 0422) SpO2:  [91 %-100 %] 100 % (11/20 0422) Physical Exam: General: NAD, non-toxic, well-appearing, sitting comfortably in bed   HEENT: [place]/AT. PERRLA. EOMI.  Cardiovascular: RRR, normal S1, S2. B/L 2+ RP. No BLEE Respiratory: CTAB. No IWOB.  Abdomen: + BS. NT, ND, soft to palpation.  Extremities: Warm and well perfused. Moving spontaneously.  Integumentary: No obvious rashes, lesions, trauma on general exam.  Laboratory: Recent Labs  Lab 04/19/20 1317 04/20/20 0430 04/21/20 0308  WBC 9.3 10.6* 9.4  HGB 11.8* 11.1* 9.9*  HCT 37.3 34.9* 30.7*  PLT 179 146* 144*   Recent Labs  Lab 04/19/20 1317 04/19/20 1317 04/20/20 0430 04/21/20 0308 04/22/20 0222  NA 136   < > 137 138 138  K 2.6*   < > 3.8 3.3* 3.2*  CL 95*   < > 99 102 99  CO2 28   < > 30 29 28   BUN 6*   < > 5* 6* 6*  CREATININE 0.72   < > 0.59 0.51 0.42*  CALCIUM 10.3   < > 9.7 9.4 9.4  PROT 7.4  --  6.6  --   --   BILITOT 0.9  --  1.1  --   --   ALKPHOS 57  --  49  --   --   ALT 14  --  14  --   --   AST 25  --  21  --   --   GLUCOSE 116*   < > 99 109* 112*   < > = values in this interval not displayed.    Imaging/Diagnostic Tests: No  results found.   Wilber Oliphant, MD 04/22/2020, 7:51 AM PGY-3, [place] Intern pager: [phone], text pages welcome

## 2020-04-22 NOTE — Discharge Summary (Addendum)
[facility] Discharge Summary  Patient name: Ashley Riddle Medical record number: [id number] Date of birth: 10/08/1945 Age: 74 y.o. Gender: female Date of Admission: 04/19/2020  Date of Discharge: 04/22/20    Admitting Physician: Ezequiel Essex, MD  Primary Care Provider: Audley Hose, MD Consultants: [place] [name] Chafe [phone])  Indication for Hospitalization: Severe Sepsis   Discharge Diagnoses/Problem List:  Principal Problem:   Severe sepsis [facility]) Active Problems:   HYPERTENSION, BENIGN ESSENTIAL   Chronic pain syndrome   Opiate abuse, episodic ([place])   CAP (community acquired pneumonia)   Liver cirrhosis ([place])   COPD with acute exacerbation ([place])   Sepsis ([place])  Disposition: Discharge to home  Discharge Condition: Good  Discharge Exam:  See daily progress note for exam  Brief Hospital Course:  Ashley Riddle is a 74 y.o. female with past medical history significant for COPD, liver cirrhosis, hepatitis C, anxiety and depression, hypertension, chronic pain syndrome, remote history of polysubstance abuse who presented with fever, tachycardia, tachypnea and radiographic and physical exam findings of multifocal pneumonia. Initial lactic acid 2.4 > 2.8 on admission meeting criteria for severe sepsis.  Findings concerning for aspiration pneumonia given high number of sedating medications that patient is on outpatient as well as location of pneumonia.  Patient was empirically treated with ceftriaxone and azithromycin.  Patient continued to improve and ceftriaxone was transitioned to cefdinir p.o.  She was ultimately discharged with p.o. antibiotics (cefdinir x10 days, azithromycin x5 days). Patient also reported chest pain upon admission.  Troponins trended flat 61, 50, 50.  Chest pain was attributed to musculoskeletal findings in the setting of multifocal pneumonia. Patient had persistent hypokalemia and hypomagnesemia  that required repletion daily.  She should follow-up outpatient with her PCP for further evaluation. Patient was seen by physical therapy and Occupational Therapy.  Occupational Therapy recommended home health OT.  Patient declined home health as she has an outpatient physical therapist that she would like to continue going to.  Issues for Follow Up:  1. Improvement of respiratory status and pneumonia.  Completion of antibiotics. 2. Discuss high number of sedating medications which may have led to aspiration pneumonia.  Significant Procedures:   Procedure Orders     Critical Care     EKG 12-Lead     ED EKG 12-Lead  Significant Labs and Imaging:  Recent Labs  Lab 04/19/20 1317 04/20/20 0430 04/21/20 0308  WBC 9.3 10.6* 9.4  HGB 11.8* 11.1* 9.9*  HCT 37.3 34.9* 30.7*  PLT 179 146* 144*   Recent Labs  Lab 04/19/20 1317 04/19/20 1317 04/20/20 0430 04/20/20 0430 04/21/20 0308 04/21/20 0800 04/22/20 0222  NA 136  --  137  --  138  --  138  K 2.6*   < > 3.8   < > 3.3*  --  3.2*  CL 95*  --  99  --  102  --  99  CO2 28  --  30  --  29  --  28  GLUCOSE 116*  --  99  --  109*  --  112*  BUN 6*  --  5*  --  6*  --  6*  CREATININE 0.72  --  0.59  --  0.51  --  0.42*  CALCIUM 10.3  --  9.7  --  9.4  --  9.4  MG 1.0*  --  1.4*  --   --  1.5* 1.6*  PHOS  --   --  2.3*  --   --  2.3* 3.0  ALKPHOS 57  --  49  --   --   --   --   AST 25  --  21  --   --   --   --   ALT 14  --  14  --   --   --   --   ALBUMIN 3.8  --  3.2*  --   --   --   --    < > = values in this interval not displayed.    CT Angio Chest PE W and/or Wo Contrast  Result Date: 04/19/2020 CLINICAL DATA:  Sepsis chest pain abdominal pain EXAM: CT ANGIOGRAPHY CHEST WITH CONTRAST TECHNIQUE: Multidetector CT imaging of the chest was performed using the standard protocol during bolus administration of intravenous contrast. Multiplanar CT image reconstructions and MIPs were obtained to evaluate the vascular anatomy.  CONTRAST:  139mL OMNIPAQUE IOHEXOL 350 MG/ML SOLN COMPARISON:  Abdominal ultrasound March 22, 2020 FINDINGS: Cardiovascular: There is a optimal opacification of the pulmonary arteries. There is no central,segmental, or subsegmental filling defects within the pulmonary arteries. The heart is normal in size. No pericardial effusion or thickening. No evidence right heart strain. There is normal three-vessel brachiocephalic anatomy without proximal stenosis. The thoracic aorta is normal in appearance. Mediastinum/Nodes: No hilar, mediastinal, or axillary adenopathy. Calcified right hilar lymph nodes are present. There is also scattered subcarinal lymph nodes that are calcified. Thyroid gland, trachea, and esophagus demonstrate no significant findings. Lungs/Pleura: Multifocal rounded airspace consolidations are seen within the right upper lung extending into the right hilum in the posterior right lung base with air bronchograms. The left lung is clear. There is a calcified granulomata seen within the right upper lobe. There is a trace right pleural effusion present. Upper Abdomen: No acute abnormalities present in the visualized portions of the upper abdomen. Musculoskeletal: No chest wall abnormality. No acute or significant osseous findings. Review of the MIP images confirms the above findings. Abdomen/pelvis: Hepatobiliary: Slightly heterogeneous liver parenchyma with a nodular liver contour.The main portal vein is patent. No evidence of calcified gallstones, gallbladder wall thickening or biliary dilatation. Pancreas: Unremarkable. No pancreatic ductal dilatation or surrounding inflammatory changes. Spleen: Normal in size without focal abnormality. Adrenals/Urinary Tract: Both adrenal glands appear normal. The kidneys and collecting system appear normal without evidence of urinary tract calculus or hydronephrosis. Bladder is unremarkable. Stomach/Bowel: The stomach, small bowel, and colon are normal in appearance.  No inflammatory changes, wall thickening, or obstructive findings.The appendix is normal. Vascular/Lymphatic: There are no enlarged mesenteric, retroperitoneal, or pelvic lymph nodes. Scattered aortic atherosclerotic calcifications are seen without aneurysmal dilatation. Reproductive: The uterus and adnexa are unremarkable. Other: No evidence of abdominal wall mass or hernia. Musculoskeletal: No acute or significant osseous findings. IMPRESSION: Large multifocal areas of airspace consolidation with air bronchogram seen throughout the right lung, likely due to multifocal pneumonia. Followup PA and lateral chest X-ray is recommended in 3-4 weeks following trial of antibiotic therapy to ensure resolution and exclude underlying malignancy. No central, segmental, or subsegmental pulmonary embolism. Findings suggestive cirrhosis. Aortic Atherosclerosis (ICD10-I70.0). Electronically Signed   By: Prudencio Pair M.D.   On: 04/19/2020 17:21   CT ABDOMEN PELVIS W CONTRAST  Result Date: 04/19/2020 CLINICAL DATA:  Sepsis chest pain abdominal pain EXAM: CT ANGIOGRAPHY CHEST WITH CONTRAST TECHNIQUE: Multidetector CT imaging of the chest was performed using the standard protocol during bolus administration of intravenous contrast. Multiplanar CT image reconstructions and MIPs  were obtained to evaluate the vascular anatomy. CONTRAST:  151mL OMNIPAQUE IOHEXOL 350 MG/ML SOLN COMPARISON:  Abdominal ultrasound March 22, 2020 FINDINGS: Cardiovascular: There is a optimal opacification of the pulmonary arteries. There is no central,segmental, or subsegmental filling defects within the pulmonary arteries. The heart is normal in size. No pericardial effusion or thickening. No evidence right heart strain. There is normal three-vessel brachiocephalic anatomy without proximal stenosis. The thoracic aorta is normal in appearance. Mediastinum/Nodes: No hilar, mediastinal, or axillary adenopathy. Calcified right hilar lymph nodes are present.  There is also scattered subcarinal lymph nodes that are calcified. Thyroid gland, trachea, and esophagus demonstrate no significant findings. Lungs/Pleura: Multifocal rounded airspace consolidations are seen within the right upper lung extending into the right hilum in the posterior right lung base with air bronchograms. The left lung is clear. There is a calcified granulomata seen within the right upper lobe. There is a trace right pleural effusion present. Upper Abdomen: No acute abnormalities present in the visualized portions of the upper abdomen. Musculoskeletal: No chest wall abnormality. No acute or significant osseous findings. Review of the MIP images confirms the above findings. Abdomen/pelvis: Hepatobiliary: Slightly heterogeneous liver parenchyma with a nodular liver contour.The main portal vein is patent. No evidence of calcified gallstones, gallbladder wall thickening or biliary dilatation. Pancreas: Unremarkable. No pancreatic ductal dilatation or surrounding inflammatory changes. Spleen: Normal in size without focal abnormality. Adrenals/Urinary Tract: Both adrenal glands appear normal. The kidneys and collecting system appear normal without evidence of urinary tract calculus or hydronephrosis. Bladder is unremarkable. Stomach/Bowel: The stomach, small bowel, and colon are normal in appearance. No inflammatory changes, wall thickening, or obstructive findings.The appendix is normal. Vascular/Lymphatic: There are no enlarged mesenteric, retroperitoneal, or pelvic lymph nodes. Scattered aortic atherosclerotic calcifications are seen without aneurysmal dilatation. Reproductive: The uterus and adnexa are unremarkable. Other: No evidence of abdominal wall mass or hernia. Musculoskeletal: No acute or significant osseous findings. IMPRESSION: Large multifocal areas of airspace consolidation with air bronchogram seen throughout the right lung, likely due to multifocal pneumonia. Followup PA and lateral chest  X-ray is recommended in 3-4 weeks following trial of antibiotic therapy to ensure resolution and exclude underlying malignancy. No central, segmental, or subsegmental pulmonary embolism. Findings suggestive cirrhosis. Aortic Atherosclerosis (ICD10-I70.0). Electronically Signed   By: Prudencio Pair M.D.   On: 04/19/2020 17:21   DG Chest Port 1 View  Result Date: 04/19/2020 CLINICAL DATA:  Questionable sepsis, evaluate for abnormality. EXAM: PORTABLE CHEST 1 VIEW COMPARISON:  Prior chest radiograph 12/10/2019 and earlier. FINDINGS: Heart size within normal limits. Shallow inspiration radiograph. New as compared to the prior chest radiograph of 12/10/2019, there is a rounded opacity within the right upper lobe. Unchanged calcified nodule within the right mid lung. The left lung is clear. No evidence of pleural effusion or pneumothorax. No acute bony abnormality is identified. Prior left shoulder arthroplasty. These results were called by telephone at the time of interpretation on 04/19/2020 at 2:32 pm to provider MATTHEW TRIFAN , who verbally acknowledged these results. IMPRESSION: Rounded opacity within the right upper lobe, new as compared to the chest radiographs of 12/10/2019. While this could reflect pneumonia, the morphology is suspicious for a mass and CT chest with contrast is recommended for further evaluation. Electronically Signed   By: Kellie Simmering DO   On: 04/19/2020 14:33    Results/Tests Pending at Time of Discharge:  . none  Discharge Medications:  Allergies as of 04/22/2020      Reactions   Gabapentin Other (  See Comments)   Pregabalin    Other reaction(s): Confusion   Aspirin Other (See Comments)   Due to history of hepatitis   Penicillins Hives   Has patient had a PCN reaction causing immediate rash, facial/tongue/throat swelling, SOB or lightheadedness with hypotension: Yes Has patient had a PCN reaction causing severe rash involving mucus membranes or skin necrosis: No Has  patient had a PCN reaction that required hospitalization No Has patient had a PCN reaction occurring within the last 10 years: No If all of the above answers are "NO", then may proceed with Cephalosporin use.      Medication List    TAKE these medications   acetaminophen 500 MG tablet Commonly known as: TYLENOL Take 1,000 mg by mouth every 8 (eight) hours as needed for moderate pain.   albuterol 108 (90 Base) MCG/ACT inhaler Commonly known as: VENTOLIN HFA Inhale 2 puffs into the lungs every 2 (two) hours as needed for wheezing or shortness of breath.   Belbuca 600 MCG Film Generic drug: Buprenorphine HCl Take 1 Film by mouth in the morning and at bedtime.   budesonide 0.5 MG/2ML nebulizer solution Commonly known as: PULMICORT Take 2 mLs (0.5 mg total) by nebulization 2 (two) times daily. What changed:   when to take this  reasons to take this   Calcium Carbonate-Vitamin D 600-400 MG-UNIT tablet Take 1 tablet by mouth daily.   cefdinir 300 MG capsule Commonly known as: OMNICEF Take 1 capsule (300 mg total) by mouth every 12 (twelve) hours for 7 doses.   colchicine 0.6 MG tablet Take 0.6 mg by mouth daily as needed (gout flares).   cyclobenzaprine 5 MG tablet Commonly known as: FLEXERIL Take 5 mg by mouth daily as needed for muscle spasms.   DULoxetine 30 MG capsule Commonly known as: Cymbalta Take 1 capsule (30 mg total) by mouth daily.   losartan-hydrochlorothiazide 100-25 MG tablet Commonly known as: HYZAAR Take 1 tablet by mouth at bedtime.   metFORMIN 500 MG (MOD) 24 hr tablet Commonly known as: GLUMETZA Take 500 mg by mouth daily with breakfast.   multivitamin with minerals Tabs tablet Take 1 tablet by mouth daily.   oxyCODONE-acetaminophen 10-325 MG tablet Commonly known as: PERCOCET Take 1 tablet by mouth every 4 (four) hours as needed for pain.   rosuvastatin 5 MG tablet Commonly known as: CRESTOR Take 5 mg by mouth daily.   sertraline 50 MG  tablet Commonly known as: ZOLOFT Take 25 mg by mouth daily.   Spiriva HandiHaler 18 MCG inhalation capsule Generic drug: tiotropium Place 18 mcg into inhaler and inhale daily as needed (shortness of breath).   tiZANidine 2 MG tablet Commonly known as: ZANAFLEX Take 1 tablet (2 mg total) by mouth every 6 (six) hours as needed for muscle spasms.   VITAMIN B-12 PO Take 1 tablet by mouth daily.     ASK your doctor about these medications   azithromycin 250 MG tablet Commonly known as: ZITHROMAX Take 1 tablet (250 mg total) by mouth every evening for 2 days. Ask about: Should I take this medication?       Discharge Instructions: Please refer to Patient Instructions section of EMR for full details.  Patient was counseled important signs and symptoms that should prompt return to medical care, changes in medications, dietary instructions, activity restrictions, and follow up appointments.   Follow-Up Appointments: Future Appointments  Date Time Provider [place]  05/17/2020 10:30 AM Marzetta Board, DPM [facility]  Wilber Oliphant, MD 04/25/2020, 1:43 PM PGY-2, [place]

## 2020-04-22 NOTE — Discharge Instructions (Signed)
Dear Ashley Riddle,   Thank you for letting us participate in your care! In this section, you will find a brief hospital admission summary of why you were admitted to the hospital, what happened during your admission, your diagnosis/diagnoses, and recommended follow up.   You were admitted because you were experiencing fever fast heart rate and fast breathing.   You were diagnosed with pneumonia.  You were treated with antibiotics.   Your breathing improved and you were discharged from the hospital for meeting this goal.    POST-HOSPITAL & CARE INSTRUCTIONS 1. Please complete your antibiotics courses. Take azithromycin through 11/21 and take cefdenir for 7 more doses through 11/24.  2. Please let PCP/Specialists know of any changes that were made.  3. Please see medications section of this packet for any medication changes.   DOCTOR'S APPOINTMENT & FOLLOW UP CARE INSTRUCTIONS  Future Appointments  Date Time Provider [place]  05/17/2020 10:30 AM Ashley Riddle, DPM [facility]     Thank you for choosing [facility]! Take care and be well!  [facility]  [place], [place] [address] [phone]

## 2020-04-24 LAB — CULTURE, BLOOD (ROUTINE X 2)
Culture: NO GROWTH
Culture: NO GROWTH
Special Requests: ADEQUATE
Special Requests: ADEQUATE

## 2020-04-25 ENCOUNTER — Other Ambulatory Visit: Payer: Self-pay

## 2020-04-25 DIAGNOSIS — Z7689 Persons encountering health services in other specified circumstances: Secondary | ICD-10-CM | POA: Diagnosis not present

## 2020-04-25 DIAGNOSIS — I700 Atherosclerosis of aorta: Secondary | ICD-10-CM | POA: Diagnosis not present

## 2020-04-25 DIAGNOSIS — J690 Pneumonitis due to inhalation of food and vomit: Secondary | ICD-10-CM | POA: Diagnosis not present

## 2020-04-25 DIAGNOSIS — K7460 Unspecified cirrhosis of liver: Secondary | ICD-10-CM | POA: Diagnosis not present

## 2020-04-25 DIAGNOSIS — A419 Sepsis, unspecified organism: Secondary | ICD-10-CM

## 2020-04-25 DIAGNOSIS — R6520 Severe sepsis without septic shock: Secondary | ICD-10-CM

## 2020-04-25 NOTE — Patient Outreach (Signed)
[place] [facility]) Care Management  04/25/2020  Ashley Riddle Apr 04, 1946 [id number]    Red Emmi: Date of emmi call:  04/24/2020 Reason for alert: Scheduled follow up---no                             Other questions-----yes   Placed call to patient who reports that she is doing well. Reports she has her follow up scheduled. Reports no additional questions. Appreciative of call.  PLAN: close case as no needs identified.  Tomasa Rand, RN, BSN, CEN [facility] [employer] [phone]

## 2020-05-01 ENCOUNTER — Other Ambulatory Visit: Payer: Self-pay | Admitting: Internal Medicine

## 2020-05-01 DIAGNOSIS — M25562 Pain in left knee: Secondary | ICD-10-CM | POA: Diagnosis not present

## 2020-05-01 DIAGNOSIS — M25561 Pain in right knee: Secondary | ICD-10-CM | POA: Diagnosis not present

## 2020-05-01 DIAGNOSIS — G894 Chronic pain syndrome: Secondary | ICD-10-CM | POA: Diagnosis not present

## 2020-05-01 DIAGNOSIS — K7460 Unspecified cirrhosis of liver: Secondary | ICD-10-CM

## 2020-05-03 DIAGNOSIS — Z96652 Presence of left artificial knee joint: Secondary | ICD-10-CM | POA: Diagnosis not present

## 2020-05-03 DIAGNOSIS — R262 Difficulty in walking, not elsewhere classified: Secondary | ICD-10-CM | POA: Diagnosis not present

## 2020-05-05 DIAGNOSIS — Z96652 Presence of left artificial knee joint: Secondary | ICD-10-CM | POA: Diagnosis not present

## 2020-05-05 DIAGNOSIS — R262 Difficulty in walking, not elsewhere classified: Secondary | ICD-10-CM | POA: Diagnosis not present

## 2020-05-10 DIAGNOSIS — R262 Difficulty in walking, not elsewhere classified: Secondary | ICD-10-CM | POA: Diagnosis not present

## 2020-05-10 DIAGNOSIS — Z96652 Presence of left artificial knee joint: Secondary | ICD-10-CM | POA: Diagnosis not present

## 2020-05-12 DIAGNOSIS — R262 Difficulty in walking, not elsewhere classified: Secondary | ICD-10-CM | POA: Diagnosis not present

## 2020-05-12 DIAGNOSIS — Z96652 Presence of left artificial knee joint: Secondary | ICD-10-CM | POA: Diagnosis not present

## 2020-05-17 ENCOUNTER — Other Ambulatory Visit: Payer: Self-pay

## 2020-05-17 ENCOUNTER — Ambulatory Visit (INDEPENDENT_AMBULATORY_CARE_PROVIDER_SITE_OTHER): Payer: Medicare Other | Admitting: Podiatry

## 2020-05-17 ENCOUNTER — Encounter: Payer: Self-pay | Admitting: Podiatry

## 2020-05-17 DIAGNOSIS — E119 Type 2 diabetes mellitus without complications: Secondary | ICD-10-CM

## 2020-05-17 DIAGNOSIS — M79674 Pain in right toe(s): Secondary | ICD-10-CM | POA: Diagnosis not present

## 2020-05-17 DIAGNOSIS — Z9889 Other specified postprocedural states: Secondary | ICD-10-CM | POA: Diagnosis not present

## 2020-05-17 DIAGNOSIS — B351 Tinea unguium: Secondary | ICD-10-CM | POA: Diagnosis not present

## 2020-05-17 DIAGNOSIS — Z96652 Presence of left artificial knee joint: Secondary | ICD-10-CM | POA: Diagnosis not present

## 2020-05-17 DIAGNOSIS — M2011 Hallux valgus (acquired), right foot: Secondary | ICD-10-CM | POA: Diagnosis not present

## 2020-05-17 DIAGNOSIS — E1165 Type 2 diabetes mellitus with hyperglycemia: Secondary | ICD-10-CM | POA: Diagnosis not present

## 2020-05-17 DIAGNOSIS — M79675 Pain in left toe(s): Secondary | ICD-10-CM

## 2020-05-17 DIAGNOSIS — M2012 Hallux valgus (acquired), left foot: Secondary | ICD-10-CM

## 2020-05-17 DIAGNOSIS — R262 Difficulty in walking, not elsewhere classified: Secondary | ICD-10-CM | POA: Diagnosis not present

## 2020-05-17 NOTE — Progress Notes (Signed)
Subjective:  Patient ID: Ashley Riddle, female    DOB: 1946-04-19,  MRN: [id number]  74 y.o. female presents with for annual diabetic foot examination and painful thick toenails that are difficult to trim. Pain interferes with ambulation. Aggravating factors include wearing enclosed shoe gear. Pain is relieved with periodic professional debridement.   She has podiatric h/o permanent nail avulsion of the right hallux. She voices no new pedal problems on today's visit.  She states she has been diabetes for one year. Denies any h/o foot wounds. She does have numbness, tingling and burning in her feet.   Patient states her blood glucose was 117 mg/dl today.  PCP is Dr. Latanya Presser and last visit was 03/02/2020.  Review of Systems: Negative except as noted in the HPI.  Past Medical History:  Diagnosis Date  . Anxiety   . Arthritis   . Constipation   . COPD (chronic obstructive pulmonary disease) ([place])   . DDD (degenerative disc disease), cervical   . DDD (degenerative disc disease), lumbar   . Gout   . Heart murmur    probable bicuspid AV with mild AS, mild MR by 04/22/14 Echo (Dr. Montez Morita)  . Hepatitis C    treated 2016   . History of blood transfusion   . Hypertension   . Pneumonia 12/02/13   Past Surgical History:  Procedure Laterality Date  . ABDOMINAL HYSTERECTOMY    . APPENDECTOMY    . BACK SURGERY    . CESAREAN SECTION     x 3  . COLONOSCOPY W/ POLYPECTOMY    . HARDWARE REMOVAL Left 12/26/2015   Procedure: REMOVAL K-WIRE LEFT SCAPULA;  Surgeon: Garald Balding, MD;  Location: [facility];  Service: Orthopedics;  Laterality: Left;  . INNER EAR SURGERY     blood vessel  . TONSILLECTOMY    . TOTAL SHOULDER ARTHROPLASTY Left 12/27/2014   Procedure: TOTAL SHOULDER ARTHROPLASTY;  Surgeon: Garald Balding, MD;  Location: [facility];  Service: Orthopedics;  Laterality: Left;   Patient Active Problem List   Diagnosis Date Noted  . Severe sepsis ([place]) 04/25/2020  . Sepsis  ([place]) 04/19/2020  . Neuropathic pain 03/01/2020  . S/P TKR (total knee replacement) using cement, left 12/14/2019  . Type II diabetes mellitus, uncontrolled ([place]) 11/15/2019  . Medication management 03/23/2019  . Chronic pain of both knees 01/26/2019  . Trochanteric bursitis, right hip 12/15/2018  . Lumbar radiculopathy 11/06/2018  . Acute right-sided low back pain 10/06/2018  . COPD with acute exacerbation ([place]) 04/20/2018  . Anxiety and depression 04/20/2018  . Pain of left shoulder joint on movement 04/13/2017  . Chronic bronchitis ([place]) 03/29/2017  . Gouty arthropathy 03/02/2017  . Hyperammonemia ([place]) 03/02/2017  . Hypomagnesemia 03/02/2017  . CAP (community acquired pneumonia) 02/18/2017  . Liver cirrhosis ([place]) 02/18/2017  . Leukocytosis 02/18/2017  . Sinus tachycardia 02/18/2017  . Tachypnea 02/18/2017  . RLL pneumonia 02/18/2017  . Increased ammonia level 02/18/2017  . Asthma 11/25/2016  . Idiopathic progressive polyneuropathy 11/25/2016  . Peripheral vascular disease ([place]) 11/25/2016  . Obesity 11/25/2016  . Retained metal fragment left scapula 12/26/2015  . Fracture of glenoid process of left scapula 12/29/2014  . Status post total shoulder arthroplasty 12/27/2014  . Cocaine dependence in remission ([place]) 09/29/2014  . Severe heroin dependence in sustained remission ([place]) 09/29/2014  . Opiate abuse, episodic ([place]) 09/29/2014  . Mild tetrahydrocannabinol (THC) abuse 09/29/2014  . Chronic pain syndrome 04/18/2014  . Primary osteoarthritis of left shoulder 04/18/2014  .  Primary osteoarthritis of right knee 04/18/2014  . Primary osteoarthritis of left knee 04/18/2014  . HYPERTENSION, BENIGN ESSENTIAL 03/22/2010  . CONSTIPATION 03/22/2010  . HEPATITIS C 06/04/1995    Current Outpatient Medications:  .  ACCU-CHEK GUIDE test strip, , Disp: , Rfl:  .  acetaminophen (TYLENOL) 500 MG tablet, Take 1,000 mg by mouth every 8 (eight) hours as needed for moderate pain. , Disp:  , Rfl:  .  albuterol (PROVENTIL HFA;VENTOLIN HFA) 108 (90 Base) MCG/ACT inhaler, Inhale 2 puffs into the lungs every 2 (two) hours as needed for wheezing or shortness of breath. , Disp: , Rfl:  .  BELBUCA 600 MCG FILM, Take 1 Film by mouth in the morning and at bedtime. , Disp: , Rfl:  .  budesonide (PULMICORT) 0.5 MG/2ML nebulizer solution, Take 2 mLs (0.5 mg total) by nebulization 2 (two) times daily. (Patient taking differently: Take 0.5 mg by nebulization 2 (two) times daily as needed (wheezing). ), Disp: 120 mL, Rfl: 0 .  Calcium Carbonate-Vitamin D 600-400 MG-UNIT tablet, Take 1 tablet by mouth daily. , Disp: , Rfl:  .  Calcium Carbonate-Vitamin D 600-400 MG-UNIT tablet, Calcium 600 + D(3) 600 mg-10 mcg (400 unit) tablet  Take 1 tablet twice a day by oral route for 30 days., Disp: , Rfl:  .  colchicine 0.6 MG tablet, Take 0.6 mg by mouth daily as needed (gout flares). , Disp: , Rfl:  .  Cyanocobalamin (VITAMIN B-12 PO), Take 1 tablet by mouth daily., Disp: , Rfl:  .  cyclobenzaprine (FLEXERIL) 5 MG tablet, Take 5 mg by mouth daily as needed for muscle spasms. , Disp: , Rfl:  .  DULoxetine (CYMBALTA) 30 MG capsule, Take 1 capsule (30 mg total) by mouth daily. (Patient not taking: Reported on 04/19/2020), Disp: 30 capsule, Rfl: 1 .  losartan-hydrochlorothiazide (HYZAAR) 100-25 MG tablet, Take 1 tablet by mouth at bedtime. , Disp: , Rfl: 3 .  metFORMIN (GLUMETZA) 500 MG (MOD) 24 hr tablet, Take 500 mg by mouth daily with breakfast. , Disp: , Rfl:  .  Multiple Vitamin (MULTIVITAMIN WITH MINERALS) TABS tablet, Take 1 tablet by mouth daily., Disp: , Rfl:  .  oxyCODONE-acetaminophen (PERCOCET) 10-325 MG tablet, Take 1 tablet by mouth every 4 (four) hours as needed for pain., Disp: , Rfl:  .  rosuvastatin (CRESTOR) 5 MG tablet, Take 5 mg by mouth daily., Disp: , Rfl:  .  sertraline (ZOLOFT) 50 MG tablet, Take 25 mg by mouth daily. , Disp: , Rfl:  .  tiotropium (SPIRIVA HANDIHALER) 18 MCG inhalation  capsule, Place 18 mcg into inhaler and inhale daily as needed (shortness of breath). , Disp: , Rfl:  .  tiZANidine (ZANAFLEX) 2 MG tablet, Take 1 tablet (2 mg total) by mouth every 6 (six) hours as needed for muscle spasms., Disp: 15 tablet, Rfl: 0 Allergies  Allergen Reactions  . Gabapentin Other (See Comments)  . Pregabalin     Other reaction(s): Confusion  . Aspirin Other (See Comments)    Due to history of hepatitis  . Penicillins Hives    Has patient had a PCN reaction causing immediate rash, facial/tongue/throat swelling, SOB or lightheadedness with hypotension: Yes Has patient had a PCN reaction causing severe rash involving mucus membranes or skin necrosis: No Has patient had a PCN reaction that required hospitalization No Has patient had a PCN reaction occurring within the last 10 years: No If all of the above answers are "NO", then may proceed with Cephalosporin use.  Social History   Occupational History  . Occupation: retired  Tobacco Use  . Smoking status: Former Smoker    Packs/day: 1.00    Years: 49.00    Pack years: 49.00    Types: Cigarettes    Quit date: 2007    Years since quitting: 14.9  . Smokeless tobacco: Never Used  . Tobacco comment: quit 2007  Vaping Use  . Vaping Use: Never used  Substance and Sexual Activity  . Alcohol use: No  . Drug use: No    Types: Marijuana    Comment: quit marijuana 12/18. last used heroin and cocaine in 1992.  Smokes Marijuana once a week., 12/22/15- "2 weeks ago"  . Sexual activity: Not on file    Objective:   Constitutional Pt is a pleasant 74 y.o. African American female WD, WN in NAD. AAO x 3.   Vascular Capillary fill time to digits <3 seconds b/l lower extremities. Palpable pedal pulses b/l LE. Pedal hair sparse. Lower extremity skin temperature gradient within normal limits. No edema noted b/l lower extremities. No ischemia or gangrene noted b/l lower extremities. No cyanosis or clubbing noted.  Neurologic Normal  speech. Oriented to person, place, and time. Protective sensation intact 5/5 intact bilaterally with 10g monofilament b/l. Proprioception intact bilaterally. Clonus negative b/l.  Dermatologic Pedal skin with normal turgor, texture and tone bilaterally. No open wounds bilaterally. No interdigital macerations bilaterally. Toenails 1-5 left, R 2nd toe, R 3rd toe, R 4th toe and R 5th toe elongated, discolored, dystrophic, thickened, and crumbly with subungual debris and tenderness to dorsal palpation. Anonychia noted R hallux. Nailbed(s) epithelialized.   Orthopedic: Normal muscle strength 5/5 to all lower extremity muscle groups bilaterally. No pain crepitus or joint limitation noted with ROM b/l. Hallux valgus with bunion deformity noted b/l lower extremities.   Radiographs: None Assessment:   1. Pain due to onychomycosis of toenails of both feet   2. Status post nail surgery   3. Hallux valgus, acquired, bilateral   4. Uncontrolled type 2 diabetes mellitus with hyperglycemia ([place])   5. Encounter for diabetic foot exam [facility])    Plan:  Patient was evaluated and treated and all questions answered.  Onychomycosis with pain -Nails palliatively debridement as below. -Educated on self-care  Procedure: Nail Debridement Rationale: Pain Type of Debridement: manual, sharp debridement. Instrumentation: Nail nipper, rotary burr. Number of Nails: 9  -Examined patient. -No new findings. No new orders. -Diabetic foot examination performed on today's visit. -Continue diabetic foot care principles. -Toenails 1-5 left, R 2nd toe, R 3rd toe, R 4th toe and R 5th toe debrided in length and girth without iatrogenic bleeding with sterile nail nipper and dremel.  -Patient to report any pedal injuries to medical professional immediately.  Return in about 3 months (around 08/15/2020) for diabetic foot care.  Ashley Riddle, DPM

## 2020-05-19 DIAGNOSIS — Z96652 Presence of left artificial knee joint: Secondary | ICD-10-CM | POA: Diagnosis not present

## 2020-05-19 DIAGNOSIS — R262 Difficulty in walking, not elsewhere classified: Secondary | ICD-10-CM | POA: Diagnosis not present

## 2020-05-23 DIAGNOSIS — I10 Essential (primary) hypertension: Secondary | ICD-10-CM | POA: Diagnosis not present

## 2020-05-23 DIAGNOSIS — Z23 Encounter for immunization: Secondary | ICD-10-CM | POA: Diagnosis not present

## 2020-05-23 DIAGNOSIS — J690 Pneumonitis due to inhalation of food and vomit: Secondary | ICD-10-CM | POA: Diagnosis not present

## 2020-05-23 DIAGNOSIS — E1165 Type 2 diabetes mellitus with hyperglycemia: Secondary | ICD-10-CM | POA: Diagnosis not present

## 2020-05-23 DIAGNOSIS — Z758 Other problems related to medical facilities and other health care: Secondary | ICD-10-CM | POA: Diagnosis not present

## 2020-05-24 DIAGNOSIS — R262 Difficulty in walking, not elsewhere classified: Secondary | ICD-10-CM | POA: Diagnosis not present

## 2020-05-24 DIAGNOSIS — Z96652 Presence of left artificial knee joint: Secondary | ICD-10-CM | POA: Diagnosis not present

## 2020-05-31 DIAGNOSIS — R262 Difficulty in walking, not elsewhere classified: Secondary | ICD-10-CM | POA: Diagnosis not present

## 2020-05-31 DIAGNOSIS — Z758 Other problems related to medical facilities and other health care: Secondary | ICD-10-CM | POA: Diagnosis not present

## 2020-05-31 DIAGNOSIS — Z96652 Presence of left artificial knee joint: Secondary | ICD-10-CM | POA: Diagnosis not present

## 2020-06-01 ENCOUNTER — Other Ambulatory Visit (HOSPITAL_COMMUNITY): Payer: Self-pay | Admitting: *Deleted

## 2020-06-01 DIAGNOSIS — R1310 Dysphagia, unspecified: Secondary | ICD-10-CM

## 2020-06-05 ENCOUNTER — Ambulatory Visit: Payer: Medicare Other

## 2020-06-07 DIAGNOSIS — Z96652 Presence of left artificial knee joint: Secondary | ICD-10-CM | POA: Diagnosis not present

## 2020-06-07 DIAGNOSIS — R262 Difficulty in walking, not elsewhere classified: Secondary | ICD-10-CM | POA: Diagnosis not present

## 2020-06-08 ENCOUNTER — Ambulatory Visit (HOSPITAL_COMMUNITY)
Admission: RE | Admit: 2020-06-08 | Discharge: 2020-06-08 | Disposition: A | Discharge status: Home / Self Care | Payer: Medicare Other | Source: Ambulatory Visit | Attending: Internal Medicine | Admitting: Internal Medicine

## 2020-06-08 ENCOUNTER — Other Ambulatory Visit: Payer: Self-pay

## 2020-06-08 DIAGNOSIS — R1310 Dysphagia, unspecified: Secondary | ICD-10-CM | POA: Insufficient documentation

## 2020-06-08 DIAGNOSIS — J690 Pneumonitis due to inhalation of food and vomit: Secondary | ICD-10-CM | POA: Diagnosis not present

## 2020-06-08 NOTE — Progress Notes (Signed)
Modified Barium Swallow Progress Note  Patient Details  Name: Ashley Riddle MRN: [id number] Date of Birth: [birth year]  Today's Date: 06/08/2020  Modified Barium Swallow completed.  Full report located under Chart Review in the Imaging Section.  Brief recommendations include the following:  Clinical Impression  Pt demonstrated normal oropharyngeal swallow without laryngeal penetration or aspiration. Timing and strength of swallow onset appropriate with adeqaute laryngeal closure; no residue. Esophagus scanned although MBS does not diagnose below the level of the UES. No overt abnormalities seen. Educated pt [id number] for esophageal health such as remain upright after meals, do not eat late at night and if symptoms arise she could consult GI doctor.   Swallow Evaluation Recommendations       SLP Diet Recommendations: Regular solids;Thin liquid   Liquid Administration via: Cup;Straw   Medication Administration: Whole meds with liquid   Supervision: Patient able to self feed       Postural Changes: Seated upright at 90 degrees   Oral Care Recommendations: Oral care BID        Royce Macadamia 06/08/2020,2:41 PM  Breck Coons [place].Ed Nurse, children's [phone] Office [phone]

## 2020-06-14 DIAGNOSIS — Z96652 Presence of left artificial knee joint: Secondary | ICD-10-CM | POA: Diagnosis not present

## 2020-06-14 DIAGNOSIS — R262 Difficulty in walking, not elsewhere classified: Secondary | ICD-10-CM | POA: Diagnosis not present

## 2020-06-28 DIAGNOSIS — Z96652 Presence of left artificial knee joint: Secondary | ICD-10-CM | POA: Diagnosis not present

## 2020-06-28 DIAGNOSIS — R262 Difficulty in walking, not elsewhere classified: Secondary | ICD-10-CM | POA: Diagnosis not present

## 2020-07-03 DIAGNOSIS — G894 Chronic pain syndrome: Secondary | ICD-10-CM | POA: Diagnosis not present

## 2020-07-03 DIAGNOSIS — M25562 Pain in left knee: Secondary | ICD-10-CM | POA: Diagnosis not present

## 2020-07-03 DIAGNOSIS — M25561 Pain in right knee: Secondary | ICD-10-CM | POA: Diagnosis not present

## 2020-07-03 DIAGNOSIS — Z79899 Other long term (current) drug therapy: Secondary | ICD-10-CM | POA: Diagnosis not present

## 2020-07-03 DIAGNOSIS — M792 Neuralgia and neuritis, unspecified: Secondary | ICD-10-CM | POA: Diagnosis not present

## 2020-07-05 DIAGNOSIS — R262 Difficulty in walking, not elsewhere classified: Secondary | ICD-10-CM | POA: Diagnosis not present

## 2020-07-05 DIAGNOSIS — Z96652 Presence of left artificial knee joint: Secondary | ICD-10-CM | POA: Diagnosis not present

## 2020-07-12 DIAGNOSIS — R262 Difficulty in walking, not elsewhere classified: Secondary | ICD-10-CM | POA: Diagnosis not present

## 2020-07-12 DIAGNOSIS — Z96652 Presence of left artificial knee joint: Secondary | ICD-10-CM | POA: Diagnosis not present

## 2020-07-21 DIAGNOSIS — M25561 Pain in right knee: Secondary | ICD-10-CM | POA: Diagnosis not present

## 2020-07-21 DIAGNOSIS — R262 Difficulty in walking, not elsewhere classified: Secondary | ICD-10-CM | POA: Diagnosis not present

## 2020-07-21 DIAGNOSIS — Z5181 Encounter for therapeutic drug level monitoring: Secondary | ICD-10-CM | POA: Diagnosis not present

## 2020-07-21 DIAGNOSIS — M25562 Pain in left knee: Secondary | ICD-10-CM | POA: Diagnosis not present

## 2020-07-21 DIAGNOSIS — G894 Chronic pain syndrome: Secondary | ICD-10-CM | POA: Diagnosis not present

## 2020-07-21 DIAGNOSIS — Z96652 Presence of left artificial knee joint: Secondary | ICD-10-CM | POA: Diagnosis not present

## 2020-07-21 DIAGNOSIS — G8929 Other chronic pain: Secondary | ICD-10-CM | POA: Diagnosis not present

## 2020-07-21 DIAGNOSIS — Z79899 Other long term (current) drug therapy: Secondary | ICD-10-CM | POA: Diagnosis not present

## 2020-07-21 DIAGNOSIS — M792 Neuralgia and neuritis, unspecified: Secondary | ICD-10-CM | POA: Diagnosis not present

## 2020-07-26 DIAGNOSIS — Z96652 Presence of left artificial knee joint: Secondary | ICD-10-CM | POA: Diagnosis not present

## 2020-07-26 DIAGNOSIS — R262 Difficulty in walking, not elsewhere classified: Secondary | ICD-10-CM | POA: Diagnosis not present

## 2020-08-09 DIAGNOSIS — Z96652 Presence of left artificial knee joint: Secondary | ICD-10-CM | POA: Diagnosis not present

## 2020-08-09 DIAGNOSIS — R262 Difficulty in walking, not elsewhere classified: Secondary | ICD-10-CM | POA: Diagnosis not present

## 2020-08-16 DIAGNOSIS — I10 Essential (primary) hypertension: Secondary | ICD-10-CM | POA: Diagnosis not present

## 2020-08-16 DIAGNOSIS — I739 Peripheral vascular disease, unspecified: Secondary | ICD-10-CM | POA: Diagnosis not present

## 2020-08-16 DIAGNOSIS — M1712 Unilateral primary osteoarthritis, left knee: Secondary | ICD-10-CM | POA: Diagnosis not present

## 2020-08-16 DIAGNOSIS — E1165 Type 2 diabetes mellitus with hyperglycemia: Secondary | ICD-10-CM | POA: Diagnosis not present

## 2020-08-16 DIAGNOSIS — G5603 Carpal tunnel syndrome, bilateral upper limbs: Secondary | ICD-10-CM | POA: Diagnosis not present

## 2020-08-16 DIAGNOSIS — K7460 Unspecified cirrhosis of liver: Secondary | ICD-10-CM | POA: Diagnosis not present

## 2020-08-16 DIAGNOSIS — J410 Simple chronic bronchitis: Secondary | ICD-10-CM | POA: Diagnosis not present

## 2020-08-16 DIAGNOSIS — G603 Idiopathic progressive neuropathy: Secondary | ICD-10-CM | POA: Diagnosis not present

## 2020-08-16 DIAGNOSIS — G8929 Other chronic pain: Secondary | ICD-10-CM | POA: Diagnosis not present

## 2020-08-16 DIAGNOSIS — M19049 Primary osteoarthritis, unspecified hand: Secondary | ICD-10-CM | POA: Diagnosis not present

## 2020-08-23 ENCOUNTER — Encounter: Payer: Self-pay | Admitting: Podiatry

## 2020-08-23 ENCOUNTER — Ambulatory Visit (INDEPENDENT_AMBULATORY_CARE_PROVIDER_SITE_OTHER): Payer: Medicare Other | Admitting: Podiatry

## 2020-08-23 ENCOUNTER — Other Ambulatory Visit: Payer: Self-pay

## 2020-08-23 DIAGNOSIS — B351 Tinea unguium: Secondary | ICD-10-CM | POA: Diagnosis not present

## 2020-08-23 DIAGNOSIS — M79675 Pain in left toe(s): Secondary | ICD-10-CM | POA: Diagnosis not present

## 2020-08-23 DIAGNOSIS — M2012 Hallux valgus (acquired), left foot: Secondary | ICD-10-CM

## 2020-08-23 DIAGNOSIS — M2011 Hallux valgus (acquired), right foot: Secondary | ICD-10-CM

## 2020-08-23 DIAGNOSIS — M79674 Pain in right toe(s): Secondary | ICD-10-CM

## 2020-08-24 ENCOUNTER — Encounter: Payer: Self-pay | Admitting: Occupational Therapy

## 2020-08-24 ENCOUNTER — Ambulatory Visit: Payer: Medicare Other | Attending: Internal Medicine | Admitting: Occupational Therapy

## 2020-08-24 DIAGNOSIS — M25641 Stiffness of right hand, not elsewhere classified: Secondary | ICD-10-CM | POA: Diagnosis not present

## 2020-08-24 DIAGNOSIS — R208 Other disturbances of skin sensation: Secondary | ICD-10-CM | POA: Insufficient documentation

## 2020-08-24 DIAGNOSIS — G5603 Carpal tunnel syndrome, bilateral upper limbs: Secondary | ICD-10-CM | POA: Insufficient documentation

## 2020-08-24 DIAGNOSIS — M79641 Pain in right hand: Secondary | ICD-10-CM

## 2020-08-24 DIAGNOSIS — R278 Other lack of coordination: Secondary | ICD-10-CM | POA: Diagnosis not present

## 2020-08-24 DIAGNOSIS — M25642 Stiffness of left hand, not elsewhere classified: Secondary | ICD-10-CM

## 2020-08-24 DIAGNOSIS — M6281 Muscle weakness (generalized): Secondary | ICD-10-CM | POA: Diagnosis not present

## 2020-08-24 DIAGNOSIS — M79642 Pain in left hand: Secondary | ICD-10-CM | POA: Insufficient documentation

## 2020-08-24 NOTE — Patient Instructions (Signed)
Flexor Tendon Gliding (Active Hook Fist)   With fingers and knuckles straight, bend middle and tip joints. Do not bend large knuckles. Repeat _10-15___ times. Do _4-6___ sessions per day.  MP Flexion (Active)   With back of hand on table, bend large knuckles as far as they will go, keeping small joints straight. Repeat _10-15___ times. Do __4-6__ sessions per day. Activity: Reach into a narrow container.*      Finger Flexion / Extension   With palm up, bend fingers of left hand toward palm, making a  fist. Straighten fingers, opening fist. Repeat sequence _10-15___ times per session. Do _4-6__ sessions per day. Hand Variation: Palm down   Copyright  VHI. All rights reserved.

## 2020-08-24 NOTE — Therapy (Signed)
[facility] [address] [place], [place], [address] Phone: [phone]   Fax:  [phone]  Occupational Therapy Evaluation  Patient Details  Name: Ashley Riddle MRN: [id number] Date of Birth: 04/26/1946 Referring Provider (OT): Latanya Presser MD   Encounter Date: 08/24/2020   OT End of Session - 08/24/20 1747    Visit Number 1    Number of Visits 13    Date for OT Re-Evaluation 10/05/20    Authorization Type UHC Medicare  Medicaid Secondary    Authorization Time Period VL:MN 100% covered    OT Start Time 1747    OT Stop Time 1830    OT Time Calculation (min) 43 min    Activity Tolerance Patient tolerated treatment well    Behavior During Therapy [facility] for tasks assessed/performed           Past Medical History:  Diagnosis Date  . Anxiety   . Arthritis   . Constipation   . COPD (chronic obstructive pulmonary disease) ([place])   . DDD (degenerative disc disease), cervical   . DDD (degenerative disc disease), lumbar   . Gout   . Heart murmur    probable bicuspid AV with mild AS, mild MR by 04/22/14 Echo (Dr. Montez Morita)  . Hepatitis C    treated 2016   . History of blood transfusion   . Hypertension   . Pneumonia 12/02/13    Past Surgical History:  Procedure Laterality Date  . ABDOMINAL HYSTERECTOMY    . APPENDECTOMY    . BACK SURGERY    . CESAREAN SECTION     x 3  . COLONOSCOPY W/ POLYPECTOMY    . HARDWARE REMOVAL Left 12/26/2015   Procedure: REMOVAL K-WIRE LEFT SCAPULA;  Surgeon: Garald Balding, MD;  Location: [facility];  Service: Orthopedics;  Laterality: Left;  . INNER EAR SURGERY     blood vessel  . TONSILLECTOMY    . TOTAL SHOULDER ARTHROPLASTY Left 12/27/2014   Procedure: TOTAL SHOULDER ARTHROPLASTY;  Surgeon: Garald Balding, MD;  Location: [facility];  Service: Orthopedics;  Laterality: Left;    There were no vitals filed for this visit.   Subjective Assessment - 08/24/20 1746     Subjective  Pt is a 75 year old that presents with bilateral carpal tunnel syndrome. Pt reports most discomfort and numbness in the PM and during sleep. Pt reports it wakes her up in the middle of the night. Pt does not have any braces or splints currently. 8/10 pain with numbness at night.    Pertinent History PMH: Pt presents with carpal tunnel in BUE, DM2, cirrhosis of liver, HTN, OA of knee, asthma    Patient Stated Goals see if I can get it to work better and get numbness out of my hand.    Currently in Pain? Yes    Pain Score 8     Pain Location Hand    Pain Orientation Right;Left    Pain Descriptors / Indicators Numbness    Pain Type Neuropathic pain    Pain Onset More than a month ago    Pain Frequency Intermittent    Aggravating Factors  sleeping position    Pain Relieving Factors warm water             [facility] OT Assessment - 08/24/20 1752      Assessment   Medical Diagnosis bilateral carpal tunnel syndrome    Referring Provider (OT) Latanya Presser MD    Onset Date/Surgical  Date 07/19/20   "about a month ago"   Hand Dominance Right    Prior Therapy previous HHPT for knee sx      Precautions   Precaution Comments does not have any braces - doc gave RX for gloves but has not received      Balance Screen   Has the patient fallen in the past 6 months Yes   had knee sx in [place] expects to be discharged to: Private residence    Living Arrangements Other relatives    Type of [place] to live on main level with bedroom/bathroom    Bathroom Shower/Tub Tub/Shower unit      Prior Function   Level of Independence Independent    Vocation Retired    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 Pt reports difficulty with toilet hygiene and clasping bra hook d/t hands      IADL   Prior Level of Function Shopping granddaughter and family do since knee sx    Light Housekeeping Does personal laundry completely;Performs light daily tasks such as dishwashing, bed making    Meal Prep Plans, prepares and serves adequate meals independently    [place] own vehicle    Medication Management Is responsible for taking medication in correct dosages at correct time    Physiological scientist financial matters independently (budgets, writes checks, pays rent, bills goes to bank), collects and keeps track of income      Written Expression   Dominant Hand Right    Handwriting 90% legible      Observation/Other Assessments   Focus on Therapeutic Outcomes (FOTO)  N/A      Sensation   Additional Comments can feel light touch and hot/cold but tingly      Coordination   9 Hole Peg Test Right;Left    Right 9 Hole Peg Test 31.63s    Left 9 Hole Peg Test 33.47s      Hand Function   Right Hand Gross Grasp Functional   approx 80-90% flexion   Right Hand Grip (lbs) 38.3    Left Hand Gross Grasp Functional   approx 80-90% flexion   Left Hand Grip (lbs) 33.7             Fluidotherapy x 10 minutes for BUE wrists for pain relief and edema. No adverse reactions  Tendon Glides HEP - see pt instructions.              OT Education - 08/24/20 1813    Education Details Tendon Glides    Person(s) Educated Patient    Methods Explanation;Demonstration;Handout     Comprehension Verbalized understanding;Returned demonstration            OT Short  Term Goals - 08/24/20 1817      OT SHORT TERM GOAL #1   Title Pt will be independent with HEP 414/2022    Time 3    Period Weeks    Status New    Target Date 09/14/20      OT SHORT TERM GOAL #2   Title Pt will be compliant and verbalize understanding of splint and/or brace wear and care instructions PRN    Time 3    Period Weeks    Status New      OT SHORT TERM GOAL #3   Title Pt will verbalize understanding of adapted strategies and/or activity modifications for increasing independence with ADLs and decreasing pain.   (cutting up food, zippers, hooking bra, etc)    Time 3    Period Weeks    Status New      OT SHORT TERM GOAL #4   Title Pt will verbalize understanding of pain management strategies.    Time 3    Period Weeks    Status New             OT Long Term Goals - 08/24/20 1818      OT LONG TERM GOAL #1   Title Pt will be independent with any updated HEPs 10/05/20    Time 6    Period Weeks    Status New    Target Date 10/05/20      OT LONG TERM GOAL #2   Title Pt will increase grip strength in BUE to 40 lbs or greater in order to increase functional use of BUE.    Baseline RUE 38.3, LUE 33.7    Time 6    Period Weeks    Status New      OT LONG TERM GOAL #3   Title Pt will increase fine motor coordination by decreasing 9 hole peg test time by 3 seconds with BUE.    Baseline RUE 31.63s LUE 33.47s    Time 6    Period Weeks    Status New      OT LONG TERM GOAL #4   Title Pt will verbalize understanding of sleep positions for decreasing pain/numbness in BUE    Time 6    Period Weeks    Status New      OT LONG TERM GOAL #5   Title Pt will demonstrate composite flexion of 100% in BUE.    Baseline 80-90%    Time 6    Period Weeks    Status New                 Plan - 08/24/20 1746    Clinical Impression Statement Pt is a 75 year old that presents to Neuro  OPOT with bilateral carpal tunnel. PMH significant for DM2, cirrhosis of liver, HTN, OA of knee and asthma. Pt currently does not have any braces or splints for her BUE. Pt reports significant numbness and discomfort at night/during sleep. Pt presents with deficits with stiffness, weakness, sensory deficits and lack of coorindation. Skilled occupational therapy is recommended to target listed areas of deficit with education, modifications, adaptations and building strength and coordination for increasing comfort and independence.    OT Occupational Profile and History Problem Focused Assessment - Including review of records relating to presenting problem    Occupational performance deficits (Please refer to evaluation for details): ADL's;IADL's    Body Structure / Function / Physical Skills ADL;Body mechanics;Coordination;IADL;Pain;Sensation;GMC;ROM;UE functional use;Flexibility;Decreased knowledge of use of  DME;FMC;Dexterity;Strength    Rehab Potential Good    Clinical Decision Making Limited treatment options, no task modification necessary    Comorbidities Affecting Occupational Performance: None    Modification or Assistance to Complete Evaluation  No modification of tasks or assist necessary to complete eval    OT Frequency 2x / week    OT Duration 6 weeks   12 visits   OT Treatment/Interventions Self-care/ADL training;Cryotherapy;Moist Heat;Ultrasound;Fluidtherapy;Paraffin;Therapeutic exercise;DME and/or AE instruction;Passive range of motion;Therapeutic activities;Patient/family education;Splinting    Plan Fluido, AROM wrist exercises HEP    OT Home Exercise Plan tendon glides    Consulted and Agree with Plan of Care Patient           Patient will benefit from skilled therapeutic intervention in order to improve the following deficits and impairments:   Body Structure / Function / Physical Skills: ADL,Body mechanics,Coordination,IADL,Pain,Sensation,GMC,ROM,UE functional  use,Flexibility,Decreased knowledge of use of DME,FMC,Dexterity,Strength       Visit Diagnosis: Carpal tunnel syndrome, bilateral upper limbs - Plan: Ot plan of care cert/re-cert  Other disturbances of skin sensation - Plan: Ot plan of care cert/re-cert  Other lack of coordination - Plan: Ot plan of care cert/re-cert  Muscle weakness (generalized) - Plan: Ot plan of care cert/re-cert  Pain in left hand - Plan: Ot plan of care cert/re-cert  Pain in right hand - Plan: Ot plan of care cert/re-cert  Stiffness of left hand, not elsewhere classified - Plan: Ot plan of care cert/re-cert  Stiffness of right hand, not elsewhere classified - Plan: Ot plan of care cert/re-cert    Problem List Patient Active Problem List   Diagnosis Date Noted  . Severe sepsis ([place]) 04/25/2020  . Sepsis ([place]) 04/19/2020  . Neuropathic pain 03/01/2020  . S/P TKR (total knee replacement) using cement, left 12/14/2019  . Type II diabetes mellitus, uncontrolled ([place]) 11/15/2019  . Medication management 03/23/2019  . Chronic pain of both knees 01/26/2019  . Trochanteric bursitis, right hip 12/15/2018  . Lumbar radiculopathy 11/06/2018  . Acute right-sided low back pain 10/06/2018  . COPD with acute exacerbation ([place]) 04/20/2018  . Anxiety and depression 04/20/2018  . Pain of left shoulder joint on movement 04/13/2017  . Chronic bronchitis ([place]) 03/29/2017  . Gouty arthropathy 03/02/2017  . Hyperammonemia ([place]) 03/02/2017  . Hypomagnesemia 03/02/2017  . CAP (community acquired pneumonia) 02/18/2017  . Liver cirrhosis ([place]) 02/18/2017  . Leukocytosis 02/18/2017  . Sinus tachycardia 02/18/2017  . Tachypnea 02/18/2017  . RLL pneumonia 02/18/2017  . Increased ammonia level 02/18/2017  . Asthma 11/25/2016  . Idiopathic progressive polyneuropathy 11/25/2016  . Peripheral vascular disease ([place]) 11/25/2016  . Obesity 11/25/2016  . Retained metal fragment left scapula 12/26/2015  . Fracture of glenoid  process of left scapula 12/29/2014  . Status post total shoulder arthroplasty 12/27/2014  . Cocaine dependence in remission ([place]) 09/29/2014  . Severe heroin dependence in sustained remission ([place]) 09/29/2014  . Opiate abuse, episodic ([place]) 09/29/2014  . Mild tetrahydrocannabinol (THC) abuse 09/29/2014  . Chronic pain syndrome 04/18/2014  . Primary osteoarthritis of left shoulder 04/18/2014  . Primary osteoarthritis of right knee 04/18/2014  . Primary osteoarthritis of left knee 04/18/2014  . HYPERTENSION, BENIGN ESSENTIAL 03/22/2010  . CONSTIPATION 03/22/2010  . HEPATITIS C 06/04/1995    Zachery Conch MOT, OTR/L  08/25/2020, 8:02 AM  [facility] [address] [facility] [place], [place], [address] Phone: [phone]   Fax:  [phone]  Name: Delrae Hagey MRN: [id number] Date of  Birth: 02/04/1946

## 2020-08-27 NOTE — Progress Notes (Signed)
  Subjective:  Patient ID: Ashley Riddle, female    DOB: [birth year],  MRN: [id number]  75 y.o. female presents with preventative diabetic foot care and painful thick toenails that are difficult to trim. Pain interferes with ambulation. Aggravating factors include wearing enclosed shoe gear. Pain is relieved with periodic professional debridement.   She has podiatric h/o permanent nail avulsion of the right hallux. She voices no new pedal problems on today's visit.  PCP is Dr. Latanya Presser and last visit was 03/02/2020.  Review of Systems: Negative except as noted in the HPI.   Allergies  Allergen Reactions  . Gabapentin Other (See Comments)  . Pregabalin     Other reaction(s): Confusion  . Aspirin Other (See Comments)    Due to history of hepatitis  . Penicillins Hives    Has patient had a PCN reaction causing immediate rash, facial/tongue/throat swelling, SOB or lightheadedness with hypotension: Yes Has patient had a PCN reaction causing severe rash involving mucus membranes or skin necrosis: No Has patient had a PCN reaction that required hospitalization No Has patient had a PCN reaction occurring within the last 10 years: No If all of the above answers are "NO", then may proceed with Cephalosporin use.    Objective:   Constitutional Pt is a pleasant 75 y.o. African American female WD, WN in NAD. AAO x 3.   Vascular Capillary fill time to digits <3 seconds b/l lower extremities. Palpable pedal pulses b/l LE. Pedal hair sparse. Lower extremity skin temperature gradient within normal limits. No edema noted b/l lower extremities. No ischemia or gangrene noted b/l lower extremities. No cyanosis or clubbing noted.  Neurologic Normal speech. Oriented to person, place, and time. Protective sensation intact 5/5 intact bilaterally with 10g monofilament b/l. Proprioception intact bilaterally. Clonus negative b/l.  Dermatologic Pedal skin with normal turgor, texture and tone  bilaterally. No open wounds bilaterally. No interdigital macerations bilaterally. Toenails 1-5 left, R 2nd toe, R 3rd toe, R 4th toe and R 5th toe elongated, discolored, dystrophic, thickened, and crumbly with subungual debris and tenderness to dorsal palpation. Anonychia noted R hallux. Nailbed(s) epithelialized.   Orthopedic: Normal muscle strength 5/5 to all lower extremity muscle groups bilaterally. No pain crepitus or joint limitation noted with ROM b/l. Hallux valgus with bunion deformity noted b/l lower extremities.   Radiographs: None Assessment:   1. Pain due to onychomycosis of toenails of both feet   2. Hallux valgus, acquired, bilateral    Plan:  Patient was evaluated and treated and all questions answered.  Onychomycosis with pain -Nails palliatively debridement as below. -Educated on self-care  Procedure: Nail Debridement Rationale: Pain Type of Debridement: manual, sharp debridement. Instrumentation: Nail nipper, rotary burr. Number of Nails: 9  -Examined patient. -No new findings. No new orders. -Continue diabetic foot care principles. -Toenails 2-5 bilaterally and L hallux debrided in length and girth without iatrogenic bleeding with sterile nail nipper and dremel.  -Patient to report any pedal injuries to medical professional immediately. -Patient/POA to call should there be question/concern in the interim..  Return in about 3 months (around 11/23/2020) for nail trim.  Ashley Riddle, DPM

## 2020-08-29 ENCOUNTER — Ambulatory Visit: Payer: Medicare Other | Admitting: Occupational Therapy

## 2020-08-29 ENCOUNTER — Encounter: Payer: Self-pay | Admitting: Occupational Therapy

## 2020-08-29 ENCOUNTER — Other Ambulatory Visit: Payer: Self-pay

## 2020-08-29 DIAGNOSIS — R278 Other lack of coordination: Secondary | ICD-10-CM

## 2020-08-29 DIAGNOSIS — R208 Other disturbances of skin sensation: Secondary | ICD-10-CM

## 2020-08-29 DIAGNOSIS — M79641 Pain in right hand: Secondary | ICD-10-CM | POA: Diagnosis not present

## 2020-08-29 DIAGNOSIS — M25641 Stiffness of right hand, not elsewhere classified: Secondary | ICD-10-CM | POA: Diagnosis not present

## 2020-08-29 DIAGNOSIS — M25642 Stiffness of left hand, not elsewhere classified: Secondary | ICD-10-CM | POA: Diagnosis not present

## 2020-08-29 DIAGNOSIS — M79642 Pain in left hand: Secondary | ICD-10-CM | POA: Diagnosis not present

## 2020-08-29 DIAGNOSIS — G5603 Carpal tunnel syndrome, bilateral upper limbs: Secondary | ICD-10-CM | POA: Diagnosis not present

## 2020-08-29 DIAGNOSIS — M6281 Muscle weakness (generalized): Secondary | ICD-10-CM

## 2020-08-29 NOTE — Therapy (Signed)
[facility] [address] [place], [place], [address] Phone: [phone]   Fax:  [phone]  Occupational Therapy Treatment  Patient Details  Name: Ashley Riddle MRN: [id number] Date of Birth: 1946-05-13 Referring Provider (OT): Latanya Presser MD   Encounter Date: 08/29/2020   OT End of Session - 08/29/20 1749    Visit Number 2    Number of Visits 13    Date for OT Re-Evaluation 10/05/20    Authorization Type UHC Medicare  Medicaid Secondary    Authorization Time Period VL:MN 100% covered    OT Start Time 1747    OT Stop Time 1830    OT Time Calculation (min) 43 min    Activity Tolerance Patient tolerated treatment well    Behavior During Therapy [facility] for tasks assessed/performed           Past Medical History:  Diagnosis Date  . Anxiety   . Arthritis   . Constipation   . COPD (chronic obstructive pulmonary disease) ([place])   . DDD (degenerative disc disease), cervical   . DDD (degenerative disc disease), lumbar   . Gout   . Heart murmur    probable bicuspid AV with mild AS, mild MR by 04/22/14 Echo (Dr. Montez Morita)  . Hepatitis C    treated 2016   . History of blood transfusion   . Hypertension   . Pneumonia 12/02/13    Past Surgical History:  Procedure Laterality Date  . ABDOMINAL HYSTERECTOMY    . APPENDECTOMY    . BACK SURGERY    . CESAREAN SECTION     x 3  . COLONOSCOPY W/ POLYPECTOMY    . HARDWARE REMOVAL Left 12/26/2015   Procedure: REMOVAL K-WIRE LEFT SCAPULA;  Surgeon: Garald Balding, MD;  Location: [facility];  Service: Orthopedics;  Laterality: Left;  . INNER EAR SURGERY     blood vessel  . TONSILLECTOMY    . TOTAL SHOULDER ARTHROPLASTY Left 12/27/2014   Procedure: TOTAL SHOULDER ARTHROPLASTY;  Surgeon: Garald Balding, MD;  Location: [facility];  Service: Orthopedics;  Laterality: Left;    There were no vitals filed for this visit.   Subjective Assessment - 08/29/20 1749     Subjective  "doing ok - i can hardly walk today"    Pertinent History PMH: Pt presents with carpal tunnel in BUE, DM2, cirrhosis of liver, HTN, OA of knee, asthma    Patient Stated Goals see if I can get it to work better and get numbness out of my hand.    Currently in Pain? Yes    Pain Score 8     Pain Location Hand    Pain Orientation Right;Left    Pain Descriptors / Indicators Numbness;Aching    Pain Type Neuropathic pain;Acute pain    Pain Onset More than a month ago    Pain Frequency Constant    Aggravating Factors  in the morning             TREATMENT:  Fluidotherapy: 12 minutes BUE for pain relief. No adverse reactions. AROM of wrists and hands in fluidotherapy.    Issued wrist brace for night time wear with RUE wrist - will issued L if R is successful.     AROM wrist exercises: BUE wrist extension/flexion, supination/pronation, UD/RD   Supination/Pronation wheel x 10 BUE   Flipping Cards: BUE for coordination and wrist mobility   Grooved Pegs: RUE with mod difficulty and increased time  OT Short Term Goals - 08/24/20 1817      OT SHORT TERM GOAL #1   Title Pt will be independent with HEP 414/2022    Time 3    Period Weeks    Status New    Target Date 09/14/20      OT SHORT TERM GOAL #2   Title Pt will be compliant and verbalize understanding of splint and/or brace wear and care instructions PRN    Time 3    Period Weeks    Status New      OT SHORT TERM GOAL #3   Title Pt will verbalize understanding of adapted strategies and/or activity modifications for increasing independence with ADLs and decreasing pain.   (cutting up food, zippers, hooking bra, etc)    Time 3    Period Weeks    Status New      OT SHORT TERM GOAL #4   Title Pt will verbalize understanding of pain management strategies.    Time 3    Period Weeks    Status New             OT Long Term Goals - 08/24/20 1818      OT LONG TERM GOAL #1   Title Pt will be  independent with any updated HEPs 10/05/20    Time 6    Period Weeks    Status New    Target Date 10/05/20      OT LONG TERM GOAL #2   Title Pt will increase grip strength in BUE to 40 lbs or greater in order to increase functional use of BUE.    Baseline RUE 38.3, LUE 33.7    Time 6    Period Weeks    Status New      OT LONG TERM GOAL #3   Title Pt will increase fine motor coordination by decreasing 9 hole peg test time by 3 seconds with BUE.    Baseline RUE 31.63s LUE 33.47s    Time 6    Period Weeks    Status New      OT LONG TERM GOAL #4   Title Pt will verbalize understanding of sleep positions for decreasing pain/numbness in BUE    Time 6    Period Weeks    Status New      OT LONG TERM GOAL #5   Title Pt will demonstrate composite flexion of 100% in BUE.    Baseline 80-90%    Time 6    Period Weeks    Status New                 Plan - 08/29/20 1751    Clinical Impression Statement Pt returns post evaluation. Pt progressing towards goals. Pt continues to present with significant pain in BUE.    OT Occupational Profile and History Problem Focused Assessment - Including review of records relating to presenting problem    Occupational performance deficits (Please refer to evaluation for details): ADL's;IADL's    Body Structure / Function / Physical Skills ADL;Body mechanics;Coordination;IADL;Pain;Sensation;GMC;ROM;UE functional use;Flexibility;Decreased knowledge of use of DME;FMC;Dexterity;Strength    Rehab Potential Good    Clinical Decision Making Limited treatment options, no task modification necessary    Comorbidities Affecting Occupational Performance: None    Modification or Assistance to Complete Evaluation  No modification of tasks or assist necessary to complete eval    OT Frequency 2x / week    OT Duration 6 weeks   12 visits  OT Treatment/Interventions Self-care/ADL training;Cryotherapy;Moist Heat;Ultrasound;Fluidtherapy;Paraffin;Therapeutic  exercise;DME and/or AE instruction;Passive range of motion;Therapeutic activities;Patient/family education;Splinting    Plan Fluido, AROM wrist exercises HEP    OT Home Exercise Plan tendon glides    Consulted and Agree with Plan of Care Patient           Patient will benefit from skilled therapeutic intervention in order to improve the following deficits and impairments:   Body Structure / Function / Physical Skills: ADL,Body mechanics,Coordination,IADL,Pain,Sensation,GMC,ROM,UE functional use,Flexibility,Decreased knowledge of use of DME,FMC,Dexterity,Strength       Visit Diagnosis: Stiffness of left hand, not elsewhere classified  Carpal tunnel syndrome, bilateral upper limbs  Other disturbances of skin sensation  Other lack of coordination  Muscle weakness (generalized)  Pain in left hand  Pain in right hand  Stiffness of right hand, not elsewhere classified    Problem List Patient Active Problem List   Diagnosis Date Noted  . Severe sepsis ([place]) 04/25/2020  . Sepsis ([place]) 04/19/2020  . Neuropathic pain 03/01/2020  . S/P TKR (total knee replacement) using cement, left 12/14/2019  . Type II diabetes mellitus, uncontrolled ([place]) 11/15/2019  . Medication management 03/23/2019  . Chronic pain of both knees 01/26/2019  . Trochanteric bursitis, right hip 12/15/2018  . Lumbar radiculopathy 11/06/2018  . Acute right-sided low back pain 10/06/2018  . COPD with acute exacerbation ([place]) 04/20/2018  . Anxiety and depression 04/20/2018  . Pain of left shoulder joint on movement 04/13/2017  . Chronic bronchitis ([place]) 03/29/2017  . Gouty arthropathy 03/02/2017  . Hyperammonemia ([place]) 03/02/2017  . Hypomagnesemia 03/02/2017  . CAP (community acquired pneumonia) 02/18/2017  . Liver cirrhosis ([place]) 02/18/2017  . Leukocytosis 02/18/2017  . Sinus tachycardia 02/18/2017  . Tachypnea 02/18/2017  . RLL pneumonia 02/18/2017  . Increased ammonia level 02/18/2017  . Asthma  11/25/2016  . Idiopathic progressive polyneuropathy 11/25/2016  . Peripheral vascular disease ([place]) 11/25/2016  . Obesity 11/25/2016  . Retained metal fragment left scapula 12/26/2015  . Fracture of glenoid process of left scapula 12/29/2014  . Status post total shoulder arthroplasty 12/27/2014  . Cocaine dependence in remission ([place]) 09/29/2014  . Severe heroin dependence in sustained remission ([place]) 09/29/2014  . Opiate abuse, episodic ([place]) 09/29/2014  . Mild tetrahydrocannabinol (THC) abuse 09/29/2014  . Chronic pain syndrome 04/18/2014  . Primary osteoarthritis of left shoulder 04/18/2014  . Primary osteoarthritis of right knee 04/18/2014  . Primary osteoarthritis of left knee 04/18/2014  . HYPERTENSION, BENIGN ESSENTIAL 03/22/2010  . CONSTIPATION 03/22/2010  . HEPATITIS C 06/04/1995    Zachery Conch MOT, OTR/L  08/29/2020, 5:52 PM  [facility] [address] [place], [place], [address] Phone: [phone]   Fax:  [phone]  Name: Ashley Riddle MRN: [id number] Date of Birth: 03-11-1946

## 2020-08-29 NOTE — Patient Instructions (Signed)
AROM: Wrist Extension   .  With ____ palm down, bend wrist up. Repeat __15__ times per set.  Do __4-6__ sessions per day.    AROM: Wrist Flexion   With_____ palm up, bend wrist up. Repeat __15__ times per set.  Do _4-6___ sessions per day.   AROM: Forearm Pronation / Supination   With ____ arm in handshake position, slowly rotate palm down until stretch is felt. Relax. Then rotate palm up until stretch is felt. Repeat _15___ times per set. Do _4-6___ sessions per day.  Copyright  VHI. All rights reserved.      

## 2020-08-31 ENCOUNTER — Other Ambulatory Visit: Payer: Self-pay

## 2020-08-31 ENCOUNTER — Ambulatory Visit: Payer: Medicare Other | Admitting: Occupational Therapy

## 2020-08-31 DIAGNOSIS — G5603 Carpal tunnel syndrome, bilateral upper limbs: Secondary | ICD-10-CM

## 2020-08-31 DIAGNOSIS — R208 Other disturbances of skin sensation: Secondary | ICD-10-CM

## 2020-08-31 DIAGNOSIS — M792 Neuralgia and neuritis, unspecified: Secondary | ICD-10-CM | POA: Diagnosis not present

## 2020-08-31 DIAGNOSIS — M25642 Stiffness of left hand, not elsewhere classified: Secondary | ICD-10-CM | POA: Diagnosis not present

## 2020-08-31 DIAGNOSIS — M79641 Pain in right hand: Secondary | ICD-10-CM | POA: Diagnosis not present

## 2020-08-31 DIAGNOSIS — M6281 Muscle weakness (generalized): Secondary | ICD-10-CM

## 2020-08-31 DIAGNOSIS — M25561 Pain in right knee: Secondary | ICD-10-CM | POA: Diagnosis not present

## 2020-08-31 DIAGNOSIS — G894 Chronic pain syndrome: Secondary | ICD-10-CM | POA: Diagnosis not present

## 2020-08-31 DIAGNOSIS — R278 Other lack of coordination: Secondary | ICD-10-CM | POA: Diagnosis not present

## 2020-08-31 DIAGNOSIS — M25562 Pain in left knee: Secondary | ICD-10-CM | POA: Diagnosis not present

## 2020-08-31 DIAGNOSIS — M25641 Stiffness of right hand, not elsewhere classified: Secondary | ICD-10-CM

## 2020-08-31 DIAGNOSIS — M79642 Pain in left hand: Secondary | ICD-10-CM | POA: Diagnosis not present

## 2020-08-31 NOTE — Patient Instructions (Signed)
Joint Protection (Grip)    Avoid: grasping thin utensils for prolonged periods. Solution: Hold thick-handled tools in dagger fashion when-ever possible for performing tasks such as stirring or scrub-bing. Relax fingers every 10 minutes during activity.  Joint Protection (Lifting)    Avoid picking up heavy items with one hand. Solution: Use both hands, and slide item whenever possible.  Joint Protection (Use Large Joints)    Avoid placing pressure on fingertips. Solution: Transfer work to other parts of body which are not affected or which have greater strength. Using body weight to push heavy doors open is an example.  Joint Protection (Carrying)    Avoid carrying items with weight on fingers. Solution: Use a shoulder bag or a back pack.  Joint Protection (Ulnar Deviation)    Avoid positions that cause fingers to lean sideways toward little finger. Solution: Use devices like jar-openers to assist in activities.  Copyright  VHI. All rights reserved.

## 2020-08-31 NOTE — Therapy (Signed)
[facility] [address] [place], [place], [address] Phone: [phone]   Fax:  [phone]  Occupational Therapy Treatment  Patient Details  Name: Ashley Riddle MRN: [id number] Date of Birth: [birth year] Referring Provider (OT): Latanya Presser MD   Encounter Date: 08/31/2020   OT End of Session - 08/31/20 1750    Visit Number 3    Number of Visits 13    Date for OT Re-Evaluation 10/05/20    Authorization Type UHC Medicare  Medicaid Secondary    Authorization Time Period VL:MN 100% covered    OT Start Time 1749    OT Stop Time 1827    OT Time Calculation (min) 38 min    Activity Tolerance Patient tolerated treatment well    Behavior During Therapy [facility] for tasks assessed/performed           Past Medical History:  Diagnosis Date  . Anxiety   . Arthritis   . Constipation   . COPD (chronic obstructive pulmonary disease) ([place])   . DDD (degenerative disc disease), cervical   . DDD (degenerative disc disease), lumbar   . Gout   . Heart murmur    probable bicuspid AV with mild AS, mild MR by 04/22/14 Echo (Dr. Montez Morita)  . Hepatitis C    treated 2016   . History of blood transfusion   . Hypertension   . Pneumonia 12/02/13    Past Surgical History:  Procedure Laterality Date  . ABDOMINAL HYSTERECTOMY    . APPENDECTOMY    . BACK SURGERY    . CESAREAN SECTION     x 3  . COLONOSCOPY W/ POLYPECTOMY    . HARDWARE REMOVAL Left 12/26/2015   Procedure: REMOVAL K-WIRE LEFT SCAPULA;  Surgeon: Garald Balding, MD;  Location: [facility];  Service: Orthopedics;  Laterality: Left;  . INNER EAR SURGERY     blood vessel  . TONSILLECTOMY    . TOTAL SHOULDER ARTHROPLASTY Left 12/27/2014   Procedure: TOTAL SHOULDER ARTHROPLASTY;  Surgeon: Garald Balding, MD;  Location: [facility];  Service: Orthopedics;  Laterality: Left;    There were no vitals filed for this visit.   Subjective Assessment - 08/31/20 1751     Subjective  "still there but not as bad - i still feel the numbness in my fingers"    Pertinent History PMH: Pt presents with carpal tunnel in BUE, DM2, cirrhosis of liver, HTN, OA of knee, asthma    Patient Stated Goals see if I can get it to work better and get numbness out of my hand.    Currently in Pain? Yes    Pain Score 6     Pain Location Hand    Pain Orientation Right;Left    Pain Descriptors / Indicators Aching;Numbness    Pain Type Acute pain;Neuropathic pain    Pain Onset More than a month ago    Pain Frequency Constant            TREATMENT:  Fluidotherapy: x 10 minutes with BUE wrist and hands for pain relief. No adverse reactions.  Pt reports good success with R brace. Issuing same brace for L wrist for pain relief and immobilization.  AROM wrist with BUE wrist flexion/extension, supination/pronation, UD/RD x 10  Supination/Pronation Wheel x 10 each side in the pain free zone.   Forearm Gym with BUE x 5 times                   OT  Short Term Goals - 08/29/20 1809      OT SHORT TERM GOAL #1   Title Pt will be independent with HEP 414/2022    Time 3    Period Weeks    Status On-going    Target Date 09/14/20      OT SHORT TERM GOAL #2   Title Pt will be compliant and verbalize understanding of splint and/or brace wear and care instructions PRN    Time 3    Period Weeks    Status On-going   issued black wrist brace RUE 08/29/20     OT SHORT TERM GOAL #3   Title Pt will verbalize understanding of adapted strategies and/or activity modifications for increasing independence with ADLs and decreasing pain.   (cutting up food, zippers, hooking bra, etc)    Time 3    Period Weeks    Status New      OT SHORT TERM GOAL #4   Title Pt will verbalize understanding of pain management strategies.    Time 3    Period Weeks    Status New             OT Long Term Goals - 08/29/20 1821      OT LONG TERM GOAL #1   Title Pt will be independent with  any updated HEPs 10/05/20    Time 6    Period Weeks    Status New      OT LONG TERM GOAL #2   Title Pt will increase grip strength in BUE to 40 lbs or greater in order to increase functional use of BUE.    Baseline RUE 38.3, LUE 33.7    Time 6    Period Weeks    Status New      OT LONG TERM GOAL #3   Title Pt will increase fine motor coordination by decreasing 9 hole peg test time by 3 seconds with BUE.    Baseline RUE 31.63s LUE 33.47s    Time 6    Period Weeks    Status New      OT LONG TERM GOAL #4   Title Pt will verbalize understanding of sleep positions for decreasing pain/numbness in BUE    Time 6    Period Weeks    Status On-going      OT LONG TERM GOAL #5   Title Pt will demonstrate composite flexion of 100% in BUE.    Baseline 80-90%    Time 6    Period Weeks    Status New                 Plan - 08/31/20 1805    Clinical Impression Statement Pt reports less pain and discomfort this day. Wearing brace and has good results on RUE. Issued same for LUE. Pt doing exercises at home.    OT Occupational Profile and History Problem Focused Assessment - Including review of records relating to presenting problem    Occupational performance deficits (Please refer to evaluation for details): ADL's;IADL's    Body Structure / Function / Physical Skills ADL;Body mechanics;Coordination;IADL;Pain;Sensation;GMC;ROM;UE functional use;Flexibility;Decreased knowledge of use of DME;FMC;Dexterity;Strength    Rehab Potential Good    Clinical Decision Making Limited treatment options, no task modification necessary    Comorbidities Affecting Occupational Performance: None    Modification or Assistance to Complete Evaluation  No modification of tasks or assist necessary to complete eval    OT Frequency 2x / week    OT  Duration 6 weeks   12 visits   OT Treatment/Interventions Self-care/ADL training;Cryotherapy;Moist Heat;Ultrasound;Fluidtherapy;Paraffin;Therapeutic exercise;DME and/or  AE instruction;Passive range of motion;Therapeutic activities;Patient/family education;Splinting    Plan Fluido, AROM wrist exercises HEP    OT Home Exercise Plan tendon glides    Consulted and Agree with Plan of Care Patient           Patient will benefit from skilled therapeutic intervention in order to improve the following deficits and impairments:   Body Structure / Function / Physical Skills: ADL,Body mechanics,Coordination,IADL,Pain,Sensation,GMC,ROM,UE functional use,Flexibility,Decreased knowledge of use of DME,FMC,Dexterity,Strength       Visit Diagnosis: Pain in right hand  Stiffness of left hand, not elsewhere classified  Carpal tunnel syndrome, bilateral upper limbs  Other disturbances of skin sensation  Stiffness of right hand, not elsewhere classified  Other lack of coordination  Muscle weakness (generalized)  Pain in left hand    Problem List Patient Active Problem List   Diagnosis Date Noted  . Severe sepsis ([place]) 04/25/2020  . Sepsis ([place]) 04/19/2020  . Neuropathic pain 03/01/2020  . S/P TKR (total knee replacement) using cement, left 12/14/2019  . Type II diabetes mellitus, uncontrolled ([place]) 11/15/2019  . Medication management 03/23/2019  . Chronic pain of both knees 01/26/2019  . Trochanteric bursitis, right hip 12/15/2018  . Lumbar radiculopathy 11/06/2018  . Acute right-sided low back pain 10/06/2018  . COPD with acute exacerbation ([place]) 04/20/2018  . Anxiety and depression 04/20/2018  . Pain of left shoulder joint on movement 04/13/2017  . Chronic bronchitis ([place]) 03/29/2017  . Gouty arthropathy 03/02/2017  . Hyperammonemia ([place]) 03/02/2017  . Hypomagnesemia 03/02/2017  . CAP (community acquired pneumonia) 02/18/2017  . Liver cirrhosis ([place]) 02/18/2017  . Leukocytosis 02/18/2017  . Sinus tachycardia 02/18/2017  . Tachypnea 02/18/2017  . RLL pneumonia 02/18/2017  . Increased ammonia level 02/18/2017  . Asthma 11/25/2016  .  Idiopathic progressive polyneuropathy 11/25/2016  . Peripheral vascular disease ([place]) 11/25/2016  . Obesity 11/25/2016  . Retained metal fragment left scapula 12/26/2015  . Fracture of glenoid process of left scapula 12/29/2014  . Status post total shoulder arthroplasty 12/27/2014  . Cocaine dependence in remission ([place]) 09/29/2014  . Severe heroin dependence in sustained remission ([place]) 09/29/2014  . Opiate abuse, episodic ([place]) 09/29/2014  . Mild tetrahydrocannabinol (THC) abuse 09/29/2014  . Chronic pain syndrome 04/18/2014  . Primary osteoarthritis of left shoulder 04/18/2014  . Primary osteoarthritis of right knee 04/18/2014  . Primary osteoarthritis of left knee 04/18/2014  . HYPERTENSION, BENIGN ESSENTIAL 03/22/2010  . CONSTIPATION 03/22/2010  . HEPATITIS C 06/04/1995    Zachery Conch MOT, OTR/L  08/31/2020, 6:39 PM  [facility] [address] [place], [place], [address] Phone: [phone]   Fax:  [phone]  Name: Ashley Riddle MRN: [id number] Date of Birth: 06/01/1946

## 2020-09-03 DIAGNOSIS — M1990 Unspecified osteoarthritis, unspecified site: Secondary | ICD-10-CM | POA: Insufficient documentation

## 2020-09-05 ENCOUNTER — Other Ambulatory Visit: Payer: Self-pay

## 2020-09-05 ENCOUNTER — Encounter: Payer: Self-pay | Admitting: Occupational Therapy

## 2020-09-05 ENCOUNTER — Ambulatory Visit: Payer: Medicare Other | Attending: Internal Medicine | Admitting: Occupational Therapy

## 2020-09-05 DIAGNOSIS — M25642 Stiffness of left hand, not elsewhere classified: Secondary | ICD-10-CM | POA: Diagnosis not present

## 2020-09-05 DIAGNOSIS — M25641 Stiffness of right hand, not elsewhere classified: Secondary | ICD-10-CM | POA: Insufficient documentation

## 2020-09-05 DIAGNOSIS — M79642 Pain in left hand: Secondary | ICD-10-CM | POA: Diagnosis not present

## 2020-09-05 DIAGNOSIS — R208 Other disturbances of skin sensation: Secondary | ICD-10-CM | POA: Diagnosis not present

## 2020-09-05 DIAGNOSIS — M6281 Muscle weakness (generalized): Secondary | ICD-10-CM | POA: Diagnosis not present

## 2020-09-05 DIAGNOSIS — M79641 Pain in right hand: Secondary | ICD-10-CM | POA: Diagnosis not present

## 2020-09-05 DIAGNOSIS — G5603 Carpal tunnel syndrome, bilateral upper limbs: Secondary | ICD-10-CM | POA: Diagnosis not present

## 2020-09-05 DIAGNOSIS — R278 Other lack of coordination: Secondary | ICD-10-CM | POA: Diagnosis not present

## 2020-09-05 NOTE — Patient Instructions (Signed)
1. Grip Strengthening (Resistive Putty)   Squeeze putty using thumb and all fingers. Repeat _20___ times. Do __2__ sessions per day.   2. Roll putty into tube on table and pinch between each finger and thumb x 10 reps each. (can do ring and small finger together)     Copyright  VHI. All rights reserved.   

## 2020-09-05 NOTE — Therapy (Signed)
[facility] [address] [place], [place], [address] Phone: [phone]   Fax:  [phone]  Occupational Therapy Treatment  Patient Details  Name: Ashley Riddle MRN: [id number] Date of Birth: 01-01-1946 Referring Provider (OT): Latanya Presser MD   Encounter Date: 09/05/2020   OT End of Session - 09/05/20 1752    Visit Number 4    Number of Visits 13    Date for OT Re-Evaluation 10/05/20    Authorization Type UHC Medicare  Medicaid Secondary    Authorization Time Period VL:MN 100% covered    OT Start Time 1748    OT Stop Time 1826    OT Time Calculation (min) 38 min    Activity Tolerance Patient tolerated treatment well    Behavior During Therapy [facility] for tasks assessed/performed           Past Medical History:  Diagnosis Date  . Anxiety   . Arthritis   . Constipation   . COPD (chronic obstructive pulmonary disease) ([place])   . DDD (degenerative disc disease), cervical   . DDD (degenerative disc disease), lumbar   . Gout   . Heart murmur    probable bicuspid AV with mild AS, mild MR by 04/22/14 Echo (Dr. Montez Morita)  . Hepatitis C    treated 2016   . History of blood transfusion   . Hypertension   . Pneumonia 12/02/13    Past Surgical History:  Procedure Laterality Date  . ABDOMINAL HYSTERECTOMY    . APPENDECTOMY    . BACK SURGERY    . CESAREAN SECTION     x 3  . COLONOSCOPY W/ POLYPECTOMY    . HARDWARE REMOVAL Left 12/26/2015   Procedure: REMOVAL K-WIRE LEFT SCAPULA;  Surgeon: Garald Balding, MD;  Location: [facility];  Service: Orthopedics;  Laterality: Left;  . INNER EAR SURGERY     blood vessel  . TONSILLECTOMY    . TOTAL SHOULDER ARTHROPLASTY Left 12/27/2014   Procedure: TOTAL SHOULDER ARTHROPLASTY;  Surgeon: Garald Balding, MD;  Location: [facility];  Service: Orthopedics;  Laterality: Left;    There were no vitals filed for this visit.   Subjective Assessment - 09/05/20 1751    Subjective   "last night they were hurting bad - they hadn't been hurting like that in a while"    Pertinent History PMH: Pt presents with carpal tunnel in BUE, DM2, cirrhosis of liver, HTN, OA of knee, asthma    Patient Stated Goals see if I can get it to work better and get numbness out of my hand.    Currently in Pain? Yes    Pain Score 6     Pain Location --   thumbs   Pain Orientation Left;Right    Pain Descriptors / Indicators Aching;Numbness    Pain Type Neuropathic pain    Pain Onset More than a month ago    Pain Frequency Constant             TREATMENT:  Fluidotherapy x 12 minutes for BUE to address pain and stiffness. No adverse reactions.   Wrist Exercises with 1 lb dumbbell - wrist ext/flex, UD/RD, supination/pronation BUE  Tendon Glides BUE  Hammer BUE supination/pronation  Forearm Gym BUE x 5 times around each hand.   Theraputty yellow             OT Education - 09/05/20 1821    Education Details yellow theraputty - see pt instructions    Person(s)  Educated Patient    Methods Explanation;Demonstration;Handout    Comprehension Verbalized understanding;Returned demonstration            OT Short Term Goals - 09/05/20 1818      OT SHORT TERM GOAL #1   Title Pt will be independent with HEP 414/2022    Time 3    Period Weeks    Status On-going    Target Date 09/14/20      OT SHORT TERM GOAL #2   Title Pt will be compliant and verbalize understanding of splint and/or brace wear and care instructions PRN    Time 3    Period Weeks    Status Achieved   issued black wrist brace RUE 08/29/20     OT SHORT TERM GOAL #3   Title Pt will verbalize understanding of adapted strategies and/or activity modifications for increasing independence with ADLs and decreasing pain.   (cutting up food, zippers, hooking bra, etc)    Time 3    Period Weeks    Status On-going      OT SHORT TERM GOAL #4   Title Pt will verbalize understanding of pain management strategies.     Time 3    Period Weeks    Status On-going             OT Long Term Goals - 09/05/20 1818      OT LONG TERM GOAL #1   Title Pt will be independent with any updated HEPs 10/05/20    Time 6    Period Weeks    Status New      OT LONG TERM GOAL #2   Title Pt will increase grip strength in BUE to 40 lbs or greater in order to increase functional use of BUE.    Baseline RUE 38.3, LUE 33.7    Time 6    Period Weeks    Status New      OT LONG TERM GOAL #3   Title Pt will increase fine motor coordination by decreasing 9 hole peg test time by 3 seconds with BUE.    Baseline RUE 31.63s LUE 33.47s    Time 6    Period Weeks    Status New      OT LONG TERM GOAL #4   Title Pt will verbalize understanding of sleep positions for decreasing pain/numbness in BUE    Time 6    Period Weeks    Status On-going      OT LONG TERM GOAL #5   Title Pt will demonstrate composite flexion of 100% in BUE.    Baseline 80-90%    Time 6    Period Weeks    Status New                 Plan - 09/05/20 1809    Clinical Impression Statement Pt reports increase in pain today but overall decrease in pain and discomfort over past week. Pt is benefitting from bilateral wrist splints issued.    OT Occupational Profile and History Problem Focused Assessment - Including review of records relating to presenting problem    Occupational performance deficits (Please refer to evaluation for details): ADL's;IADL's    Body Structure / Function / Physical Skills ADL;Body mechanics;Coordination;IADL;Pain;Sensation;GMC;ROM;UE functional use;Flexibility;Decreased knowledge of use of DME;FMC;Dexterity;Strength    Rehab Potential Good    Clinical Decision Making Limited treatment options, no task modification necessary    Comorbidities Affecting Occupational Performance: None    Modification or Assistance to  Complete Evaluation  No modification of tasks or assist necessary to complete eval    OT Frequency 2x / week     OT Duration 6 weeks   12 visits   OT Treatment/Interventions Self-care/ADL training;Cryotherapy;Moist Heat;Ultrasound;Fluidtherapy;Paraffin;Therapeutic exercise;DME and/or AE instruction;Passive range of motion;Therapeutic activities;Patient/family education;Splinting    Plan Fluido, added weight - make sure wasn't too much.    OT Home Exercise Plan tendon glides    Consulted and Agree with Plan of Care Patient           Patient will benefit from skilled therapeutic intervention in order to improve the following deficits and impairments:   Body Structure / Function / Physical Skills: ADL,Body mechanics,Coordination,IADL,Pain,Sensation,GMC,ROM,UE functional use,Flexibility,Decreased knowledge of use of DME,FMC,Dexterity,Strength       Visit Diagnosis: Pain in right hand  Stiffness of left hand, not elsewhere classified  Carpal tunnel syndrome, bilateral upper limbs  Other disturbances of skin sensation  Stiffness of right hand, not elsewhere classified  Other lack of coordination  Muscle weakness (generalized)  Pain in left hand    Problem List Patient Active Problem List   Diagnosis Date Noted  . Severe sepsis ([place]) 04/25/2020  . Sepsis ([place]) 04/19/2020  . Neuropathic pain 03/01/2020  . S/P TKR (total knee replacement) using cement, left 12/14/2019  . Type II diabetes mellitus, uncontrolled ([place]) 11/15/2019  . Medication management 03/23/2019  . Chronic pain of both knees 01/26/2019  . Trochanteric bursitis, right hip 12/15/2018  . Lumbar radiculopathy 11/06/2018  . Acute right-sided low back pain 10/06/2018  . COPD with acute exacerbation ([place]) 04/20/2018  . Anxiety and depression 04/20/2018  . Pain of left shoulder joint on movement 04/13/2017  . Chronic bronchitis ([place]) 03/29/2017  . Gouty arthropathy 03/02/2017  . Hyperammonemia ([place]) 03/02/2017  . Hypomagnesemia 03/02/2017  . CAP (community acquired pneumonia) 02/18/2017  . Liver cirrhosis ([place])  02/18/2017  . Leukocytosis 02/18/2017  . Sinus tachycardia 02/18/2017  . Tachypnea 02/18/2017  . RLL pneumonia 02/18/2017  . Increased ammonia level 02/18/2017  . Asthma 11/25/2016  . Idiopathic progressive polyneuropathy 11/25/2016  . Peripheral vascular disease ([place]) 11/25/2016  . Obesity 11/25/2016  . Retained metal fragment left scapula 12/26/2015  . Fracture of glenoid process of left scapula 12/29/2014  . Status post total shoulder arthroplasty 12/27/2014  . Cocaine dependence in remission ([place]) 09/29/2014  . Severe heroin dependence in sustained remission ([place]) 09/29/2014  . Opiate abuse, episodic ([place]) 09/29/2014  . Mild tetrahydrocannabinol (THC) abuse 09/29/2014  . Chronic pain syndrome 04/18/2014  . Primary osteoarthritis of left shoulder 04/18/2014  . Primary osteoarthritis of right knee 04/18/2014  . Primary osteoarthritis of left knee 04/18/2014  . HYPERTENSION, BENIGN ESSENTIAL 03/22/2010  . CONSTIPATION 03/22/2010  . HEPATITIS C 06/04/1995    Zachery Conch MOT, OTR/L  09/05/2020, 6:24 PM  [facility] [address] [place], [place], [address] Phone: [phone]   Fax:  [phone]  Name: Ashley Riddle MRN: [id number] Date of Birth: 1945/08/26

## 2020-09-06 ENCOUNTER — Encounter: Payer: Self-pay | Admitting: Occupational Therapy

## 2020-09-06 ENCOUNTER — Ambulatory Visit: Payer: Medicare Other | Admitting: Occupational Therapy

## 2020-09-06 DIAGNOSIS — G5603 Carpal tunnel syndrome, bilateral upper limbs: Secondary | ICD-10-CM

## 2020-09-06 DIAGNOSIS — M25642 Stiffness of left hand, not elsewhere classified: Secondary | ICD-10-CM | POA: Diagnosis not present

## 2020-09-06 DIAGNOSIS — M79642 Pain in left hand: Secondary | ICD-10-CM

## 2020-09-06 DIAGNOSIS — R278 Other lack of coordination: Secondary | ICD-10-CM

## 2020-09-06 DIAGNOSIS — M25641 Stiffness of right hand, not elsewhere classified: Secondary | ICD-10-CM | POA: Diagnosis not present

## 2020-09-06 DIAGNOSIS — M6281 Muscle weakness (generalized): Secondary | ICD-10-CM

## 2020-09-06 DIAGNOSIS — R208 Other disturbances of skin sensation: Secondary | ICD-10-CM

## 2020-09-06 DIAGNOSIS — M79641 Pain in right hand: Secondary | ICD-10-CM | POA: Diagnosis not present

## 2020-09-06 NOTE — Therapy (Signed)
[facility] [address] [place], [place], [address] Phone: [phone]   Fax:  [phone]  Occupational Therapy Treatment  Patient Details  Name: Ashley Riddle MRN: [id number] Date of Birth: [birth year] Referring Provider (OT): Latanya Presser MD   Encounter Date: 09/06/2020   OT End of Session - 09/06/20 1753    Visit Number 5    Number of Visits 13    Date for OT Re-Evaluation 10/05/20    Authorization Type UHC Medicare  Medicaid Secondary    Authorization Time Period VL:MN 100% covered    OT Start Time 8828    OT Stop Time 0034   pt left early   OT Time Calculation (min) 33 min    Activity Tolerance Patient tolerated treatment well    Behavior During Therapy [facility] for tasks assessed/performed           Past Medical History:  Diagnosis Date  . Anxiety   . Arthritis   . Constipation   . COPD (chronic obstructive pulmonary disease) ([place])   . DDD (degenerative disc disease), cervical   . DDD (degenerative disc disease), lumbar   . Gout   . Heart murmur    probable bicuspid AV with mild AS, mild MR by 04/22/14 Echo (Dr. Montez Morita)  . Hepatitis C    treated 2016   . History of blood transfusion   . Hypertension   . Pneumonia 12/02/13    Past Surgical History:  Procedure Laterality Date  . ABDOMINAL HYSTERECTOMY    . APPENDECTOMY    . BACK SURGERY    . CESAREAN SECTION     x 3  . COLONOSCOPY W/ POLYPECTOMY    . HARDWARE REMOVAL Left 12/26/2015   Procedure: REMOVAL K-WIRE LEFT SCAPULA;  Surgeon: Garald Balding, MD;  Location: [facility];  Service: Orthopedics;  Laterality: Left;  . INNER EAR SURGERY     blood vessel  . TONSILLECTOMY    . TOTAL SHOULDER ARTHROPLASTY Left 12/27/2014   Procedure: TOTAL SHOULDER ARTHROPLASTY;  Surgeon: Garald Balding, MD;  Location: [facility];  Service: Orthopedics;  Laterality: Left;    There were no vitals filed for this visit.   Subjective Assessment - 09/06/20 1752     Subjective  "Last night my daughter brought me some cream and oil and I swear it made the numbness go away"    Pertinent History PMH: Pt presents with carpal tunnel in BUE, DM2, cirrhosis of liver, HTN, OA of knee, asthma    Patient Stated Goals see if I can get it to work better and get numbness out of my hand.    Currently in Pain? Yes    Pain Score 5     Pain Orientation Right;Left    Pain Descriptors / Indicators Numbness    Pain Type Neuropathic pain    Pain Onset More than a month ago    Pain Frequency Intermittent    Aggravating Factors  just started after work             TREATMENT:   Fluidotherapy x 12 minutes for BUE to address pain, swelling, and stiffness. No adverse reactions.   Hand Gripper: with BUE on level 1 with silver spring. Pt picked up 1 inch blocks with gripper with min drops and min difficulty.  Supination/Pronation Wheel with BUE in pain free zone.      OT Education - 09/06/20 1819    Education Details instructed to do AROM wrist exercises with  water bottle or can of soup every other day.    Person(s) Educated Patient    Methods Explanation    Comprehension Verbalized understanding            OT Short Term Goals - 09/05/20 1818      OT SHORT TERM GOAL #1   Title Pt will be independent with HEP 414/2022    Time 3    Period Weeks    Status On-going    Target Date 09/14/20      OT SHORT TERM GOAL #2   Title Pt will be compliant and verbalize understanding of splint and/or brace wear and care instructions PRN    Time 3    Period Weeks    Status Achieved   issued black wrist brace RUE 08/29/20     OT SHORT TERM GOAL #3   Title Pt will verbalize understanding of adapted strategies and/or activity modifications for increasing independence with ADLs and decreasing pain.   (cutting up food, zippers, hooking bra, etc)    Time 3    Period Weeks    Status On-going      OT SHORT TERM GOAL #4   Title Pt will verbalize understanding of pain  management strategies.    Time 3    Period Weeks    Status On-going             OT Long Term Goals - 09/05/20 1818      OT LONG TERM GOAL #1   Title Pt will be independent with any updated HEPs 10/05/20    Time 6    Period Weeks    Status New      OT LONG TERM GOAL #2   Title Pt will increase grip strength in BUE to 40 lbs or greater in order to increase functional use of BUE.    Baseline RUE 38.3, LUE 33.7    Time 6    Period Weeks    Status New      OT LONG TERM GOAL #3   Title Pt will increase fine motor coordination by decreasing 9 hole peg test time by 3 seconds with BUE.    Baseline RUE 31.63s LUE 33.47s    Time 6    Period Weeks    Status New      OT LONG TERM GOAL #4   Title Pt will verbalize understanding of sleep positions for decreasing pain/numbness in BUE    Time 6    Period Weeks    Status On-going      OT LONG TERM GOAL #5   Title Pt will demonstrate composite flexion of 100% in BUE.    Baseline 80-90%    Time 6    Period Weeks    Status New                 Plan - 09/06/20 1759    Clinical Impression Statement Pt reports overall benefits and good results from therapy with decrease in pain and increase in comfort.    OT Occupational Profile and History Problem Focused Assessment - Including review of records relating to presenting problem    Occupational performance deficits (Please refer to evaluation for details): ADL's;IADL's    Body Structure / Function / Physical Skills ADL;Body mechanics;Coordination;IADL;Pain;Sensation;GMC;ROM;UE functional use;Flexibility;Decreased knowledge of use of DME;FMC;Dexterity;Strength    Rehab Potential Good    Clinical Decision Making Limited treatment options, no task modification necessary    Comorbidities Affecting Occupational Performance: None  Modification or Assistance to Complete Evaluation  No modification of tasks or assist necessary to complete eval    OT Frequency 2x / week    OT Duration 6  weeks   12 visits   OT Treatment/Interventions Self-care/ADL training;Cryotherapy;Moist Heat;Ultrasound;Fluidtherapy;Paraffin;Therapeutic exercise;DME and/or AE instruction;Passive range of motion;Therapeutic activities;Patient/family education;Splinting    Plan Fluido, check on weighted exc, wrist winder    OT Home Exercise Plan tendon glides, AROM wrist, added weight, theraputty    Consulted and Agree with Plan of Care Patient           Patient will benefit from skilled therapeutic intervention in order to improve the following deficits and impairments:   Body Structure / Function / Physical Skills: ADL,Body mechanics,Coordination,IADL,Pain,Sensation,GMC,ROM,UE functional use,Flexibility,Decreased knowledge of use of DME,FMC,Dexterity,Strength       Visit Diagnosis: Pain in right hand  Stiffness of left hand, not elsewhere classified  Carpal tunnel syndrome, bilateral upper limbs  Other disturbances of skin sensation  Stiffness of right hand, not elsewhere classified  Other lack of coordination  Muscle weakness (generalized)  Pain in left hand    Problem List Patient Active Problem List   Diagnosis Date Noted  . Severe sepsis ([place]) 04/25/2020  . Sepsis ([place]) 04/19/2020  . Neuropathic pain 03/01/2020  . S/P TKR (total knee replacement) using cement, left 12/14/2019  . Type II diabetes mellitus, uncontrolled ([place]) 11/15/2019  . Medication management 03/23/2019  . Chronic pain of both knees 01/26/2019  . Trochanteric bursitis, right hip 12/15/2018  . Lumbar radiculopathy 11/06/2018  . Acute right-sided low back pain 10/06/2018  . COPD with acute exacerbation ([place]) 04/20/2018  . Anxiety and depression 04/20/2018  . Pain of left shoulder joint on movement 04/13/2017  . Chronic bronchitis ([place]) 03/29/2017  . Gouty arthropathy 03/02/2017  . Hyperammonemia ([place]) 03/02/2017  . Hypomagnesemia 03/02/2017  . CAP (community acquired pneumonia) 02/18/2017  . Liver  cirrhosis ([place]) 02/18/2017  . Leukocytosis 02/18/2017  . Sinus tachycardia 02/18/2017  . Tachypnea 02/18/2017  . RLL pneumonia 02/18/2017  . Increased ammonia level 02/18/2017  . Asthma 11/25/2016  . Idiopathic progressive polyneuropathy 11/25/2016  . Peripheral vascular disease ([place]) 11/25/2016  . Obesity 11/25/2016  . Retained metal fragment left scapula 12/26/2015  . Fracture of glenoid process of left scapula 12/29/2014  . Status post total shoulder arthroplasty 12/27/2014  . Cocaine dependence in remission ([place]) 09/29/2014  . Severe heroin dependence in sustained remission ([place]) 09/29/2014  . Opiate abuse, episodic ([place]) 09/29/2014  . Mild tetrahydrocannabinol (THC) abuse 09/29/2014  . Chronic pain syndrome 04/18/2014  . Primary osteoarthritis of left shoulder 04/18/2014  . Primary osteoarthritis of right knee 04/18/2014  . Primary osteoarthritis of left knee 04/18/2014  . HYPERTENSION, BENIGN ESSENTIAL 03/22/2010  . CONSTIPATION 03/22/2010  . HEPATITIS C 06/04/1995    Zachery Conch MOT, OTR/L  09/06/2020, 6:24 PM  [facility] [address] [place], [place], [address] Phone: [phone]   Fax:  [phone]  Name: Marsela Kuan MRN: [id number] Date of Birth: [birth year]

## 2020-09-12 ENCOUNTER — Other Ambulatory Visit: Payer: Self-pay

## 2020-09-12 ENCOUNTER — Encounter: Payer: Self-pay | Admitting: Occupational Therapy

## 2020-09-12 ENCOUNTER — Ambulatory Visit: Payer: Medicare Other | Admitting: Occupational Therapy

## 2020-09-12 DIAGNOSIS — R278 Other lack of coordination: Secondary | ICD-10-CM | POA: Diagnosis not present

## 2020-09-12 DIAGNOSIS — M25642 Stiffness of left hand, not elsewhere classified: Secondary | ICD-10-CM

## 2020-09-12 DIAGNOSIS — R208 Other disturbances of skin sensation: Secondary | ICD-10-CM | POA: Diagnosis not present

## 2020-09-12 DIAGNOSIS — G5603 Carpal tunnel syndrome, bilateral upper limbs: Secondary | ICD-10-CM | POA: Diagnosis not present

## 2020-09-12 DIAGNOSIS — M79642 Pain in left hand: Secondary | ICD-10-CM | POA: Diagnosis not present

## 2020-09-12 DIAGNOSIS — M79641 Pain in right hand: Secondary | ICD-10-CM

## 2020-09-12 DIAGNOSIS — M25641 Stiffness of right hand, not elsewhere classified: Secondary | ICD-10-CM | POA: Diagnosis not present

## 2020-09-12 DIAGNOSIS — M6281 Muscle weakness (generalized): Secondary | ICD-10-CM | POA: Diagnosis not present

## 2020-09-12 NOTE — Therapy (Signed)
[facility] [address] [place], [place], [address] Phone: [phone]   Fax:  [phone]  Occupational Therapy Treatment  Patient Details  Name: Ashley Riddle MRN: [id number] Date of Birth: 09/07/1945 Referring Provider (OT): Latanya Presser MD   Encounter Date: 09/12/2020   OT End of Session - 09/12/20 1748    Visit Number 6    Number of Visits 13    Date for OT Re-Evaluation 10/05/20    Authorization Type UHC Medicare  Medicaid Secondary    Authorization Time Period VL:MN 100% covered    OT Start Time 1746    OT Stop Time 1820    OT Time Calculation (min) 34 min    Activity Tolerance Patient tolerated treatment well    Behavior During Therapy [facility] for tasks assessed/performed           Past Medical History:  Diagnosis Date  . Anxiety   . Arthritis   . Constipation   . COPD (chronic obstructive pulmonary disease) ([place])   . DDD (degenerative disc disease), cervical   . DDD (degenerative disc disease), lumbar   . Gout   . Heart murmur    probable bicuspid AV with mild AS, mild MR by 04/22/14 Echo (Dr. Montez Morita)  . Hepatitis C    treated 2016   . History of blood transfusion   . Hypertension   . Pneumonia 12/02/13    Past Surgical History:  Procedure Laterality Date  . ABDOMINAL HYSTERECTOMY    . APPENDECTOMY    . BACK SURGERY    . CESAREAN SECTION     x 3  . COLONOSCOPY W/ POLYPECTOMY    . HARDWARE REMOVAL Left 12/26/2015   Procedure: REMOVAL K-WIRE LEFT SCAPULA;  Surgeon: Garald Balding, MD;  Location: [facility];  Service: Orthopedics;  Laterality: Left;  . INNER EAR SURGERY     blood vessel  . TONSILLECTOMY    . TOTAL SHOULDER ARTHROPLASTY Left 12/27/2014   Procedure: TOTAL SHOULDER ARTHROPLASTY;  Surgeon: Garald Balding, MD;  Location: [facility];  Service: Orthopedics;  Laterality: Left;    There were no vitals filed for this visit.   Subjective Assessment - 09/12/20 1749     Subjective  Pt reports it's better but it is hurting today.    Pertinent History PMH: Pt presents with carpal tunnel in BUE, DM2, cirrhosis of liver, HTN, OA of knee, asthma    Patient Stated Goals see if I can get it to work better and get numbness out of my hand.    Currently in Pain? Yes    Pain Score 8     Pain Location Hand    Pain Orientation Left;Right    Pain Descriptors / Indicators Numbness;Aching    Pain Type Neuropathic pain;Acute pain    Pain Onset More than a month ago    Pain Frequency Constant    Aggravating Factors  after work              TREATMENT:   Fluidotherapy x 12 minutes for BUE to address pain, swelling, and stiffness. No adverse reactions. Pt reported "feels looser and better" after modality  Resistance Clothespins with BUE 1-8# - put up with RUE and took down from antenna with LUE.  Wrist Winder unweighted with BUE for increase in range of motion and strengthening with BUE wrists.  Hammer bilaterally supination/pronation  Forearm Gym bilaterally   Pt has not changed sleeping positions but reports increased comfort and wearing  bilateral braces at night.               OT Short Term Goals - 09/12/20 1820      OT SHORT TERM GOAL #1   Title Pt will be independent with HEP 414/2022    Time 3    Period Weeks    Status On-going    Target Date 09/14/20      OT SHORT TERM GOAL #2   Title Pt will be compliant and verbalize understanding of splint and/or brace wear and care instructions PRN    Time 3    Period Weeks    Status Achieved   issued black wrist brace RUE 08/29/20     OT SHORT TERM GOAL #3   Title Pt will verbalize understanding of adapted strategies and/or activity modifications for increasing independence with ADLs and decreasing pain.   (cutting up food, zippers, hooking bra, etc)    Time 3    Period Weeks    Status Achieved   pt reports getting easier     OT SHORT TERM GOAL #4   Title Pt will verbalize understanding of  pain management strategies.    Time 3    Period Weeks    Status Achieved             OT Long Term Goals - 09/12/20 1821      OT LONG TERM GOAL #1   Title Pt will be independent with any updated HEPs 10/05/20    Time 6    Period Weeks    Status On-going      OT LONG TERM GOAL #2   Title Pt will increase grip strength in BUE to 40 lbs or greater in order to increase functional use of BUE.    Baseline RUE 38.3, LUE 33.7    Time 6    Period Weeks    Status On-going      OT LONG TERM GOAL #3   Title Pt will increase fine motor coordination by decreasing 9 hole peg test time by 3 seconds with BUE.    Baseline RUE 31.63s LUE 33.47s    Time 6    Period Weeks    Status On-going      OT LONG TERM GOAL #4   Title Pt will verbalize understanding of sleep positions for decreasing pain/numbness in BUE    Time 6    Period Weeks    Status Achieved   pt reports it getting better     OT LONG TERM GOAL #5   Title Pt will demonstrate composite flexion of 100% in BUE.    Baseline 80-90%    Time 6    Period Weeks    Status New                 Plan - 09/12/20 1758    Clinical Impression Statement Pt reports good success with therapy and overall decrease in pain and increased comfort. Pt has misplaced LUE brace.    OT Occupational Profile and History Problem Focused Assessment - Including review of records relating to presenting problem    Occupational performance deficits (Please refer to evaluation for details): ADL's;IADL's    Body Structure / Function / Physical Skills ADL;Body mechanics;Coordination;IADL;Pain;Sensation;GMC;ROM;UE functional use;Flexibility;Decreased knowledge of use of DME;FMC;Dexterity;Strength    Rehab Potential Good    Clinical Decision Making Limited treatment options, no task modification necessary    Comorbidities Affecting Occupational Performance: None    Modification or Assistance to Complete  Evaluation  No modification of tasks or assist necessary  to complete eval    OT Frequency 2x / week    OT Duration 6 weeks   12 visits   OT Treatment/Interventions Self-care/ADL training;Cryotherapy;Moist Heat;Ultrasound;Fluidtherapy;Paraffin;Therapeutic exercise;DME and/or AE instruction;Passive range of motion;Therapeutic activities;Patient/family education;Splinting    Plan Fluido, check on weighted exc, wrist winder    OT Home Exercise Plan tendon glides, AROM wrist, added weight, theraputty    Consulted and Agree with Plan of Care Patient           Patient will benefit from skilled therapeutic intervention in order to improve the following deficits and impairments:   Body Structure / Function / Physical Skills: ADL,Body mechanics,Coordination,IADL,Pain,Sensation,GMC,ROM,UE functional use,Flexibility,Decreased knowledge of use of DME,FMC,Dexterity,Strength       Visit Diagnosis: Pain in right hand  Stiffness of left hand, not elsewhere classified  Carpal tunnel syndrome, bilateral upper limbs  Stiffness of right hand, not elsewhere classified  Other disturbances of skin sensation  Other lack of coordination  Muscle weakness (generalized)  Pain in left hand    Problem List Patient Active Problem List   Diagnosis Date Noted  . Severe sepsis ([place]) 04/25/2020  . Sepsis ([place]) 04/19/2020  . Neuropathic pain 03/01/2020  . S/P TKR (total knee replacement) using cement, left 12/14/2019  . Type II diabetes mellitus, uncontrolled ([place]) 11/15/2019  . Medication management 03/23/2019  . Chronic pain of both knees 01/26/2019  . Trochanteric bursitis, right hip 12/15/2018  . Lumbar radiculopathy 11/06/2018  . Acute right-sided low back pain 10/06/2018  . COPD with acute exacerbation ([place]) 04/20/2018  . Anxiety and depression 04/20/2018  . Pain of left shoulder joint on movement 04/13/2017  . Chronic bronchitis ([place]) 03/29/2017  . Gouty arthropathy 03/02/2017  . Hyperammonemia ([place]) 03/02/2017  . Hypomagnesemia 03/02/2017  .  CAP (community acquired pneumonia) 02/18/2017  . Liver cirrhosis ([place]) 02/18/2017  . Leukocytosis 02/18/2017  . Sinus tachycardia 02/18/2017  . Tachypnea 02/18/2017  . RLL pneumonia 02/18/2017  . Increased ammonia level 02/18/2017  . Asthma 11/25/2016  . Idiopathic progressive polyneuropathy 11/25/2016  . Peripheral vascular disease ([place]) 11/25/2016  . Obesity 11/25/2016  . Retained metal fragment left scapula 12/26/2015  . Fracture of glenoid process of left scapula 12/29/2014  . Status post total shoulder arthroplasty 12/27/2014  . Cocaine dependence in remission ([place]) 09/29/2014  . Severe heroin dependence in sustained remission ([place]) 09/29/2014  . Opiate abuse, episodic ([place]) 09/29/2014  . Mild tetrahydrocannabinol (THC) abuse 09/29/2014  . Chronic pain syndrome 04/18/2014  . Primary osteoarthritis of left shoulder 04/18/2014  . Primary osteoarthritis of right knee 04/18/2014  . Primary osteoarthritis of left knee 04/18/2014  . HYPERTENSION, BENIGN ESSENTIAL 03/22/2010  . CONSTIPATION 03/22/2010  . HEPATITIS C 06/04/1995    Zachery Conch MOT, OTR/L  09/12/2020, 6:22 PM  [facility] [address] [place], [place], [address] Phone: [phone]   Fax:  [phone]  Name: Melida Northington MRN: [id number] Date of Birth: 12/12/1945

## 2020-09-13 ENCOUNTER — Other Ambulatory Visit: Payer: Self-pay | Admitting: Nurse Practitioner

## 2020-09-13 DIAGNOSIS — K7469 Other cirrhosis of liver: Secondary | ICD-10-CM | POA: Diagnosis not present

## 2020-09-14 ENCOUNTER — Ambulatory Visit: Payer: Medicare Other | Admitting: Occupational Therapy

## 2020-09-14 ENCOUNTER — Other Ambulatory Visit: Payer: Self-pay

## 2020-09-14 ENCOUNTER — Encounter: Payer: Self-pay | Admitting: Occupational Therapy

## 2020-09-14 DIAGNOSIS — M79641 Pain in right hand: Secondary | ICD-10-CM | POA: Diagnosis not present

## 2020-09-14 DIAGNOSIS — R208 Other disturbances of skin sensation: Secondary | ICD-10-CM

## 2020-09-14 DIAGNOSIS — M25641 Stiffness of right hand, not elsewhere classified: Secondary | ICD-10-CM

## 2020-09-14 DIAGNOSIS — M79642 Pain in left hand: Secondary | ICD-10-CM

## 2020-09-14 DIAGNOSIS — R278 Other lack of coordination: Secondary | ICD-10-CM | POA: Diagnosis not present

## 2020-09-14 DIAGNOSIS — M25642 Stiffness of left hand, not elsewhere classified: Secondary | ICD-10-CM | POA: Diagnosis not present

## 2020-09-14 DIAGNOSIS — G5603 Carpal tunnel syndrome, bilateral upper limbs: Secondary | ICD-10-CM

## 2020-09-14 DIAGNOSIS — M6281 Muscle weakness (generalized): Secondary | ICD-10-CM | POA: Diagnosis not present

## 2020-09-14 NOTE — Therapy (Signed)
[facility] [address] [place], [place], [address] Phone: [phone]   Fax:  [phone]  Occupational Therapy Treatment  Patient Details  Name: Ashley Riddle MRN: [id number] Date of Birth: 26-Feb-1946 Referring Provider (OT): Latanya Presser MD   Encounter Date: 09/14/2020   OT End of Session - 09/14/20 1751    Visit Number 7    Number of Visits 13    Date for OT Re-Evaluation 10/05/20    Authorization Type UHC Medicare  Medicaid Secondary    Authorization Time Period VL:MN 100% covered    OT Start Time 1745    OT Stop Time 1824    OT Time Calculation (min) 39 min    Activity Tolerance Patient tolerated treatment well    Behavior During Therapy [facility] for tasks assessed/performed           Past Medical History:  Diagnosis Date  . Anxiety   . Arthritis   . Constipation   . COPD (chronic obstructive pulmonary disease) ([place])   . DDD (degenerative disc disease), cervical   . DDD (degenerative disc disease), lumbar   . Gout   . Heart murmur    probable bicuspid AV with mild AS, mild MR by 04/22/14 Echo (Dr. Montez Morita)  . Hepatitis C    treated 2016   . History of blood transfusion   . Hypertension   . Pneumonia 12/02/13    Past Surgical History:  Procedure Laterality Date  . ABDOMINAL HYSTERECTOMY    . APPENDECTOMY    . BACK SURGERY    . CESAREAN SECTION     x 3  . COLONOSCOPY W/ POLYPECTOMY    . HARDWARE REMOVAL Left 12/26/2015   Procedure: REMOVAL K-WIRE LEFT SCAPULA;  Surgeon: Garald Balding, MD;  Location: [facility];  Service: Orthopedics;  Laterality: Left;  . INNER EAR SURGERY     blood vessel  . TONSILLECTOMY    . TOTAL SHOULDER ARTHROPLASTY Left 12/27/2014   Procedure: TOTAL SHOULDER ARTHROPLASTY;  Surgeon: Garald Balding, MD;  Location: [facility];  Service: Orthopedics;  Laterality: Left;    There were no vitals filed for this visit.   Subjective Assessment - 09/14/20 1806     Subjective  Pt came in with significant reports of pain that kept her up last night (7/10 on arrival to therapy). Pt reported decrease in pain with heat/fluido (2-3/10 after heat)    Pertinent History PMH: Pt presents with carpal tunnel in BUE, DM2, cirrhosis of liver, HTN, OA of knee, asthma    Patient Stated Goals see if I can get it to work better and get numbness out of my hand.    Currently in Pain? Yes    Pain Score 7    2-3/10 after fluido   Pain Location Hand    Pain Orientation Right    Pain Descriptors / Indicators Numbness;Aching    Pain Type Neuropathic pain    Pain Onset More than a month ago    Pain Frequency Constant           Fluidotherapy x 12 minutes for BUE to address pain, swelling, and stiffness. No adverse reactions. Pt with AROM while in heat.  Tendon Glides BUE x 15   Forearm Gym BUE x 5 revolutions  AROM Wrist BUE x 15 reps. flex/ext, UD/RD, sup/pro  Joint Protection and Energy Conservation Strategies review              OT Education - 09/14/20 1825  Education Details Engineer, technical sales Strategies - Access Code: [email or web address]    Person(s) Educated Patient    Methods Explanation;Demonstration;Handout    Comprehension Verbalized understanding;Returned demonstration            OT Short Term Goals - 09/12/20 1820      OT SHORT TERM GOAL #1   Title Pt will be independent with HEP 414/2022    Time 3    Period Weeks    Status On-going    Target Date 09/14/20      OT SHORT TERM GOAL #2   Title Pt will be compliant and verbalize understanding of splint and/or brace wear and care instructions PRN    Time 3    Period Weeks    Status Achieved   issued black wrist brace RUE 08/29/20     OT SHORT TERM GOAL #3   Title Pt will verbalize understanding of adapted strategies and/or activity modifications for increasing independence with ADLs and decreasing pain.   (cutting up food, zippers, hooking bra, etc)    Time 3    Period  Weeks    Status Achieved   pt reports getting easier     OT SHORT TERM GOAL #4   Title Pt will verbalize understanding of pain management strategies.    Time 3    Period Weeks    Status Achieved             OT Long Term Goals - 09/14/20 1805      OT LONG TERM GOAL #1   Title Pt will be independent with any updated HEPs 10/05/20    Time 6    Period Weeks    Status On-going      OT LONG TERM GOAL #2   Title Pt will increase grip strength in BUE to 40 lbs or greater in order to increase functional use of BUE.    Baseline RUE 38.3, LUE 33.7    Time 6    Period Weeks    Status On-going      OT LONG TERM GOAL #3   Title Pt will increase fine motor coordination by decreasing 9 hole peg test time by 3 seconds with BUE.    Baseline RUE 31.63s LUE 33.47s    Time 6    Period Weeks    Status On-going      OT LONG TERM GOAL #4   Title Pt will verbalize understanding of sleep positions for decreasing pain/numbness in BUE    Time 6    Period Weeks    Status Achieved   pt reports it getting better     OT LONG TERM GOAL #5   Title Pt will demonstrate composite flexion of 100% in BUE.    Baseline 80-90%    Time 6    Period Weeks    Status On-going                 Plan - 09/14/20 1826    Clinical Impression Statement Pt with increased pain today. Advised to d/c weighted exercises at this time until pain is better managed. Much relief from fluido.    OT Occupational Profile and History Problem Focused Assessment - Including review of records relating to presenting problem    Occupational performance deficits (Please refer to evaluation for details): ADL's;IADL's    Body Structure / Function / Physical Skills ADL;Body mechanics;Coordination;IADL;Pain;Sensation;GMC;ROM;UE functional use;Flexibility;Decreased knowledge of use of DME;FMC;Dexterity;Strength    Rehab Potential Good  Clinical Decision Making Limited treatment options, no task modification necessary     Comorbidities Affecting Occupational Performance: None    Modification or Assistance to Complete Evaluation  No modification of tasks or assist necessary to complete eval    OT Frequency 2x / week    OT Duration 6 weeks   12 visits   OT Treatment/Interventions Self-care/ADL training;Cryotherapy;Moist Heat;Ultrasound;Fluidtherapy;Paraffin;Therapeutic exercise;DME and/or AE instruction;Passive range of motion;Therapeutic activities;Patient/family education;Splinting    Plan Fluido, wrist winder    OT Home Exercise Plan tendon glides, AROM wrist, added weight, theraputty    Consulted and Agree with Plan of Care Patient           Patient will benefit from skilled therapeutic intervention in order to improve the following deficits and impairments:   Body Structure / Function / Physical Skills: ADL,Body mechanics,Coordination,IADL,Pain,Sensation,GMC,ROM,UE functional use,Flexibility,Decreased knowledge of use of DME,FMC,Dexterity,Strength       Visit Diagnosis: Pain in right hand  Pain in left hand  Stiffness of left hand, not elsewhere classified  Carpal tunnel syndrome, bilateral upper limbs  Stiffness of right hand, not elsewhere classified  Other disturbances of skin sensation  Other lack of coordination  Muscle weakness (generalized)    Problem List Patient Active Problem List   Diagnosis Date Noted  . Severe sepsis ([place]) 04/25/2020  . Sepsis ([place]) 04/19/2020  . Neuropathic pain 03/01/2020  . S/P TKR (total knee replacement) using cement, left 12/14/2019  . Type II diabetes mellitus, uncontrolled ([place]) 11/15/2019  . Medication management 03/23/2019  . Chronic pain of both knees 01/26/2019  . Trochanteric bursitis, right hip 12/15/2018  . Lumbar radiculopathy 11/06/2018  . Acute right-sided low back pain 10/06/2018  . COPD with acute exacerbation ([place]) 04/20/2018  . Anxiety and depression 04/20/2018  . Pain of left shoulder joint on movement 04/13/2017  . Chronic  bronchitis ([place]) 03/29/2017  . Gouty arthropathy 03/02/2017  . Hyperammonemia ([place]) 03/02/2017  . Hypomagnesemia 03/02/2017  . CAP (community acquired pneumonia) 02/18/2017  . Liver cirrhosis ([place]) 02/18/2017  . Leukocytosis 02/18/2017  . Sinus tachycardia 02/18/2017  . Tachypnea 02/18/2017  . RLL pneumonia 02/18/2017  . Increased ammonia level 02/18/2017  . Asthma 11/25/2016  . Idiopathic progressive polyneuropathy 11/25/2016  . Peripheral vascular disease ([place]) 11/25/2016  . Obesity 11/25/2016  . Retained metal fragment left scapula 12/26/2015  . Fracture of glenoid process of left scapula 12/29/2014  . Status post total shoulder arthroplasty 12/27/2014  . Cocaine dependence in remission ([place]) 09/29/2014  . Severe heroin dependence in sustained remission ([place]) 09/29/2014  . Opiate abuse, episodic ([place]) 09/29/2014  . Mild tetrahydrocannabinol (THC) abuse 09/29/2014  . Chronic pain syndrome 04/18/2014  . Primary osteoarthritis of left shoulder 04/18/2014  . Primary osteoarthritis of right knee 04/18/2014  . Primary osteoarthritis of left knee 04/18/2014  . HYPERTENSION, BENIGN ESSENTIAL 03/22/2010  . CONSTIPATION 03/22/2010  . HEPATITIS C 06/04/1995    Elmon Else Ryna Beckstrom MOT, OTR/L  09/14/2020, 6:27 PM  [facility] [address] [place], [place], [address] Phone: [phone]   Fax:  [phone]  Name: Ashley Riddle MRN: [id number] Date of Birth: 27-Nov-1945

## 2020-09-14 NOTE — Patient Instructions (Signed)
Access Code: [email or web address] URL: [email or web address] Date: 09/14/2020 Prepared by: Waldo Laine  Patient Education Tips for Protecting Your Hands & Wrists

## 2020-09-15 DIAGNOSIS — K7469 Other cirrhosis of liver: Secondary | ICD-10-CM | POA: Diagnosis not present

## 2020-09-19 ENCOUNTER — Other Ambulatory Visit: Payer: Self-pay

## 2020-09-19 ENCOUNTER — Ambulatory Visit: Payer: Medicare Other | Admitting: Occupational Therapy

## 2020-09-19 DIAGNOSIS — G5603 Carpal tunnel syndrome, bilateral upper limbs: Secondary | ICD-10-CM

## 2020-09-19 DIAGNOSIS — R278 Other lack of coordination: Secondary | ICD-10-CM

## 2020-09-19 DIAGNOSIS — M79642 Pain in left hand: Secondary | ICD-10-CM | POA: Diagnosis not present

## 2020-09-19 DIAGNOSIS — M79641 Pain in right hand: Secondary | ICD-10-CM | POA: Diagnosis not present

## 2020-09-19 DIAGNOSIS — M25641 Stiffness of right hand, not elsewhere classified: Secondary | ICD-10-CM

## 2020-09-19 DIAGNOSIS — R208 Other disturbances of skin sensation: Secondary | ICD-10-CM | POA: Diagnosis not present

## 2020-09-19 DIAGNOSIS — M6281 Muscle weakness (generalized): Secondary | ICD-10-CM

## 2020-09-19 DIAGNOSIS — M25642 Stiffness of left hand, not elsewhere classified: Secondary | ICD-10-CM

## 2020-09-19 NOTE — Therapy (Signed)
[facility] [address] [place], [place], [address] Phone: [phone]   Fax:  [phone]  Occupational Therapy Treatment  Patient Details  Name: Ashley Riddle MRN: [id number] Date of Birth: [birth year] Referring Provider (OT): Latanya Presser MD   Encounter Date: 09/19/2020   OT End of Session - 09/19/20 1749    Visit Number 8    Number of Visits 13    Date for OT Re-Evaluation 10/05/20    Authorization Type UHC Medicare  Medicaid Secondary    Authorization Time Period VL:MN 100% covered    OT Start Time 1748    OT Stop Time 1830    OT Time Calculation (min) 42 min    Activity Tolerance Patient tolerated treatment well    Behavior During Therapy [facility] for tasks assessed/performed           Past Medical History:  Diagnosis Date  . Anxiety   . Arthritis   . Constipation   . COPD (chronic obstructive pulmonary disease) ([place])   . DDD (degenerative disc disease), cervical   . DDD (degenerative disc disease), lumbar   . Gout   . Heart murmur    probable bicuspid AV with mild AS, mild MR by 04/22/14 Echo (Dr. Montez Morita)  . Hepatitis C    treated 2016   . History of blood transfusion   . Hypertension   . Pneumonia 12/02/13    Past Surgical History:  Procedure Laterality Date  . ABDOMINAL HYSTERECTOMY    . APPENDECTOMY    . BACK SURGERY    . CESAREAN SECTION     x 3  . COLONOSCOPY W/ POLYPECTOMY    . HARDWARE REMOVAL Left 12/26/2015   Procedure: REMOVAL K-WIRE LEFT SCAPULA;  Surgeon: Garald Balding, MD;  Location: [facility];  Service: Orthopedics;  Laterality: Left;  . INNER EAR SURGERY     blood vessel  . TONSILLECTOMY    . TOTAL SHOULDER ARTHROPLASTY Left 12/27/2014   Procedure: TOTAL SHOULDER ARTHROPLASTY;  Surgeon: Garald Balding, MD;  Location: [employer];  Service: Orthopedics;  Laterality: Left;    There were no vitals filed for this visit.   Subjective Assessment - 09/19/20 1749     Subjective  Pt reports the pain is a little better.    Pertinent History PMH: Pt presents with carpal tunnel in BUE, DM2, cirrhosis of liver, HTN, OA of knee, asthma    Patient Stated Goals see if I can get it to work better and get numbness out of my hand.    Currently in Pain? Yes    Pain Score 5     Pain Location Hand    Pain Orientation Right    Pain Descriptors / Indicators Numbness;Aching    Pain Type Acute pain;Neuropathic pain    Pain Onset More than a month ago    Pain Frequency Constant            TREATMENT  Fluidotherapy x 12 minutes for BUE to address pain, swelling, and stiffness. No adverse reactions.   Pt reports increased discomfort in the thenar region of the LUE this day. Pt tested with Wynn Maudlin test - positive test.  Issued long [place] left, large brace for suspecting DeQuervain's Syndrome in LUE  Grooved Pegs w RUE with min difficulty and drops d/t sensation deficits in RUE and wrist mobility.   Sensory Reeducation for RUE - myofascial tactile ball, Stereognosis 5/5 accuracy  OT Short Term Goals - 09/12/20 1820      OT SHORT TERM GOAL #1   Title Pt will be independent with HEP 414/2022    Time 3    Period Weeks    Status On-going    Target Date 09/14/20      OT SHORT TERM GOAL #2   Title Pt will be compliant and verbalize understanding of splint and/or brace wear and care instructions PRN    Time 3    Period Weeks    Status Achieved   issued black wrist brace RUE 08/29/20     OT SHORT TERM GOAL #3   Title Pt will verbalize understanding of adapted strategies and/or activity modifications for increasing independence with ADLs and decreasing pain.   (cutting up food, zippers, hooking bra, etc)    Time 3    Period Weeks    Status Achieved   pt reports getting easier     OT SHORT TERM GOAL #4   Title Pt will verbalize understanding of pain management strategies.    Time 3    Period Weeks    Status Achieved              OT Long Term Goals - 09/19/20 1752      OT LONG TERM GOAL #1   Title Pt will be independent with any updated HEPs 10/05/20    Time 6    Period Weeks    Status On-going      OT LONG TERM GOAL #2   Title Pt will increase grip strength in BUE to 40 lbs or greater in order to increase functional use of BUE.    Baseline RUE 38.3, LUE 33.7    Time 6    Period Weeks    Status Partially Met   R - 41.6lbs  L - 39.6lbs - partially met with R     OT LONG TERM GOAL #3   Title Pt will increase fine motor coordination by decreasing 9 hole peg test time by 3 seconds with BUE.    Baseline RUE 31.63s LUE 33.47s    Time 6    Period Weeks    Status On-going      OT LONG TERM GOAL #4   Title Pt will verbalize understanding of sleep positions for decreasing pain/numbness in BUE    Time 6    Period Weeks    Status Achieved   pt reports it getting better     OT LONG TERM GOAL #5   Title Pt will demonstrate composite flexion of 100% in BUE.    Baseline 80-90%    Time 6    Period Weeks    Status On-going                 Plan - 09/19/20 1824    Clinical Impression Statement Pt with positive Finklesteins test in LUE - pt issued [place] brace for LUE for comfort and support d/t nature of work and unable to wear hard splint or thumb spica wrist brace.    OT Occupational Profile and History Problem Focused Assessment - Including review of records relating to presenting problem    Occupational performance deficits (Please refer to evaluation for details): ADL's;IADL's    Body Structure / Function / Physical Skills ADL;Body mechanics;Coordination;IADL;Pain;Sensation;GMC;ROM;UE functional use;Flexibility;Decreased knowledge of use of DME;FMC;Dexterity;Strength    Rehab Potential Good    Clinical Decision Making Limited treatment options, no task modification necessary    Comorbidities Affecting Occupational  Performance: None    Modification or Assistance to Complete Evaluation  No modification  of tasks or assist necessary to complete eval    OT Frequency 2x / week    OT Duration 6 weeks   12 visits   OT Treatment/Interventions Self-care/ADL training;Cryotherapy;Moist Heat;Ultrasound;Fluidtherapy;Paraffin;Therapeutic exercise;DME and/or AE instruction;Passive range of motion;Therapeutic activities;Patient/family education;Splinting    Plan Fluido, wrist winder    OT Home Exercise Plan tendon glides, AROM wrist, added weight, theraputty    Consulted and Agree with Plan of Care Patient           Patient will benefit from skilled therapeutic intervention in order to improve the following deficits and impairments:   Body Structure / Function / Physical Skills: ADL,Body mechanics,Coordination,IADL,Pain,Sensation,GMC,ROM,UE functional use,Flexibility,Decreased knowledge of use of DME,FMC,Dexterity,Strength       Visit Diagnosis: Pain in right hand  Pain in left hand  Stiffness of left hand, not elsewhere classified  Carpal tunnel syndrome, bilateral upper limbs  Stiffness of right hand, not elsewhere classified  Other lack of coordination  Muscle weakness (generalized)    Problem List Patient Active Problem List   Diagnosis Date Noted  . Severe sepsis ([place]) 04/25/2020  . Sepsis ([place]) 04/19/2020  . Neuropathic pain 03/01/2020  . S/P TKR (total knee replacement) using cement, left 12/14/2019  . Type II diabetes mellitus, uncontrolled ([place]) 11/15/2019  . Medication management 03/23/2019  . Chronic pain of both knees 01/26/2019  . Trochanteric bursitis, right hip 12/15/2018  . Lumbar radiculopathy 11/06/2018  . Acute right-sided low back pain 10/06/2018  . COPD with acute exacerbation ([place]) 04/20/2018  . Anxiety and depression 04/20/2018  . Pain of left shoulder joint on movement 04/13/2017  . Chronic bronchitis ([place]) 03/29/2017  . Gouty arthropathy 03/02/2017  . Hyperammonemia ([place]) 03/02/2017  . Hypomagnesemia 03/02/2017  . CAP (community acquired pneumonia)  02/18/2017  . Liver cirrhosis ([place]) 02/18/2017  . Leukocytosis 02/18/2017  . Sinus tachycardia 02/18/2017  . Tachypnea 02/18/2017  . RLL pneumonia 02/18/2017  . Increased ammonia level 02/18/2017  . Asthma 11/25/2016  . Idiopathic progressive polyneuropathy 11/25/2016  . Peripheral vascular disease ([place]) 11/25/2016  . Obesity 11/25/2016  . Retained metal fragment left scapula 12/26/2015  . Fracture of glenoid process of left scapula 12/29/2014  . Status post total shoulder arthroplasty 12/27/2014  . Cocaine dependence in remission ([place]) 09/29/2014  . Severe heroin dependence in sustained remission ([place]) 09/29/2014  . Opiate abuse, episodic ([place]) 09/29/2014  . Mild tetrahydrocannabinol (THC) abuse 09/29/2014  . Chronic pain syndrome 04/18/2014  . Primary osteoarthritis of left shoulder 04/18/2014  . Primary osteoarthritis of right knee 04/18/2014  . Primary osteoarthritis of left knee 04/18/2014  . HYPERTENSION, BENIGN ESSENTIAL 03/22/2010  . CONSTIPATION 03/22/2010  . HEPATITIS C 06/04/1995    Elmon Else Terion Hedman MOT, OTR/L  09/19/2020, 6:25 PM  [facility] [address] [place], [place], [address] Phone: [phone]   Fax:  [phone]  Name: Ashley Riddle MRN: [id number] Date of Birth: 1945/12/26

## 2020-09-21 ENCOUNTER — Other Ambulatory Visit: Payer: Self-pay

## 2020-09-21 ENCOUNTER — Ambulatory Visit: Payer: Medicare Other | Admitting: Occupational Therapy

## 2020-09-21 ENCOUNTER — Encounter: Payer: Self-pay | Admitting: Occupational Therapy

## 2020-09-21 DIAGNOSIS — M79642 Pain in left hand: Secondary | ICD-10-CM

## 2020-09-21 DIAGNOSIS — M79641 Pain in right hand: Secondary | ICD-10-CM

## 2020-09-21 DIAGNOSIS — R208 Other disturbances of skin sensation: Secondary | ICD-10-CM | POA: Diagnosis not present

## 2020-09-21 DIAGNOSIS — G5603 Carpal tunnel syndrome, bilateral upper limbs: Secondary | ICD-10-CM

## 2020-09-21 DIAGNOSIS — I10 Essential (primary) hypertension: Secondary | ICD-10-CM | POA: Diagnosis not present

## 2020-09-21 DIAGNOSIS — M25642 Stiffness of left hand, not elsewhere classified: Secondary | ICD-10-CM

## 2020-09-21 DIAGNOSIS — E785 Hyperlipidemia, unspecified: Secondary | ICD-10-CM | POA: Diagnosis not present

## 2020-09-21 DIAGNOSIS — M6281 Muscle weakness (generalized): Secondary | ICD-10-CM | POA: Diagnosis not present

## 2020-09-21 DIAGNOSIS — R278 Other lack of coordination: Secondary | ICD-10-CM

## 2020-09-21 DIAGNOSIS — M25641 Stiffness of right hand, not elsewhere classified: Secondary | ICD-10-CM

## 2020-09-21 DIAGNOSIS — M1009 Idiopathic gout, multiple sites: Secondary | ICD-10-CM | POA: Diagnosis not present

## 2020-09-21 NOTE — Therapy (Signed)
[facility] [address] [place], [place], [address] Phone: [phone]   Fax:  [phone]  Occupational Therapy Treatment  Patient Details  Name: Ashley Riddle MRN: [id number] Date of Birth: 1945/10/05 Referring Provider (OT): Latanya Presser MD   Encounter Date: 09/21/2020   OT End of Session - 09/21/20 1751    Visit Number 9    Number of Visits 13    Date for OT Re-Evaluation 10/05/20    Authorization Type UHC Medicare  Medicaid Secondary    Authorization Time Period VL:MN 100% covered    OT Start Time 1748    OT Stop Time 1830    OT Time Calculation (min) 42 min    Activity Tolerance Patient tolerated treatment well    Behavior During Therapy [facility] for tasks assessed/performed           Past Medical History:  Diagnosis Date  . Anxiety   . Arthritis   . Constipation   . COPD (chronic obstructive pulmonary disease) ([place])   . DDD (degenerative disc disease), cervical   . DDD (degenerative disc disease), lumbar   . Gout   . Heart murmur    probable bicuspid AV with mild AS, mild MR by 04/22/14 Echo (Dr. Montez Morita)  . Hepatitis C    treated 2016   . History of blood transfusion   . Hypertension   . Pneumonia 12/02/13    Past Surgical History:  Procedure Laterality Date  . ABDOMINAL HYSTERECTOMY    . APPENDECTOMY    . BACK SURGERY    . CESAREAN SECTION     x 3  . COLONOSCOPY W/ POLYPECTOMY    . HARDWARE REMOVAL Left 12/26/2015   Procedure: REMOVAL K-WIRE LEFT SCAPULA;  Surgeon: Garald Balding, MD;  Location: [facility];  Service: Orthopedics;  Laterality: Left;  . INNER EAR SURGERY     blood vessel  . TONSILLECTOMY    . TOTAL SHOULDER ARTHROPLASTY Left 12/27/2014   Procedure: TOTAL SHOULDER ARTHROPLASTY;  Surgeon: Garald Balding, MD;  Location: [facility];  Service: Orthopedics;  Laterality: Left;    There were no vitals filed for this visit.   Subjective Assessment - 09/21/20 1751     Subjective  My doctor told me I'm supposed to get a nerve test done and no one has called me    Pertinent History PMH: Pt presents with carpal tunnel in BUE, DM2, cirrhosis of liver, HTN, OA of knee, asthma    Patient Stated Goals see if I can get it to work better and get numbness out of my hand.    Currently in Pain? Yes    Pain Location Hand    Pain Orientation Left;Right    Pain Descriptors / Indicators Numbness;Tingling    Pain Type Neuropathic pain    Pain Onset More than a month ago    Pain Frequency Constant                        OT Treatments/Exercises (OP) - 09/21/20 1828      Modalities   Modalities Ultrasound      Ultrasound   Ultrasound Location BUE wrists    Ultrasound Parameters 0.8w/cm2, 51mz, 8 minutes, continuous    Ultrasound Goals Pain                  OT Education - 09/21/20 1829    Education Details AROM median glides at hand    Person(s)  Educated Patient    Methods Explanation;Demonstration;Handout    Comprehension Verbalized understanding;Returned demonstration            OT Short Term Goals - 09/12/20 1820      OT SHORT TERM GOAL #1   Title Pt will be independent with HEP 414/2022    Time 3    Period Weeks    Status On-going    Target Date 09/14/20      OT SHORT TERM GOAL #2   Title Pt will be compliant and verbalize understanding of splint and/or brace wear and care instructions PRN    Time 3    Period Weeks    Status Achieved   issued black wrist brace RUE 08/29/20     OT SHORT TERM GOAL #3   Title Pt will verbalize understanding of adapted strategies and/or activity modifications for increasing independence with ADLs and decreasing pain.   (cutting up food, zippers, hooking bra, etc)    Time 3    Period Weeks    Status Achieved   pt reports getting easier     OT SHORT TERM GOAL #4   Title Pt will verbalize understanding of pain management strategies.    Time 3    Period Weeks    Status Achieved              OT Long Term Goals - 09/19/20 1752      OT LONG TERM GOAL #1   Title Pt will be independent with any updated HEPs 10/05/20    Time 6    Period Weeks    Status On-going      OT LONG TERM GOAL #2   Title Pt will increase grip strength in BUE to 40 lbs or greater in order to increase functional use of BUE.    Baseline RUE 38.3, LUE 33.7    Time 6    Period Weeks    Status Partially Met   R - 41.6lbs  L - 39.6lbs - partially met with R     OT LONG TERM GOAL #3   Title Pt will increase fine motor coordination by decreasing 9 hole peg test time by 3 seconds with BUE.    Baseline RUE 31.63s LUE 33.47s    Time 6    Period Weeks    Status On-going      OT LONG TERM GOAL #4   Title Pt will verbalize understanding of sleep positions for decreasing pain/numbness in BUE    Time 6    Period Weeks    Status Achieved   pt reports it getting better     OT LONG TERM GOAL #5   Title Pt will demonstrate composite flexion of 100% in BUE.    Baseline 80-90%    Time 6    Period Weeks    Status On-going                 Plan - 09/21/20 1830    Clinical Impression Statement Pt with ultrasound treatment today - no adverse reactions. Pt reported decreased numbness after ultrasound    OT Occupational Profile and History Problem Focused Assessment - Including review of records relating to presenting problem    Occupational performance deficits (Please refer to evaluation for details): ADL's;IADL's    Body Structure / Function / Physical Skills ADL;Body mechanics;Coordination;IADL;Pain;Sensation;GMC;ROM;UE functional use;Flexibility;Decreased knowledge of use of DME;FMC;Dexterity;Strength    Rehab Potential Good    Clinical Decision Making Limited treatment options, no task modification  necessary    Comorbidities Affecting Occupational Performance: None    Modification or Assistance to Complete Evaluation  No modification of tasks or assist necessary to complete eval    OT Frequency 2x /  week    OT Duration 6 weeks   12 visits   OT Treatment/Interventions Self-care/ADL training;Cryotherapy;Moist Heat;Ultrasound;Fluidtherapy;Paraffin;Therapeutic exercise;DME and/or AE instruction;Passive range of motion;Therapeutic activities;Patient/family education;Splinting    Plan Fluido, wrist winder, ultrasound?    OT Home Exercise Plan tendon glides, AROM wrist, added weight, theraputty    Consulted and Agree with Plan of Care Patient           Patient will benefit from skilled therapeutic intervention in order to improve the following deficits and impairments:   Body Structure / Function / Physical Skills: ADL,Body mechanics,Coordination,IADL,Pain,Sensation,GMC,ROM,UE functional use,Flexibility,Decreased knowledge of use of DME,FMC,Dexterity,Strength       Visit Diagnosis: Pain in right hand  Pain in left hand  Stiffness of left hand, not elsewhere classified  Carpal tunnel syndrome, bilateral upper limbs  Stiffness of right hand, not elsewhere classified  Other lack of coordination  Muscle weakness (generalized)    Problem List Patient Active Problem List   Diagnosis Date Noted  . Severe sepsis ([place]) 04/25/2020  . Sepsis ([place]) 04/19/2020  . Neuropathic pain 03/01/2020  . S/P TKR (total knee replacement) using cement, left 12/14/2019  . Type II diabetes mellitus, uncontrolled ([place]) 11/15/2019  . Medication management 03/23/2019  . Chronic pain of both knees 01/26/2019  . Trochanteric bursitis, right hip 12/15/2018  . Lumbar radiculopathy 11/06/2018  . Acute right-sided low back pain 10/06/2018  . COPD with acute exacerbation ([place]) 04/20/2018  . Anxiety and depression 04/20/2018  . Pain of left shoulder joint on movement 04/13/2017  . Chronic bronchitis ([place]) 03/29/2017  . Gouty arthropathy 03/02/2017  . Hyperammonemia ([place]) 03/02/2017  . Hypomagnesemia 03/02/2017  . CAP (community acquired pneumonia) 02/18/2017  . Liver cirrhosis ([place]) 02/18/2017  .  Leukocytosis 02/18/2017  . Sinus tachycardia 02/18/2017  . Tachypnea 02/18/2017  . RLL pneumonia 02/18/2017  . Increased ammonia level 02/18/2017  . Asthma 11/25/2016  . Idiopathic progressive polyneuropathy 11/25/2016  . Peripheral vascular disease ([place]) 11/25/2016  . Obesity 11/25/2016  . Retained metal fragment left scapula 12/26/2015  . Fracture of glenoid process of left scapula 12/29/2014  . Status post total shoulder arthroplasty 12/27/2014  . Cocaine dependence in remission ([place]) 09/29/2014  . Severe heroin dependence in sustained remission ([place]) 09/29/2014  . Opiate abuse, episodic ([place]) 09/29/2014  . Mild tetrahydrocannabinol (THC) abuse 09/29/2014  . Chronic pain syndrome 04/18/2014  . Primary osteoarthritis of left shoulder 04/18/2014  . Primary osteoarthritis of right knee 04/18/2014  . Primary osteoarthritis of left knee 04/18/2014  . HYPERTENSION, BENIGN ESSENTIAL 03/22/2010  . CONSTIPATION 03/22/2010  . HEPATITIS C 06/04/1995    Zachery Conch MOT, OTR/L  09/21/2020, 6:31 PM  [facility] [address] [place], [place], [address] Phone: [phone]   Fax:  [phone]  Name: Aydan [place] MRN: [id number] Date of Birth: Sep 05, 1945

## 2020-09-26 ENCOUNTER — Ambulatory Visit: Payer: Medicare Other | Admitting: Occupational Therapy

## 2020-09-28 ENCOUNTER — Ambulatory Visit: Payer: Medicare Other | Admitting: Occupational Therapy

## 2020-09-29 DIAGNOSIS — H25042 Posterior subcapsular polar age-related cataract, left eye: Secondary | ICD-10-CM | POA: Diagnosis not present

## 2020-09-29 DIAGNOSIS — E119 Type 2 diabetes mellitus without complications: Secondary | ICD-10-CM | POA: Diagnosis not present

## 2020-09-29 DIAGNOSIS — H2513 Age-related nuclear cataract, bilateral: Secondary | ICD-10-CM | POA: Diagnosis not present

## 2020-09-29 DIAGNOSIS — H35363 Drusen (degenerative) of macula, bilateral: Secondary | ICD-10-CM | POA: Diagnosis not present

## 2020-09-29 DIAGNOSIS — H35033 Hypertensive retinopathy, bilateral: Secondary | ICD-10-CM | POA: Diagnosis not present

## 2020-09-29 DIAGNOSIS — H524 Presbyopia: Secondary | ICD-10-CM | POA: Diagnosis not present

## 2020-09-29 DIAGNOSIS — H25013 Cortical age-related cataract, bilateral: Secondary | ICD-10-CM | POA: Diagnosis not present

## 2020-10-03 ENCOUNTER — Other Ambulatory Visit: Payer: Self-pay

## 2020-10-03 ENCOUNTER — Ambulatory Visit: Payer: Medicare Other | Admitting: Occupational Therapy

## 2020-10-03 ENCOUNTER — Encounter: Payer: Self-pay | Admitting: Occupational Therapy

## 2020-10-03 ENCOUNTER — Ambulatory Visit
Admission: RE | Admit: 2020-10-03 | Discharge: 2020-10-03 | Disposition: A | Discharge status: Home / Self Care | Payer: Medicare Other | Source: Ambulatory Visit | Attending: Nurse Practitioner | Admitting: Nurse Practitioner

## 2020-10-03 ENCOUNTER — Ambulatory Visit: Payer: Medicare Other | Attending: Internal Medicine | Admitting: Occupational Therapy

## 2020-10-03 DIAGNOSIS — R278 Other lack of coordination: Secondary | ICD-10-CM

## 2020-10-03 DIAGNOSIS — K7460 Unspecified cirrhosis of liver: Secondary | ICD-10-CM | POA: Diagnosis not present

## 2020-10-03 DIAGNOSIS — M6281 Muscle weakness (generalized): Secondary | ICD-10-CM | POA: Diagnosis not present

## 2020-10-03 DIAGNOSIS — M79642 Pain in left hand: Secondary | ICD-10-CM | POA: Diagnosis not present

## 2020-10-03 DIAGNOSIS — G5603 Carpal tunnel syndrome, bilateral upper limbs: Secondary | ICD-10-CM | POA: Diagnosis not present

## 2020-10-03 DIAGNOSIS — M79641 Pain in right hand: Secondary | ICD-10-CM | POA: Insufficient documentation

## 2020-10-03 DIAGNOSIS — K7469 Other cirrhosis of liver: Secondary | ICD-10-CM

## 2020-10-03 DIAGNOSIS — M25641 Stiffness of right hand, not elsewhere classified: Secondary | ICD-10-CM

## 2020-10-03 DIAGNOSIS — M25642 Stiffness of left hand, not elsewhere classified: Secondary | ICD-10-CM

## 2020-10-03 NOTE — Patient Instructions (Signed)
Neurovascular: Median Nerve Glide With Cervical Bias - Supine    Lie with neck supported, right arm out to side, elbow straight, thumb down, fingers and wrist bent back. Slowly move opposite side ear toward shoulder as far as possible without pain. Repeat ____ times per set. Do ____ sets per session. Do ____ sessions per week.  Copyright  VHI. All rights reserved.

## 2020-10-03 NOTE — Therapy (Signed)
[facility] [address] [place], [place], [address] Phone: [phone]   Fax:  [phone]  Occupational Therapy Treatment  Patient Details  Name: Ashley Riddle MRN: [id number] Date of Birth: Jun 24, 1945 Referring Provider (OT): Latanya Presser MD   Encounter Date: 10/03/2020   OT End of Session - 10/03/20 1807    Visit Number 10    Number of Visits 13    Date for OT Re-Evaluation 10/05/20    Authorization Type UHC Medicare  Medicaid Secondary    Authorization Time Period VL:MN 100% covered    OT Start Time 1546    OT Stop Time 1630    OT Time Calculation (min) 44 min    Activity Tolerance Patient tolerated treatment well    Behavior During Therapy [facility] for tasks assessed/performed           Past Medical History:  Diagnosis Date  . Anxiety   . Arthritis   . Constipation   . COPD (chronic obstructive pulmonary disease) ([place])   . DDD (degenerative disc disease), cervical   . DDD (degenerative disc disease), lumbar   . Gout   . Heart murmur    probable bicuspid AV with mild AS, mild MR by 04/22/14 Echo (Dr. Montez Morita)  . Hepatitis C    treated 2016   . History of blood transfusion   . Hypertension   . Pneumonia 12/02/13    Past Surgical History:  Procedure Laterality Date  . ABDOMINAL HYSTERECTOMY    . APPENDECTOMY    . BACK SURGERY    . CESAREAN SECTION     x 3  . COLONOSCOPY W/ POLYPECTOMY    . HARDWARE REMOVAL Left 12/26/2015   Procedure: REMOVAL K-WIRE LEFT SCAPULA;  Surgeon: Garald Balding, MD;  Location: [facility];  Service: Orthopedics;  Laterality: Left;  . INNER EAR SURGERY     blood vessel  . TONSILLECTOMY    . TOTAL SHOULDER ARTHROPLASTY Left 12/27/2014   Procedure: TOTAL SHOULDER ARTHROPLASTY;  Surgeon: Garald Balding, MD;  Location: [facility];  Service: Orthopedics;  Laterality: Left;    There were no vitals filed for this visit.   Subjective Assessment - 10/03/20 1808     Subjective  My hand was hurting so bad the other day I couldn't go to my granddaughter's shower.    Pertinent History PMH: Pt presents with carpal tunnel in BUE, DM2, cirrhosis of liver, HTN, OA of knee, asthma    Patient Stated Goals see if I can get it to work better and get numbness out of my hand.    Currently in Pain? Yes    Pain Score 4     Pain Location Hand    Pain Orientation Left;Right    Pain Descriptors / Indicators Numbness;Tingling    Pain Type Neuropathic pain;Chronic pain    Pain Onset More than a month ago    Pain Frequency Intermittent    Aggravating Factors  overuse    Pain Relieving Factors Voltaren                Fluidotherapy x 12 minutes for BUE to address pain, swelling, and stiffness. No adverse reactions.   Tendon Glides BUE  Forearm Gym                   OT Education - 10/03/20 1820    Education Details supine median glide for sleeping discomfort.    Person(s) Educated Patient    Methods Explanation;Demonstration;Handout  Comprehension Verbalized understanding;Returned demonstration            OT Short Term Goals - 09/12/20 1820      OT SHORT TERM GOAL #1   Title Pt will be independent with HEP 414/2022    Time 3    Period Weeks    Status On-going    Target Date 09/14/20      OT SHORT TERM GOAL #2   Title Pt will be compliant and verbalize understanding of splint and/or brace wear and care instructions PRN    Time 3    Period Weeks    Status Achieved   issued black wrist brace RUE 08/29/20     OT SHORT TERM GOAL #3   Title Pt will verbalize understanding of adapted strategies and/or activity modifications for increasing independence with ADLs and decreasing pain.   (cutting up food, zippers, hooking bra, etc)    Time 3    Period Weeks    Status Achieved   pt reports getting easier     OT SHORT TERM GOAL #4   Title Pt will verbalize understanding of pain management strategies.    Time 3    Period Weeks    Status  Achieved             OT Long Term Goals - 09/19/20 1752      OT LONG TERM GOAL #1   Title Pt will be independent with any updated HEPs 10/05/20    Time 6    Period Weeks    Status On-going      OT LONG TERM GOAL #2   Title Pt will increase grip strength in BUE to 40 lbs or greater in order to increase functional use of BUE.    Baseline RUE 38.3, LUE 33.7    Time 6    Period Weeks    Status Partially Met   R - 41.6lbs  L - 39.6lbs - partially met with R     OT LONG TERM GOAL #3   Title Pt will increase fine motor coordination by decreasing 9 hole peg test time by 3 seconds with BUE.    Baseline RUE 31.63s LUE 33.47s    Time 6    Period Weeks    Status On-going      OT LONG TERM GOAL #4   Title Pt will verbalize understanding of sleep positions for decreasing pain/numbness in BUE    Time 6    Period Weeks    Status Achieved   pt reports it getting better     OT LONG TERM GOAL #5   Title Pt will demonstrate composite flexion of 100% in BUE.    Baseline 80-90%    Time 6    Period Weeks    Status On-going                 Plan - 10/03/20 1809    Clinical Impression Statement Pt progressing towards goals.    OT Occupational Profile and History Problem Focused Assessment - Including review of records relating to presenting problem    Occupational performance deficits (Please refer to evaluation for details): ADL's;IADL's    Body Structure / Function / Physical Skills ADL;Body mechanics;Coordination;IADL;Pain;Sensation;GMC;ROM;UE functional use;Flexibility;Decreased knowledge of use of DME;FMC;Dexterity;Strength    Rehab Potential Good    Clinical Decision Making Limited treatment options, no task modification necessary    Comorbidities Affecting Occupational Performance: None    Modification or Assistance to Complete Evaluation  No modification  of tasks or assist necessary to complete eval    OT Frequency 2x / week    OT Duration 6 weeks   12 visits   OT  Treatment/Interventions Self-care/ADL training;Cryotherapy;Moist Heat;Ultrasound;Fluidtherapy;Paraffin;Therapeutic exercise;DME and/or AE instruction;Passive range of motion;Therapeutic activities;Patient/family education;Splinting    Plan Ultrasound, discharge next session vs renewal    OT Home Exercise Plan tendon glides, AROM wrist, added weight, theraputty    Consulted and Agree with Plan of Care Patient           Patient will benefit from skilled therapeutic intervention in order to improve the following deficits and impairments:   Body Structure / Function / Physical Skills: ADL,Body mechanics,Coordination,IADL,Pain,Sensation,GMC,ROM,UE functional use,Flexibility,Decreased knowledge of use of DME,FMC,Dexterity,Strength       Visit Diagnosis: Pain in right hand  Pain in left hand  Stiffness of left hand, not elsewhere classified  Carpal tunnel syndrome, bilateral upper limbs  Stiffness of right hand, not elsewhere classified  Other lack of coordination  Muscle weakness (generalized)    Problem List Patient Active Problem List   Diagnosis Date Noted  . Severe sepsis ([place]) 04/25/2020  . Sepsis ([place]) 04/19/2020  . Neuropathic pain 03/01/2020  . S/P TKR (total knee replacement) using cement, left 12/14/2019  . Type II diabetes mellitus, uncontrolled ([place]) 11/15/2019  . Medication management 03/23/2019  . Chronic pain of both knees 01/26/2019  . Trochanteric bursitis, right hip 12/15/2018  . Lumbar radiculopathy 11/06/2018  . Acute right-sided low back pain 10/06/2018  . COPD with acute exacerbation ([place]) 04/20/2018  . Anxiety and depression 04/20/2018  . Pain of left shoulder joint on movement 04/13/2017  . Chronic bronchitis ([place]) 03/29/2017  . Gouty arthropathy 03/02/2017  . Hyperammonemia ([place]) 03/02/2017  . Hypomagnesemia 03/02/2017  . CAP (community acquired pneumonia) 02/18/2017  . Liver cirrhosis ([place]) 02/18/2017  . Leukocytosis 02/18/2017  . Sinus  tachycardia 02/18/2017  . Tachypnea 02/18/2017  . RLL pneumonia 02/18/2017  . Increased ammonia level 02/18/2017  . Asthma 11/25/2016  . Idiopathic progressive polyneuropathy 11/25/2016  . Peripheral vascular disease ([place]) 11/25/2016  . Obesity 11/25/2016  . Retained metal fragment left scapula 12/26/2015  . Fracture of glenoid process of left scapula 12/29/2014  . Status post total shoulder arthroplasty 12/27/2014  . Cocaine dependence in remission ([place]) 09/29/2014  . Severe heroin dependence in sustained remission ([place]) 09/29/2014  . Opiate abuse, episodic ([place]) 09/29/2014  . Mild tetrahydrocannabinol (THC) abuse 09/29/2014  . Chronic pain syndrome 04/18/2014  . Primary osteoarthritis of left shoulder 04/18/2014  . Primary osteoarthritis of right knee 04/18/2014  . Primary osteoarthritis of left knee 04/18/2014  . HYPERTENSION, BENIGN ESSENTIAL 03/22/2010  . CONSTIPATION 03/22/2010  . HEPATITIS C 06/04/1995    Zachery Conch MOT, OTR/L  10/03/2020, 6:28 PM  [facility] [address] [place], [place], [address] Phone: [phone]   Fax:  [phone]  Name: Ashley Riddle MRN: [id number] Date of Birth: 11-Nov-1945

## 2020-10-05 ENCOUNTER — Other Ambulatory Visit: Payer: Self-pay

## 2020-10-05 ENCOUNTER — Ambulatory Visit: Payer: Medicare Other | Admitting: Occupational Therapy

## 2020-10-05 ENCOUNTER — Encounter: Payer: Self-pay | Admitting: Occupational Therapy

## 2020-10-05 DIAGNOSIS — M6281 Muscle weakness (generalized): Secondary | ICD-10-CM | POA: Diagnosis not present

## 2020-10-05 DIAGNOSIS — M79642 Pain in left hand: Secondary | ICD-10-CM

## 2020-10-05 DIAGNOSIS — R278 Other lack of coordination: Secondary | ICD-10-CM

## 2020-10-05 DIAGNOSIS — M25642 Stiffness of left hand, not elsewhere classified: Secondary | ICD-10-CM | POA: Diagnosis not present

## 2020-10-05 DIAGNOSIS — M79641 Pain in right hand: Secondary | ICD-10-CM | POA: Diagnosis not present

## 2020-10-05 DIAGNOSIS — G5603 Carpal tunnel syndrome, bilateral upper limbs: Secondary | ICD-10-CM

## 2020-10-05 DIAGNOSIS — M25641 Stiffness of right hand, not elsewhere classified: Secondary | ICD-10-CM

## 2020-10-05 NOTE — Therapy (Signed)
[facility] [address] [place], [place], [address] Phone: [phone]   Fax:  [phone]  Occupational Therapy Treatment & Discharge  Patient Details  Name: Ashley Riddle MRN: [id number] Date of Birth: Aug 02, 1945 Referring Provider (OT): Latanya Presser MD   Encounter Date: 10/05/2020   OT End of Session - 10/05/20 1750    Visit Number 11    Number of Visits 13    Date for OT Re-Evaluation 10/05/20    Authorization Type UHC Medicare  Medicaid Secondary    Authorization Time Period VL:MN 100% covered    OT Start Time 1750    OT Stop Time 1822   d/c session   OT Time Calculation (min) 32 min    Activity Tolerance Patient tolerated treatment well    Behavior During Therapy [facility] for tasks assessed/performed           Past Medical History:  Diagnosis Date  . Anxiety   . Arthritis   . Constipation   . COPD (chronic obstructive pulmonary disease) ([place])   . DDD (degenerative disc disease), cervical   . DDD (degenerative disc disease), lumbar   . Gout   . Heart murmur    probable bicuspid AV with mild AS, mild MR by 04/22/14 Echo (Dr. Montez Morita)  . Hepatitis C    treated 2016   . History of blood transfusion   . Hypertension   . Pneumonia 12/02/13    Past Surgical History:  Procedure Laterality Date  . ABDOMINAL HYSTERECTOMY    . APPENDECTOMY    . BACK SURGERY    . CESAREAN SECTION     x 3  . COLONOSCOPY W/ POLYPECTOMY    . HARDWARE REMOVAL Left 12/26/2015   Procedure: REMOVAL K-WIRE LEFT SCAPULA;  Surgeon: Garald Balding, MD;  Location: [facility];  Service: Orthopedics;  Laterality: Left;  . INNER EAR SURGERY     blood vessel  . TONSILLECTOMY    . TOTAL SHOULDER ARTHROPLASTY Left 12/27/2014   Procedure: TOTAL SHOULDER ARTHROPLASTY;  Surgeon: Garald Balding, MD;  Location: [facility];  Service: Orthopedics;  Laterality: Left;    There were no vitals filed for this visit.    OCCUPATIONAL THERAPY  DISCHARGE SUMMARY  Visits from Start of Care: 12  Current functional level related to goals / functional outcomes: Pt improved with overall education and knowledge of condition. Pt improved grip strength, bilaterally.    Remaining deficits: Coordination, pain, sensation deficits   Education / Equipment: HEPs, theraputty, braces/splints bilaterally.  Plan: Patient agrees to discharge.  Patient goals were partially met. Patient is being discharged due to being pleased with the current functional level.  ?????          Reviewed goals.  Ultrasound 25mz, 0.8w/cm2, 8 minutes, Continuous - each side - wrist. For pain relief.                      OT Short Term Goals - 10/05/20 1751      OT SHORT TERM GOAL #1   Title Pt will be independent with HEP 414/2022    Time 3    Period Weeks    Status Achieved    Target Date 09/14/20      OT SHORT TERM GOAL #2   Title Pt will be compliant and verbalize understanding of splint and/or brace wear and care instructions PRN    Time 3    Period Weeks    Status Achieved  issued black wrist brace RUE 08/29/20     OT SHORT TERM GOAL #3   Title Pt will verbalize understanding of adapted strategies and/or activity modifications for increasing independence with ADLs and decreasing pain.   (cutting up food, zippers, hooking bra, etc)    Time 3    Period Weeks    Status Achieved   pt reports getting easier     OT SHORT TERM GOAL #4   Title Pt will verbalize understanding of pain management strategies.    Time 3    Period Weeks    Status Achieved             OT Long Term Goals - 10/05/20 1751      OT LONG TERM GOAL #1   Title Pt will be independent with any updated HEPs 10/05/20    Time 6    Period Weeks    Status Achieved      OT LONG TERM GOAL #2   Title Pt will increase grip strength in BUE to 40 lbs or greater in order to increase functional use of BUE.    Baseline RUE 38.3, LUE 33.7    Time 6    Period Weeks     Status Achieved   R - 41.6lbs  L - 48.9 lbs     OT LONG TERM GOAL #3   Title Pt will increase fine motor coordination by decreasing 9 hole peg test time by 3 seconds with BUE.    Baseline RUE 31.63s LUE 33.47s    Time 6    Period Weeks    Status Not Met   R - 37.81s L- 33.59s     OT LONG TERM GOAL #4   Title Pt will verbalize understanding of sleep positions for decreasing pain/numbness in BUE    Time 6    Period Weeks    Status Achieved   pt reports it getting better     OT LONG TERM GOAL #5   Title Pt will demonstrate composite flexion of 100% in BUE.    Baseline 80-90%    Time 6    Period Weeks    Status Partially Met   100% with LUE and approx 90% with RUE                Plan - 10/05/20 1827    Clinical Impression Statement Pt has progressed towards goals and is ready for discharge at this time. Pt is agreeable to discharge. Pt had slow progress with grip strength and was provided education re: condition and adapted strategies.    OT Occupational Profile and History Problem Focused Assessment - Including review of records relating to presenting problem    Occupational performance deficits (Please refer to evaluation for details): ADL's;IADL's    Body Structure / Function / Physical Skills ADL;Body mechanics;Coordination;IADL;Pain;Sensation;GMC;ROM;UE functional use;Flexibility;Decreased knowledge of use of DME;FMC;Dexterity;Strength    Rehab Potential Good    Clinical Decision Making Limited treatment options, no task modification necessary    Comorbidities Affecting Occupational Performance: None    Modification or Assistance to Complete Evaluation  No modification of tasks or assist necessary to complete eval    OT Frequency 2x / week    OT Duration 6 weeks   12 visits   OT Treatment/Interventions Self-care/ADL training;Cryotherapy;Moist Heat;Ultrasound;Fluidtherapy;Paraffin;Therapeutic exercise;DME and/or AE instruction;Passive range of motion;Therapeutic  activities;Patient/family education;Splinting    Plan d/c    OT Home Exercise Plan tendon glides, AROM wrist, added weight, theraputty    Consulted  and Agree with Plan of Care Patient           Patient will benefit from skilled therapeutic intervention in order to improve the following deficits and impairments:   Body Structure / Function / Physical Skills: ADL,Body mechanics,Coordination,IADL,Pain,Sensation,GMC,ROM,UE functional use,Flexibility,Decreased knowledge of use of DME,FMC,Dexterity,Strength       Visit Diagnosis: Pain in right hand  Pain in left hand  Stiffness of left hand, not elsewhere classified  Carpal tunnel syndrome, bilateral upper limbs  Stiffness of right hand, not elsewhere classified  Other lack of coordination  Muscle weakness (generalized)    Problem List Patient Active Problem List   Diagnosis Date Noted  . Severe sepsis ([place]) 04/25/2020  . Sepsis ([place]) 04/19/2020  . Neuropathic pain 03/01/2020  . S/P TKR (total knee replacement) using cement, left 12/14/2019  . Type II diabetes mellitus, uncontrolled ([place]) 11/15/2019  . Medication management 03/23/2019  . Chronic pain of both knees 01/26/2019  . Trochanteric bursitis, right hip 12/15/2018  . Lumbar radiculopathy 11/06/2018  . Acute right-sided low back pain 10/06/2018  . COPD with acute exacerbation ([place]) 04/20/2018  . Anxiety and depression 04/20/2018  . Pain of left shoulder joint on movement 04/13/2017  . Chronic bronchitis ([place]) 03/29/2017  . Gouty arthropathy 03/02/2017  . Hyperammonemia ([place]) 03/02/2017  . Hypomagnesemia 03/02/2017  . CAP (community acquired pneumonia) 02/18/2017  . Liver cirrhosis ([place]) 02/18/2017  . Leukocytosis 02/18/2017  . Sinus tachycardia 02/18/2017  . Tachypnea 02/18/2017  . RLL pneumonia 02/18/2017  . Increased ammonia level 02/18/2017  . Asthma 11/25/2016  . Idiopathic progressive polyneuropathy 11/25/2016  . Peripheral vascular disease ([place])  11/25/2016  . Obesity 11/25/2016  . Retained metal fragment left scapula 12/26/2015  . Fracture of glenoid process of left scapula 12/29/2014  . Status post total shoulder arthroplasty 12/27/2014  . Cocaine dependence in remission ([place]) 09/29/2014  . Severe heroin dependence in sustained remission ([place]) 09/29/2014  . Opiate abuse, episodic ([place]) 09/29/2014  . Mild tetrahydrocannabinol (THC) abuse 09/29/2014  . Chronic pain syndrome 04/18/2014  . Primary osteoarthritis of left shoulder 04/18/2014  . Primary osteoarthritis of right knee 04/18/2014  . Primary osteoarthritis of left knee 04/18/2014  . HYPERTENSION, BENIGN ESSENTIAL 03/22/2010  . CONSTIPATION 03/22/2010  . HEPATITIS C 06/04/1995    Zachery Conch MOT, OTR/L  10/05/2020, 6:28 PM  [facility] [address] [place], [place], [address] Phone: [phone]   Fax:  [phone]  Name: Ashley Riddle MRN: [id number] Date of Birth: [birth year]

## 2020-11-10 DIAGNOSIS — D6489 Other specified anemias: Secondary | ICD-10-CM | POA: Diagnosis not present

## 2020-11-10 DIAGNOSIS — G5603 Carpal tunnel syndrome, bilateral upper limbs: Secondary | ICD-10-CM | POA: Diagnosis not present

## 2020-11-10 DIAGNOSIS — K7460 Unspecified cirrhosis of liver: Secondary | ICD-10-CM | POA: Diagnosis not present

## 2020-11-10 DIAGNOSIS — Z13820 Encounter for screening for osteoporosis: Secondary | ICD-10-CM | POA: Diagnosis not present

## 2020-11-10 DIAGNOSIS — Z0001 Encounter for general adult medical examination with abnormal findings: Secondary | ICD-10-CM | POA: Diagnosis not present

## 2020-11-10 DIAGNOSIS — Z1231 Encounter for screening mammogram for malignant neoplasm of breast: Secondary | ICD-10-CM | POA: Diagnosis not present

## 2020-11-10 DIAGNOSIS — E7229 Other disorders of urea cycle metabolism: Secondary | ICD-10-CM | POA: Diagnosis not present

## 2020-11-10 DIAGNOSIS — I10 Essential (primary) hypertension: Secondary | ICD-10-CM | POA: Diagnosis not present

## 2020-11-10 DIAGNOSIS — Z1211 Encounter for screening for malignant neoplasm of colon: Secondary | ICD-10-CM | POA: Diagnosis not present

## 2020-11-10 DIAGNOSIS — E1165 Type 2 diabetes mellitus with hyperglycemia: Secondary | ICD-10-CM | POA: Diagnosis not present

## 2020-11-10 DIAGNOSIS — E1140 Type 2 diabetes mellitus with diabetic neuropathy, unspecified: Secondary | ICD-10-CM | POA: Diagnosis not present

## 2020-11-10 DIAGNOSIS — M1711 Unilateral primary osteoarthritis, right knee: Secondary | ICD-10-CM | POA: Diagnosis not present

## 2020-11-10 DIAGNOSIS — Z23 Encounter for immunization: Secondary | ICD-10-CM | POA: Diagnosis not present

## 2020-11-16 DIAGNOSIS — I739 Peripheral vascular disease, unspecified: Secondary | ICD-10-CM | POA: Diagnosis not present

## 2020-11-17 DIAGNOSIS — M19042 Primary osteoarthritis, left hand: Secondary | ICD-10-CM | POA: Diagnosis not present

## 2020-11-17 DIAGNOSIS — G5600 Carpal tunnel syndrome, unspecified upper limb: Secondary | ICD-10-CM | POA: Insufficient documentation

## 2020-11-17 DIAGNOSIS — G5601 Carpal tunnel syndrome, right upper limb: Secondary | ICD-10-CM | POA: Diagnosis not present

## 2020-11-17 DIAGNOSIS — G5603 Carpal tunnel syndrome, bilateral upper limbs: Secondary | ICD-10-CM | POA: Diagnosis not present

## 2020-11-30 DIAGNOSIS — M792 Neuralgia and neuritis, unspecified: Secondary | ICD-10-CM | POA: Diagnosis not present

## 2020-11-30 DIAGNOSIS — M25562 Pain in left knee: Secondary | ICD-10-CM | POA: Diagnosis not present

## 2020-11-30 DIAGNOSIS — M25561 Pain in right knee: Secondary | ICD-10-CM | POA: Diagnosis not present

## 2020-11-30 DIAGNOSIS — G894 Chronic pain syndrome: Secondary | ICD-10-CM | POA: Diagnosis not present

## 2020-12-05 DIAGNOSIS — M1711 Unilateral primary osteoarthritis, right knee: Secondary | ICD-10-CM | POA: Diagnosis not present

## 2020-12-06 ENCOUNTER — Ambulatory Visit: Payer: Medicare Other | Admitting: Podiatry

## 2020-12-20 DIAGNOSIS — R051 Acute cough: Secondary | ICD-10-CM | POA: Diagnosis not present

## 2020-12-20 DIAGNOSIS — M19049 Primary osteoarthritis, unspecified hand: Secondary | ICD-10-CM | POA: Diagnosis not present

## 2020-12-20 DIAGNOSIS — J441 Chronic obstructive pulmonary disease with (acute) exacerbation: Secondary | ICD-10-CM | POA: Diagnosis not present

## 2020-12-22 DIAGNOSIS — G5602 Carpal tunnel syndrome, left upper limb: Secondary | ICD-10-CM | POA: Diagnosis not present

## 2020-12-22 DIAGNOSIS — G5603 Carpal tunnel syndrome, bilateral upper limbs: Secondary | ICD-10-CM | POA: Diagnosis not present

## 2020-12-22 DIAGNOSIS — M79641 Pain in right hand: Secondary | ICD-10-CM | POA: Diagnosis not present

## 2020-12-22 DIAGNOSIS — M654 Radial styloid tenosynovitis [de Quervain]: Secondary | ICD-10-CM | POA: Diagnosis not present

## 2020-12-22 DIAGNOSIS — M79642 Pain in left hand: Secondary | ICD-10-CM | POA: Diagnosis not present

## 2021-01-09 ENCOUNTER — Ambulatory Visit: Payer: Medicare Other | Admitting: Diagnostic Neuroimaging

## 2021-01-17 ENCOUNTER — Ambulatory Visit (INDEPENDENT_AMBULATORY_CARE_PROVIDER_SITE_OTHER): Payer: Medicare Other | Admitting: Podiatry

## 2021-01-17 ENCOUNTER — Encounter: Payer: Self-pay | Admitting: Podiatry

## 2021-01-17 ENCOUNTER — Other Ambulatory Visit: Payer: Self-pay

## 2021-01-17 DIAGNOSIS — M79674 Pain in right toe(s): Secondary | ICD-10-CM

## 2021-01-17 DIAGNOSIS — B351 Tinea unguium: Secondary | ICD-10-CM | POA: Diagnosis not present

## 2021-01-17 DIAGNOSIS — M79675 Pain in left toe(s): Secondary | ICD-10-CM

## 2021-01-18 DIAGNOSIS — G5602 Carpal tunnel syndrome, left upper limb: Secondary | ICD-10-CM | POA: Diagnosis not present

## 2021-01-18 DIAGNOSIS — G5601 Carpal tunnel syndrome, right upper limb: Secondary | ICD-10-CM | POA: Diagnosis not present

## 2021-01-18 DIAGNOSIS — G5603 Carpal tunnel syndrome, bilateral upper limbs: Secondary | ICD-10-CM | POA: Diagnosis not present

## 2021-01-21 NOTE — Progress Notes (Signed)
Subjective: Ashley Riddle is a pleasant 75 y.o. female patient seen today for preventative diabetic foot care painful thick toenails that are difficult to trim. Pain interferes with ambulation. Aggravating factors include wearing enclosed shoe gear. Pain is relieved with periodic professional debridement.  She states she attended her granddaughter's wedding a while back. She states her heels are a little dry.  PCP is Audley Hose, MD. Last visit was: 12/20/2020.  Allergies  Allergen Reactions   Gabapentin Other (See Comments)   Pregabalin     Other reaction(s): Confusion   Aspirin Other (See Comments)    Due to history of hepatitis   Penicillins Hives    Has patient had a PCN reaction causing immediate rash, facial/tongue/throat swelling, SOB or lightheadedness with hypotension: Yes Has patient had a PCN reaction causing severe rash involving mucus membranes or skin necrosis: No Has patient had a PCN reaction that required hospitalization No Has patient had a PCN reaction occurring within the last 10 years: No If all of the above answers are "NO", then may proceed with Cephalosporin use.     Objective: Physical Exam  General: Ashley Riddle is a pleasant 75 y.o. African American female, obese in NAD. AAO x 3.   Vascular:  Capillary fill time to digits <3 seconds b/l lower extremities. Palpable DP pulse(s) b/l lower extremities Palpable PT pulse(s) b/l lower extremities Pedal hair sparse. Lower extremity skin temperature gradient within normal limits. No pain with calf compression b/l. No edema noted b/l lower extremities.  Dermatological:  Toenails R hallux well maintained with adequate length. No erythema, no edema, no drainage, no fluctuance. Toenails L hallux, L 2nd toe, L 3rd toe, L 4th toe, R 2nd toe, R 3rd toe, and R 4th toe elongated, discolored, dystrophic, thickened, and crumbly with subungual debris and tenderness to dorsal palpation. Anonychia noted L 5th  toe and R 5th toe. Nailbed(s) epithelialized.  Mild dry skin b/l heels. No cracks. No signs of infection.  Musculoskeletal:  Normal muscle strength 5/5 to all lower extremity muscle groups bilaterally. Hallux valgus with bunion deformity noted b/l lower extremities.  Neurological:  Protective sensation intact 5/5 intact bilaterally with 10g monofilament b/l. Vibratory sensation intact b/l.  Assessment and Plan:  1. Pain due to onychomycosis of toenails of both feet      -Examined patient. -Dispensed sample of Foot Miracle Cream for dry skin. -Patient to continue soft, supportive shoe gear daily. -Toenails L hallux, L 2nd toe, L 3rd toe, L 4th toe, R 2nd toe, R 3rd toe, and R 4th toe debrided in length and girth without iatrogenic bleeding with sterile nail nipper and dremel.  -Patient to report any pedal injuries to medical professional immediately. -Patient/POA to call should there be question/concern in the interim.  Return in about 3 months (around 04/19/2021).  Marzetta Board, DPM

## 2021-01-29 DIAGNOSIS — M1711 Unilateral primary osteoarthritis, right knee: Secondary | ICD-10-CM | POA: Diagnosis not present

## 2021-02-08 ENCOUNTER — Other Ambulatory Visit: Payer: Self-pay | Admitting: Internal Medicine

## 2021-02-08 DIAGNOSIS — Z1231 Encounter for screening mammogram for malignant neoplasm of breast: Secondary | ICD-10-CM

## 2021-02-15 DIAGNOSIS — G5601 Carpal tunnel syndrome, right upper limb: Secondary | ICD-10-CM | POA: Diagnosis not present

## 2021-02-15 DIAGNOSIS — G5603 Carpal tunnel syndrome, bilateral upper limbs: Secondary | ICD-10-CM | POA: Diagnosis not present

## 2021-02-15 DIAGNOSIS — G5602 Carpal tunnel syndrome, left upper limb: Secondary | ICD-10-CM | POA: Diagnosis not present

## 2021-02-23 DIAGNOSIS — G5603 Carpal tunnel syndrome, bilateral upper limbs: Secondary | ICD-10-CM | POA: Diagnosis not present

## 2021-02-23 DIAGNOSIS — R413 Other amnesia: Secondary | ICD-10-CM | POA: Diagnosis not present

## 2021-02-23 DIAGNOSIS — D6489 Other specified anemias: Secondary | ICD-10-CM | POA: Diagnosis not present

## 2021-02-23 DIAGNOSIS — E871 Hypo-osmolality and hyponatremia: Secondary | ICD-10-CM | POA: Diagnosis not present

## 2021-02-23 DIAGNOSIS — M109 Gout, unspecified: Secondary | ICD-10-CM | POA: Diagnosis not present

## 2021-02-23 DIAGNOSIS — I10 Essential (primary) hypertension: Secondary | ICD-10-CM | POA: Diagnosis not present

## 2021-02-23 DIAGNOSIS — J410 Simple chronic bronchitis: Secondary | ICD-10-CM | POA: Diagnosis not present

## 2021-02-23 DIAGNOSIS — E1165 Type 2 diabetes mellitus with hyperglycemia: Secondary | ICD-10-CM | POA: Diagnosis not present

## 2021-02-23 DIAGNOSIS — D509 Iron deficiency anemia, unspecified: Secondary | ICD-10-CM | POA: Diagnosis not present

## 2021-02-23 DIAGNOSIS — G603 Idiopathic progressive neuropathy: Secondary | ICD-10-CM | POA: Diagnosis not present

## 2021-02-23 DIAGNOSIS — I700 Atherosclerosis of aorta: Secondary | ICD-10-CM | POA: Diagnosis not present

## 2021-03-01 DIAGNOSIS — G894 Chronic pain syndrome: Secondary | ICD-10-CM | POA: Diagnosis not present

## 2021-03-01 DIAGNOSIS — Z79899 Other long term (current) drug therapy: Secondary | ICD-10-CM | POA: Diagnosis not present

## 2021-03-01 DIAGNOSIS — M25561 Pain in right knee: Secondary | ICD-10-CM | POA: Diagnosis not present

## 2021-03-01 DIAGNOSIS — M792 Neuralgia and neuritis, unspecified: Secondary | ICD-10-CM | POA: Diagnosis not present

## 2021-03-01 DIAGNOSIS — M25562 Pain in left knee: Secondary | ICD-10-CM | POA: Diagnosis not present

## 2021-03-07 ENCOUNTER — Other Ambulatory Visit: Payer: Self-pay | Admitting: Internal Medicine

## 2021-03-07 DIAGNOSIS — Z13820 Encounter for screening for osteoporosis: Secondary | ICD-10-CM

## 2021-03-13 ENCOUNTER — Other Ambulatory Visit: Payer: Self-pay

## 2021-03-13 ENCOUNTER — Ambulatory Visit
Admission: RE | Admit: 2021-03-13 | Discharge: 2021-03-13 | Disposition: A | Discharge status: Home / Self Care | Payer: Medicare Other | Source: Ambulatory Visit | Attending: Internal Medicine | Admitting: Internal Medicine

## 2021-03-13 DIAGNOSIS — Z1231 Encounter for screening mammogram for malignant neoplasm of breast: Secondary | ICD-10-CM | POA: Diagnosis not present

## 2021-03-15 DIAGNOSIS — K7469 Other cirrhosis of liver: Secondary | ICD-10-CM | POA: Diagnosis not present

## 2021-03-16 ENCOUNTER — Other Ambulatory Visit: Payer: Self-pay | Admitting: Nurse Practitioner

## 2021-03-16 DIAGNOSIS — K7469 Other cirrhosis of liver: Secondary | ICD-10-CM

## 2021-03-20 DIAGNOSIS — G894 Chronic pain syndrome: Secondary | ICD-10-CM | POA: Diagnosis not present

## 2021-03-20 DIAGNOSIS — M25561 Pain in right knee: Secondary | ICD-10-CM | POA: Diagnosis not present

## 2021-03-20 DIAGNOSIS — M792 Neuralgia and neuritis, unspecified: Secondary | ICD-10-CM | POA: Diagnosis not present

## 2021-03-20 DIAGNOSIS — Z79899 Other long term (current) drug therapy: Secondary | ICD-10-CM | POA: Diagnosis not present

## 2021-03-20 DIAGNOSIS — G8929 Other chronic pain: Secondary | ICD-10-CM | POA: Diagnosis not present

## 2021-03-20 DIAGNOSIS — M25562 Pain in left knee: Secondary | ICD-10-CM | POA: Diagnosis not present

## 2021-03-23 DIAGNOSIS — Z23 Encounter for immunization: Secondary | ICD-10-CM | POA: Diagnosis not present

## 2021-03-29 DIAGNOSIS — G5603 Carpal tunnel syndrome, bilateral upper limbs: Secondary | ICD-10-CM | POA: Diagnosis not present

## 2021-04-02 ENCOUNTER — Other Ambulatory Visit: Payer: Self-pay

## 2021-04-02 ENCOUNTER — Ambulatory Visit
Admission: RE | Admit: 2021-04-02 | Discharge: 2021-04-02 | Disposition: A | Discharge status: Home / Self Care | Payer: Medicare Other | Source: Ambulatory Visit | Attending: Nurse Practitioner | Admitting: Nurse Practitioner

## 2021-04-02 DIAGNOSIS — K7469 Other cirrhosis of liver: Secondary | ICD-10-CM

## 2021-04-02 DIAGNOSIS — K7460 Unspecified cirrhosis of liver: Secondary | ICD-10-CM | POA: Diagnosis not present

## 2021-04-18 ENCOUNTER — Telehealth: Payer: Self-pay | Admitting: *Deleted

## 2021-04-18 NOTE — Telephone Encounter (Signed)
Patient is requesting a sooner appointment , having neuropathy pain, feet giving her problems, may need injections. Please schedule.

## 2021-04-19 ENCOUNTER — Telehealth: Payer: Self-pay | Admitting: *Deleted

## 2021-04-24 NOTE — Telephone Encounter (Signed)
Error message

## 2021-04-25 ENCOUNTER — Other Ambulatory Visit: Payer: Self-pay

## 2021-04-25 ENCOUNTER — Ambulatory Visit (INDEPENDENT_AMBULATORY_CARE_PROVIDER_SITE_OTHER): Payer: Medicare Other | Admitting: Podiatry

## 2021-04-25 ENCOUNTER — Ambulatory Visit: Payer: Medicare Other | Admitting: Podiatry

## 2021-04-25 DIAGNOSIS — M778 Other enthesopathies, not elsewhere classified: Secondary | ICD-10-CM | POA: Diagnosis not present

## 2021-04-25 DIAGNOSIS — M19079 Primary osteoarthritis, unspecified ankle and foot: Secondary | ICD-10-CM

## 2021-04-25 NOTE — Progress Notes (Signed)
Subjective:  Patient ID: Ashley Riddle, female    DOB: 1945-08-21,  MRN: [id number]  Chief Complaint  Patient presents with   Nail Problem    Nail trim     75 y.o. female presents with the above complaint.  Patient presents with bilateral dorsal midfoot pain.  Patient states is painful to touch.  Painful to walk on.  She states that has been going on for quite some time.  She has not seen anyone else prior to see me.  She has not received any injections in the past.  She also has neuropathy likely coming from the back.  She denies any other acute complaints.  She would like to discuss treatment options for this.   Review of Systems: Negative except as noted in the HPI. Denies N/V/F/Ch.  Past Medical History:  Diagnosis Date   Anxiety    Arthritis    Constipation    COPD (chronic obstructive pulmonary disease) (HCC)    DDD (degenerative disc disease), cervical    DDD (degenerative disc disease), lumbar    Gout    Heart murmur    probable bicuspid AV with mild AS, mild MR by 04/22/14 Echo (Dr. Montez Morita)   Hepatitis C    treated 2016    History of blood transfusion    Hypertension    Pneumonia 12/02/13    Current Outpatient Medications:    ACCU-CHEK GUIDE test strip, , Disp: , Rfl:    acetaminophen (TYLENOL) 500 MG tablet, Take 1,000 mg by mouth every 8 (eight) hours as needed for moderate pain. , Disp: , Rfl:    albuterol (PROVENTIL HFA;VENTOLIN HFA) 108 (90 Base) MCG/ACT inhaler, Inhale 2 puffs into the lungs every 2 (two) hours as needed for wheezing or shortness of breath. , Disp: , Rfl:    azithromycin (ZITHROMAX) 250 MG tablet, azithromycin 250 mg tablet  TAKE 1 TABLET BY MOUTH EVERY EVENING X 2 DAYS, Disp: , Rfl:    BELBUCA 600 MCG FILM, Take 1 Film by mouth in the morning and at bedtime. , Disp: , Rfl:    budesonide (PULMICORT) 0.5 MG/2ML nebulizer solution, Take 2 mLs (0.5 mg total) by nebulization 2 (two) times daily. (Patient taking differently: Take 0.5 mg by  nebulization 2 (two) times daily as needed (wheezing). ), Disp: 120 mL, Rfl: 0   Buprenorphine HCl (BELBUCA) 750 MCG FILM, Belbuca 750 mcg buccal film, Disp: , Rfl:    Calcium Carbonate-Vit D-Min (CALCIUM 600+D PLUS MINERALS) 600-400 MG-UNIT TABS, Calcium 600 + D(3) 600 mg-10 mcg (400 unit) tablet  Take 1 tablet twice a day by oral route for 30 days., Disp: , Rfl:    Calcium Carbonate-Vitamin D 600-400 MG-UNIT tablet, Take 1 tablet by mouth daily. , Disp: , Rfl:    Calcium Carbonate-Vitamin D 600-400 MG-UNIT tablet, Calcium 600 + D(3) 600 mg-10 mcg (400 unit) tablet  Take 1 tablet twice a day by oral route for 30 days., Disp: , Rfl:    cefadroxil (DURICEF) 500 MG capsule, cefadroxil 500 mg capsule  TAKE 1 CAPSULE (500 MG TOTAL) BY MOUTH 2 TIMES DAILY FOR 7 DAYS., Disp: , Rfl:    cefdinir (OMNICEF) 300 MG capsule, cefdinir 300 mg capsule  TAKE 1 CAPSULE BY MOUTH EVERY 12 HOURS X 7DOSES, Disp: , Rfl:    clotrimazole-betamethasone (LOTRISONE) cream, APPLY TO AFFECTED AREA TWICE A DAY FOR 2 WEEKS, Disp: , Rfl:    colchicine 0.6 MG tablet, Take 0.6 mg by mouth daily as needed (gout flares). ,  Disp: , Rfl:    Cyanocobalamin (B-12) 5000 MCG SUBL, B12  take one tab per day, Disp: , Rfl:    Cyanocobalamin (VITAMIN B-12 PO), Take 1 tablet by mouth daily., Disp: , Rfl:    cyclobenzaprine (FLEXERIL) 5 MG tablet, Take 5 mg by mouth daily as needed for muscle spasms. , Disp: , Rfl:    diclofenac Sodium (VOLTAREN) 1 % GEL, diclofenac 1 % topical gel  APPLY 2 GRAMS TO THE AFFECTED AREA(S) BY TOPICAL ROUTE 4 TIMES PER DAY, Disp: , Rfl:    doxycycline (VIBRAMYCIN) 100 MG capsule, doxycycline hyclate 100 mg capsule  TAKE 1 CAPSULE BY MOUTH TWICE A DAY FOR 7 DAYS, Disp: , Rfl:    DULoxetine (CYMBALTA) 30 MG capsule, Take 1 capsule (30 mg total) by mouth daily. (Patient not taking: Reported on 04/19/2020), Disp: 30 capsule, Rfl: 1   JANUVIA 25 MG tablet, Take 25 mg by mouth every morning., Disp: , Rfl:     losartan-hydrochlorothiazide (HYZAAR) 100-25 MG tablet, Take 1 tablet by mouth at bedtime. , Disp: , Rfl: 3   metFORMIN (GLUMETZA) 500 MG (MOD) 24 hr tablet, Take 500 mg by mouth daily with breakfast. , Disp: , Rfl:    methylPREDNISolone (MEDROL DOSEPAK) 4 MG TBPK tablet, See admin instructions., Disp: , Rfl:    Multiple Vitamin (MULTIVITAMIN WITH MINERALS) TABS tablet, Take 1 tablet by mouth daily., Disp: , Rfl:    oxyCODONE-acetaminophen (PERCOCET) 10-325 MG tablet, Take 1 tablet by mouth every 4 (four) hours as needed for pain., Disp: , Rfl:    rosuvastatin (CRESTOR) 5 MG tablet, Take 5 mg by mouth daily., Disp: , Rfl:    sertraline (ZOLOFT) 50 MG tablet, Take 25 mg by mouth daily. , Disp: , Rfl:    tiotropium (SPIRIVA) 18 MCG inhalation capsule, Place 18 mcg into inhaler and inhale daily as needed (shortness of breath). , Disp: , Rfl:    tiZANidine (ZANAFLEX) 2 MG tablet, Take 1 tablet (2 mg total) by mouth every 6 (six) hours as needed for muscle spasms., Disp: 15 tablet, Rfl: 0   tiZANidine (ZANAFLEX) 4 MG tablet, tizanidine 4 mg tablet  TAKE 1 TABLET BY MOUTH EVERYDAY AT BEDTIME, Disp: , Rfl:    valsartan-hydrochlorothiazide (DIOVAN-HCT) 320-12.5 MG tablet, valsartan 320 mg-hydrochlorothiazide 12.5 mg tablet, Disp: , Rfl:   Social History   Tobacco Use  Smoking Status Former   Packs/day: 1.00   Years: 49.00   Pack years: 49.00   Types: Cigarettes   Quit date: 2007   Years since quitting: 15.9  Smokeless Tobacco Never  Tobacco Comments   quit 2007    Allergies  Allergen Reactions   Gabapentin Other (See Comments)   Pregabalin     Other reaction(s): Confusion   Aspirin Other (See Comments)    Due to history of hepatitis   Penicillins Hives    Has patient had a PCN reaction causing immediate rash, facial/tongue/throat swelling, SOB or lightheadedness with hypotension: Yes Has patient had a PCN reaction causing severe rash involving mucus membranes or skin necrosis: No Has  patient had a PCN reaction that required hospitalization No Has patient had a PCN reaction occurring within the last 10 years: No If all of the above answers are "NO", then may proceed with Cephalosporin use.    Objective:  There were no vitals filed for this visit. There is no height or weight on file to calculate BMI. Constitutional Well developed. Well nourished.  Vascular Dorsalis pedis pulses palpable bilaterally. Posterior  tibial pulses palpable bilaterally. Capillary refill normal to all digits.  No cyanosis or clubbing noted. Pedal hair growth normal.  Neurologic Normal speech. Oriented to person, place, and time. Epicritic sensation to light touch grossly present bilaterally.  Dermatologic Nails well groomed and normal in appearance. No open wounds. No skin lesions.  Orthopedic: Pain on palpation dorsal midfoot.  Pain at the second/third tarsometatarsal joint bilaterally.  No pain with extensor or flexor tendinitis.  No Lisfranc joint pain.  Clinically I am able to appreciate arthritic spurring on the midfoot   Radiographs: None Assessment:   1. Arthritis of midfoot   2. Capsulitis of foot, right   3. Capsulitis of foot, left    Plan:  Patient was evaluated and treated and all questions answered.  Bilateral midfoot arthritis with underlying capsulitis -I explained to the patient the etiology of capsulitis and various treatment options were extensively discussed.  Given the amount of pain that she is having I believe she will benefit from a steroid injection to help decrease acute inflammatory component associate with pain and arthritis -A steroid injection was performed at bilateral dorsal midfoot using 1% plain Lidocaine and 10 mg of Kenalog. This was well tolerated. -I discussed shoe gear modification with her in extensive details -Padding was given to take the pressure off of the dorsal midfoot.   No follow-ups on file.

## 2021-05-30 DIAGNOSIS — M25561 Pain in right knee: Secondary | ICD-10-CM | POA: Diagnosis not present

## 2021-05-30 DIAGNOSIS — M25562 Pain in left knee: Secondary | ICD-10-CM | POA: Diagnosis not present

## 2021-05-30 DIAGNOSIS — G894 Chronic pain syndrome: Secondary | ICD-10-CM | POA: Diagnosis not present

## 2021-06-26 DIAGNOSIS — E785 Hyperlipidemia, unspecified: Secondary | ICD-10-CM | POA: Diagnosis not present

## 2021-06-26 DIAGNOSIS — J410 Simple chronic bronchitis: Secondary | ICD-10-CM | POA: Diagnosis not present

## 2021-06-26 DIAGNOSIS — E1165 Type 2 diabetes mellitus with hyperglycemia: Secondary | ICD-10-CM | POA: Diagnosis not present

## 2021-06-26 DIAGNOSIS — I10 Essential (primary) hypertension: Secondary | ICD-10-CM | POA: Diagnosis not present

## 2021-06-26 DIAGNOSIS — E1142 Type 2 diabetes mellitus with diabetic polyneuropathy: Secondary | ICD-10-CM | POA: Diagnosis not present

## 2021-06-26 DIAGNOSIS — J441 Chronic obstructive pulmonary disease with (acute) exacerbation: Secondary | ICD-10-CM | POA: Diagnosis not present

## 2021-06-26 DIAGNOSIS — R011 Cardiac murmur, unspecified: Secondary | ICD-10-CM | POA: Diagnosis not present

## 2021-06-26 DIAGNOSIS — M1009 Idiopathic gout, multiple sites: Secondary | ICD-10-CM | POA: Diagnosis not present

## 2021-07-18 DIAGNOSIS — E785 Hyperlipidemia, unspecified: Secondary | ICD-10-CM | POA: Diagnosis not present

## 2021-07-18 DIAGNOSIS — R011 Cardiac murmur, unspecified: Secondary | ICD-10-CM | POA: Diagnosis not present

## 2021-07-18 DIAGNOSIS — E1165 Type 2 diabetes mellitus with hyperglycemia: Secondary | ICD-10-CM | POA: Diagnosis not present

## 2021-07-18 DIAGNOSIS — E1142 Type 2 diabetes mellitus with diabetic polyneuropathy: Secondary | ICD-10-CM | POA: Diagnosis not present

## 2021-07-20 ENCOUNTER — Other Ambulatory Visit (HOSPITAL_COMMUNITY): Payer: Self-pay | Admitting: Internal Medicine

## 2021-07-20 DIAGNOSIS — R011 Cardiac murmur, unspecified: Secondary | ICD-10-CM

## 2021-07-26 ENCOUNTER — Other Ambulatory Visit: Payer: Self-pay

## 2021-07-26 ENCOUNTER — Ambulatory Visit (HOSPITAL_COMMUNITY)
Admission: RE | Admit: 2021-07-26 | Discharge: 2021-07-26 | Disposition: A | Discharge status: Home / Self Care | Payer: Medicare Other | Source: Ambulatory Visit | Attending: Internal Medicine | Admitting: Internal Medicine

## 2021-07-26 DIAGNOSIS — I10 Essential (primary) hypertension: Secondary | ICD-10-CM | POA: Diagnosis not present

## 2021-07-26 DIAGNOSIS — E119 Type 2 diabetes mellitus without complications: Secondary | ICD-10-CM | POA: Insufficient documentation

## 2021-07-26 DIAGNOSIS — R011 Cardiac murmur, unspecified: Secondary | ICD-10-CM | POA: Diagnosis not present

## 2021-07-26 LAB — ECHOCARDIOGRAM COMPLETE
AR max vel: 1.24 cm2
AV Area VTI: 1.2 cm2
AV Area mean vel: 1.22 cm2
AV Mean grad: 15.3 mmHg
AV Peak grad: 27 mmHg
Ao pk vel: 2.6 m/s
Area-P 1/2: 3.65 cm2
S' Lateral: 2 cm

## 2021-07-26 NOTE — Progress Notes (Signed)
Echocardiogram 2D Echocardiogram has been performed.  Ashley Riddle 07/26/2021, 9:40 AM

## 2021-08-07 ENCOUNTER — Ambulatory Visit (INDEPENDENT_AMBULATORY_CARE_PROVIDER_SITE_OTHER): Payer: Medicare Other | Admitting: Podiatry

## 2021-08-07 ENCOUNTER — Encounter: Payer: Self-pay | Admitting: Podiatry

## 2021-08-07 ENCOUNTER — Other Ambulatory Visit: Payer: Self-pay

## 2021-08-07 DIAGNOSIS — M2012 Hallux valgus (acquired), left foot: Secondary | ICD-10-CM

## 2021-08-07 DIAGNOSIS — M79675 Pain in left toe(s): Secondary | ICD-10-CM

## 2021-08-07 DIAGNOSIS — M79674 Pain in right toe(s): Secondary | ICD-10-CM

## 2021-08-07 DIAGNOSIS — E119 Type 2 diabetes mellitus without complications: Secondary | ICD-10-CM

## 2021-08-07 DIAGNOSIS — M2011 Hallux valgus (acquired), right foot: Secondary | ICD-10-CM | POA: Diagnosis not present

## 2021-08-07 DIAGNOSIS — E1151 Type 2 diabetes mellitus with diabetic peripheral angiopathy without gangrene: Secondary | ICD-10-CM | POA: Diagnosis not present

## 2021-08-07 DIAGNOSIS — M792 Neuralgia and neuritis, unspecified: Secondary | ICD-10-CM

## 2021-08-07 DIAGNOSIS — B351 Tinea unguium: Secondary | ICD-10-CM

## 2021-08-13 NOTE — Progress Notes (Signed)
ANNUAL DIABETIC FOOT EXAM  Subjective: Ashley Riddle presents today for annual diabetic foot examination.  Patient relates 2 year h/o diabetes.  Patient denies any h/o foot wounds.  Patient has been diagnosed with neuropathy and it is managed with duloxetine.  Patient's blood sugar was 125 mg/dl today. Last A1c was 6.5%.  Risk factors: diabetes, diabetic neuropathy, PAD, HTN, h/o tobacco use in remission.  Ashley Hose, MD is patient's PCP. Last visit was July 18, 2021.  Past Medical History:  Diagnosis Date   Anxiety    Arthritis    Constipation    COPD (chronic obstructive pulmonary disease) (HCC)    DDD (degenerative disc disease), cervical    DDD (degenerative disc disease), lumbar    Gout    Heart murmur    probable bicuspid AV with mild AS, mild MR by 04/22/14 Echo (Dr. Montez Morita)   Hepatitis C    treated 2016    History of blood transfusion    Hypertension    Pneumonia 12/02/13   Patient Active Problem List   Diagnosis Date Noted   Carpal tunnel syndrome 11/17/2020   Arthritis 09/03/2020   Severe sepsis ([place]) 04/25/2020   Sepsis ([place]) 04/19/2020   Neuropathic pain 03/01/2020   S/P TKR (total knee replacement) using cement, left 12/14/2019   Type II diabetes mellitus, uncontrolled 11/15/2019   Medication management 03/23/2019   Chronic pain of both knees 01/26/2019   Trochanteric bursitis, right hip 12/15/2018   Lumbar radiculopathy 11/06/2018   Acute right-sided low back pain 10/06/2018   COPD with acute exacerbation ([place]) 04/20/2018   Anxiety and depression 04/20/2018   Pain of left shoulder joint on movement 04/13/2017   Chronic bronchitis ([place]) 03/29/2017   Gouty arthropathy 03/02/2017   Hyperammonemia ([place]) 03/02/2017   Hypomagnesemia 03/02/2017   CAP (community acquired pneumonia) 02/18/2017   Liver cirrhosis ([place]) 02/18/2017   Leukocytosis 02/18/2017   Sinus tachycardia 02/18/2017   Tachypnea 02/18/2017   RLL pneumonia 02/18/2017    Increased ammonia level 02/18/2017   Asthma 11/25/2016   Idiopathic progressive polyneuropathy 11/25/2016   Peripheral vascular disease ([place]) 11/25/2016   Obesity 11/25/2016   Prediabetes 11/02/2016   Retained metal fragment left scapula 12/26/2015   Fracture of glenoid process of left scapula 12/29/2014   Status post total shoulder arthroplasty 12/27/2014   Cocaine dependence in remission ([place]) 09/29/2014   Severe heroin dependence in sustained remission ([place]) 09/29/2014   Opiate abuse, episodic ([place]) 09/29/2014   Mild tetrahydrocannabinol (THC) abuse 09/29/2014   Chronic pain syndrome 04/18/2014   Primary osteoarthritis of left shoulder 04/18/2014   Primary osteoarthritis of right knee 04/18/2014   Primary osteoarthritis of left knee 04/18/2014   HYPERTENSION, BENIGN ESSENTIAL 03/22/2010   CONSTIPATION 03/22/2010   HEPATITIS C 06/04/1995   Past Surgical History:  Procedure Laterality Date   ABDOMINAL HYSTERECTOMY     APPENDECTOMY     BACK SURGERY     CESAREAN SECTION     x 3   COLONOSCOPY W/ POLYPECTOMY     HARDWARE REMOVAL Left 12/26/2015   Procedure: REMOVAL K-WIRE LEFT SCAPULA;  Surgeon: Garald Balding, MD;  Location: [facility];  Service: Orthopedics;  Laterality: Left;   INNER EAR SURGERY     blood vessel   TONSILLECTOMY     TOTAL SHOULDER ARTHROPLASTY Left 12/27/2014   Procedure: TOTAL SHOULDER ARTHROPLASTY;  Surgeon: Garald Balding, MD;  Location: [facility];  Service: Orthopedics;  Laterality: Left;   Current Outpatient Medications on File Prior to  Visit  Medication Sig Dispense Refill   Buprenorphine HCl (BELBUCA) 750 MCG FILM Place inside cheek.     naloxone (NARCAN) nasal spray 4 mg/0.1 mL PLEASE SEE ATTACHED FOR DETAILED DIRECTIONS     ACCU-CHEK GUIDE test strip      acetaminophen (TYLENOL) 500 MG tablet Take 1,000 mg by mouth every 8 (eight) hours as needed for moderate pain.      albuterol (PROVENTIL HFA;VENTOLIN HFA) 108 (90 Base) MCG/ACT inhaler Inhale 2  puffs into the lungs every 2 (two) hours as needed for wheezing or shortness of breath.      aspirin 81 MG EC tablet aspirin 81 mg tablet,delayed release  TAKE 1 TABLET (81 MG TOTAL) BY MOUTH 2 TIMES DAILY FOR 30 DAYS.     azithromycin (ZITHROMAX) 250 MG tablet azithromycin 250 mg tablet  TAKE 1 TABLET BY MOUTH EVERY EVENING X 2 DAYS     BELBUCA 600 MCG FILM Take 1 Film by mouth in the morning and at bedtime.      budesonide (PULMICORT) 0.5 MG/2ML nebulizer solution Take 2 mLs (0.5 mg total) by nebulization 2 (two) times daily. (Patient taking differently: Take 0.5 mg by nebulization 2 (two) times daily as needed (wheezing). ) 120 mL 0   budesonide (PULMICORT) 0.5 MG/2ML nebulizer solution budesonide 0.5 mg/2 mL suspension for nebulization  INHALE 2 ML TWICE A DAY BY NEBULIZATION ROUTE     buprenorphine (BUTRANS) 20 MCG/HR PTWK buprenorphine 20 mcg/hour weekly transdermal patch     Buprenorphine HCl (BELBUCA) 750 MCG FILM Belbuca 750 mcg buccal film     Calcium Carbonate-Vit D-Min (CALCIUM 600+D PLUS MINERALS) 600-400 MG-UNIT TABS Calcium 600 + D(3) 600 mg-10 mcg (400 unit) tablet  Take 1 tablet twice a day by oral route for 30 days.     Calcium Carbonate-Vitamin D 600-400 MG-UNIT tablet Take 1 tablet by mouth daily.      Calcium Carbonate-Vitamin D 600-400 MG-UNIT tablet Calcium 600 + D(3) 600 mg-10 mcg (400 unit) tablet  Take 1 tablet twice a day by oral route for 30 days.     cefadroxil (DURICEF) 500 MG capsule cefadroxil 500 mg capsule  TAKE 1 CAPSULE (500 MG TOTAL) BY MOUTH 2 TIMES DAILY FOR 7 DAYS.     cefdinir (OMNICEF) 300 MG capsule cefdinir 300 mg capsule  TAKE 1 CAPSULE BY MOUTH EVERY 12 HOURS X 7DOSES     clotrimazole-betamethasone (LOTRISONE) cream APPLY TO AFFECTED AREA TWICE A DAY FOR 2 WEEKS     colchicine 0.6 MG tablet Take 0.6 mg by mouth daily as needed (gout flares).      Cyanocobalamin (B-12) 5000 MCG SUBL B12  take one tab per day     Cyanocobalamin (VITAMIN B-12 PO)  Take 1 tablet by mouth daily.     cyclobenzaprine (FLEXERIL) 5 MG tablet Take 5 mg by mouth daily as needed for muscle spasms.      diclofenac Sodium (VOLTAREN) 1 % GEL diclofenac 1 % topical gel  APPLY 2 GRAMS TO THE AFFECTED AREA(S) BY TOPICAL ROUTE 4 TIMES PER DAY     doxycycline (VIBRAMYCIN) 100 MG capsule doxycycline hyclate 100 mg capsule  TAKE 1 CAPSULE BY MOUTH TWICE A DAY FOR 7 DAYS     DULoxetine (CYMBALTA) 30 MG capsule Take 1 capsule (30 mg total) by mouth daily. (Patient not taking: Reported on 04/19/2020) 30 capsule 1   DULoxetine (CYMBALTA) 30 MG capsule duloxetine 30 mg capsule,delayed release  Take 1 capsule by mouth every day in  the evening     JANUVIA 25 MG tablet Take 25 mg by mouth every morning.     losartan-hydrochlorothiazide (HYZAAR) 100-25 MG tablet Take 1 tablet by mouth at bedtime.   3   metFORMIN (GLUMETZA) 500 MG (MOD) 24 hr tablet Take 500 mg by mouth daily with breakfast.      methylPREDNISolone (MEDROL DOSEPAK) 4 MG TBPK tablet See admin instructions.     Multiple Vitamin (MULTIVITAMIN WITH MINERALS) TABS tablet Take 1 tablet by mouth daily.     naloxone (NARCAN) nasal spray 4 mg/0.1 mL PLEASE SEE ATTACHED FOR DETAILED DIRECTIONS     Oxycodone HCl 10 MG TABS oxycodone 10 mg tablet     oxyCODONE-acetaminophen (PERCOCET) 10-325 MG tablet Take 1 tablet by mouth every 4 (four) hours as needed for pain.     rosuvastatin (CRESTOR) 5 MG tablet Take 5 mg by mouth daily.     sertraline (ZOLOFT) 50 MG tablet Take 25 mg by mouth daily.      tiotropium (SPIRIVA) 18 MCG inhalation capsule Place 18 mcg into inhaler and inhale daily as needed (shortness of breath).      tiZANidine (ZANAFLEX) 2 MG tablet Take 1 tablet (2 mg total) by mouth every 6 (six) hours as needed for muscle spasms. 15 tablet 0   tiZANidine (ZANAFLEX) 4 MG tablet tizanidine 4 mg tablet  TAKE 1 TABLET BY MOUTH EVERYDAY AT BEDTIME     valsartan-hydrochlorothiazide (DIOVAN-HCT) 320-12.5 MG tablet valsartan  320 mg-hydrochlorothiazide 12.5 mg tablet     No current facility-administered medications on file prior to visit.    Allergies  Allergen Reactions   Gabapentin Other (See Comments)   Pregabalin     Other reaction(s): Confusion   Aspirin Other (See Comments)    Due to history of hepatitis   Penicillins Hives    Has patient had a PCN reaction causing immediate rash, facial/tongue/throat swelling, SOB or lightheadedness with hypotension: Yes Has patient had a PCN reaction causing severe rash involving mucus membranes or skin necrosis: No Has patient had a PCN reaction that required hospitalization No Has patient had a PCN reaction occurring within the last 10 years: No If all of the above answers are "NO", then may proceed with Cephalosporin use.    Social History   Occupational History   Occupation: retired  Tobacco Use   Smoking status: Former    Packs/day: 1.00    Years: 49.00    Pack years: 49.00    Types: Cigarettes    Quit date: 2007    Years since quitting: 16.2   Smokeless tobacco: Never   Tobacco comments:    quit 2007  Vaping Use   Vaping Use: Never used  Substance and Sexual Activity   Alcohol use: No   Drug use: No    Types: Marijuana    Comment: quit marijuana 12/18. last used heroin and cocaine in 1992.  Smokes Marijuana once a week., 12/22/15- "2 weeks ago"   Sexual activity: Not on file   Family History  Problem Relation Age of Onset   Heart disease Mother    Kidney disease Mother    Alcohol abuse Mother    Breast cancer Daughter    Breast cancer Cousin 47   Immunization History  Administered Date(s) Administered   Influenza Inj Mdck Quad With Preservative 05/23/2020   Influenza Whole 03/22/2010   Influenza,inj,quad, With Preservative 04/11/2017   PFIZER(Purple Top)SARS-COV-2 Vaccination 08/07/2019, 08/28/2019   Pneumococcal Conjugate-13 10/09/2018   Zoster Recombinat (Shingrix) 02/18/2020  Review of Systems: Negative except as noted in the  HPI.   Objective: There were no vitals filed for this visit.  Ashley Riddle is a pleasant 76 y.o. female in NAD. AAO X 3.  Vascular Examination: CFT <3 seconds b/l LE. Palpable PT pulse(s) b/l LE. Palpable DP pulse(s) right lower extremity Diminished DP pulse(s) left lower extremity. Pedal hair absent. No pain with calf compression b/l. Lower extremity skin temperature gradient within normal limits. No edema noted b/l LE. Varicosities present b/l. No ischemia or gangrene noted b/l LE. No cyanosis or clubbing noted b/l LE.  Dermatological Examination: Pedal skin thin and atrophic b/l LE. No open wounds b/l LE. No interdigital macerations noted b/l LE. Toenails 2-5 bilaterally and left great toe elongated, discolored, dystrophic, thickened, and crumbly with subungual debris and tenderness to dorsal palpation. Anonychia noted right great toe. Nailbed(s) epithelialized.   Neurological Examination: Pt has subjective symptoms of neuropathy. Protective sensation intact 5/5 intact bilaterally with 10g monofilament b/l. Vibratory sensation intact b/l.  Musculoskeletal Examination: Muscle strength 5/5 to all lower extremity muscle groups bilaterally. HAV with bunion deformity noted b/l LE.  Footwear Assessment: Does the patient wear appropriate shoes? Yes. Does the patient need inserts/orthotics? No.  Assessment: 1. Pain due to onychomycosis of toenails of both feet   2. Hallux valgus, acquired, bilateral   3. Neuropathic pain   4. Type II diabetes mellitus with peripheral circulatory disorder (HCC)   5. Encounter for diabetic foot exam ([place])     ADA Risk Categorization: High Risk  Patient has one or more of the following: Loss of protective sensation Absent pedal pulses Severe Foot deformity History of foot ulcer  Plan: -Patient was evaluated and treated. All patient's and/or POA's questions/concerns answered on today's visit. -Diabetic foot examination performed  today. -Continue foot and shoe inspections daily. Monitor blood glucose per PCP/Endocrinologist's recommendations. -Mycotic toenails 2-5 bilaterally and L hallux were debrided in length and girth with sterile nail nippers and dremel without iatrogenic bleeding. -Patient/POA to call should there be question/concern in the interim. Return in about 3 months (around 11/07/2021).  Marzetta Board, DPM

## 2021-08-21 DIAGNOSIS — M25561 Pain in right knee: Secondary | ICD-10-CM | POA: Diagnosis not present

## 2021-08-21 DIAGNOSIS — M792 Neuralgia and neuritis, unspecified: Secondary | ICD-10-CM | POA: Diagnosis not present

## 2021-08-21 DIAGNOSIS — M25562 Pain in left knee: Secondary | ICD-10-CM | POA: Diagnosis not present

## 2021-08-21 DIAGNOSIS — G894 Chronic pain syndrome: Secondary | ICD-10-CM | POA: Diagnosis not present

## 2021-08-24 ENCOUNTER — Other Ambulatory Visit: Payer: Medicare Other

## 2021-09-10 DIAGNOSIS — R059 Cough, unspecified: Secondary | ICD-10-CM | POA: Diagnosis not present

## 2021-09-10 DIAGNOSIS — J441 Chronic obstructive pulmonary disease with (acute) exacerbation: Secondary | ICD-10-CM | POA: Diagnosis not present

## 2021-09-10 DIAGNOSIS — J410 Simple chronic bronchitis: Secondary | ICD-10-CM | POA: Diagnosis not present

## 2021-09-13 DIAGNOSIS — G5603 Carpal tunnel syndrome, bilateral upper limbs: Secondary | ICD-10-CM | POA: Diagnosis not present

## 2021-09-13 DIAGNOSIS — G5601 Carpal tunnel syndrome, right upper limb: Secondary | ICD-10-CM | POA: Diagnosis not present

## 2021-10-10 DIAGNOSIS — K7469 Other cirrhosis of liver: Secondary | ICD-10-CM | POA: Diagnosis not present

## 2021-10-11 ENCOUNTER — Other Ambulatory Visit: Payer: Self-pay | Admitting: Nurse Practitioner

## 2021-10-11 DIAGNOSIS — K7469 Other cirrhosis of liver: Secondary | ICD-10-CM

## 2021-10-17 ENCOUNTER — Other Ambulatory Visit: Payer: Medicare Other

## 2021-10-17 ENCOUNTER — Ambulatory Visit
Admission: RE | Admit: 2021-10-17 | Discharge: 2021-10-17 | Disposition: A | Discharge status: Home / Self Care | Payer: Medicare Other | Source: Ambulatory Visit | Attending: Nurse Practitioner | Admitting: Nurse Practitioner

## 2021-10-17 DIAGNOSIS — K7469 Other cirrhosis of liver: Secondary | ICD-10-CM

## 2021-10-17 DIAGNOSIS — K7460 Unspecified cirrhosis of liver: Secondary | ICD-10-CM | POA: Diagnosis not present

## 2021-10-18 DIAGNOSIS — H25042 Posterior subcapsular polar age-related cataract, left eye: Secondary | ICD-10-CM | POA: Diagnosis not present

## 2021-10-18 DIAGNOSIS — H25013 Cortical age-related cataract, bilateral: Secondary | ICD-10-CM | POA: Diagnosis not present

## 2021-10-18 DIAGNOSIS — H2513 Age-related nuclear cataract, bilateral: Secondary | ICD-10-CM | POA: Diagnosis not present

## 2021-10-18 DIAGNOSIS — E119 Type 2 diabetes mellitus without complications: Secondary | ICD-10-CM | POA: Diagnosis not present

## 2021-11-09 ENCOUNTER — Encounter: Payer: Self-pay | Admitting: Podiatry

## 2021-11-09 ENCOUNTER — Ambulatory Visit (INDEPENDENT_AMBULATORY_CARE_PROVIDER_SITE_OTHER): Payer: Medicare Other | Admitting: Podiatry

## 2021-11-09 DIAGNOSIS — E1151 Type 2 diabetes mellitus with diabetic peripheral angiopathy without gangrene: Secondary | ICD-10-CM

## 2021-11-09 DIAGNOSIS — M79674 Pain in right toe(s): Secondary | ICD-10-CM | POA: Diagnosis not present

## 2021-11-09 DIAGNOSIS — B351 Tinea unguium: Secondary | ICD-10-CM

## 2021-11-09 DIAGNOSIS — D229 Melanocytic nevi, unspecified: Secondary | ICD-10-CM

## 2021-11-09 DIAGNOSIS — M79675 Pain in left toe(s): Secondary | ICD-10-CM

## 2021-11-09 DIAGNOSIS — L84 Corns and callosities: Secondary | ICD-10-CM

## 2021-11-13 DIAGNOSIS — E1165 Type 2 diabetes mellitus with hyperglycemia: Secondary | ICD-10-CM | POA: Diagnosis not present

## 2021-11-13 DIAGNOSIS — E785 Hyperlipidemia, unspecified: Secondary | ICD-10-CM | POA: Diagnosis not present

## 2021-11-13 NOTE — Progress Notes (Signed)
  Subjective:  Patient ID: Ashley Riddle, female    DOB: 03-05-1946,  MRN: [id number]  Emanuelle Hammerstrom presents to clinic today for at risk foot care. Pt has h/o NIDDM with PAD and painful thick toenails that are difficult to trim. Pain interferes with ambulation. Aggravating factors include wearing enclosed shoe gear. Pain is relieved with periodic professional debridement.  Patient states blood glucose was 124 mg/dl today.  Last known HgA1c was 6.6%.  New problem(s): None.   PCP is Audley Hose, MD , and last visit was July 18, 2021.  Allergies  Allergen Reactions   Gabapentin Other (See Comments)   Pregabalin     Other reaction(s): Confusion   Aspirin Other (See Comments)    Due to history of hepatitis   Penicillins Hives    Has patient had a PCN reaction causing immediate rash, facial/tongue/throat swelling, SOB or lightheadedness with hypotension: Yes Has patient had a PCN reaction causing severe rash involving mucus membranes or skin necrosis: No Has patient had a PCN reaction that required hospitalization No Has patient had a PCN reaction occurring within the last 10 years: No If all of the above answers are "NO", then may proceed with Cephalosporin use.     Review of Systems: Negative except as noted in the HPI.  Objective:  There were no vitals filed for this visit.  Jaielle Dlouhy is a pleasant 76 y.o. female in NAD. AAO X 3.  Vascular Examination: CFT <3 seconds b/l LE. Palpable PT pulse(s) b/l LE. Palpable DP pulse(s) right lower extremity Diminished DP pulse(s) left lower extremity. Pedal hair absent. No pain with calf compression b/l. Lower extremity skin temperature gradient within normal limits. No edema noted b/l LE. Varicosities present b/l. No ischemia or gangrene noted b/l LE. No cyanosis or clubbing noted b/l LE.  Dermatological Examination: Pedal skin thin and atrophic b/l LE. No open wounds b/l LE. No interdigital macerations  noted b/l LE. Toenails 2-5 bilaterally and left great toe elongated, discolored, dystrophic, thickened, and crumbly with subungual debris and tenderness to dorsal palpation. Anonychia noted right great toe. Nailbed(s) epithelialized. Hyperkeratotic lesion(s) submet head 5 right foot with tenderness to palpation. No edema, no erythema, no drainage, no fluctuance.   [place]i noted plantar aspect of right heel x 2 and central arch area left foot.            Neurological Examination: Pt has subjective symptoms of neuropathy. Protective sensation intact 5/5 intact bilaterally with 10g monofilament b/l. Vibratory sensation intact b/l.  Musculoskeletal Examination: Muscle strength 5/5 to all lower extremity muscle groups bilaterally. HAV with bunion deformity noted b/l LE.  Assessment/Plan: 1. Pain due to onychomycosis of toenails of both feet   2. Callus   3. Suspicious nevus   4. Type II diabetes mellitus with peripheral circulatory disorder [facility])     -Patient was evaluated and treated. All patient's and/or POA's questions/concerns answered on today's visit. -Toenails 1-5 b/l were debrided in length and girth with sterile nail nippers and dremel without iatrogenic bleeding.  -Callus(es) submet head 5 right foot pared utilizing sterile scalpel blade without complication or incident. Total number debrided =1. -Dermatology consultation for evaluation/treatment of suspicious nevi b/l feet. -Patient/POA to call should there be question/concern in the interim.   Return in about 3 months (around 02/09/2022).  Ashley Riddle, DPM

## 2021-11-20 DIAGNOSIS — M25562 Pain in left knee: Secondary | ICD-10-CM | POA: Diagnosis not present

## 2021-11-20 DIAGNOSIS — G894 Chronic pain syndrome: Secondary | ICD-10-CM | POA: Diagnosis not present

## 2021-11-20 DIAGNOSIS — M25561 Pain in right knee: Secondary | ICD-10-CM | POA: Diagnosis not present

## 2021-11-20 DIAGNOSIS — Z79899 Other long term (current) drug therapy: Secondary | ICD-10-CM | POA: Diagnosis not present

## 2021-11-23 DIAGNOSIS — E785 Hyperlipidemia, unspecified: Secondary | ICD-10-CM | POA: Diagnosis not present

## 2021-11-23 DIAGNOSIS — Z1231 Encounter for screening mammogram for malignant neoplasm of breast: Secondary | ICD-10-CM | POA: Diagnosis not present

## 2021-11-23 DIAGNOSIS — Z13820 Encounter for screening for osteoporosis: Secondary | ICD-10-CM | POA: Diagnosis not present

## 2021-11-23 DIAGNOSIS — M109 Gout, unspecified: Secondary | ICD-10-CM | POA: Diagnosis not present

## 2021-11-23 DIAGNOSIS — E1165 Type 2 diabetes mellitus with hyperglycemia: Secondary | ICD-10-CM | POA: Diagnosis not present

## 2021-11-23 DIAGNOSIS — Z1211 Encounter for screening for malignant neoplasm of colon: Secondary | ICD-10-CM | POA: Diagnosis not present

## 2021-11-23 DIAGNOSIS — I10 Essential (primary) hypertension: Secondary | ICD-10-CM | POA: Diagnosis not present

## 2021-11-23 DIAGNOSIS — E1142 Type 2 diabetes mellitus with diabetic polyneuropathy: Secondary | ICD-10-CM | POA: Diagnosis not present

## 2021-11-23 DIAGNOSIS — K7460 Unspecified cirrhosis of liver: Secondary | ICD-10-CM | POA: Diagnosis not present

## 2021-11-23 DIAGNOSIS — Z23 Encounter for immunization: Secondary | ICD-10-CM | POA: Diagnosis not present

## 2021-11-23 DIAGNOSIS — Z0001 Encounter for general adult medical examination with abnormal findings: Secondary | ICD-10-CM | POA: Diagnosis not present

## 2021-11-30 ENCOUNTER — Other Ambulatory Visit: Payer: Self-pay | Admitting: Internal Medicine

## 2021-11-30 ENCOUNTER — Other Ambulatory Visit: Payer: Medicare Other

## 2021-11-30 DIAGNOSIS — Z13820 Encounter for screening for osteoporosis: Secondary | ICD-10-CM

## 2021-12-05 ENCOUNTER — Other Ambulatory Visit: Payer: Self-pay | Admitting: Internal Medicine

## 2021-12-05 DIAGNOSIS — Z1231 Encounter for screening mammogram for malignant neoplasm of breast: Secondary | ICD-10-CM

## 2022-01-10 DIAGNOSIS — G5601 Carpal tunnel syndrome, right upper limb: Secondary | ICD-10-CM | POA: Diagnosis not present

## 2022-01-10 DIAGNOSIS — G5603 Carpal tunnel syndrome, bilateral upper limbs: Secondary | ICD-10-CM | POA: Diagnosis not present

## 2022-01-18 DIAGNOSIS — Z79899 Other long term (current) drug therapy: Secondary | ICD-10-CM | POA: Diagnosis not present

## 2022-02-12 DIAGNOSIS — E1142 Type 2 diabetes mellitus with diabetic polyneuropathy: Secondary | ICD-10-CM | POA: Diagnosis not present

## 2022-02-12 DIAGNOSIS — R051 Acute cough: Secondary | ICD-10-CM | POA: Diagnosis not present

## 2022-02-12 DIAGNOSIS — I739 Peripheral vascular disease, unspecified: Secondary | ICD-10-CM | POA: Diagnosis not present

## 2022-02-12 DIAGNOSIS — J441 Chronic obstructive pulmonary disease with (acute) exacerbation: Secondary | ICD-10-CM | POA: Diagnosis not present

## 2022-02-12 DIAGNOSIS — E1165 Type 2 diabetes mellitus with hyperglycemia: Secondary | ICD-10-CM | POA: Diagnosis not present

## 2022-02-13 DIAGNOSIS — G894 Chronic pain syndrome: Secondary | ICD-10-CM | POA: Diagnosis not present

## 2022-02-13 DIAGNOSIS — M792 Neuralgia and neuritis, unspecified: Secondary | ICD-10-CM | POA: Diagnosis not present

## 2022-02-13 DIAGNOSIS — M25561 Pain in right knee: Secondary | ICD-10-CM | POA: Diagnosis not present

## 2022-02-18 DIAGNOSIS — H35033 Hypertensive retinopathy, bilateral: Secondary | ICD-10-CM | POA: Diagnosis not present

## 2022-02-18 DIAGNOSIS — H2513 Age-related nuclear cataract, bilateral: Secondary | ICD-10-CM | POA: Diagnosis not present

## 2022-02-18 DIAGNOSIS — E119 Type 2 diabetes mellitus without complications: Secondary | ICD-10-CM | POA: Diagnosis not present

## 2022-02-18 DIAGNOSIS — H25013 Cortical age-related cataract, bilateral: Secondary | ICD-10-CM | POA: Diagnosis not present

## 2022-02-18 DIAGNOSIS — H2512 Age-related nuclear cataract, left eye: Secondary | ICD-10-CM | POA: Diagnosis not present

## 2022-02-18 DIAGNOSIS — H52213 Irregular astigmatism, bilateral: Secondary | ICD-10-CM | POA: Diagnosis not present

## 2022-02-20 ENCOUNTER — Ambulatory Visit (INDEPENDENT_AMBULATORY_CARE_PROVIDER_SITE_OTHER): Payer: Medicare Other | Admitting: Podiatry

## 2022-02-20 ENCOUNTER — Encounter: Payer: Self-pay | Admitting: Podiatry

## 2022-02-20 DIAGNOSIS — M79675 Pain in left toe(s): Secondary | ICD-10-CM

## 2022-02-20 DIAGNOSIS — L84 Corns and callosities: Secondary | ICD-10-CM

## 2022-02-20 DIAGNOSIS — B351 Tinea unguium: Secondary | ICD-10-CM | POA: Diagnosis not present

## 2022-02-20 DIAGNOSIS — M79674 Pain in right toe(s): Secondary | ICD-10-CM

## 2022-02-20 DIAGNOSIS — E1151 Type 2 diabetes mellitus with diabetic peripheral angiopathy without gangrene: Secondary | ICD-10-CM

## 2022-02-21 DIAGNOSIS — E1165 Type 2 diabetes mellitus with hyperglycemia: Secondary | ICD-10-CM | POA: Diagnosis not present

## 2022-02-26 DIAGNOSIS — H25042 Posterior subcapsular polar age-related cataract, left eye: Secondary | ICD-10-CM | POA: Diagnosis not present

## 2022-02-26 DIAGNOSIS — H25812 Combined forms of age-related cataract, left eye: Secondary | ICD-10-CM | POA: Diagnosis not present

## 2022-02-26 DIAGNOSIS — H2512 Age-related nuclear cataract, left eye: Secondary | ICD-10-CM | POA: Diagnosis not present

## 2022-02-26 NOTE — Progress Notes (Signed)
  Subjective:  Patient ID: Ashley Riddle, female    DOB: 09-06-1945,  MRN: [id number]  Ashley Riddle presents to clinic today for at risk foot care. Pt has h/o NIDDM with PAD and callus(es) b/l lower extremities and painful thick toenails that are difficult to trim. Painful toenails interfere with ambulation. Aggravating factors include wearing enclosed shoe gear. Pain is relieved with periodic professional debridement. Painful calluses are aggravated when weightbearing with and without shoegear. Pain is relieved with periodic professional debridement.  Last known  HgA1c was 6.7%.  Patient did not check blood glucose this morning.  New problem(s): None.   PCP is Audley Hose, MD , and last visit was  February 12, 2022.  Allergies  Allergen Reactions   Gabapentin Other (See Comments)   Pregabalin     Other reaction(s): Confusion   Aspirin Other (See Comments)    Due to history of hepatitis   Penicillins Hives    Has patient had a PCN reaction causing immediate rash, facial/tongue/throat swelling, SOB or lightheadedness with hypotension: Yes Has patient had a PCN reaction causing severe rash involving mucus membranes or skin necrosis: No Has patient had a PCN reaction that required hospitalization No Has patient had a PCN reaction occurring within the last 10 years: No If all of the above answers are "NO", then may proceed with Cephalosporin use.     Review of Systems: Negative except as noted in the HPI.  Objective: No changes noted in today's physical examination. Ashley Riddle is a pleasant 76 y.o. female in NAD. AAO x 3. Vascular Examination: CFT <3 seconds b/l LE. Palpable PT pulse(s) b/l LE. Palpable DP pulse(s) right lower extremity. Diminished DP pulse(s) left lower extremity. Pedal hair absent. No pain with calf compression b/l. Lower extremity skin temperature gradient within normal limits. No edema noted b/l LE. Varicosities present b/l. No ischemia  or gangrene noted b/l LE. No cyanosis or clubbing noted b/l LE.  Dermatological Examination: Pedal skin thin and atrophic b/l LE. No open wounds b/l LE. No interdigital macerations noted b/l LE.   Toenails 2-5 bilaterally and left great toe elongated, discolored, dystrophic, thickened, and crumbly with subungual debris and tenderness to dorsal palpation. Anonychia noted right great toe. Nailbed(s) epithelialized.   Hyperkeratotic lesion(s) submet head 5 b/l with tenderness to palpation. No edema, no erythema, no drainage, no fluctuance.   Nevi noted plantar aspect of right heel x 2 and central arch area left foot.  Neurological Examination: Pt has subjective symptoms of neuropathy. Protective sensation intact 5/5 intact bilaterally with 10g monofilament b/l. Vibratory sensation intact b/l.  Musculoskeletal Examination: Muscle strength 5/5 to all lower extremity muscle groups bilaterally. HAV with bunion deformity noted b/l LE.  Assessment/Plan: 1. Pain due to onychomycosis of toenails of both feet   2. Callus   3. Type II diabetes mellitus with peripheral circulatory disorder [facility])     -Consent given for treatment as described below: -Examined patient. -Mycotic toenails 1-5 bilaterally were debrided in length and girth with sterile nail nippers and dremel without incident. -Callus(es) submet head 5 b/l pared utilizing sterile scalpel blade without complication or incident. Total number debrided =2. -Patient/POA to call should there be question/concern in the interim.   Return in about 3 months (around 05/22/2022).  Ashley Riddle, DPM

## 2022-03-05 DIAGNOSIS — D6489 Other specified anemias: Secondary | ICD-10-CM | POA: Diagnosis not present

## 2022-03-05 DIAGNOSIS — I10 Essential (primary) hypertension: Secondary | ICD-10-CM | POA: Diagnosis not present

## 2022-03-05 DIAGNOSIS — E1142 Type 2 diabetes mellitus with diabetic polyneuropathy: Secondary | ICD-10-CM | POA: Diagnosis not present

## 2022-03-05 DIAGNOSIS — E1165 Type 2 diabetes mellitus with hyperglycemia: Secondary | ICD-10-CM | POA: Diagnosis not present

## 2022-03-05 DIAGNOSIS — J440 Chronic obstructive pulmonary disease with acute lower respiratory infection: Secondary | ICD-10-CM | POA: Diagnosis not present

## 2022-03-05 DIAGNOSIS — M1712 Unilateral primary osteoarthritis, left knee: Secondary | ICD-10-CM | POA: Diagnosis not present

## 2022-03-15 ENCOUNTER — Ambulatory Visit
Admission: RE | Admit: 2022-03-15 | Discharge: 2022-03-15 | Disposition: A | Discharge status: Home / Self Care | Payer: Medicare Other | Source: Ambulatory Visit | Attending: Internal Medicine | Admitting: Internal Medicine

## 2022-03-15 DIAGNOSIS — Z1231 Encounter for screening mammogram for malignant neoplasm of breast: Secondary | ICD-10-CM

## 2022-03-22 ENCOUNTER — Institutional Professional Consult (permissible substitution): Payer: Medicare Other | Admitting: Pulmonary Disease

## 2022-03-26 DIAGNOSIS — H25011 Cortical age-related cataract, right eye: Secondary | ICD-10-CM | POA: Diagnosis not present

## 2022-03-26 DIAGNOSIS — H25811 Combined forms of age-related cataract, right eye: Secondary | ICD-10-CM | POA: Diagnosis not present

## 2022-03-26 DIAGNOSIS — H2511 Age-related nuclear cataract, right eye: Secondary | ICD-10-CM | POA: Diagnosis not present

## 2022-04-08 ENCOUNTER — Ambulatory Visit (INDEPENDENT_AMBULATORY_CARE_PROVIDER_SITE_OTHER): Payer: Medicare Other

## 2022-04-08 ENCOUNTER — Encounter: Payer: Self-pay | Admitting: Pulmonary Disease

## 2022-04-08 ENCOUNTER — Ambulatory Visit (INDEPENDENT_AMBULATORY_CARE_PROVIDER_SITE_OTHER): Payer: Medicare Other | Admitting: Pulmonary Disease

## 2022-04-08 VITALS — BP 154/84 | HR 96 | Temp 97.7°F | Ht 61.0 in | Wt 204.4 lb

## 2022-04-08 DIAGNOSIS — J441 Chronic obstructive pulmonary disease with (acute) exacerbation: Secondary | ICD-10-CM

## 2022-04-08 NOTE — Patient Instructions (Addendum)
Chest x-ray  Schedule for PFT  Follow-up in 4 to 6 weeks  Call with significant concerns that this lady get a chest x-ray today Ashley Riddle

## 2022-04-08 NOTE — Progress Notes (Signed)
This woman was not as well  I think I did not have a clear 10 here for:              Ashley Riddle    [id number]    1946-04-22  Primary Care Physician:Bakare, Larey Dresser, MD  Referring Physician: Audley Hose, MD [place],  [place] [id number]  Chief complaint:   Patient seen for chronic obstructive pulmonary disease  HPI:  Patient with COPD, compliant with inhalers including Spiriva, albuterol, does have a nebulizer to use as needed as well  Shortness of breath with exertion Has had COPD for many years She has a chronic cough with thick mucus production  Stated she was recently treated for an exacerbation with a course of antibiotics and steroids  She quit smoking in 2007 was smoking about a third of a pack a day  Able to walk on level ground without significant limitation, states she can walk up to 1 mile, she does get short of breath walking uphill or up steps  She states she is compliant with inhalers  Patient stated that she has been told about spots in the lungs in the past but she is on multiple x-rays that have led to multiple hospitalizations with no further intervention  Outpatient Encounter Medications as of 04/08/2022  Medication Sig   ACCU-CHEK GUIDE test strip    acetaminophen (TYLENOL) 500 MG tablet Take 1,000 mg by mouth every 8 (eight) hours as needed for moderate pain.    albuterol (PROVENTIL HFA;VENTOLIN HFA) 108 (90 Base) MCG/ACT inhaler Inhale 2 puffs into the lungs every 2 (two) hours as needed for wheezing or shortness of breath.    aspirin 81 MG EC tablet aspirin 81 mg tablet,delayed release  TAKE 1 TABLET (81 MG TOTAL) BY MOUTH 2 TIMES DAILY FOR 30 DAYS.   BELBUCA 600 MCG FILM Take 1 Film by mouth in the morning and at bedtime.    brimonidine (ALPHAGAN) 0.2 % ophthalmic solution APPLY 1 DROP INTO LEFT EYE THREE TIMES A DAY START 48 HOURS PRIOR TO SURGERY.   budesonide (PULMICORT) 0.5 MG/2ML nebulizer solution Inhale  into the lungs.   Calcium Carbonate-Vit D-Min (CALCIUM 600+D PLUS MINERALS) 600-400 MG-UNIT TABS Calcium 600 + D(3) 600 mg-10 mcg (400 unit) tablet  Take 1 tablet twice a day by oral route for 30 days.   colchicine 0.6 MG tablet Take 0.6 mg by mouth daily as needed (gout flares).    Cyanocobalamin (B-12) 5000 MCG SUBL B12  take one tab per day   cyclobenzaprine (FLEXERIL) 5 MG tablet Take 5 mg by mouth daily as needed for muscle spasms.    diclofenac Sodium (VOLTAREN) 1 % GEL diclofenac 1 % topical gel  APPLY 2 GRAMS TO THE AFFECTED AREA(S) BY TOPICAL ROUTE 4 TIMES PER DAY   DULoxetine (CYMBALTA) 30 MG capsule duloxetine 30 mg capsule,delayed release  Take 1 capsule by mouth every day in the evening   ipratropium-albuterol (DUONEB) 0.5-2.5 (3) MG/3ML SOLN Inhale into the lungs.   JANUVIA 25 MG tablet Take 25 mg by mouth every morning.   losartan (COZAAR) 100 MG tablet Take 1 tablet by mouth daily.   metFORMIN (GLUCOPHAGE-XR) 500 MG 24 hr tablet Take 1 tablet by mouth daily.   Multiple Vitamin (MULTIVITAMIN WITH MINERALS) TABS tablet Take 1 tablet by mouth daily.   naloxone (NARCAN) nasal spray 4 mg/0.1 mL PLEASE SEE ATTACHED FOR DETAILED DIRECTIONS   Oxycodone HCl 10 MG TABS PLEASE  SEE ATTACHED FOR DETAILED DIRECTIONS   oxyCODONE-acetaminophen (PERCOCET) 10-325 MG tablet Take 1 tablet by mouth every 4 (four) hours as needed for pain.   rosuvastatin (CRESTOR) 5 MG tablet Take 5 mg by mouth daily.   sertraline (ZOLOFT) 50 MG tablet Take 25 mg by mouth daily.    tiotropium (SPIRIVA) 18 MCG inhalation capsule Place 18 mcg into inhaler and inhale daily as needed (shortness of breath).    tiZANidine (ZANAFLEX) 4 MG tablet tizanidine 4 mg tablet  TAKE 1 TABLET BY MOUTH EVERYDAY AT BEDTIME   valsartan-hydrochlorothiazide (DIOVAN-HCT) 320-12.5 MG tablet valsartan 320 mg-hydrochlorothiazide 12.5 mg tablet   azithromycin (ZITHROMAX) 250 MG tablet azithromycin 250 mg tablet  TAKE 1 TABLET BY MOUTH  EVERY EVENING X 2 DAYS (Patient not taking: Reported on 04/08/2022)   buprenorphine (BUTRANS) 20 MCG/HR PTWK buprenorphine 20 mcg/hour weekly transdermal patch (Patient not taking: Reported on 04/08/2022)   Buprenorphine HCl (BELBUCA) 750 MCG FILM Place inside cheek. (Patient not taking: Reported on 04/08/2022)   Calcium Carbonate-Vitamin D 600-400 MG-UNIT tablet Take 1 tablet by mouth daily.  (Patient not taking: Reported on 04/08/2022)   Calcium Carbonate-Vitamin D 600-400 MG-UNIT tablet Calcium 600 + D(3) 600 mg-10 mcg (400 unit) tablet  Take 1 tablet twice a day by oral route for 30 days. (Patient not taking: Reported on 04/08/2022)   cefadroxil (DURICEF) 500 MG capsule cefadroxil 500 mg capsule  TAKE 1 CAPSULE (500 MG TOTAL) BY MOUTH 2 TIMES DAILY FOR 7 DAYS. (Patient not taking: Reported on 04/08/2022)   cefdinir (OMNICEF) 300 MG capsule cefdinir 300 mg capsule  TAKE 1 CAPSULE BY MOUTH EVERY 12 HOURS X 7DOSES (Patient not taking: Reported on 04/08/2022)   clotrimazole-betamethasone (LOTRISONE) cream APPLY TO AFFECTED AREA TWICE A DAY FOR 2 WEEKS (Patient not taking: Reported on 04/08/2022)   Cyanocobalamin (VITAMIN B-12 PO) Take 1 tablet by mouth daily. (Patient not taking: Reported on 04/08/2022)   doxycycline (VIBRAMYCIN) 100 MG capsule doxycycline hyclate 100 mg capsule  TAKE 1 CAPSULE BY MOUTH TWICE A DAY FOR 7 DAYS (Patient not taking: Reported on 04/08/2022)   losartan-hydrochlorothiazide (HYZAAR) 100-25 MG tablet Take 1 tablet by mouth at bedtime.  (Patient not taking: Reported on 04/08/2022)   methylPREDNISolone (MEDROL DOSEPAK) 4 MG TBPK tablet See admin instructions. (Patient not taking: Reported on 04/08/2022)   naloxone (NARCAN) nasal spray 4 mg/0.1 mL PLEASE SEE ATTACHED FOR DETAILED DIRECTIONS (Patient not taking: Reported on 04/08/2022)   tiZANidine (ZANAFLEX) 2 MG tablet Take 1 tablet (2 mg total) by mouth every 6 (six) hours as needed for muscle spasms. (Patient not taking: Reported on  04/08/2022)   No facility-administered encounter medications on file as of 04/08/2022.    Allergies as of 04/08/2022 - Review Complete 04/08/2022  Allergen Reaction Noted   Gabapentin Other (See Comments) 11/19/2018   Pregabalin  11/19/2018   Aspirin Other (See Comments) 02/18/2017   Penicillins Hives 03/22/2010    Past Medical History:  Diagnosis Date   Anxiety    Arthritis    Constipation    COPD (chronic obstructive pulmonary disease) (HCC)    DDD (degenerative disc disease), cervical    DDD (degenerative disc disease), lumbar    Gout    Heart murmur    probable bicuspid AV with mild AS, mild MR by 04/22/14 Echo (Dr. Montez Morita)   Hepatitis C    treated 2016    History of blood transfusion    Hypertension    Pneumonia 12/02/13    Past Surgical  History:  Procedure Laterality Date   ABDOMINAL HYSTERECTOMY     APPENDECTOMY     BACK SURGERY     CESAREAN SECTION     x 3   COLONOSCOPY W/ POLYPECTOMY     HARDWARE REMOVAL Left 12/26/2015   Procedure: REMOVAL K-WIRE LEFT SCAPULA;  Surgeon: Garald Balding, MD;  Location: [facility];  Service: Orthopedics;  Laterality: Left;   INNER EAR SURGERY     blood vessel   TONSILLECTOMY     TOTAL SHOULDER ARTHROPLASTY Left 12/27/2014   Procedure: TOTAL SHOULDER ARTHROPLASTY;  Surgeon: Garald Balding, MD;  Location: [facility];  Service: Orthopedics;  Laterality: Left;    Family History  Problem Relation Age of Onset   Heart disease Mother    Kidney disease Mother    Alcohol abuse Mother    Breast cancer Daughter    Breast cancer Cousin 57    Social History   Socioeconomic History   Marital status: Widowed    Spouse name: Not on file   Number of children: Not on file   Years of education: Not on file   Highest education level: Not on file  Occupational History   Occupation: retired  Tobacco Use   Smoking status: Former    Packs/day: 1.00    Years: 49.00    Total pack years: 49.00    Types: Cigarettes    Quit date: 2007     Years since quitting: 16.8   Smokeless tobacco: Never   Tobacco comments:    quit 2007  Vaping Use   Vaping Use: Never used  Substance and Sexual Activity   Alcohol use: No   Drug use: No    Types: Marijuana    Comment: quit marijuana 12/18. last used heroin and cocaine in 1992.  Smokes Marijuana once a week., 12/22/15- "2 weeks ago"   Sexual activity: Not on file  Other Topics Concern   Not on file  Social History Narrative   Not on file   Social Determinants of Health   Financial Resource Strain: Not on file  Food Insecurity: Not on file  Transportation Needs: Not on file  Physical Activity: Not on file  Stress: Not on file  Social Connections: Not on file  Intimate Partner Violence: Not on file    Review of Systems  Respiratory:  Positive for cough and shortness of breath.     There were no vitals filed for this visit.   Physical Exam Constitutional:      Appearance: She is obese.  HENT:     Head: Normocephalic.     Mouth/Throat:     Mouth: Mucous membranes are moist.  Eyes:     Pupils: Pupils are equal, round, and reactive to light.  Cardiovascular:     Rate and Rhythm: Normal rate and regular rhythm.     Heart sounds: No murmur heard.    No friction rub.  Pulmonary:     Effort: No respiratory distress.     Breath sounds: No stridor. No wheezing or rhonchi.     Comments: Poor air entry bilaterally Musculoskeletal:     Cervical back: No rigidity or tenderness.  Neurological:     Mental Status: She is alert.  Psychiatric:        Mood and Affect: Mood normal.    Data Reviewed: Last chest x-ray and CT was in 2021 showing extensive infiltrates  Reviewed records on care everywhere, last radiological data noted was in 2021  Assessment:  Chronic obstructive pulmonary disease  Chronic cough  Abnormal radiological data  History of cirrhosis Type 2 diabetes Polyneuropathy Chronic pain syndrome  Plan/Recommendations: Obtain a chest x-ray  May  need a CT scan of the chest depending on findings on x-ray  Schedule for pulmonary function test  Follow-up in 4 to 6 weeks  Concerned about last chest x-ray showing multiple multifocal infiltrates, do not have any radiological data that they completely resolved Patient also with a history of lung nodules in the past  Encouraged to continue using Spiriva and albuterol as needed  Graded exercise as tolerated  Encouraged to call with significant concerns   Sherrilyn Rist MD [facility] 04/08/2022, 3:36 PM  CC: Audley Hose, MD

## 2022-04-09 ENCOUNTER — Telehealth: Payer: Self-pay | Admitting: Pulmonary Disease

## 2022-04-09 DIAGNOSIS — J449 Chronic obstructive pulmonary disease, unspecified: Secondary | ICD-10-CM | POA: Diagnosis not present

## 2022-04-09 NOTE — Telephone Encounter (Signed)
Reviewed your chest x-ray   No significant abnormality noted on

## 2022-04-09 NOTE — Telephone Encounter (Signed)
Called and spoke to patient and went over chest xray results with her. Nothing further needed

## 2022-04-22 ENCOUNTER — Other Ambulatory Visit: Payer: Self-pay | Admitting: Nurse Practitioner

## 2022-04-22 DIAGNOSIS — K7469 Other cirrhosis of liver: Secondary | ICD-10-CM | POA: Diagnosis not present

## 2022-04-22 DIAGNOSIS — K5900 Constipation, unspecified: Secondary | ICD-10-CM | POA: Diagnosis not present

## 2022-04-25 ENCOUNTER — Emergency Department (HOSPITAL_COMMUNITY): Payer: Medicare Other

## 2022-04-25 ENCOUNTER — Inpatient Hospital Stay (HOSPITAL_COMMUNITY)
Admission: EM | Admit: 2022-04-25 | Discharge: 2022-04-28 | DRG: 871 | Disposition: A | Discharge status: Home / Self Care | Payer: Medicare Other | Attending: Internal Medicine | Admitting: Internal Medicine

## 2022-04-25 ENCOUNTER — Encounter (HOSPITAL_COMMUNITY): Payer: Self-pay | Admitting: Internal Medicine

## 2022-04-25 ENCOUNTER — Other Ambulatory Visit: Payer: Self-pay

## 2022-04-25 DIAGNOSIS — E861 Hypovolemia: Secondary | ICD-10-CM | POA: Diagnosis not present

## 2022-04-25 DIAGNOSIS — Z743 Need for continuous supervision: Secondary | ICD-10-CM | POA: Diagnosis not present

## 2022-04-25 DIAGNOSIS — R059 Cough, unspecified: Secondary | ICD-10-CM | POA: Diagnosis not present

## 2022-04-25 DIAGNOSIS — J410 Simple chronic bronchitis: Secondary | ICD-10-CM | POA: Diagnosis not present

## 2022-04-25 DIAGNOSIS — I499 Cardiac arrhythmia, unspecified: Secondary | ICD-10-CM | POA: Diagnosis not present

## 2022-04-25 DIAGNOSIS — R109 Unspecified abdominal pain: Secondary | ICD-10-CM | POA: Diagnosis not present

## 2022-04-25 DIAGNOSIS — A4150 Gram-negative sepsis, unspecified: Secondary | ICD-10-CM | POA: Diagnosis not present

## 2022-04-25 DIAGNOSIS — Z1152 Encounter for screening for COVID-19: Secondary | ICD-10-CM | POA: Diagnosis not present

## 2022-04-25 DIAGNOSIS — E119 Type 2 diabetes mellitus without complications: Secondary | ICD-10-CM

## 2022-04-25 DIAGNOSIS — E876 Hypokalemia: Secondary | ICD-10-CM | POA: Diagnosis not present

## 2022-04-25 DIAGNOSIS — G894 Chronic pain syndrome: Secondary | ICD-10-CM | POA: Diagnosis present

## 2022-04-25 DIAGNOSIS — K7460 Unspecified cirrhosis of liver: Secondary | ICD-10-CM | POA: Diagnosis present

## 2022-04-25 DIAGNOSIS — Z79899 Other long term (current) drug therapy: Secondary | ICD-10-CM

## 2022-04-25 DIAGNOSIS — Z6837 Body mass index (BMI) 37.0-37.9, adult: Secondary | ICD-10-CM

## 2022-04-25 DIAGNOSIS — Z87891 Personal history of nicotine dependence: Secondary | ICD-10-CM

## 2022-04-25 DIAGNOSIS — I10 Essential (primary) hypertension: Secondary | ICD-10-CM | POA: Diagnosis present

## 2022-04-25 DIAGNOSIS — R079 Chest pain, unspecified: Secondary | ICD-10-CM | POA: Diagnosis not present

## 2022-04-25 DIAGNOSIS — Z803 Family history of malignant neoplasm of breast: Secondary | ICD-10-CM

## 2022-04-25 DIAGNOSIS — I739 Peripheral vascular disease, unspecified: Secondary | ICD-10-CM | POA: Diagnosis not present

## 2022-04-25 DIAGNOSIS — J440 Chronic obstructive pulmonary disease with acute lower respiratory infection: Secondary | ICD-10-CM | POA: Diagnosis not present

## 2022-04-25 DIAGNOSIS — F1911 Other psychoactive substance abuse, in remission: Secondary | ICD-10-CM | POA: Diagnosis present

## 2022-04-25 DIAGNOSIS — Z841 Family history of disorders of kidney and ureter: Secondary | ICD-10-CM

## 2022-04-25 DIAGNOSIS — M109 Gout, unspecified: Secondary | ICD-10-CM | POA: Diagnosis present

## 2022-04-25 DIAGNOSIS — A419 Sepsis, unspecified organism: Secondary | ICD-10-CM | POA: Diagnosis not present

## 2022-04-25 DIAGNOSIS — Z880 Allergy status to penicillin: Secondary | ICD-10-CM

## 2022-04-25 DIAGNOSIS — F419 Anxiety disorder, unspecified: Secondary | ICD-10-CM | POA: Diagnosis present

## 2022-04-25 DIAGNOSIS — E871 Hypo-osmolality and hyponatremia: Secondary | ICD-10-CM | POA: Diagnosis not present

## 2022-04-25 DIAGNOSIS — R0689 Other abnormalities of breathing: Secondary | ICD-10-CM | POA: Diagnosis not present

## 2022-04-25 DIAGNOSIS — R000 Tachycardia, unspecified: Secondary | ICD-10-CM | POA: Diagnosis not present

## 2022-04-25 DIAGNOSIS — R1031 Right lower quadrant pain: Secondary | ICD-10-CM | POA: Diagnosis not present

## 2022-04-25 DIAGNOSIS — Z7984 Long term (current) use of oral hypoglycemic drugs: Secondary | ICD-10-CM

## 2022-04-25 DIAGNOSIS — I700 Atherosclerosis of aorta: Secondary | ICD-10-CM | POA: Diagnosis not present

## 2022-04-25 DIAGNOSIS — R0602 Shortness of breath: Secondary | ICD-10-CM | POA: Diagnosis not present

## 2022-04-25 DIAGNOSIS — E1151 Type 2 diabetes mellitus with diabetic peripheral angiopathy without gangrene: Secondary | ICD-10-CM | POA: Diagnosis not present

## 2022-04-25 DIAGNOSIS — F32A Depression, unspecified: Secondary | ICD-10-CM | POA: Diagnosis not present

## 2022-04-25 DIAGNOSIS — Z888 Allergy status to other drugs, medicaments and biological substances status: Secondary | ICD-10-CM

## 2022-04-25 DIAGNOSIS — E669 Obesity, unspecified: Secondary | ICD-10-CM | POA: Diagnosis present

## 2022-04-25 DIAGNOSIS — M1990 Unspecified osteoarthritis, unspecified site: Secondary | ICD-10-CM | POA: Diagnosis present

## 2022-04-25 DIAGNOSIS — Z7951 Long term (current) use of inhaled steroids: Secondary | ICD-10-CM

## 2022-04-25 DIAGNOSIS — B1710 Acute hepatitis C without hepatic coma: Secondary | ICD-10-CM

## 2022-04-25 DIAGNOSIS — K5900 Constipation, unspecified: Secondary | ICD-10-CM | POA: Diagnosis present

## 2022-04-25 DIAGNOSIS — Z8249 Family history of ischemic heart disease and other diseases of the circulatory system: Secondary | ICD-10-CM | POA: Diagnosis not present

## 2022-04-25 DIAGNOSIS — R32 Unspecified urinary incontinence: Secondary | ICD-10-CM | POA: Diagnosis present

## 2022-04-25 DIAGNOSIS — R6889 Other general symptoms and signs: Secondary | ICD-10-CM | POA: Diagnosis not present

## 2022-04-25 DIAGNOSIS — R0789 Other chest pain: Secondary | ICD-10-CM | POA: Diagnosis not present

## 2022-04-25 DIAGNOSIS — J189 Pneumonia, unspecified organism: Secondary | ICD-10-CM | POA: Diagnosis not present

## 2022-04-25 DIAGNOSIS — J42 Unspecified chronic bronchitis: Secondary | ICD-10-CM

## 2022-04-25 DIAGNOSIS — E1140 Type 2 diabetes mellitus with diabetic neuropathy, unspecified: Secondary | ICD-10-CM | POA: Diagnosis not present

## 2022-04-25 DIAGNOSIS — Z811 Family history of alcohol abuse and dependence: Secondary | ICD-10-CM

## 2022-04-25 DIAGNOSIS — Z886 Allergy status to analgesic agent status: Secondary | ICD-10-CM

## 2022-04-25 DIAGNOSIS — B1920 Unspecified viral hepatitis C without hepatic coma: Secondary | ICD-10-CM | POA: Diagnosis present

## 2022-04-25 DIAGNOSIS — Z7982 Long term (current) use of aspirin: Secondary | ICD-10-CM

## 2022-04-25 LAB — CBC WITH DIFFERENTIAL/PLATELET
Abs Immature Granulocytes: 0.07 10*3/uL (ref 0.00–0.07)
Basophils Absolute: 0 10*3/uL (ref 0.0–0.1)
Basophils Relative: 0 %
Eosinophils Absolute: 0 10*3/uL (ref 0.0–0.5)
Eosinophils Relative: 0 %
HCT: 31.7 % — ABNORMAL LOW (ref 36.0–46.0)
Hemoglobin: 10.1 g/dL — ABNORMAL LOW (ref 12.0–15.0)
Immature Granulocytes: 1 %
Lymphocytes Relative: 6 %
Lymphs Abs: 0.8 10*3/uL (ref 0.7–4.0)
MCH: 28.7 pg (ref 26.0–34.0)
MCHC: 31.9 g/dL (ref 30.0–36.0)
MCV: 90.1 fL (ref 80.0–100.0)
Monocytes Absolute: 0.9 10*3/uL (ref 0.1–1.0)
Monocytes Relative: 8 %
Neutro Abs: 10 10*3/uL — ABNORMAL HIGH (ref 1.7–7.7)
Neutrophils Relative %: 85 %
Platelets: 159 10*3/uL (ref 150–400)
RBC: 3.52 MIL/uL — ABNORMAL LOW (ref 3.87–5.11)
RDW: 12.4 % (ref 11.5–15.5)
WBC: 11.7 10*3/uL — ABNORMAL HIGH (ref 4.0–10.5)
nRBC: 0 % (ref 0.0–0.2)

## 2022-04-25 LAB — LACTIC ACID, PLASMA
Lactic Acid, Venous: 1 mmol/L (ref 0.5–1.9)
Lactic Acid, Venous: 1.5 mmol/L (ref 0.5–1.9)

## 2022-04-25 LAB — COMPREHENSIVE METABOLIC PANEL
ALT: 12 U/L (ref 0–44)
AST: 21 U/L (ref 15–41)
Albumin: 3.1 g/dL — ABNORMAL LOW (ref 3.5–5.0)
Alkaline Phosphatase: 68 U/L (ref 38–126)
Anion gap: 12 (ref 5–15)
BUN: 15 mg/dL (ref 8–23)
CO2: 17 mmol/L — ABNORMAL LOW (ref 22–32)
Calcium: 8.8 mg/dL — ABNORMAL LOW (ref 8.9–10.3)
Chloride: 100 mmol/L (ref 98–111)
Creatinine, Ser: 0.78 mg/dL (ref 0.44–1.00)
GFR, Estimated: >60 mL/min (ref >60)
Glucose, Bld: 140 mg/dL — ABNORMAL HIGH (ref 70–99)
Potassium: 3.9 mmol/L (ref 3.5–5.1)
Sodium: 129 mmol/L — ABNORMAL LOW (ref 135–145)
Total Bilirubin: 0.9 mg/dL (ref 0.3–1.2)
Total Protein: 6.8 g/dL (ref 6.5–8.1)

## 2022-04-25 LAB — TROPONIN I (HIGH SENSITIVITY)
Troponin I (High Sensitivity): 18 ng/L — ABNORMAL HIGH (ref <18)
Troponin I (High Sensitivity): 17 ng/L (ref <18)

## 2022-04-25 LAB — BASIC METABOLIC PANEL
Anion gap: 18 — ABNORMAL HIGH (ref 5–15)
BUN: 8 mg/dL (ref 8–23)
CO2: 22 mmol/L (ref 22–32)
Calcium: 9.9 mg/dL (ref 8.9–10.3)
Chloride: 96 mmol/L — ABNORMAL LOW (ref 98–111)
Creatinine, Ser: 0.58 mg/dL (ref 0.44–1.00)
GFR, Estimated: >60 mL/min (ref >60)
Glucose, Bld: 114 mg/dL — ABNORMAL HIGH (ref 70–99)
Potassium: 3.8 mmol/L (ref 3.5–5.1)
Sodium: 136 mmol/L (ref 135–145)

## 2022-04-25 LAB — URINALYSIS, ROUTINE W REFLEX MICROSCOPIC
Bilirubin Urine: NEGATIVE
Glucose, UA: NEGATIVE mg/dL
Ketones, ur: NEGATIVE mg/dL
Leukocytes,Ua: NEGATIVE
Nitrite: NEGATIVE
Protein, ur: 100 mg/dL — AB
Specific Gravity, Urine: 1.023 (ref 1.005–1.030)
pH: 5 (ref 5.0–8.0)

## 2022-04-25 LAB — STREP PNEUMONIAE URINARY ANTIGEN: Strep Pneumo Urinary Antigen: NEGATIVE

## 2022-04-25 LAB — PROTIME-INR
INR: 1.3 — ABNORMAL HIGH (ref 0.8–1.2)
Prothrombin Time: 16.2 seconds — ABNORMAL HIGH (ref 11.4–15.2)

## 2022-04-25 LAB — RESP PANEL BY RT-PCR (FLU A&B, COVID) ARPGX2
Influenza A by PCR: NEGATIVE
Influenza B by PCR: NEGATIVE
SARS Coronavirus 2 by RT PCR: NEGATIVE

## 2022-04-25 LAB — GLUCOSE, CAPILLARY: Glucose-Capillary: 110 mg/dL — ABNORMAL HIGH (ref 70–99)

## 2022-04-25 LAB — MRSA NEXT GEN BY PCR, NASAL: MRSA by PCR Next Gen: NOT DETECTED

## 2022-04-25 LAB — APTT: aPTT: 41 seconds — ABNORMAL HIGH (ref 24–36)

## 2022-04-25 LAB — BRAIN NATRIURETIC PEPTIDE: B Natriuretic Peptide: 119.7 pg/mL — ABNORMAL HIGH (ref 0.0–100.0)

## 2022-04-25 MED ORDER — ENOXAPARIN SODIUM 40 MG/0.4ML IJ SOSY
40.0000 mg | PREFILLED_SYRINGE | INTRAMUSCULAR | Status: DC
  Administered 2022-04-25 – 2022-04-27 (×3): 40 mg via SUBCUTANEOUS
  Filled 2022-04-25 (×3): qty 0.4

## 2022-04-25 MED ORDER — SODIUM CHLORIDE 0.9 % IV SOLN: 500.0000 mg | INTRAVENOUS | Status: DC

## 2022-04-25 MED ORDER — OXYCODONE HCL 5 MG PO TABS
5.0000 mg | ORAL_TABLET | Freq: Three times a day (TID) | ORAL | Status: DC | PRN
  Administered 2022-04-25 – 2022-04-28 (×7): 5 mg via ORAL
  Filled 2022-04-25 (×7): qty 1

## 2022-04-25 MED ORDER — ACETAMINOPHEN 325 MG PO TABS
650.0000 mg | ORAL_TABLET | Freq: Four times a day (QID) | ORAL | Status: DC | PRN
  Administered 2022-04-26 (×2): 650 mg via ORAL
  Filled 2022-04-25 (×2): qty 2

## 2022-04-25 MED ORDER — ORAL CARE MOUTH RINSE: 15.0000 mL | OROMUCOSAL | Status: DC | PRN

## 2022-04-25 MED ORDER — NALOXONE HCL 4 MG/0.1ML NA LIQD: 1.0000 | NASAL | Status: DC | PRN

## 2022-04-25 MED ORDER — HYDROMORPHONE HCL 1 MG/ML IJ SOLN
0.5000 mg | INTRAMUSCULAR | Status: DC | PRN
  Administered 2022-04-27: 1 mg via INTRAVENOUS
  Filled 2022-04-25: qty 1

## 2022-04-25 MED ORDER — OXYCODONE-ACETAMINOPHEN 10-325 MG PO TABS: 1.0000 | ORAL_TABLET | Freq: Three times a day (TID) | ORAL | Status: DC | PRN

## 2022-04-25 MED ORDER — SODIUM CHLORIDE 0.9 % IV SOLN
2.0000 g | Freq: Once | INTRAVENOUS | Status: AC
  Administered 2022-04-25: 2 g via INTRAVENOUS
  Filled 2022-04-25: qty 12.5

## 2022-04-25 MED ORDER — ACETAMINOPHEN 500 MG PO TABS
1000.0000 mg | ORAL_TABLET | Freq: Once | ORAL | Status: AC
  Administered 2022-04-25: 1000 mg via ORAL
  Filled 2022-04-25: qty 2

## 2022-04-25 MED ORDER — SODIUM CHLORIDE 0.9% FLUSH
3.0000 mL | Freq: Two times a day (BID) | INTRAVENOUS | Status: DC
  Administered 2022-04-25 – 2022-04-28 (×6): 3 mL via INTRAVENOUS

## 2022-04-25 MED ORDER — SERTRALINE HCL 25 MG PO TABS
25.0000 mg | ORAL_TABLET | Freq: Every day | ORAL | Status: DC
  Administered 2022-04-26 – 2022-04-28 (×3): 25 mg via ORAL
  Filled 2022-04-25 (×3): qty 1

## 2022-04-25 MED ORDER — IOHEXOL 350 MG/ML SOLN
75.0000 mL | Freq: Once | INTRAVENOUS | Status: AC | PRN
  Administered 2022-04-25: 75 mL via INTRAVENOUS

## 2022-04-25 MED ORDER — ALBUTEROL SULFATE (2.5 MG/3ML) 0.083% IN NEBU: 2.5000 mg | INHALATION_SOLUTION | RESPIRATORY_TRACT | Status: DC | PRN

## 2022-04-25 MED ORDER — SODIUM CHLORIDE 0.9 % IV SOLN: 2.0000 g | Freq: Three times a day (TID) | INTRAVENOUS | Status: DC

## 2022-04-25 MED ORDER — LEVOFLOXACIN IN D5W 750 MG/150ML IV SOLN: 750.0000 mg | Freq: Once | INTRAVENOUS | Status: DC

## 2022-04-25 MED ORDER — DOXYCYCLINE HYCLATE 100 MG PO TABS
100.0000 mg | ORAL_TABLET | Freq: Two times a day (BID) | ORAL | Status: DC
  Administered 2022-04-25 – 2022-04-28 (×6): 100 mg via ORAL
  Filled 2022-04-25 (×6): qty 1

## 2022-04-25 MED ORDER — OXYCODONE-ACETAMINOPHEN 5-325 MG PO TABS
1.0000 | ORAL_TABLET | Freq: Three times a day (TID) | ORAL | Status: DC | PRN
  Administered 2022-04-25 – 2022-04-28 (×8): 1 via ORAL
  Filled 2022-04-25 (×9): qty 1

## 2022-04-25 MED ORDER — FENTANYL CITRATE PF 50 MCG/ML IJ SOSY
25.0000 ug | PREFILLED_SYRINGE | Freq: Once | INTRAMUSCULAR | Status: AC
  Administered 2022-04-25: 25 ug via INTRAVENOUS
  Filled 2022-04-25: qty 1

## 2022-04-25 MED ORDER — SODIUM CHLORIDE 0.9 % IV SOLN
2.0000 g | INTRAVENOUS | Status: DC
  Administered 2022-04-25 – 2022-04-27 (×3): 2 g via INTRAVENOUS
  Filled 2022-04-25 (×3): qty 20

## 2022-04-25 MED ORDER — METRONIDAZOLE 500 MG/100ML IV SOLN
500.0000 mg | Freq: Once | INTRAVENOUS | Status: DC
  Filled 2022-04-25: qty 100

## 2022-04-25 MED ORDER — BUPRENORPHINE HCL 750 MCG BU FILM
ORAL_FILM | Freq: Two times a day (BID) | BUCCAL | Status: DC
  Filled 2022-04-25: qty 1

## 2022-04-25 MED ORDER — IOHEXOL 350 MG/ML SOLN
58.0000 mL | Freq: Once | INTRAVENOUS | Status: AC | PRN
  Administered 2022-04-25: 58 mL via INTRAVENOUS

## 2022-04-25 MED ORDER — ACETAMINOPHEN 650 MG RE SUPP: 650.0000 mg | Freq: Four times a day (QID) | RECTAL | Status: DC | PRN

## 2022-04-25 MED ORDER — LACTATED RINGERS IV BOLUS
1000.0000 mL | Freq: Once | INTRAVENOUS | Status: AC
  Administered 2022-04-25: 1000 mL via INTRAVENOUS

## 2022-04-25 MED ORDER — TIZANIDINE HCL 4 MG PO TABS
4.0000 mg | ORAL_TABLET | Freq: Every day | ORAL | Status: DC
  Administered 2022-04-25 – 2022-04-27 (×3): 4 mg via ORAL
  Filled 2022-04-25 (×3): qty 1

## 2022-04-25 MED ORDER — POLYETHYLENE GLYCOL 3350 17 G PO PACK: 17.0000 g | PACK | Freq: Every day | ORAL | Status: DC | PRN

## 2022-04-25 MED ORDER — INSULIN ASPART 100 UNIT/ML IJ SOLN
0.0000 [IU] | Freq: Three times a day (TID) | INTRAMUSCULAR | Status: DC
  Administered 2022-04-26 – 2022-04-27 (×2): 2 [IU] via SUBCUTANEOUS

## 2022-04-25 MED ORDER — ROSUVASTATIN CALCIUM 5 MG PO TABS
5.0000 mg | ORAL_TABLET | Freq: Every day | ORAL | Status: DC
  Administered 2022-04-26 – 2022-04-28 (×3): 5 mg via ORAL
  Filled 2022-04-25 (×3): qty 1

## 2022-04-25 NOTE — ED Notes (Signed)
MD Vanita Panda made aware of temp 102.7

## 2022-04-25 NOTE — H&P (Addendum)
History and Physical   Ashley Riddle WGY:[id number] DOB: 1945/07/23 DOA: 04/25/2022  PCP: Audley Hose, MD   Patient coming from: Home  Chief Complaint: Shortness of Breath  HPI: Ashley Riddle is a 76 y.o. female with medical history significant of cirrhosis, hepatitis C, chronic bronchitis, asthma, anxiety, depression, hypertension, chronic pain, history of substance use, diabetes, peripheral vascular disease, neuropathy, obesity presenting with shortness of breath.  Patient reporting several days of increased shortness of breath with changes in her sputum which is now reportedly darker and thicker than normal.  Yesterday she had some bloody streaks in her sputum as well. She has had 1 day of right chest wall pain worse with coughing or breathing deep as well. Reporting some urinary incontinence but no dysuria.  She denies fevers, chills, abdominal pain, constipation, diarrhea, nausea, vomiting.  ED Course: Vital signs in the ED significant for fever to 1-2.7, heart rate in the 90s to 100s, respiratory rate in the teens to 20s, blood pressure in the 779T to 903E systolic.  Lab workup included CMP with sodium 129, bicarb 17, glucose 140, calcium 8.8, albumin 3.1.  CBC with leukocytosis to 11.7 and hemoglobin stable at 10.1.  BNP mildly elevated at 119 with troponin initially borderline at 18 and normal on repeat.  Lactic acid pending.  Respiratory panel for flu and COVID negative.  Urinalysis with hemoglobin protein and rare bacteria only.  Urine culture and blood cultures pending.  CTA chest and CT abdomen pelvis in the ED showed large right lower lobe consolidation consistent with pneumonia, negative for PE, perinephric stranding of unclear etiology, cirrhosis with upper limit of normal spleen size, cardiomegaly and CAD.  Patient received Tylenol, fentanyl, cefepime, Flagyl, liter fluids in the ED.  Review of Systems: As per HPI otherwise all other systems reviewed and are  negative.  Past Medical History:  Diagnosis Date   Anxiety    Arthritis    CAP (community acquired pneumonia) 02/18/2017   Constipation    COPD (chronic obstructive pulmonary disease) (HCC)    DDD (degenerative disc disease), cervical    DDD (degenerative disc disease), lumbar    Gout    Heart murmur    probable bicuspid AV with mild AS, mild MR by 04/22/14 Echo (Dr. Montez Morita)   Hepatitis C    treated 2016    History of blood transfusion    Hypertension    Hypomagnesemia 03/02/2017   Pneumonia 12/02/2013   Sepsis ([place]) 04/19/2020    Past Surgical History:  Procedure Laterality Date   ABDOMINAL HYSTERECTOMY     APPENDECTOMY     BACK SURGERY     CESAREAN SECTION     x 3   COLONOSCOPY W/ POLYPECTOMY     HARDWARE REMOVAL Left 12/26/2015   Procedure: REMOVAL K-WIRE LEFT SCAPULA;  Surgeon: Garald Balding, MD;  Location: [facility];  Service: Orthopedics;  Laterality: Left;   INNER EAR SURGERY     blood vessel   TONSILLECTOMY     TOTAL SHOULDER ARTHROPLASTY Left 12/27/2014   Procedure: TOTAL SHOULDER ARTHROPLASTY;  Surgeon: Garald Balding, MD;  Location: [facility];  Service: Orthopedics;  Laterality: Left;    Social History  reports that she quit smoking about 16 years ago. Her smoking use included cigarettes. She has a 49.00 pack-year smoking history. She has never used smokeless tobacco. She reports that she does not drink alcohol and does not use drugs.  Allergies  Allergen Reactions   Gabapentin Other (See Comments)  Pregabalin     Other reaction(s): Confusion   Aspirin Other (See Comments)    Due to history of hepatitis   Penicillins Hives    Has patient had a PCN reaction causing immediate rash, facial/tongue/throat swelling, SOB or lightheadedness with hypotension: Yes Has patient had a PCN reaction causing severe rash involving mucus membranes or skin necrosis: No Has patient had a PCN reaction that required hospitalization No Has patient had a PCN reaction occurring  within the last 10 years: No If all of the above answers are "NO", then may proceed with Cephalosporin use.     Family History  Problem Relation Age of Onset   Heart disease Mother    Kidney disease Mother    Alcohol abuse Mother    Breast cancer Daughter    Breast cancer Cousin 52  Reviewed on admission  Prior to Admission medications   Medication Sig Start Date End Date Taking? Authorizing Provider  ACCU-CHEK GUIDE test strip  05/04/20   [provider]  acetaminophen (TYLENOL) 500 MG tablet Take 1,000 mg by mouth every 8 (eight) hours as needed for moderate pain.  12/16/19   [provider]  albuterol (PROVENTIL HFA;VENTOLIN HFA) 108 (90 Base) MCG/ACT inhaler Inhale 2 puffs into the lungs every 2 (two) hours as needed for wheezing or shortness of breath.     [provider]  aspirin 81 MG EC tablet aspirin 81 mg tablet,delayed release  TAKE 1 TABLET (81 MG TOTAL) BY MOUTH 2 TIMES DAILY FOR 30 DAYS.    [provider]  BELBUCA 600 MCG FILM Take 1 Film by mouth in the morning and at bedtime.  01/17/20   [provider]  brimonidine (ALPHAGAN) 0.2 % ophthalmic solution APPLY 1 DROP INTO LEFT EYE THREE TIMES A DAY START 48 HOURS PRIOR TO SURGERY.    [provider]  budesonide (PULMICORT) 0.5 MG/2ML nebulizer solution Inhale into the lungs. 04/21/18   [provider]  buprenorphine Haze Rushing) 20 MCG/HR PTWK buprenorphine 20 mcg/hour weekly transdermal patch Patient not taking: Reported on 04/08/2022    [provider]  Buprenorphine HCl (BELBUCA) 750 MCG FILM Place inside cheek. Patient not taking: Reported on 04/08/2022 08/27/21   [provider]  Calcium Carbonate-Vit D-Min (CALCIUM 600+D PLUS MINERALS) 600-400 MG-UNIT TABS Calcium 600 + D(3) 600 mg-10 mcg (400 unit) tablet  Take 1 tablet twice a day by oral route for 30 days.    [provider]  colchicine 0.6 MG tablet Take 0.6 mg by mouth daily as  needed (gout flares).     [provider]  Cyanocobalamin (B-12) 5000 MCG SUBL B12  take one tab per day    [provider]  Cyanocobalamin (VITAMIN B-12 PO) Take 1 tablet by mouth daily. Patient not taking: Reported on 04/08/2022    [provider]  cyclobenzaprine (FLEXERIL) 5 MG tablet Take 5 mg by mouth daily as needed for muscle spasms.     [provider]  diclofenac Sodium (VOLTAREN) 1 % GEL diclofenac 1 % topical gel  APPLY 2 GRAMS TO THE AFFECTED AREA(S) BY TOPICAL ROUTE 4 TIMES PER DAY    [provider]  DULoxetine (CYMBALTA) 30 MG capsule duloxetine 30 mg capsule,delayed release  Take 1 capsule by mouth every day in the evening    [provider]  ipratropium-albuterol (DUONEB) 0.5-2.5 (3) MG/3ML SOLN Inhale into the lungs.    [provider]  JANUVIA 25 MG tablet Take 25 mg by mouth  every morning. 11/16/20   [provider]  losartan (COZAAR) 100 MG tablet Take 1 tablet by mouth daily.    [provider]  metFORMIN (GLUCOPHAGE-XR) 500 MG 24 hr tablet Take 1 tablet by mouth daily. 12/07/19   [provider]  Multiple Vitamin (MULTIVITAMIN WITH MINERALS) TABS tablet Take 1 tablet by mouth daily.    [provider]  naloxone [facility]) nasal spray 4 mg/0.1 mL PLEASE SEE ATTACHED FOR DETAILED DIRECTIONS Patient not taking: Reported on 04/08/2022 03/01/21   [provider]  naloxone [facility]) nasal spray 4 mg/0.1 mL PLEASE SEE ATTACHED FOR DETAILED DIRECTIONS 03/01/21   [provider]  Oxycodone HCl 10 MG TABS PLEASE SEE ATTACHED FOR DETAILED DIRECTIONS    [provider]  oxyCODONE-acetaminophen (PERCOCET) 10-325 MG tablet Take 1 tablet by mouth every 4 (four) hours as needed for pain.    [provider]  rosuvastatin (CRESTOR) 5 MG tablet Take 5 mg by mouth daily. 12/07/19   [provider]  sertraline (ZOLOFT) 50 MG tablet Take 25 mg by mouth daily.   10/15/18   [provider]  tiotropium (SPIRIVA) 18 MCG inhalation capsule Place 18 mcg into inhaler and inhale daily as needed (shortness of breath).     [provider]  tiZANidine (ZANAFLEX) 2 MG tablet Take 1 tablet (2 mg total) by mouth every 6 (six) hours as needed for muscle spasms. Patient not taking: Reported on 04/08/2022 10/02/18   Long, Wonda Olds, MD  tiZANidine (ZANAFLEX) 4 MG tablet tizanidine 4 mg tablet  TAKE 1 TABLET BY MOUTH EVERYDAY AT BEDTIME 08/14/20   [provider]  valsartan-hydrochlorothiazide (DIOVAN-HCT) 320-12.5 MG tablet valsartan 320 mg-hydrochlorothiazide 12.5 mg tablet    [provider]    Physical Exam: Vitals:   04/25/22 1600 04/25/22 1700 04/25/22 1715 04/25/22 1719  BP: (!) 144/80 (!) 151/78 (!) 140/86   Pulse: (!) 106 (!) 106 (!) 106   Resp: (!) 28 (!) 21 19   Temp:    100.2 F (37.9 C)  TempSrc:    Oral  SpO2: 96% 93% 96%     Physical Exam Constitutional:      General: She is not in acute distress.    Appearance: Normal appearance.  HENT:     Head: Normocephalic and atraumatic.     Mouth/Throat:     Mouth: Mucous membranes are moist.     Pharynx: Oropharynx is clear.  Eyes:     Extraocular Movements: Extraocular movements intact.     Pupils: Pupils are equal, round, and reactive to light.  Cardiovascular:     Rate and Rhythm: Regular rhythm. Tachycardia present.     Pulses: Normal pulses.     Heart sounds: Normal heart sounds.  Pulmonary:     Effort: Pulmonary effort is normal. No respiratory distress.     Breath sounds: Examination of the right-upper field reveals decreased breath sounds. Decreased breath sounds present.  Abdominal:     General: Bowel sounds are normal. There is no distension.     Palpations: Abdomen is soft.     Tenderness: There is no abdominal tenderness.  Musculoskeletal:        General: No swelling or deformity.  Skin:    General: Skin is warm and dry.  Neurological:      General: No focal deficit present.     Mental Status: Mental status is at baseline.    Labs on Admission: I have personally reviewed following labs and imaging studies  CBC: Recent Labs  Lab 04/25/22 1151  WBC 11.7*  NEUTROABS 10.0*  HGB 10.1*  HCT 31.7*  MCV 90.1  PLT 528    Basic Metabolic Panel: Recent Labs  Lab 04/25/22 1236  NA 129*  K 3.9  CL 100  CO2 17*  GLUCOSE 140*  BUN 15  CREATININE 0.78  CALCIUM 8.8*    GFR: CrCl cannot be calculated (Unknown ideal weight.).  Liver Function Tests: Recent Labs  Lab 04/25/22 1236  AST 21  ALT 12  ALKPHOS 68  BILITOT 0.9  PROT 6.8  ALBUMIN 3.1*    Urine analysis:    Component Value Date/Time   COLORURINE YELLOW 04/25/2022 1224   APPEARANCEUR CLEAR 04/25/2022 1224   LABSPEC 1.023 04/25/2022 1224   PHURINE 5.0 04/25/2022 1224   GLUCOSEU NEGATIVE 04/25/2022 1224   HGBUR MODERATE (A) 04/25/2022 1224   BILIRUBINUR NEGATIVE 04/25/2022 1224   KETONESUR NEGATIVE 04/25/2022 1224   PROTEINUR 100 (A) 04/25/2022 1224   UROBILINOGEN 0.2 08/20/2014 0554   NITRITE NEGATIVE 04/25/2022 1224   LEUKOCYTESUR NEGATIVE 04/25/2022 1224    Radiological Exams on Admission: CT Angio Chest PE W and/or Wo Contrast  Result Date: 04/25/2022 CLINICAL DATA:  76 year old female with chest, abdominal and pelvic pain and possible sepsis. EXAM: CT ABDOMEN AND PELVIS WITHOUT CONTRAST CT ANGIOGRAPHY CHEST TECHNIQUE: Multidetector CT imaging of the abdomen and pelvis was performed using the standard protocol without intravenous contrast. Multidetector CT imaging of the chest was performed using the standard protocol during bolus administration of intravenous contrast. Multiplanar CT image reconstructions and MIPs were obtained to evaluate the vascular anatomy. RADIATION DOSE REDUCTION: This exam was performed according to the departmental dose-optimization program which includes automated exposure control, adjustment of the mA and/or kV  according to patient size and/or use of iterative reconstruction technique. CONTRAST:  27m OMNIPAQUE IOHEXOL 350 MG/ML SOLN COMPARISON:  04/19/2020 CTs FINDINGS: CTA CHEST FINDINGS Cardiovascular: This is a technically satisfactory study but respiratory motion artifact decreases sensitivity and some portions of the lungs. No pulmonary emboli are identified. Mild cardiomegaly is present. Coronary artery and aortic atherosclerotic calcifications noted. There is no evidence of thoracic aortic aneurysm or pericardial effusion. Mediastinum/Nodes: No enlarged mediastinal, hilar, or axillary lymph nodes. Thyroid gland, trachea, and esophagus demonstrate no significant findings. Lungs/Pleura: Consolidation throughout a large portion of the RIGHT LOWER lobe is noted, likely representing pneumonia. No other airspace disease, consolidation, pleural effusion or pneumothorax noted. Musculoskeletal: No acute or suspicious bony abnormalities are noted. LEFT shoulder arthroplasty changes noted. Review of the MIP images confirms the above findings. CT ABDOMEN and PELVIS FINDINGS Please note that parenchymal and vascular abnormalities may be missed as intravenous contrast was not administered. Hepatobiliary: Cirrhosis identified without definite focal hepatic lesion. The gallbladder is unremarkable. There is no evidence of intrahepatic or extrahepatic biliary dilatation. Pancreas: Unremarkable Spleen: UPPER limits normal spleen size noted. Adrenals/Urinary Tract: Mild perinephric stranding is noted of uncertain chronicity and etiology. No other renal, adrenal or bladder abnormalities identified. There is no evidence of hydronephrosis. Stomach/Bowel: Stomach is within normal limits. No evidence of bowel wall thickening, distention, or inflammatory changes. Vascular/Lymphatic: Aortic atherosclerosis. No enlarged abdominal or pelvic lymph nodes. Reproductive: Status post hysterectomy. No adnexal masses. Other: No ascites, focal  collection or pneumoperitoneum. Musculoskeletal: No acute or suspicious bony abnormalities are noted. Degenerative changes in the LOWER lumbar spine again noted. Review of the MIP images confirms the above findings. IMPRESSION: 1. Large area of RIGHT LOWER lobe consolidation, likely representing pneumonia. Radiographic  follow-up to resolution is recommended. 2. No evidence of pulmonary emboli or thoracic aortic aneurysm. 3. Mild perinephric stranding of uncertain chronicity and etiology, and may represent inflammation or infection. No evidence of hydronephrosis. 4. Cirrhosis and UPPER limits normal spleen size. 5. Cardiomegaly and coronary artery disease. 6.  Aortic aneurysm NOS (ICD10-I71.9). Electronically Signed   By: Margarette Canada M.D.   On: 04/25/2022 17:15   CT Renal Stone Study  Result Date: 04/25/2022 CLINICAL DATA:  76 year old female with chest, abdominal and pelvic pain and possible sepsis. EXAM: CT ABDOMEN AND PELVIS WITHOUT CONTRAST CT ANGIOGRAPHY CHEST TECHNIQUE: Multidetector CT imaging of the abdomen and pelvis was performed using the standard protocol without intravenous contrast. Multidetector CT imaging of the chest was performed using the standard protocol during bolus administration of intravenous contrast. Multiplanar CT image reconstructions and MIPs were obtained to evaluate the vascular anatomy. RADIATION DOSE REDUCTION: This exam was performed according to the departmental dose-optimization program which includes automated exposure control, adjustment of the mA and/or kV according to patient size and/or use of iterative reconstruction technique. CONTRAST:  73m OMNIPAQUE IOHEXOL 350 MG/ML SOLN COMPARISON:  04/19/2020 CTs FINDINGS: CTA CHEST FINDINGS Cardiovascular: This is a technically satisfactory study but respiratory motion artifact decreases sensitivity and some portions of the lungs. No pulmonary emboli are identified. Mild cardiomegaly is present. Coronary artery and aortic  atherosclerotic calcifications noted. There is no evidence of thoracic aortic aneurysm or pericardial effusion. Mediastinum/Nodes: No enlarged mediastinal, hilar, or axillary lymph nodes. Thyroid gland, trachea, and esophagus demonstrate no significant findings. Lungs/Pleura: Consolidation throughout a large portion of the RIGHT LOWER lobe is noted, likely representing pneumonia. No other airspace disease, consolidation, pleural effusion or pneumothorax noted. Musculoskeletal: No acute or suspicious bony abnormalities are noted. LEFT shoulder arthroplasty changes noted. Review of the MIP images confirms the above findings. CT ABDOMEN and PELVIS FINDINGS Please note that parenchymal and vascular abnormalities may be missed as intravenous contrast was not administered. Hepatobiliary: Cirrhosis identified without definite focal hepatic lesion. The gallbladder is unremarkable. There is no evidence of intrahepatic or extrahepatic biliary dilatation. Pancreas: Unremarkable Spleen: UPPER limits normal spleen size noted. Adrenals/Urinary Tract: Mild perinephric stranding is noted of uncertain chronicity and etiology. No other renal, adrenal or bladder abnormalities identified. There is no evidence of hydronephrosis. Stomach/Bowel: Stomach is within normal limits. No evidence of bowel wall thickening, distention, or inflammatory changes. Vascular/Lymphatic: Aortic atherosclerosis. No enlarged abdominal or pelvic lymph nodes. Reproductive: Status post hysterectomy. No adnexal masses. Other: No ascites, focal collection or pneumoperitoneum. Musculoskeletal: No acute or suspicious bony abnormalities are noted. Degenerative changes in the LOWER lumbar spine again noted. Review of the MIP images confirms the above findings. IMPRESSION: 1. Large area of RIGHT LOWER lobe consolidation, likely representing pneumonia. Radiographic follow-up to resolution is recommended. 2. No evidence of pulmonary emboli or thoracic aortic aneurysm.  3. Mild perinephric stranding of uncertain chronicity and etiology, and may represent inflammation or infection. No evidence of hydronephrosis. 4. Cirrhosis and UPPER limits normal spleen size. 5. Cardiomegaly and coronary artery disease. 6.  Aortic aneurysm NOS (ICD10-I71.9). Electronically Signed   By: JMargarette CanadaM.D.   On: 04/25/2022 17:15    EKG: Independently reviewed.  Sinus rhythm at 99 bpm.  PACs noted.  Nonspecific T wave flattening.  Assessment/Plan Principal Problem:   Pneumonia Active Problems:   HEPATITIS C   HYPERTENSION, BENIGN ESSENTIAL   Chronic pain syndrome   Liver cirrhosis (HCC)   Anxiety and depression   DM (diabetes mellitus) (  [place])   Asthma   Chronic bronchitis ([place])   Peripheral vascular disease ([place])   Obesity   Pneumonia > Patient presenting with shortness of breath with darker and thicker sputum production as well as blood streaks in her sputum. > Workup in the ED revealed leukocytosis and imaging consistent with pneumonia.  She does have some right flank pain with nonspecific perinephric stranding but urinalysis with rare bacteria only and no dysuria despite some urinary incontinence.  Urine cultures pending. > Blood cultures also pending.  Patient received cefepime in the ED. > Patient is borderline for sepsis criteria she did have fever to 102.7 and has had tachycardia and tachypnea though she is experiencing pain and intermittent fever. - Monitor on telemetry unit - Switch antibiotics to ceftriaxone and doxycycline (QTc prolonged) - Trend fever curve and WBC - Strep urinary antigen and Legionella urinary antigen - Follow-up urine culture and blood culture - Trend lactic acid  Hyponatremia > Sodium 129 with normal baseline.  Likely hypovolemic hyponatremia. - Will check urine sodium, urine osmolality, serum osmolality - Recheck BMP now and trend every 6 hours - Hold off on further IV fluids for now  Chronic bronchitis Asthma > No wheezing to  indicate exacerbation at this time.  She states pulmonologist put her maintenance inhalers on hold while the plan for PFTs. - Continue to hold maintenance inhalers - As needed albuterol  Cirrhosis History of hepatitis C > Known history of this not currently on any diuretics.  Imaging redemonstrated cirrhosis and spleen size upper limit of normal. - Continue to monitor  Anxiety Depression - Continue home sertraline  Hypertension > Will need to confirm home regimen  Chronic pain - Continue home buprenorphine and oxycodone - As needed Dilaudid for severe breakthrough pain in the acute setting  Diabetes - SSI  Peripheral vascular disease - Continue home rosuvastatin  Obesity - Noted  DVT prophylaxis: Lovenox Code Status:   Full Family Communication:  None on admission  Disposition Plan:   Patient is from:  Home  Anticipated DC to:  Home  Anticipated DC date:  3 days  Anticipated DC barriers: None  Consults called:  None Admission status:  Observation, telemetry  Severity of Illness: The appropriate patient status for this patient is OBSERVATION. Observation status is judged to be reasonable and necessary in order to provide the required intensity of service to ensure the patient's safety. The patient's presenting symptoms, physical exam findings, and initial radiographic and laboratory data in the context of their medical condition is felt to place them at decreased risk for further clinical deterioration. Furthermore, it is anticipated that the patient will be medically stable for discharge from the hospital within 2 midnights of admission.    Marcelyn Bruins MD [facility]  How to contact the [facility] Attending or Consulting provider [place] or covering provider during after hours [place], for this patient?   Check the care team in [facility] and look for a) attending/consulting [facility] provider listed and b) the [facility] team listed Log into [email or web address] and use [place]'s  universal password to access. If you do not have the password, please contact the hospital operator. Locate the [facility] provider you are looking for under [facility] and page to a number that you can be directly reached. If you still have difficulty reaching the provider, please page the [facility] (Director on Call) for the Hospitalists listed on amion for assistance.  04/25/2022, 6:19 PM

## 2022-04-25 NOTE — Progress Notes (Signed)
Pharmacy Antibiotic Note  Ashley Riddle is a 76 y.o. female admitted on 04/25/2022 with  sepsis c/f urinary vs intra-abdominal source .  Pharmacy has been consulted for cefepime dosing. Pt has reported PCN allergy - received cefdinir (2021) and CTX (2019) in past. WBC 11.7/Tmax 102.1F  CrCl 64 mL/min Weight 91.9 kg (04/22/22) per OSH  Plan: Cefepime 2g IV q8h  Flagyl per MD F/u culture data, imaging studies, clinical picture, renal func    Temp (24hrs), Avg:100.3 F (37.9 C), Min:97.8 F (36.6 C), Max:102.7 F (39.3 C)  Recent Labs  Lab 04/25/22 1151 04/25/22 1236  WBC 11.7*  --   CREATININE  --  0.78    CrCl cannot be calculated (Unknown ideal weight.).    Allergies  Allergen Reactions   Gabapentin Other (See Comments)   Pregabalin     Other reaction(s): Confusion   Aspirin Other (See Comments)    Due to history of hepatitis   Penicillins Hives    Has patient had a PCN reaction causing immediate rash, facial/tongue/throat swelling, SOB or lightheadedness with hypotension: Yes Has patient had a PCN reaction causing severe rash involving mucus membranes or skin necrosis: No Has patient had a PCN reaction that required hospitalization No Has patient had a PCN reaction occurring within the last 10 years: No If all of the above answers are "NO", then may proceed with Cephalosporin use.     Antimicrobials this admission: Flagyl 11/23> Cefepime 11/23>   Dose adjustments this admission:   Microbiology results: 11/23 RVP:   Thank you for allowing pharmacy to be a part of this patient's care.  Carolin Guernsey 04/25/2022 4:42 PM

## 2022-04-25 NOTE — ED Provider Notes (Signed)
This patient is a 76 year old female presenting with fever, tachycardia, feeling of some chest discomfort.  She does cough occasionally and on exam has some decreased lung sounds on the right.  At change of shift this patient was signed out for further evaluation and work-up of the fever.  CT scan of the chest reveals a dense right lower lobe infiltrate, chest x-ray also confirms this.  COVID and flu negative.  Will discuss with admitting team for admission.  Antibiotics given.  Lactate and cultures requested  Patient is critically ill but improving  .Critical Care  Performed by: Noemi Chapel, MD Authorized by: Noemi Chapel, MD   Critical care provider statement:    Critical care time (minutes):  30   Critical care time was exclusive of:  Separately billable procedures and treating other patients and teaching time   Critical care was necessary to treat or prevent imminent or life-threatening deterioration of the following conditions:  Respiratory failure and sepsis   Critical care was time spent personally by me on the following activities:  Development of treatment plan with patient or surrogate, discussions with consultants, evaluation of patient's response to treatment, examination of patient, ordering and review of laboratory studies, ordering and review of radiographic studies, ordering and performing treatments and interventions, pulse oximetry, re-evaluation of patient's condition, review of old charts and obtaining history from patient or surrogate   I assumed direction of critical care for this patient from another provider in my specialty: yes     Care discussed with: admitting provider   Comments:        Final diagnoses:  Acute right flank pain  Sepsis, due to unspecified organism, unspecified whether acute organ dysfunction present [facility])  Pneumonia of right lower lobe due to infectious organism      Noemi Chapel, MD 04/25/22 1739

## 2022-04-25 NOTE — ED Provider Notes (Addendum)
[facility] EMERGENCY DEPARTMENT Provider Note   CSN: [id number] Arrival date & time: 04/25/22  1038     History  Chief Complaint  Patient presents with   Shortness of Breath   Fall    Ashley Riddle is a 76 y.o. female.  HPI   The patient is a 76 year old female past medical history of COPD presenting for evaluation of shortness of breath, hemoptysis, and right flank pain.  The patient states that she has had progressively worsening shortness of breath over the past few days with sputum changes.  She states her sputum has become darker in color and thicker than usual.  Yesterday she developed blood-streaked hemoptysis which she has never had before.  She is also endorsing associated substernal chest pain without radiation.  The patient has no associated fevers, nausea, vomiting.  Regarding the patient's flank pain.  She is endorsing right flank pain which also began yesterday.  She has had urinary incontinence for the past 3 to 4 days without associated dysuria or malodorous urine.  She denies lower extremity swelling.   Home Medications Prior to Admission medications   Medication Sig Start Date End Date Taking? Authorizing Provider  ACCU-CHEK GUIDE test strip  05/04/20   [provider]  acetaminophen (TYLENOL) 500 MG tablet Take 1,000 mg by mouth every 8 (eight) hours as needed for moderate pain.  12/16/19   [provider]  albuterol (PROVENTIL HFA;VENTOLIN HFA) 108 (90 Base) MCG/ACT inhaler Inhale 2 puffs into the lungs every 2 (two) hours as needed for wheezing or shortness of breath.     [provider]  aspirin 81 MG EC tablet aspirin 81 mg tablet,delayed release  TAKE 1 TABLET (81 MG TOTAL) BY MOUTH 2 TIMES DAILY FOR 30 DAYS.    [provider]  azithromycin (ZITHROMAX) 250 MG tablet azithromycin 250 mg tablet  TAKE 1 TABLET BY MOUTH EVERY EVENING X 2 DAYS Patient not taking: Reported on 04/08/2022    [provider]  BELBUCA 600 MCG FILM Take 1 Film by mouth in the morning and at bedtime.  01/17/20   [provider]  brimonidine (ALPHAGAN) 0.2 % ophthalmic solution APPLY 1 DROP INTO LEFT EYE THREE TIMES A DAY START 48 HOURS PRIOR TO SURGERY.    [provider]  budesonide (PULMICORT) 0.5 MG/2ML nebulizer solution Inhale into the lungs. 04/21/18   [provider]  buprenorphine Haze Rushing) 20 MCG/HR PTWK buprenorphine 20 mcg/hour weekly transdermal patch Patient not taking: Reported on 04/08/2022    [provider]  Buprenorphine HCl (BELBUCA) 750 MCG FILM Place inside cheek. Patient not taking: Reported on 04/08/2022 08/27/21   [provider]  Calcium Carbonate-Vit D-Min (CALCIUM 600+D PLUS MINERALS) 600-400 MG-UNIT TABS Calcium 600 + D(3) 600 mg-10 mcg (400 unit) tablet  Take 1 tablet twice a day by oral route for 30 days.    [provider]  Calcium Carbonate-Vitamin D 600-400 MG-UNIT tablet Take 1 tablet by mouth daily.  Patient not taking: Reported on 04/08/2022    [provider]  Calcium Carbonate-Vitamin D 600-400 MG-UNIT tablet Calcium 600 + D(3) 600 mg-10 mcg (400 unit) tablet  Take 1 tablet twice a day by oral route for 30 days. Patient not taking: Reported on 04/08/2022    [provider]  cefadroxil (DURICEF) 500 MG capsule cefadroxil 500 mg capsule  TAKE 1 CAPSULE (500 MG TOTAL) BY MOUTH 2 TIMES DAILY FOR 7 DAYS. Patient not taking: Reported on 04/08/2022  [provider]  cefdinir (OMNICEF) 300 MG capsule cefdinir 300 mg capsule  TAKE 1 CAPSULE BY MOUTH EVERY 12 HOURS X 7DOSES Patient not taking: Reported on 04/08/2022    [provider]  clotrimazole-betamethasone ([place]) cream APPLY TO AFFECTED AREA TWICE A DAY FOR 2 WEEKS Patient not taking: Reported on 04/08/2022 08/03/20   [provider]  colchicine 0.6 MG tablet Take 0.6 mg by mouth daily as needed (gout flares).     [provider]  Cyanocobalamin (B-12) 5000 MCG SUBL B12  take one tab per day    [provider]  Cyanocobalamin (VITAMIN B-12 PO) Take 1 tablet by mouth daily. Patient not taking: Reported on 04/08/2022    [provider]  cyclobenzaprine (FLEXERIL) 5 MG tablet Take 5 mg by mouth daily as needed for muscle spasms.     [provider]  diclofenac Sodium (VOLTAREN) 1 % GEL diclofenac 1 % topical gel  APPLY 2 GRAMS TO THE AFFECTED AREA(S) BY TOPICAL ROUTE 4 TIMES PER DAY    [provider]  doxycycline (VIBRAMYCIN) 100 MG capsule doxycycline hyclate 100 mg capsule  TAKE 1 CAPSULE BY MOUTH TWICE A DAY FOR 7 DAYS Patient not taking: Reported on 04/08/2022    [provider]  DULoxetine (CYMBALTA) 30 MG capsule duloxetine 30 mg capsule,delayed release  Take 1 capsule by mouth every day in the evening    [provider]  ipratropium-albuterol (DUONEB) 0.5-2.5 (3) MG/3ML SOLN Inhale into the lungs.    [provider]  JANUVIA 25 MG tablet Take 25 mg by mouth every morning. 11/16/20   [provider]  losartan (COZAAR) 100 MG tablet Take 1 tablet by mouth daily.    [provider]  losartan-hydrochlorothiazide (HYZAAR) 100-25 MG tablet Take 1 tablet by mouth at bedtime.  Patient not taking: Reported on 04/08/2022 06/12/17   [provider]  metFORMIN (GLUCOPHAGE-XR) 500 MG 24 hr tablet Take 1 tablet by mouth daily. 12/07/19   [provider]  methylPREDNISolone (MEDROL DOSEPAK) 4 MG TBPK tablet See admin instructions. Patient not taking: Reported on 04/08/2022 12/20/20   [provider]  Multiple Vitamin (MULTIVITAMIN WITH MINERALS) TABS tablet Take 1 tablet by mouth daily.    [provider]  naloxone [facility]) nasal spray 4 mg/0.1 mL PLEASE SEE ATTACHED FOR DETAILED DIRECTIONS Patient not taking: Reported on 04/08/2022 03/01/21   [provider]  naloxone [facility]) nasal spray 4 mg/0.1  mL PLEASE SEE ATTACHED FOR DETAILED DIRECTIONS 03/01/21   [provider]  Oxycodone HCl 10 MG TABS PLEASE SEE ATTACHED FOR DETAILED DIRECTIONS    [provider]  oxyCODONE-acetaminophen (PERCOCET) 10-325 MG tablet Take 1 tablet by mouth every 4 (four) hours as needed for pain.    [provider]  rosuvastatin (CRESTOR) 5 MG tablet Take 5 mg by mouth daily. 12/07/19   [provider]  sertraline (ZOLOFT) 50 MG tablet Take 25 mg by mouth daily.  10/15/18   [provider]  tiotropium (SPIRIVA) 18 MCG inhalation capsule Place 18 mcg into inhaler and inhale daily as needed (shortness of breath).     [provider]  tiZANidine (ZANAFLEX) 2 MG tablet Take 1 tablet (2 mg total) by mouth every 6 (six) hours as needed for muscle spasms. Patient not taking: Reported on 04/08/2022 10/02/18   Long, Wonda Olds, MD  tiZANidine (ZANAFLEX) 4 MG tablet tizanidine 4 mg tablet  TAKE 1 TABLET BY MOUTH EVERYDAY AT BEDTIME 08/14/20  [provider]  valsartan-hydrochlorothiazide (DIOVAN-HCT) 320-12.5 MG tablet valsartan 320 mg-hydrochlorothiazide 12.5 mg tablet    [provider]      Allergies    Gabapentin, Pregabalin, Aspirin, and Penicillins    Review of Systems   Review of Systems See HPI  Physical Exam Updated Vital Signs BP (!) 147/71   Pulse (!) 103   Temp 97.8 F (36.6 C) (Axillary)   Resp (!) 25   SpO2 98%  Physical Exam Vitals and nursing note reviewed.  Constitutional:      General: She is not in acute distress.    Appearance: She is well-developed.  HENT:     Head: Normocephalic and atraumatic.  Eyes:     Conjunctiva/sclera: Conjunctivae normal.  Cardiovascular:     Rate and Rhythm: Normal rate and regular rhythm.     Heart sounds: No murmur heard. Pulmonary:     Effort: Pulmonary effort is normal. No accessory muscle usage or respiratory distress.     Breath sounds: Normal breath sounds.  Abdominal:     Palpations:  Abdomen is soft.     Tenderness: There is no abdominal tenderness.     Comments: Right-sided CVA tenderness  Musculoskeletal:        General: No swelling. Normal range of motion.     Cervical back: Neck supple.     Right lower leg: No edema.     Left lower leg: No edema.  Skin:    General: Skin is warm and dry.     Capillary Refill: Capillary refill takes less than 2 seconds.  Neurological:     Mental Status: She is alert.  Psychiatric:        Mood and Affect: Mood normal.     ED Results / Procedures / Treatments   Labs (all labs ordered are listed, but only abnormal results are displayed) Labs Reviewed  CBC WITH DIFFERENTIAL/PLATELET - Abnormal; Notable for the following components:      Result Value   WBC 11.7 (*)    RBC 3.52 (*)    Hemoglobin 10.1 (*)    HCT 31.7 (*)    Neutro Abs 10.0 (*)    All other components within normal limits  BRAIN NATRIURETIC PEPTIDE - Abnormal; Notable for the following components:   B Natriuretic Peptide 119.7 (*)    All other components within normal limits  URINALYSIS, ROUTINE W REFLEX MICROSCOPIC - Abnormal; Notable for the following components:   Hgb urine dipstick MODERATE (*)    Protein, ur 100 (*)    Bacteria, UA RARE (*)    All other components within normal limits  COMPREHENSIVE METABOLIC PANEL - Abnormal; Notable for the following components:   Sodium 129 (*)    CO2 17 (*)    Glucose, Bld 140 (*)    Calcium 8.8 (*)    Albumin 3.1 (*)    All other components within normal limits  TROPONIN I (HIGH SENSITIVITY) - Abnormal; Notable for the following components:   Troponin I (High Sensitivity) 18 (*)    All other components within normal limits  RESP PANEL BY RT-PCR (FLU A&B, COVID) [id number]  TROPONIN I (HIGH SENSITIVITY)    EKG EKG Interpretation  Date/Time:  Thursday April 25 2022 11:20:15 EST Ventricular Rate:  99 PR Interval:  152 QRS Duration: 77 QT Interval:  391 QTC Calculation: 502 R Axis:   22 Text  Interpretation: Sinus tachycardia Premature atrial complexes Abnormal ECG Confirmed by Carmin Muskrat [phone]) on 04/25/2022 3:06:19 PM  Radiology No results found.  Procedures Procedures    Medications Ordered in ED Medications  lactated ringers bolus 1,000 mL (1,000 mLs Intravenous New Bag/Given 04/25/22 1544)    ED Course/ Medical Decision Making/ A&P Clinical Course as of 04/25/22 1634  Thu Apr 25, 2022  1620 S - PMH COPD, here w/ hemoptysis and R flank pain for 1 day, multiple days of decreased PO intake and nausea, endorses sputum changes. Febrile here, tachycardic. No O2 at baseline, not on O2 here. No increased WOB. CVA tenderness on exam. UA okay. CTA -PE study and stone study pending. EKG ok.  [AW]    Clinical Course User Index [AW] Luster Landsberg, MD                           Medical Decision Making The patient is a 76 year old female past medical history of COPD presenting for evaluation of shortness of breath, hemoptysis, and right flank pain.  The differential diagnosis considered includes: ACS, PE, pneumonia, viral URI, COPD exacerbation, CHF, UTI, sepsis.  The differential for the patient's flank pain includes: Nephrolithiasis, pyelonephritis, cystitis, cholecystitis, appendicitis, SBO, constipation, IBD, costochondritis.  On initial evaluation, the patient was tachycardic with heart rate around 106 but hemodynamically stable.  She was noted to be mildly tachypneic with a respiratory rate of 21 without associated increased work of breathing or hypoxia.  On physical exam, the patient's lungs were clear to auscultation in all fields bilaterally and there was no sign of active bleeding in the posterior oropharynx.  The patient's diagnostic workup consisted of CBC with white blood cell count elevated to 11.7 hemoglobin of 10.1; CMP with sodium of 129, bicarb 17, glucose 140; BNP 119; urinalysis with rare bacteria; RVP which was negative; and serial high sensitive troponin 18  and 17 respectively.  The patient also received a EKG which showed sinus tachycardia with occasional PACs but no ischemic changes.  A CTA PE study, CT renal stone study were ordered which were pending at time of signout.  Care was transferred to the oncoming provider with plan to follow-up on the patient's imaging and discharge if negative.  Around the time of signout, the patient became febrile with a temperature of 102.7 F.  A CT abdomen pelvis was added for fever of unknown source.  A COVID and influenza test was also ordered and patient was started on broad-spectrum antibiotics.  She was given a dose of Tylenol for fever.  Amount and/or Complexity of Data Reviewed Independent Historian: caregiver    Details: Daughter External Data Reviewed: labs and notes. Labs: ordered. Decision-making details documented in ED Course. Radiology: ordered.  Risk OTC drugs. Prescription drug management.   Patient's presentation is most consistent with acute complicated illness / injury requiring diagnostic workup.         Final Clinical Impression(s) / ED Diagnoses Final diagnoses:  Acute right flank pain    Rx / DC Orders ED Discharge Orders     None         Dani Gobble, MD 04/25/22 1544    Dani Gobble, MD 04/25/22 1545    Dani Gobble, MD 04/25/22 1636    Carmin Muskrat, MD 04/26/22 [phone]

## 2022-04-25 NOTE — ED Triage Notes (Signed)
Pt BIB [facility] from home d/t SOB that started on Wednesday. Hx copd. Doe not wear oxygen at home. Expiratory Wheezing bilaterally.  A&O X4.

## 2022-04-25 NOTE — Sepsis Progress Note (Signed)
Elink following code sepsis °

## 2022-04-25 NOTE — ED Notes (Addendum)
Patient's family made aware of the need to bring the buccal medication from home as they do not have it.  Patient and family verbalized understanding.

## 2022-04-26 DIAGNOSIS — F419 Anxiety disorder, unspecified: Secondary | ICD-10-CM | POA: Diagnosis present

## 2022-04-26 DIAGNOSIS — E1151 Type 2 diabetes mellitus with diabetic peripheral angiopathy without gangrene: Secondary | ICD-10-CM | POA: Diagnosis present

## 2022-04-26 DIAGNOSIS — F32A Depression, unspecified: Secondary | ICD-10-CM | POA: Diagnosis not present

## 2022-04-26 DIAGNOSIS — Z1152 Encounter for screening for COVID-19: Secondary | ICD-10-CM | POA: Diagnosis not present

## 2022-04-26 DIAGNOSIS — A4150 Gram-negative sepsis, unspecified: Secondary | ICD-10-CM | POA: Diagnosis present

## 2022-04-26 DIAGNOSIS — J440 Chronic obstructive pulmonary disease with acute lower respiratory infection: Secondary | ICD-10-CM | POA: Diagnosis present

## 2022-04-26 DIAGNOSIS — I739 Peripheral vascular disease, unspecified: Secondary | ICD-10-CM | POA: Diagnosis not present

## 2022-04-26 DIAGNOSIS — E669 Obesity, unspecified: Secondary | ICD-10-CM | POA: Diagnosis present

## 2022-04-26 DIAGNOSIS — E876 Hypokalemia: Secondary | ICD-10-CM | POA: Diagnosis not present

## 2022-04-26 DIAGNOSIS — E861 Hypovolemia: Secondary | ICD-10-CM | POA: Diagnosis present

## 2022-04-26 DIAGNOSIS — E871 Hypo-osmolality and hyponatremia: Secondary | ICD-10-CM | POA: Diagnosis not present

## 2022-04-26 DIAGNOSIS — Z8249 Family history of ischemic heart disease and other diseases of the circulatory system: Secondary | ICD-10-CM | POA: Diagnosis not present

## 2022-04-26 DIAGNOSIS — J410 Simple chronic bronchitis: Secondary | ICD-10-CM

## 2022-04-26 DIAGNOSIS — Z87891 Personal history of nicotine dependence: Secondary | ICD-10-CM | POA: Diagnosis not present

## 2022-04-26 DIAGNOSIS — E1140 Type 2 diabetes mellitus with diabetic neuropathy, unspecified: Secondary | ICD-10-CM | POA: Diagnosis present

## 2022-04-26 DIAGNOSIS — J189 Pneumonia, unspecified organism: Secondary | ICD-10-CM | POA: Diagnosis present

## 2022-04-26 DIAGNOSIS — Z803 Family history of malignant neoplasm of breast: Secondary | ICD-10-CM | POA: Diagnosis not present

## 2022-04-26 DIAGNOSIS — F1911 Other psychoactive substance abuse, in remission: Secondary | ICD-10-CM | POA: Diagnosis present

## 2022-04-26 DIAGNOSIS — Z6837 Body mass index (BMI) 37.0-37.9, adult: Secondary | ICD-10-CM | POA: Diagnosis not present

## 2022-04-26 DIAGNOSIS — K7460 Unspecified cirrhosis of liver: Secondary | ICD-10-CM | POA: Diagnosis present

## 2022-04-26 DIAGNOSIS — Z811 Family history of alcohol abuse and dependence: Secondary | ICD-10-CM | POA: Diagnosis not present

## 2022-04-26 DIAGNOSIS — Z79899 Other long term (current) drug therapy: Secondary | ICD-10-CM | POA: Diagnosis not present

## 2022-04-26 DIAGNOSIS — G894 Chronic pain syndrome: Secondary | ICD-10-CM | POA: Diagnosis not present

## 2022-04-26 DIAGNOSIS — B1920 Unspecified viral hepatitis C without hepatic coma: Secondary | ICD-10-CM | POA: Diagnosis present

## 2022-04-26 DIAGNOSIS — I10 Essential (primary) hypertension: Secondary | ICD-10-CM | POA: Diagnosis not present

## 2022-04-26 DIAGNOSIS — R32 Unspecified urinary incontinence: Secondary | ICD-10-CM | POA: Diagnosis present

## 2022-04-26 LAB — BASIC METABOLIC PANEL
Anion gap: 12 (ref 5–15)
Anion gap: 11 (ref 5–15)
BUN: 10 mg/dL (ref 8–23)
BUN: 9 mg/dL (ref 8–23)
CO2: 22 mmol/L (ref 22–32)
CO2: 23 mmol/L (ref 22–32)
Calcium: 9.1 mg/dL (ref 8.9–10.3)
Calcium: 9.1 mg/dL (ref 8.9–10.3)
Chloride: 98 mmol/L (ref 98–111)
Chloride: 100 mmol/L (ref 98–111)
Creatinine, Ser: 0.68 mg/dL (ref 0.44–1.00)
Creatinine, Ser: 0.61 mg/dL (ref 0.44–1.00)
GFR, Estimated: >60 mL/min (ref >60)
GFR, Estimated: >60 mL/min (ref >60)
Glucose, Bld: 143 mg/dL — ABNORMAL HIGH (ref 70–99)
Glucose, Bld: 114 mg/dL — ABNORMAL HIGH (ref 70–99)
Potassium: 3.8 mmol/L (ref 3.5–5.1)
Potassium: 3.3 mmol/L — ABNORMAL LOW (ref 3.5–5.1)
Sodium: 132 mmol/L — ABNORMAL LOW (ref 135–145)
Sodium: 134 mmol/L — ABNORMAL LOW (ref 135–145)

## 2022-04-26 LAB — CBC
HCT: 27 % — ABNORMAL LOW (ref 36.0–46.0)
Hemoglobin: 8.7 g/dL — ABNORMAL LOW (ref 12.0–15.0)
MCH: 28 pg (ref 26.0–34.0)
MCHC: 32.2 g/dL (ref 30.0–36.0)
MCV: 86.8 fL (ref 80.0–100.0)
Platelets: 175 10*3/uL (ref 150–400)
RBC: 3.11 MIL/uL — ABNORMAL LOW (ref 3.87–5.11)
RDW: 12.3 % (ref 11.5–15.5)
WBC: 11.2 10*3/uL — ABNORMAL HIGH (ref 4.0–10.5)
nRBC: 0 % (ref 0.0–0.2)

## 2022-04-26 LAB — GLUCOSE, CAPILLARY
Glucose-Capillary: 124 mg/dL — ABNORMAL HIGH (ref 70–99)
Glucose-Capillary: 125 mg/dL — ABNORMAL HIGH (ref 70–99)
Glucose-Capillary: 175 mg/dL — ABNORMAL HIGH (ref 70–99)
Glucose-Capillary: 156 mg/dL — ABNORMAL HIGH (ref 70–99)

## 2022-04-26 LAB — URINE CULTURE: Culture: NO GROWTH

## 2022-04-26 MED ORDER — POTASSIUM CHLORIDE CRYS ER 20 MEQ PO TBCR
40.0000 meq | EXTENDED_RELEASE_TABLET | Freq: Two times a day (BID) | ORAL | Status: AC
  Administered 2022-04-26 – 2022-04-27 (×4): 40 meq via ORAL
  Filled 2022-04-26 (×4): qty 2

## 2022-04-26 MED ORDER — DULOXETINE HCL 30 MG PO CPEP
30.0000 mg | ORAL_CAPSULE | Freq: Two times a day (BID) | ORAL | Status: DC
  Administered 2022-04-26 – 2022-04-28 (×5): 30 mg via ORAL
  Filled 2022-04-26 (×5): qty 1

## 2022-04-26 MED ORDER — LOSARTAN POTASSIUM 50 MG PO TABS
100.0000 mg | ORAL_TABLET | Freq: Every day | ORAL | Status: DC
  Administered 2022-04-26 – 2022-04-28 (×3): 100 mg via ORAL
  Filled 2022-04-26 (×3): qty 2

## 2022-04-26 NOTE — Progress Notes (Signed)
PROGRESS NOTE    Ashley Riddle  YQI:[id number] DOB: 09/29/1945 DOA: 04/25/2022 PCP: Audley Hose, MD    Brief Narrative:  76 year old with history of hepatitis C and cirrhosis, chronic bronchitis, asthma, anxiety, depression, history of substance abuse currently on Suboxone presented to the emergency room with more than 1 week of shortness of breath, right-sided pleuritic chest pain and sputum with bloody streaks.  In the emergency room temperature 102.7, heart rate 100.  Blood pressure stable.  COVID-19 and flu negative.  CT angiogram of the chest and abdomen pelvis showed large right lower lobe consolidation consistent with pneumonia, negative for pulmonary embolism.  Cultures obtained.  Started on antibiotics.   Assessment & Plan:   Right lower lobe pneumonia, suspect both gram-positive and gram-negative organism: Sepsis present on admission due to leukocytosis, tachycardia and tachypnea.  Patient currently remains on ceftriaxone and doxycycline.   Chest physiotherapy, incentive spirometry, deep breathing exercises, sputum induction, mucolytic's and bronchodilators. Sputum cultures, blood cultures, Legionella and streptococcal antigen. Supplemental oxygen to keep saturations more than 90%.  Mobilize in the hallway.  Hyponatremia: Stabilizing.  Recheck tomorrow morning.  Hepatitis C and cirrhosis: Stable.  Anxiety/depression: On sertraline and Cymbalta.  Resume today.  Hypertension: Blood pressure stable.  Resume losartan.  Chronic pain syndrome: Patient on buprenorphine and oxycodone.  Peripheral vascular disease, on statin.  Stable.  Hypokalemia: Replace and monitor.   DVT prophylaxis: enoxaparin (LOVENOX) injection 40 mg Start: 04/25/22 2200   Code Status: Full code Family Communication: None at the bedside Disposition Plan: Status is: Observation The patient will require care spanning > 2 midnights and should be moved to inpatient because: Significant  pleuritic chest pain, right lower lobe consolidation, persistent fever.  Needs IV antibiotics.     Consultants:  None  Procedures:  None  Antimicrobials:  Rocephin and doxycycline 11/23---   Subjective: Patient was seen and examined.  Overnight events noted.  Temperature maximum 102.  She is still has cough and mucoid sputum with bloody streaks.  Eager to go home. Continues to have right  lateral chest wall pleuritic pain.   Objective: Vitals:   04/26/22 0359 04/26/22 0700 04/26/22 0805 04/26/22 0900  BP: 126/70 132/81 121/70 119/69  Pulse: 78 89 79 79  Resp: '20 18 17 20  '$ Temp: (!) 100.8 F (38.2 C)  97.7 F (36.5 C)   TempSrc: Oral  Oral   SpO2: 95% 97% 95% 97%  Weight:      Height:        Intake/Output Summary (Last 24 hours) at 04/26/2022 1119 Last data filed at 04/26/2022 0800 Gross per 24 hour  Intake 1160.92 ml  Output --  Net 1160.92 ml   Filed Weights   04/25/22 2102  Weight: 89.5 kg    Examination:  General exam: Appears calm and comfortable at rest. Respiratory system: Expiratory crackles and upper airway conducted sounds on the right base.  No other added sounds. Cardiovascular system: S1 & S2 heard, RRR. No JVD, murmurs, rubs, gallops or clicks. No pedal edema. Gastrointestinal system: Abdomen is nondistended, soft and nontender. No organomegaly or masses felt. Normal bowel sounds heard. Central nervous system: Alert and oriented. No focal neurological deficits. Extremities: Symmetric 5 x 5 power. Skin: No rashes, lesions or ulcers Psychiatry: Judgement and insight appear normal. Mood & affect appropriate.     Data Reviewed: I have personally reviewed following labs and imaging studies  CBC: Recent Labs  Lab 04/25/22 1151 04/26/22 0014  WBC 11.7* 11.2*  NEUTROABS  10.0*  --   HGB 10.1* 8.7*  HCT 31.7* 27.0*  MCV 90.1 86.8  PLT 159 694   Basic Metabolic Panel: Recent Labs  Lab 04/25/22 1236 04/25/22 2117 04/26/22 0014  04/26/22 0642  NA 129* 136 132* 134*  K 3.9 3.8 3.8 3.3*  CL 100 96* 98 100  CO2 17* '22 22 23  '$ GLUCOSE 140* 114* 143* 114*  BUN '15 8 10 9  '$ CREATININE 0.78 0.58 0.68 0.61  CALCIUM 8.8* 9.9 9.1 9.1   GFR: Estimated Creatinine Clearance: 62.8 mL/min (by C-G formula based on SCr of 0.61 mg/dL). Liver Function Tests: Recent Labs  Lab 04/25/22 1236  AST 21  ALT 12  ALKPHOS 68  BILITOT 0.9  PROT 6.8  ALBUMIN 3.1*   No results for input(s): "LIPASE", "AMYLASE" in the last 168 hours. No results for input(s): "AMMONIA" in the last 168 hours. Coagulation Profile: Recent Labs  Lab 04/25/22 1729  INR 1.3*   Cardiac Enzymes: No results for input(s): "CKTOTAL", "CKMB", "CKMBINDEX", "TROPONINI" in the last 168 hours. BNP (last 3 results) No results for input(s): "PROBNP" in the last 8760 hours. HbA1C: No results for input(s): "HGBA1C" in the last 72 hours. CBG: Recent Labs  Lab 04/25/22 2104 04/26/22 0631  GLUCAP 110* 124*   Lipid Profile: No results for input(s): "CHOL", "HDL", "LDLCALC", "TRIG", "CHOLHDL", "LDLDIRECT" in the last 72 hours. Thyroid Function Tests: No results for input(s): "TSH", "T4TOTAL", "FREET4", "T3FREE", "THYROIDAB" in the last 72 hours. Anemia Panel: No results for input(s): "VITAMINB12", "FOLATE", "FERRITIN", "TIBC", "IRON", "RETICCTPCT" in the last 72 hours. Sepsis Labs: Recent Labs  Lab 04/25/22 1726 04/25/22 2117  LATICACIDVEN 1.0 1.5    Recent Results (from the past 240 hour(s))  Resp Panel by RT-PCR (Flu A&B, Covid) Anterior Nasal Swab     Status: None   Collection Time: 04/25/22 11:12 AM   Specimen: Anterior Nasal Swab  Result Value Ref Range Status   SARS Coronavirus 2 by RT PCR NEGATIVE NEGATIVE Final    Comment: (NOTE) SARS-CoV-2 target nucleic acids are NOT DETECTED.  The SARS-CoV-2 RNA is generally detectable in upper respiratory specimens during the acute phase of infection. The lowest concentration of SARS-CoV-2 viral copies  this assay can detect is 138 copies/mL. A negative result does not preclude SARS-Cov-2 infection and should not be used as the sole basis for treatment or other patient management decisions. A negative result may occur with  improper specimen collection/handling, submission of specimen other than nasopharyngeal swab, presence of viral mutation(s) within the areas targeted by this assay, and inadequate number of viral copies(<138 copies/mL). A negative result must be combined with clinical observations, patient history, and epidemiological information. The expected result is Negative.  Fact Sheet for Patients:  EntrepreneurPulse.com.au  Fact Sheet for Healthcare Providers:  IncredibleEmployment.be  This test is no t yet approved or cleared by the [place] FDA and  has been authorized for detection and/or diagnosis of SARS-CoV-2 by FDA under an Emergency Use Authorization (EUA). This EUA will remain  in effect (meaning this test can be used) for the duration of the COVID-19 declaration under Section 564(b)(1) of the Act, 21 U.S.C.section 360bbb-3(b)(1), unless the authorization is terminated  or revoked sooner.       Influenza A by PCR NEGATIVE NEGATIVE Final   Influenza B by PCR NEGATIVE NEGATIVE Final    Comment: (NOTE) The Xpert Xpress SARS-CoV-2/FLU/RSV plus assay is intended as an aid in the diagnosis of influenza from Nasopharyngeal swab specimens and  should not be used as a sole basis for treatment. Nasal washings and aspirates are unacceptable for Xpert Xpress SARS-CoV-2/FLU/RSV testing.  Fact Sheet for Patients: EntrepreneurPulse.com.au  Fact Sheet for Healthcare Providers: IncredibleEmployment.be  This test is not yet approved or cleared by the [place] FDA and has been authorized for detection and/or diagnosis of SARS-CoV-2 by FDA under an Emergency Use Authorization (EUA). This EUA  will remain in effect (meaning this test can be used) for the duration of the COVID-19 declaration under Section 564(b)(1) of the Act, 21 U.S.C. section 360bbb-3(b)(1), unless the authorization is terminated or revoked.  Performed at [facility], [place] [address] [place], [place] [address]   Blood Culture (routine x 2)     Status: None (Preliminary result)   Collection Time: 04/25/22  4:38 PM   Specimen: BLOOD  Result Value Ref Range Status   Specimen Description BLOOD SITE NOT SPECIFIED  Final   Special Requests   Final    BOTTLES DRAWN AEROBIC AND ANAEROBIC Blood Culture results may not be optimal due to an inadequate volume of blood received in culture bottles   Culture   Final    NO GROWTH < 24 HOURS Performed at [facility], [place] [address], [place], [place] [address]    Report Status PENDING  Incomplete  Blood Culture (routine x 2)     Status: None (Preliminary result)   Collection Time: 04/25/22  5:26 PM   Specimen: BLOOD  Result Value Ref Range Status   Specimen Description BLOOD SITE NOT SPECIFIED  Final   Special Requests   Final    BOTTLES DRAWN AEROBIC AND ANAEROBIC Blood Culture adequate volume   Culture   Final    NO GROWTH < 24 HOURS Performed at [facility], [place] [address] [place], [place] [address]    Report Status PENDING  Incomplete  MRSA Next Gen by PCR, Nasal     Status: None   Collection Time: 04/25/22  9:01 PM   Specimen: Nasal Mucosa; Nasal Swab  Result Value Ref Range Status   MRSA by PCR Next Gen NOT DETECTED NOT DETECTED Final    Comment: (NOTE) The GeneXpert MRSA Assay (FDA approved for NASAL specimens only), is one component of a comprehensive MRSA colonization surveillance program. It is not intended to diagnose MRSA infection nor to guide or monitor treatment for MRSA infections. Test performance is not FDA approved in patients less than 36 years old. Performed at [facility], [place] [address], [place],  [place] [address]          Radiology Studies: CT ABDOMEN PELVIS W CONTRAST  Result Date: 04/25/2022 CLINICAL DATA:  Sepsis, right flank pain EXAM: CT ABDOMEN AND PELVIS WITH CONTRAST TECHNIQUE: Multidetector CT imaging of the abdomen and pelvis was performed using the standard protocol following bolus administration of intravenous contrast. RADIATION DOSE REDUCTION: This exam was performed according to the departmental dose-optimization program which includes automated exposure control, adjustment of the mA and/or kV according to patient size and/or use of iterative reconstruction technique. CONTRAST:  64m OMNIPAQUE IOHEXOL 350 MG/ML SOLN COMPARISON:  Same-day CT abdomen pelvis, 04/25/2022, 4:36 p.m. FINDINGS: Lower chest: Large, very dense consolidation of the partially included right lower lobe (series 4, image 1). Hepatobiliary: No solid liver abnormality is seen. Somewhat coarse contour of the liver, particularly of the left lobe (series 3, image 17). No gallstones, gallbladder wall thickening, or biliary dilatation. Pancreas: Unremarkable. No pancreatic ductal dilatation or  surrounding inflammatory changes. Spleen: Normal in size without significant abnormality. Adrenals/Urinary Tract: Adrenal glands are unremarkable. Excreted contrast in the renal collecting systems and bladder. Kidneys are normal, without renal calculi, solid lesion, or hydronephrosis. Bladder is unremarkable. Stomach/Bowel: Stomach is within normal limits. Appendix is not clearly visualized and may be surgically absent. No evidence of bowel wall thickening, distention, or inflammatory changes. Occasional sigmoid diverticula. Vascular/Lymphatic: Scattered aortic atherosclerosis. No enlarged abdominal or pelvic lymph nodes. Reproductive: Status post hysterectomy. Other: No abdominal wall hernia or abnormality. No ascites. Musculoskeletal: No acute or significant osseous findings. IMPRESSION: 1. No acute CT findings of the abdomen or  pelvis to explain right flank pain. No evidence of urinary tract calculus or hydronephrosis. There is again seen minimal perinephric fat stranding, of uncertain chronicity or significance, without renal cortical hypoenhancement or other specific findings to suggest pyelonephritis. 2. Large, very dense consolidation of the partially included right lower lobe, better included on prior dedicated imaging of the chest and most consistent with infection or aspiration. 3. Somewhat coarse contour of the liver, suggestive of cirrhosis. Correlate with biochemical findings. Aortic Atherosclerosis (ICD10-I70.0). Electronically Signed   By: Delanna Ahmadi M.D.   On: 04/25/2022 18:21   CT Angio Chest PE W and/or Wo Contrast  Result Date: 04/25/2022 CLINICAL DATA:  76 year old female with chest, abdominal and pelvic pain and possible sepsis. EXAM: CT ABDOMEN AND PELVIS WITHOUT CONTRAST CT ANGIOGRAPHY CHEST TECHNIQUE: Multidetector CT imaging of the abdomen and pelvis was performed using the standard protocol without intravenous contrast. Multidetector CT imaging of the chest was performed using the standard protocol during bolus administration of intravenous contrast. Multiplanar CT image reconstructions and MIPs were obtained to evaluate the vascular anatomy. RADIATION DOSE REDUCTION: This exam was performed according to the departmental dose-optimization program which includes automated exposure control, adjustment of the mA and/or kV according to patient size and/or use of iterative reconstruction technique. CONTRAST:  40m OMNIPAQUE IOHEXOL 350 MG/ML SOLN COMPARISON:  04/19/2020 CTs FINDINGS: CTA CHEST FINDINGS Cardiovascular: This is a technically satisfactory study but respiratory motion artifact decreases sensitivity and some portions of the lungs. No pulmonary emboli are identified. Mild cardiomegaly is present. Coronary artery and aortic atherosclerotic calcifications noted. There is no evidence of thoracic aortic  aneurysm or pericardial effusion. Mediastinum/Nodes: No enlarged mediastinal, hilar, or axillary lymph nodes. Thyroid gland, trachea, and esophagus demonstrate no significant findings. Lungs/Pleura: Consolidation throughout a large portion of the RIGHT LOWER lobe is noted, likely representing pneumonia. No other airspace disease, consolidation, pleural effusion or pneumothorax noted. Musculoskeletal: No acute or suspicious bony abnormalities are noted. LEFT shoulder arthroplasty changes noted. Review of the MIP images confirms the above findings. CT ABDOMEN and PELVIS FINDINGS Please note that parenchymal and vascular abnormalities may be missed as intravenous contrast was not administered. Hepatobiliary: Cirrhosis identified without definite focal hepatic lesion. The gallbladder is unremarkable. There is no evidence of intrahepatic or extrahepatic biliary dilatation. Pancreas: Unremarkable Spleen: UPPER limits normal spleen size noted. Adrenals/Urinary Tract: Mild perinephric stranding is noted of uncertain chronicity and etiology. No other renal, adrenal or bladder abnormalities identified. There is no evidence of hydronephrosis. Stomach/Bowel: Stomach is within normal limits. No evidence of bowel wall thickening, distention, or inflammatory changes. Vascular/Lymphatic: Aortic atherosclerosis. No enlarged abdominal or pelvic lymph nodes. Reproductive: Status post hysterectomy. No adnexal masses. Other: No ascites, focal collection or pneumoperitoneum. Musculoskeletal: No acute or suspicious bony abnormalities are noted. Degenerative changes in the LOWER lumbar spine again noted. Review of the MIP images confirms the  above findings. IMPRESSION: 1. Large area of RIGHT LOWER lobe consolidation, likely representing pneumonia. Radiographic follow-up to resolution is recommended. 2. No evidence of pulmonary emboli or thoracic aortic aneurysm. 3. Mild perinephric stranding of uncertain chronicity and etiology, and may  represent inflammation or infection. No evidence of hydronephrosis. 4. Cirrhosis and UPPER limits normal spleen size. 5. Cardiomegaly and coronary artery disease. 6.  Aortic aneurysm NOS (ICD10-I71.9). Electronically Signed   By: Margarette Canada M.D.   On: 04/25/2022 17:15   CT Renal Stone Study  Result Date: 04/25/2022 CLINICAL DATA:  76 year old female with chest, abdominal and pelvic pain and possible sepsis. EXAM: CT ABDOMEN AND PELVIS WITHOUT CONTRAST CT ANGIOGRAPHY CHEST TECHNIQUE: Multidetector CT imaging of the abdomen and pelvis was performed using the standard protocol without intravenous contrast. Multidetector CT imaging of the chest was performed using the standard protocol during bolus administration of intravenous contrast. Multiplanar CT image reconstructions and MIPs were obtained to evaluate the vascular anatomy. RADIATION DOSE REDUCTION: This exam was performed according to the departmental dose-optimization program which includes automated exposure control, adjustment of the mA and/or kV according to patient size and/or use of iterative reconstruction technique. CONTRAST:  85m OMNIPAQUE IOHEXOL 350 MG/ML SOLN COMPARISON:  04/19/2020 CTs FINDINGS: CTA CHEST FINDINGS Cardiovascular: This is a technically satisfactory study but respiratory motion artifact decreases sensitivity and some portions of the lungs. No pulmonary emboli are identified. Mild cardiomegaly is present. Coronary artery and aortic atherosclerotic calcifications noted. There is no evidence of thoracic aortic aneurysm or pericardial effusion. Mediastinum/Nodes: No enlarged mediastinal, hilar, or axillary lymph nodes. Thyroid gland, trachea, and esophagus demonstrate no significant findings. Lungs/Pleura: Consolidation throughout a large portion of the RIGHT LOWER lobe is noted, likely representing pneumonia. No other airspace disease, consolidation, pleural effusion or pneumothorax noted. Musculoskeletal: No acute or suspicious  bony abnormalities are noted. LEFT shoulder arthroplasty changes noted. Review of the MIP images confirms the above findings. CT ABDOMEN and PELVIS FINDINGS Please note that parenchymal and vascular abnormalities may be missed as intravenous contrast was not administered. Hepatobiliary: Cirrhosis identified without definite focal hepatic lesion. The gallbladder is unremarkable. There is no evidence of intrahepatic or extrahepatic biliary dilatation. Pancreas: Unremarkable Spleen: UPPER limits normal spleen size noted. Adrenals/Urinary Tract: Mild perinephric stranding is noted of uncertain chronicity and etiology. No other renal, adrenal or bladder abnormalities identified. There is no evidence of hydronephrosis. Stomach/Bowel: Stomach is within normal limits. No evidence of bowel wall thickening, distention, or inflammatory changes. Vascular/Lymphatic: Aortic atherosclerosis. No enlarged abdominal or pelvic lymph nodes. Reproductive: Status post hysterectomy. No adnexal masses. Other: No ascites, focal collection or pneumoperitoneum. Musculoskeletal: No acute or suspicious bony abnormalities are noted. Degenerative changes in the LOWER lumbar spine again noted. Review of the MIP images confirms the above findings. IMPRESSION: 1. Large area of RIGHT LOWER lobe consolidation, likely representing pneumonia. Radiographic follow-up to resolution is recommended. 2. No evidence of pulmonary emboli or thoracic aortic aneurysm. 3. Mild perinephric stranding of uncertain chronicity and etiology, and may represent inflammation or infection. No evidence of hydronephrosis. 4. Cirrhosis and UPPER limits normal spleen size. 5. Cardiomegaly and coronary artery disease. 6.  Aortic aneurysm NOS (ICD10-I71.9). Electronically Signed   By: JMargarette CanadaM.D.   On: 04/25/2022 17:15        Scheduled Meds:  Buprenorphine HCl   Buccal BID   doxycycline  100 mg Oral Q12H   enoxaparin (LOVENOX) injection  40 mg Subcutaneous Q24H    insulin aspart  0-9 Units Subcutaneous  TID WC   potassium chloride  40 mEq Oral BID   rosuvastatin  5 mg Oral Daily   sertraline  25 mg Oral Daily   sodium chloride flush  3 mL Intravenous Q12H   tiZANidine  4 mg Oral QHS   Continuous Infusions:  cefTRIAXone (ROCEPHIN)  IV Stopped (04/25/22 2216)     LOS: 0 days    Time spent: 35 minutes    Barb Merino, MD [facility] Pager [phone]

## 2022-04-26 NOTE — Progress Notes (Signed)
[place] for patient to shower, and d/c from telemetry per Dr Sloan Leiter

## 2022-04-26 NOTE — Progress Notes (Signed)
Pt take a muscle relaxant at night and requested a purewick when sleeping.

## 2022-04-26 NOTE — TOC Progression Note (Signed)
Transition of Care [facility]) - Progression Note    Patient Details  Name: Ashley Riddle MRN: [id number] Date of Birth: 1945-06-29  Transition of Care [facility]) CM/SW Contact  Zenon Mayo, RN Phone Number: 04/26/2022, 3:28 PM  Clinical Narrative:    From home PNA, if no fever tonight may be able to possibly dc tomorrow with po abx, has no needs. TOC following.         Expected Discharge Plan and Services                                                 Social Determinants of Health (SDOH) Interventions    Readmission Risk Interventions     No data to display

## 2022-04-27 LAB — BASIC METABOLIC PANEL
Anion gap: 16 — ABNORMAL HIGH (ref 5–15)
BUN: 10 mg/dL (ref 8–23)
CO2: 19 mmol/L — ABNORMAL LOW (ref 22–32)
Calcium: 9.4 mg/dL (ref 8.9–10.3)
Chloride: 98 mmol/L (ref 98–111)
Creatinine, Ser: 0.54 mg/dL (ref 0.44–1.00)
GFR, Estimated: >60 mL/min (ref >60)
Glucose, Bld: 158 mg/dL — ABNORMAL HIGH (ref 70–99)
Potassium: 3.8 mmol/L (ref 3.5–5.1)
Sodium: 133 mmol/L — ABNORMAL LOW (ref 135–145)

## 2022-04-27 LAB — CBC WITH DIFFERENTIAL/PLATELET
Abs Immature Granulocytes: 0.03 10*3/uL (ref 0.00–0.07)
Basophils Absolute: 0 10*3/uL (ref 0.0–0.1)
Basophils Relative: 0 %
Eosinophils Absolute: 0 10*3/uL (ref 0.0–0.5)
Eosinophils Relative: 0 %
HCT: 26.9 % — ABNORMAL LOW (ref 36.0–46.0)
Hemoglobin: 8.9 g/dL — ABNORMAL LOW (ref 12.0–15.0)
Immature Granulocytes: 1 %
Lymphocytes Relative: 15 %
Lymphs Abs: 0.8 10*3/uL (ref 0.7–4.0)
MCH: 28.4 pg (ref 26.0–34.0)
MCHC: 33.1 g/dL (ref 30.0–36.0)
MCV: 85.9 fL (ref 80.0–100.0)
Monocytes Absolute: 0.6 10*3/uL (ref 0.1–1.0)
Monocytes Relative: 12 %
Neutro Abs: 3.8 10*3/uL (ref 1.7–7.7)
Neutrophils Relative %: 72 %
Platelets: 163 10*3/uL (ref 150–400)
RBC: 3.13 MIL/uL — ABNORMAL LOW (ref 3.87–5.11)
RDW: 12.5 % (ref 11.5–15.5)
WBC: 5.3 10*3/uL (ref 4.0–10.5)
nRBC: 0 % (ref 0.0–0.2)

## 2022-04-27 LAB — GLUCOSE, CAPILLARY
Glucose-Capillary: 98 mg/dL (ref 70–99)
Glucose-Capillary: 157 mg/dL — ABNORMAL HIGH (ref 70–99)
Glucose-Capillary: 105 mg/dL — ABNORMAL HIGH (ref 70–99)
Glucose-Capillary: 158 mg/dL — ABNORMAL HIGH (ref 70–99)

## 2022-04-27 LAB — MAGNESIUM: Magnesium: 1.9 mg/dL (ref 1.7–2.4)

## 2022-04-27 NOTE — Plan of Care (Signed)

## 2022-04-27 NOTE — Progress Notes (Signed)
PROGRESS NOTE    Ashley Riddle  ZYS:[id number] DOB: [birth year] DOA: 04/25/2022 PCP: Audley Hose, MD    Brief Narrative:  76 year old with history of hepatitis C and cirrhosis, chronic bronchitis, asthma, anxiety, depression, history of substance abuse currently on Suboxone presented to the emergency room with more than 1 week of shortness of breath, right-sided pleuritic chest pain and sputum with bloody streaks.  In the emergency room temperature 102.7, heart rate 100.  Blood pressure stable.  COVID-19 and flu negative.  CT angiogram of the chest and abdomen pelvis showed large right lower lobe consolidation consistent with pneumonia, negative for pulmonary embolism.  Cultures obtained.  Started on antibiotics.   Assessment & Plan:   Right lower lobe pneumonia, suspect both gram-positive and gram-negative organism: Sepsis present on admission due to leukocytosis, tachycardia and tachypnea.  Patient currently remains on ceftriaxone and doxycycline.   Chest physiotherapy, incentive spirometry, deep breathing exercises, sputum induction, mucolytic's and bronchodilators. Sputum cultures, blood cultures, Legionella and streptococcal antigen negative so far. Supplemental oxygen to keep saturations more than 90%.  Mobilize in the hallway.  Hyponatremia: Stabilizing.    Hepatitis C and cirrhosis: Stable.  Anxiety/depression: On sertraline and Cymbalta.  Resumed.  Hypertension: Blood pressure stable.    Chronic pain syndrome: Patient on buprenorphine and oxycodone.  Peripheral vascular disease, on statin.  Stable.  Hypokalemia: Replaced and adequate.   DVT prophylaxis: enoxaparin (LOVENOX) injection 40 mg Start: 04/25/22 2200   Code Status: Full code Family Communication: None at the bedside Disposition Plan: Status is: Inpatient.  Significant pneumonia.  On IV antibiotics.   Consultants:  None  Procedures:  None  Antimicrobials:  Rocephin and doxycycline  11/23---   Subjective: Patient seen and examined.  Coughing with plenty of mucoid sputum.  Afebrile overnight.  Chest pain is improving but still present.  No more bloody streaks on the sputum.  Objective: Vitals:   04/27/22 0516 04/27/22 0601 04/27/22 0757 04/27/22 1143  BP: (!) 143/83  (!) 162/99 (!) 175/96  Pulse: 89  95 99  Resp: '16  19 18  '$ Temp: 98.2 F (36.8 C)  98.4 F (36.9 C) 98.3 F (36.8 C)  TempSrc:   Oral Oral  SpO2: 96%  99% 95%  Weight:  91.1 kg    Height:        Intake/Output Summary (Last 24 hours) at 04/27/2022 1323 Last data filed at 04/27/2022 1031 Gross per 24 hour  Intake 363 ml  Output --  Net 363 ml   Filed Weights   04/25/22 2102 04/27/22 0601  Weight: 89.5 kg 91.1 kg    Examination:  General exam: Appears calm and comfortable at rest.  Slightly anxious on coughing. Respiratory system: Expiratory crackles and upper airway conducted sounds on the right base.  No other added sounds. Cardiovascular system: S1 & S2 heard, RRR. No JVD, murmurs, rubs, gallops or clicks. No pedal edema. Gastrointestinal system: Abdomen is nondistended, soft and nontender. No organomegaly or masses felt. Normal bowel sounds heard. Central nervous system: Alert and oriented. No focal neurological deficits. Extremities: Symmetric 5 x 5 power. Skin: No rashes, lesions or ulcers Psychiatry: Judgement and insight appear normal. Mood & affect appropriate.     Data Reviewed: I have personally reviewed following labs and imaging studies  CBC: Recent Labs  Lab 04/25/22 1151 04/26/22 0014 04/27/22 0412  WBC 11.7* 11.2* 5.3  NEUTROABS 10.0*  --  3.8  HGB 10.1* 8.7* 8.9*  HCT 31.7* 27.0* 26.9*  MCV 90.1 86.8  85.9  PLT 159 175 449   Basic Metabolic Panel: Recent Labs  Lab 04/25/22 1236 04/25/22 2117 04/26/22 0014 04/26/22 0642 04/27/22 0412  NA 129* 136 132* 134* 133*  K 3.9 3.8 3.8 3.3* 3.8  CL 100 96* 98 100 98  CO2 17* '22 22 23 '$ 19*  GLUCOSE 140* 114*  143* 114* 158*  BUN '15 8 10 9 10  '$ CREATININE 0.78 0.58 0.68 0.61 0.54  CALCIUM 8.8* 9.9 9.1 9.1 9.4  MG  --   --   --   --  1.9   GFR: Estimated Creatinine Clearance: 63.4 mL/min (by C-G formula based on SCr of 0.54 mg/dL). Liver Function Tests: Recent Labs  Lab 04/25/22 1236  AST 21  ALT 12  ALKPHOS 68  BILITOT 0.9  PROT 6.8  ALBUMIN 3.1*   No results for input(s): "LIPASE", "AMYLASE" in the last 168 hours. No results for input(s): "AMMONIA" in the last 168 hours. Coagulation Profile: Recent Labs  Lab 04/25/22 1729  INR 1.3*   Cardiac Enzymes: No results for input(s): "CKTOTAL", "CKMB", "CKMBINDEX", "TROPONINI" in the last 168 hours. BNP (last 3 results) No results for input(s): "PROBNP" in the last 8760 hours. HbA1C: No results for input(s): "HGBA1C" in the last 72 hours. CBG: Recent Labs  Lab 04/26/22 1137 04/26/22 1634 04/26/22 2025 04/27/22 0756 04/27/22 1147  GLUCAP 125* 175* 156* 98 157*   Lipid Profile: No results for input(s): "CHOL", "HDL", "LDLCALC", "TRIG", "CHOLHDL", "LDLDIRECT" in the last 72 hours. Thyroid Function Tests: No results for input(s): "TSH", "T4TOTAL", "FREET4", "T3FREE", "THYROIDAB" in the last 72 hours. Anemia Panel: No results for input(s): "VITAMINB12", "FOLATE", "FERRITIN", "TIBC", "IRON", "RETICCTPCT" in the last 72 hours. Sepsis Labs: Recent Labs  Lab 04/25/22 1726 04/25/22 2117  LATICACIDVEN 1.0 1.5    Recent Results (from the past 240 hour(s))  Resp Panel by RT-PCR (Flu A&B, Covid) Anterior Nasal Swab     Status: None   Collection Time: 04/25/22 11:12 AM   Specimen: Anterior Nasal Swab  Result Value Ref Range Status   SARS Coronavirus 2 by RT PCR NEGATIVE NEGATIVE Final    Comment: (NOTE) SARS-CoV-2 target nucleic acids are NOT DETECTED.  The SARS-CoV-2 RNA is generally detectable in upper respiratory specimens during the acute phase of infection. The lowest concentration of SARS-CoV-2 viral copies this assay can  detect is 138 copies/mL. A negative result does not preclude SARS-Cov-2 infection and should not be used as the sole basis for treatment or other patient management decisions. A negative result may occur with  improper specimen collection/handling, submission of specimen other than nasopharyngeal swab, presence of viral mutation(s) within the areas targeted by this assay, and inadequate number of viral copies(<138 copies/mL). A negative result must be combined with clinical observations, patient history, and epidemiological information. The expected result is Negative.  Fact Sheet for Patients:  EntrepreneurPulse.com.au  Fact Sheet for Healthcare Providers:  IncredibleEmployment.be  This test is no t yet approved or cleared by the [place] FDA and  has been authorized for detection and/or diagnosis of SARS-CoV-2 by FDA under an Emergency Use Authorization (EUA). This EUA will remain  in effect (meaning this test can be used) for the duration of the COVID-19 declaration under Section 564(b)(1) of the Act, 21 U.S.C.section 360bbb-3(b)(1), unless the authorization is terminated  or revoked sooner.       Influenza A by PCR NEGATIVE NEGATIVE Final   Influenza B by PCR NEGATIVE NEGATIVE Final    Comment: (NOTE) The  Xpert Xpress SARS-CoV-2/FLU/RSV plus assay is intended as an aid in the diagnosis of influenza from Nasopharyngeal swab specimens and should not be used as a sole basis for treatment. Nasal washings and aspirates are unacceptable for Xpert Xpress SARS-CoV-2/FLU/RSV testing.  Fact Sheet for Patients: EntrepreneurPulse.com.au  Fact Sheet for Healthcare Providers: IncredibleEmployment.be  This test is not yet approved or cleared by the [place] FDA and has been authorized for detection and/or diagnosis of SARS-CoV-2 by FDA under an Emergency Use Authorization (EUA). This EUA will remain in  effect (meaning this test can be used) for the duration of the COVID-19 declaration under Section 564(b)(1) of the Act, 21 U.S.C. section 360bbb-3(b)(1), unless the authorization is terminated or revoked.  Performed at [facility], [place] [address], [place], [place] [address]   Urine Culture     Status: None   Collection Time: 04/25/22  4:33 PM   Specimen: In/Out Cath Urine  Result Value Ref Range Status   Specimen Description IN/OUT CATH URINE  Final   Special Requests NONE  Final   Culture   Final    NO GROWTH Performed at [facility], [place] [address] [place], [place] [address]    Report Status 04/26/2022 FINAL  Final  Blood Culture (routine x 2)     Status: None (Preliminary result)   Collection Time: 04/25/22  4:38 PM   Specimen: BLOOD  Result Value Ref Range Status   Specimen Description BLOOD SITE NOT SPECIFIED  Final   Special Requests   Final    BOTTLES DRAWN AEROBIC AND ANAEROBIC Blood Culture results may not be optimal due to an inadequate volume of blood received in culture bottles   Culture   Final    NO GROWTH 2 DAYS Performed at [facility], [place] [address] [place], [place] [address]    Report Status PENDING  Incomplete  Blood Culture (routine x 2)     Status: None (Preliminary result)   Collection Time: 04/25/22  5:26 PM   Specimen: BLOOD  Result Value Ref Range Status   Specimen Description BLOOD SITE NOT SPECIFIED  Final   Special Requests   Final    BOTTLES DRAWN AEROBIC AND ANAEROBIC Blood Culture adequate volume   Culture   Final    NO GROWTH 2 DAYS Performed at [facility], [place] [address] [place], [place] [address]    Report Status PENDING  Incomplete  MRSA Next Gen by PCR, Nasal     Status: None   Collection Time: 04/25/22  9:01 PM   Specimen: Nasal Mucosa; Nasal Swab  Result Value Ref Range Status   MRSA by PCR Next Gen NOT DETECTED NOT DETECTED Final    Comment: (NOTE) The GeneXpert MRSA Assay (FDA approved for NASAL  specimens only), is one component of a comprehensive MRSA colonization surveillance program. It is not intended to diagnose MRSA infection nor to guide or monitor treatment for MRSA infections. Test performance is not FDA approved in patients less than 66 years old. Performed at [facility], [place] [address], [place],  [address]   Expectorated Sputum Assessment w Gram Stain, Rflx to Resp Cult     Status: None (Preliminary result)   Collection Time: 04/26/22  2:00 PM   Specimen: Expectorated Sputum  Result Value Ref Range Status   Specimen Description EXPECTORATED SPUTUM  Final   Special Requests NONE  Final   Sputum evaluation   Final    Sputum  specimen not acceptable for testing.  Please recollect.   RESULT CALLED TO, READ BACK BY AND VERIFIED WITH: R BARNES,RN'@2209'$  04/26/22 [place] Performed at [facility], [place] [address] [place], [place] [address]    Report Status PENDING  Incomplete         Radiology Studies: CT ABDOMEN PELVIS W CONTRAST  Result Date: 04/25/2022 CLINICAL DATA:  Sepsis, right flank pain EXAM: CT ABDOMEN AND PELVIS WITH CONTRAST TECHNIQUE: Multidetector CT imaging of the abdomen and pelvis was performed using the standard protocol following bolus administration of intravenous contrast. RADIATION DOSE REDUCTION: This exam was performed according to the departmental dose-optimization program which includes automated exposure control, adjustment of the mA and/or kV according to patient size and/or use of iterative reconstruction technique. CONTRAST:  5m OMNIPAQUE IOHEXOL 350 MG/ML SOLN COMPARISON:  Same-day CT abdomen pelvis, 04/25/2022, 4:36 p.m. FINDINGS: Lower chest: Large, very dense consolidation of the partially included right lower lobe (series 4, image 1). Hepatobiliary: No solid liver abnormality is seen. Somewhat coarse contour of the liver, particularly of the left lobe (series 3, image 17). No gallstones, gallbladder wall thickening, or  biliary dilatation. Pancreas: Unremarkable. No pancreatic ductal dilatation or surrounding inflammatory changes. Spleen: Normal in size without significant abnormality. Adrenals/Urinary Tract: Adrenal glands are unremarkable. Excreted contrast in the renal collecting systems and bladder. Kidneys are normal, without renal calculi, solid lesion, or hydronephrosis. Bladder is unremarkable. Stomach/Bowel: Stomach is within normal limits. Appendix is not clearly visualized and may be surgically absent. No evidence of bowel wall thickening, distention, or inflammatory changes. Occasional sigmoid diverticula. Vascular/Lymphatic: Scattered aortic atherosclerosis. No enlarged abdominal or pelvic lymph nodes. Reproductive: Status post hysterectomy. Other: No abdominal wall hernia or abnormality. No ascites. Musculoskeletal: No acute or significant osseous findings. IMPRESSION: 1. No acute CT findings of the abdomen or pelvis to explain right flank pain. No evidence of urinary tract calculus or hydronephrosis. There is again seen minimal perinephric fat stranding, of uncertain chronicity or significance, without renal cortical hypoenhancement or other specific findings to suggest pyelonephritis. 2. Large, very dense consolidation of the partially included right lower lobe, better included on prior dedicated imaging of the chest and most consistent with infection or aspiration. 3. Somewhat coarse contour of the liver, suggestive of cirrhosis. Correlate with biochemical findings. Aortic Atherosclerosis (ICD10-I70.0). Electronically Signed   By: ADelanna AhmadiM.D.   On: 04/25/2022 18:21   CT Angio Chest PE W and/or Wo Contrast  Result Date: 04/25/2022 CLINICAL DATA:  76year old female with chest, abdominal and pelvic pain and possible sepsis. EXAM: CT ABDOMEN AND PELVIS WITHOUT CONTRAST CT ANGIOGRAPHY CHEST TECHNIQUE: Multidetector CT imaging of the abdomen and pelvis was performed using the standard protocol without  intravenous contrast. Multidetector CT imaging of the chest was performed using the standard protocol during bolus administration of intravenous contrast. Multiplanar CT image reconstructions and MIPs were obtained to evaluate the vascular anatomy. RADIATION DOSE REDUCTION: This exam was performed according to the departmental dose-optimization program which includes automated exposure control, adjustment of the mA and/or kV according to patient size and/or use of iterative reconstruction technique. CONTRAST:  564mOMNIPAQUE IOHEXOL 350 MG/ML SOLN COMPARISON:  04/19/2020 CTs FINDINGS: CTA CHEST FINDINGS Cardiovascular: This is a technically satisfactory study but respiratory motion artifact decreases sensitivity and some portions of the lungs. No pulmonary emboli are identified. Mild cardiomegaly is present. Coronary artery and aortic atherosclerotic calcifications noted. There is no evidence of thoracic aortic aneurysm or pericardial effusion. Mediastinum/Nodes: No enlarged mediastinal, hilar, or  axillary lymph nodes. Thyroid gland, trachea, and esophagus demonstrate no significant findings. Lungs/Pleura: Consolidation throughout a large portion of the RIGHT LOWER lobe is noted, likely representing pneumonia. No other airspace disease, consolidation, pleural effusion or pneumothorax noted. Musculoskeletal: No acute or suspicious bony abnormalities are noted. LEFT shoulder arthroplasty changes noted. Review of the MIP images confirms the above findings. CT ABDOMEN and PELVIS FINDINGS Please note that parenchymal and vascular abnormalities may be missed as intravenous contrast was not administered. Hepatobiliary: Cirrhosis identified without definite focal hepatic lesion. The gallbladder is unremarkable. There is no evidence of intrahepatic or extrahepatic biliary dilatation. Pancreas: Unremarkable Spleen: UPPER limits normal spleen size noted. Adrenals/Urinary Tract: Mild perinephric stranding is noted of uncertain  chronicity and etiology. No other renal, adrenal or bladder abnormalities identified. There is no evidence of hydronephrosis. Stomach/Bowel: Stomach is within normal limits. No evidence of bowel wall thickening, distention, or inflammatory changes. Vascular/Lymphatic: Aortic atherosclerosis. No enlarged abdominal or pelvic lymph nodes. Reproductive: Status post hysterectomy. No adnexal masses. Other: No ascites, focal collection or pneumoperitoneum. Musculoskeletal: No acute or suspicious bony abnormalities are noted. Degenerative changes in the LOWER lumbar spine again noted. Review of the MIP images confirms the above findings. IMPRESSION: 1. Large area of RIGHT LOWER lobe consolidation, likely representing pneumonia. Radiographic follow-up to resolution is recommended. 2. No evidence of pulmonary emboli or thoracic aortic aneurysm. 3. Mild perinephric stranding of uncertain chronicity and etiology, and may represent inflammation or infection. No evidence of hydronephrosis. 4. Cirrhosis and UPPER limits normal spleen size. 5. Cardiomegaly and coronary artery disease. 6.  Aortic aneurysm NOS (ICD10-I71.9). Electronically Signed   By: Margarette Canada M.D.   On: 04/25/2022 17:15   CT Renal Stone Study  Result Date: 04/25/2022 CLINICAL DATA:  76 year old female with chest, abdominal and pelvic pain and possible sepsis. EXAM: CT ABDOMEN AND PELVIS WITHOUT CONTRAST CT ANGIOGRAPHY CHEST TECHNIQUE: Multidetector CT imaging of the abdomen and pelvis was performed using the standard protocol without intravenous contrast. Multidetector CT imaging of the chest was performed using the standard protocol during bolus administration of intravenous contrast. Multiplanar CT image reconstructions and MIPs were obtained to evaluate the vascular anatomy. RADIATION DOSE REDUCTION: This exam was performed according to the departmental dose-optimization program which includes automated exposure control, adjustment of the mA and/or kV  according to patient size and/or use of iterative reconstruction technique. CONTRAST:  72m OMNIPAQUE IOHEXOL 350 MG/ML SOLN COMPARISON:  04/19/2020 CTs FINDINGS: CTA CHEST FINDINGS Cardiovascular: This is a technically satisfactory study but respiratory motion artifact decreases sensitivity and some portions of the lungs. No pulmonary emboli are identified. Mild cardiomegaly is present. Coronary artery and aortic atherosclerotic calcifications noted. There is no evidence of thoracic aortic aneurysm or pericardial effusion. Mediastinum/Nodes: No enlarged mediastinal, hilar, or axillary lymph nodes. Thyroid gland, trachea, and esophagus demonstrate no significant findings. Lungs/Pleura: Consolidation throughout a large portion of the RIGHT LOWER lobe is noted, likely representing pneumonia. No other airspace disease, consolidation, pleural effusion or pneumothorax noted. Musculoskeletal: No acute or suspicious bony abnormalities are noted. LEFT shoulder arthroplasty changes noted. Review of the MIP images confirms the above findings. CT ABDOMEN and PELVIS FINDINGS Please note that parenchymal and vascular abnormalities may be missed as intravenous contrast was not administered. Hepatobiliary: Cirrhosis identified without definite focal hepatic lesion. The gallbladder is unremarkable. There is no evidence of intrahepatic or extrahepatic biliary dilatation. Pancreas: Unremarkable Spleen: UPPER limits normal spleen size noted. Adrenals/Urinary Tract: Mild perinephric stranding is noted of uncertain chronicity and etiology. No  other renal, adrenal or bladder abnormalities identified. There is no evidence of hydronephrosis. Stomach/Bowel: Stomach is within normal limits. No evidence of bowel wall thickening, distention, or inflammatory changes. Vascular/Lymphatic: Aortic atherosclerosis. No enlarged abdominal or pelvic lymph nodes. Reproductive: Status post hysterectomy. No adnexal masses. Other: No ascites, focal  collection or pneumoperitoneum. Musculoskeletal: No acute or suspicious bony abnormalities are noted. Degenerative changes in the LOWER lumbar spine again noted. Review of the MIP images confirms the above findings. IMPRESSION: 1. Large area of RIGHT LOWER lobe consolidation, likely representing pneumonia. Radiographic follow-up to resolution is recommended. 2. No evidence of pulmonary emboli or thoracic aortic aneurysm. 3. Mild perinephric stranding of uncertain chronicity and etiology, and may represent inflammation or infection. No evidence of hydronephrosis. 4. Cirrhosis and UPPER limits normal spleen size. 5. Cardiomegaly and coronary artery disease. 6.  Aortic aneurysm NOS (ICD10-I71.9). Electronically Signed   By: Margarette Canada M.D.   On: 04/25/2022 17:15        Scheduled Meds:  Buprenorphine HCl   Buccal BID   doxycycline  100 mg Oral Q12H   DULoxetine  30 mg Oral BID   enoxaparin (LOVENOX) injection  40 mg Subcutaneous Q24H   insulin aspart  0-9 Units Subcutaneous TID WC   losartan  100 mg Oral Daily   potassium chloride  40 mEq Oral BID   rosuvastatin  5 mg Oral Daily   sertraline  25 mg Oral Daily   sodium chloride flush  3 mL Intravenous Q12H   tiZANidine  4 mg Oral QHS   Continuous Infusions:  cefTRIAXone (ROCEPHIN)  IV 2 g (04/26/22 2214)     LOS: 1 day    Time spent: 35 minutes    Barb Merino, MD [facility] Pager [phone]

## 2022-04-28 ENCOUNTER — Encounter (HOSPITAL_COMMUNITY): Payer: Self-pay | Admitting: Internal Medicine

## 2022-04-28 LAB — EXPECTORATED SPUTUM ASSESSMENT W GRAM STAIN, RFLX TO RESP C

## 2022-04-28 LAB — GLUCOSE, CAPILLARY: Glucose-Capillary: 135 mg/dL — ABNORMAL HIGH (ref 70–99)

## 2022-04-28 MED ORDER — SODIUM CHLORIDE 0.9 % IV SOLN
2.0000 g | Freq: Once | INTRAVENOUS | Status: AC
  Administered 2022-04-28: 2 g via INTRAVENOUS
  Filled 2022-04-28: qty 20

## 2022-04-28 MED ORDER — DOXYCYCLINE HYCLATE 100 MG PO TBEC: 100.0000 mg | DELAYED_RELEASE_TABLET | Freq: Two times a day (BID) | ORAL | 0 refills | Status: AC

## 2022-04-28 NOTE — Discharge Summary (Signed)
Physician Discharge Summary  Ashley Riddle IWP:[id number] DOB: 1945/11/10 DOA: 04/25/2022  PCP: Audley Hose, MD  Admit date: 04/25/2022 Discharge date: 04/28/2022  Admitted From: Home Disposition: Home  Recommendations for Outpatient Follow-up:  Follow up with PCP in 1-2 weeks Repeat chest x-ray in 3 to 4 weeks to ensure complete resolution.  Home Health: N/A Equipment/Devices: N/A  Discharge Condition: Stable CODE STATUS: Full code Diet recommendation: Low-salt and low-carb diet  Discharge summary: 76 year old with history of hepatitis C and cirrhosis, chronic bronchitis, asthma, anxiety, depression, history of substance abuse currently on Suboxone presented to the emergency room with more than 1 week of shortness of breath, right-sided pleuritic chest pain and sputum with bloody streaks.  In the emergency room temperature 102.7, heart rate 100.  Blood pressures are stable.  COVID-19 and flu negative.  CT angiogram of the chest and abdomen pelvis showed large right lower lobe consolidation consistent with pneumonia, negative for pulmonary embolism.  Cultures obtained.  Started on antibiotics.  # Right lower lobe pneumonia, suspect both gram-positive and gram-negative organism: Sepsis present on admission due to leukocytosis, tachycardia and tachypnea. Symptomatology improved.  Patient was treated with ceftriaxone and doxycycline.  Day 3 of IV antibiotic therapy today.  Good clinical improvement. Blood cultures negative so far. Sputum cultures, collected inadequate. Legionella and streptococcal antigen negative. Due to clinical improvement, patient is going home with doxycycline for additional 4 days to complete 7 days of therapy.  She will continue to chest.  Therapy, over-the-counter mucolytic's.  Given large consolidation on the right lower lobe, she will benefit with repeat chest x-ray in 3 to 4 weeks and this can be arranged through primary care doctor's office.  She  is currently on room air and mobilizing around.   Hepatitis C and cirrhosis: Stable.   Anxiety/depression: On sertraline and Cymbalta.  stable.   Hypertension: Blood pressure stable.  Resume home medications.   Chronic pain syndrome: Patient on buprenorphine and oxycodone.   Peripheral vascular disease, on statin.  Stable.   Hypokalemia: Replaced and adequate.  Stable for discharge.  Discharge Diagnoses:  Principal Problem:   Pneumonia Active Problems:   HEPATITIS C   HYPERTENSION, BENIGN ESSENTIAL   Chronic pain syndrome   Liver cirrhosis (HCC)   Anxiety and depression   DM (diabetes mellitus) ([place])   Chronic bronchitis ([place])   Peripheral vascular disease ([place])   Obesity   Hyponatremia    Discharge Instructions  Discharge Instructions     Call MD for:  difficulty breathing, headache or visual disturbances   Complete by: As directed    Call MD for:  temperature >100.4   Complete by: As directed    Diet - low sodium heart healthy   Complete by: As directed    Diet Carb Modified   Complete by: As directed    Discharge instructions   Complete by: As directed    Continue to do lung exercises at home Can use over the counter cough medications   Increase activity slowly   Complete by: As directed       Allergies as of 04/28/2022       Reactions   Gabapentin Other (See Comments)   Pregabalin    Other reaction(s): Confusion   Aspirin Other (See Comments)   Due to history of hepatitis   Penicillins Hives   Has patient had a PCN reaction causing immediate rash, facial/tongue/throat swelling, SOB or lightheadedness with hypotension: Yes Has patient had a PCN reaction causing severe rash  involving mucus membranes or skin necrosis: No Has patient had a PCN reaction that required hospitalization No Has patient had a PCN reaction occurring within the last 10 years: No If all of the above answers are "NO", then may proceed with Cephalosporin use.         Medication List     TAKE these medications    Accu-Chek Guide test strip Generic drug: glucose blood   acetaminophen 500 MG tablet Commonly known as: TYLENOL Take 1,000 mg by mouth every 8 (eight) hours as needed for moderate pain.   aspirin EC 81 MG tablet Take 81 mg by mouth daily.   B-12 5000 MCG Subl B12  take one tab per day   Buprenorphine HCl 750 MCG Film Take 1 Film by mouth in the morning and at bedtime.   Calcium 600+D Plus Minerals 600-400 MG-UNIT Tabs Calcium 600 + D(3) 600 mg-10 mcg (400 unit) tablet  Take 1 tablet twice a day by oral route for 30 days.   colchicine 0.6 MG tablet Take 0.6 mg by mouth daily as needed (gout flares).   doxycycline 100 MG EC tablet Commonly known as: DORYX Take 1 tablet (100 mg total) by mouth 2 (two) times daily for 7 days.   DULoxetine 30 MG capsule Commonly known as: CYMBALTA Take 30 mg by mouth 2 (two) times daily.   losartan 100 MG tablet Commonly known as: COZAAR Take 1 tablet by mouth daily.   multivitamin with minerals Tabs tablet Take 1 tablet by mouth daily.   naloxone 4 MG/0.1ML Liqd nasal spray kit Commonly known as: NARCAN   oxyCODONE-acetaminophen 10-325 MG tablet Commonly known as: PERCOCET Take 1 tablet by mouth every 4 (four) hours as needed for pain.   rosuvastatin 5 MG tablet Commonly known as: CRESTOR Take 5 mg by mouth daily.   sertraline 50 MG tablet Commonly known as: ZOLOFT Take 50 mg by mouth daily.   sitaGLIPtin 50 MG tablet Commonly known as: JANUVIA Take 50 mg by mouth every morning.   tiZANidine 4 MG tablet Commonly known as: ZANAFLEX Take 4 mg by mouth at bedtime. What changed: Another medication with the same name was removed. Continue taking this medication, and follow the directions you see here.        Allergies  Allergen Reactions   Gabapentin Other (See Comments)   Pregabalin     Other reaction(s): Confusion   Aspirin Other (See Comments)    Due to history of  hepatitis   Penicillins Hives    Has patient had a PCN reaction causing immediate rash, facial/tongue/throat swelling, SOB or lightheadedness with hypotension: Yes Has patient had a PCN reaction causing severe rash involving mucus membranes or skin necrosis: No Has patient had a PCN reaction that required hospitalization No Has patient had a PCN reaction occurring within the last 10 years: No If all of the above answers are "NO", then may proceed with Cephalosporin use.     Consultations: None   Procedures/Studies: CT ABDOMEN PELVIS W CONTRAST  Result Date: 04/25/2022 CLINICAL DATA:  Sepsis, right flank pain EXAM: CT ABDOMEN AND PELVIS WITH CONTRAST TECHNIQUE: Multidetector CT imaging of the abdomen and pelvis was performed using the standard protocol following bolus administration of intravenous contrast. RADIATION DOSE REDUCTION: This exam was performed according to the departmental dose-optimization program which includes automated exposure control, adjustment of the mA and/or kV according to patient size and/or use of iterative reconstruction technique. CONTRAST:  29m OMNIPAQUE IOHEXOL 350 MG/ML SOLN COMPARISON:  Same-day CT abdomen pelvis, 04/25/2022, 4:36 p.m. FINDINGS: Lower chest: Large, very dense consolidation of the partially included right lower lobe (series 4, image 1). Hepatobiliary: No solid liver abnormality is seen. Somewhat coarse contour of the liver, particularly of the left lobe (series 3, image 17). No gallstones, gallbladder wall thickening, or biliary dilatation. Pancreas: Unremarkable. No pancreatic ductal dilatation or surrounding inflammatory changes. Spleen: Normal in size without significant abnormality. Adrenals/Urinary Tract: Adrenal glands are unremarkable. Excreted contrast in the renal collecting systems and bladder. Kidneys are normal, without renal calculi, solid lesion, or hydronephrosis. Bladder is unremarkable. Stomach/Bowel: Stomach is within normal limits.  Appendix is not clearly visualized and may be surgically absent. No evidence of bowel wall thickening, distention, or inflammatory changes. Occasional sigmoid diverticula. Vascular/Lymphatic: Scattered aortic atherosclerosis. No enlarged abdominal or pelvic lymph nodes. Reproductive: Status post hysterectomy. Other: No abdominal wall hernia or abnormality. No ascites. Musculoskeletal: No acute or significant osseous findings. IMPRESSION: 1. No acute CT findings of the abdomen or pelvis to explain right flank pain. No evidence of urinary tract calculus or hydronephrosis. There is again seen minimal perinephric fat stranding, of uncertain chronicity or significance, without renal cortical hypoenhancement or other specific findings to suggest pyelonephritis. 2. Large, very dense consolidation of the partially included right lower lobe, better included on prior dedicated imaging of the chest and most consistent with infection or aspiration. 3. Somewhat coarse contour of the liver, suggestive of cirrhosis. Correlate with biochemical findings. Aortic Atherosclerosis (ICD10-I70.0). Electronically Signed   By: Delanna Ahmadi M.D.   On: 04/25/2022 18:21   CT Angio Chest PE W and/or Wo Contrast  Result Date: 04/25/2022 CLINICAL DATA:  76 year old female with chest, abdominal and pelvic pain and possible sepsis. EXAM: CT ABDOMEN AND PELVIS WITHOUT CONTRAST CT ANGIOGRAPHY CHEST TECHNIQUE: Multidetector CT imaging of the abdomen and pelvis was performed using the standard protocol without intravenous contrast. Multidetector CT imaging of the chest was performed using the standard protocol during bolus administration of intravenous contrast. Multiplanar CT image reconstructions and MIPs were obtained to evaluate the vascular anatomy. RADIATION DOSE REDUCTION: This exam was performed according to the departmental dose-optimization program which includes automated exposure control, adjustment of the mA and/or kV according to  patient size and/or use of iterative reconstruction technique. CONTRAST:  47m OMNIPAQUE IOHEXOL 350 MG/ML SOLN COMPARISON:  04/19/2020 CTs FINDINGS: CTA CHEST FINDINGS Cardiovascular: This is a technically satisfactory study but respiratory motion artifact decreases sensitivity and some portions of the lungs. No pulmonary emboli are identified. Mild cardiomegaly is present. Coronary artery and aortic atherosclerotic calcifications noted. There is no evidence of thoracic aortic aneurysm or pericardial effusion. Mediastinum/Nodes: No enlarged mediastinal, hilar, or axillary lymph nodes. Thyroid gland, trachea, and esophagus demonstrate no significant findings. Lungs/Pleura: Consolidation throughout a large portion of the RIGHT LOWER lobe is noted, likely representing pneumonia. No other airspace disease, consolidation, pleural effusion or pneumothorax noted. Musculoskeletal: No acute or suspicious bony abnormalities are noted. LEFT shoulder arthroplasty changes noted. Review of the MIP images confirms the above findings. CT ABDOMEN and PELVIS FINDINGS Please note that parenchymal and vascular abnormalities may be missed as intravenous contrast was not administered. Hepatobiliary: Cirrhosis identified without definite focal hepatic lesion. The gallbladder is unremarkable. There is no evidence of intrahepatic or extrahepatic biliary dilatation. Pancreas: Unremarkable Spleen: UPPER limits normal spleen size noted. Adrenals/Urinary Tract: Mild perinephric stranding is noted of uncertain chronicity and etiology. No other renal, adrenal or bladder abnormalities identified. There is no evidence of hydronephrosis. Stomach/Bowel: Stomach is  within normal limits. No evidence of bowel wall thickening, distention, or inflammatory changes. Vascular/Lymphatic: Aortic atherosclerosis. No enlarged abdominal or pelvic lymph nodes. Reproductive: Status post hysterectomy. No adnexal masses. Other: No ascites, focal collection or  pneumoperitoneum. Musculoskeletal: No acute or suspicious bony abnormalities are noted. Degenerative changes in the LOWER lumbar spine again noted. Review of the MIP images confirms the above findings. IMPRESSION: 1. Large area of RIGHT LOWER lobe consolidation, likely representing pneumonia. Radiographic follow-up to resolution is recommended. 2. No evidence of pulmonary emboli or thoracic aortic aneurysm. 3. Mild perinephric stranding of uncertain chronicity and etiology, and may represent inflammation or infection. No evidence of hydronephrosis. 4. Cirrhosis and UPPER limits normal spleen size. 5. Cardiomegaly and coronary artery disease. 6.  Aortic aneurysm NOS (ICD10-I71.9). Electronically Signed   By: Margarette Canada M.D.   On: 04/25/2022 17:15   CT Renal Stone Study  Result Date: 04/25/2022 CLINICAL DATA:  76 year old female with chest, abdominal and pelvic pain and possible sepsis. EXAM: CT ABDOMEN AND PELVIS WITHOUT CONTRAST CT ANGIOGRAPHY CHEST TECHNIQUE: Multidetector CT imaging of the abdomen and pelvis was performed using the standard protocol without intravenous contrast. Multidetector CT imaging of the chest was performed using the standard protocol during bolus administration of intravenous contrast. Multiplanar CT image reconstructions and MIPs were obtained to evaluate the vascular anatomy. RADIATION DOSE REDUCTION: This exam was performed according to the departmental dose-optimization program which includes automated exposure control, adjustment of the mA and/or kV according to patient size and/or use of iterative reconstruction technique. CONTRAST:  30m OMNIPAQUE IOHEXOL 350 MG/ML SOLN COMPARISON:  04/19/2020 CTs FINDINGS: CTA CHEST FINDINGS Cardiovascular: This is a technically satisfactory study but respiratory motion artifact decreases sensitivity and some portions of the lungs. No pulmonary emboli are identified. Mild cardiomegaly is present. Coronary artery and aortic atherosclerotic  calcifications noted. There is no evidence of thoracic aortic aneurysm or pericardial effusion. Mediastinum/Nodes: No enlarged mediastinal, hilar, or axillary lymph nodes. Thyroid gland, trachea, and esophagus demonstrate no significant findings. Lungs/Pleura: Consolidation throughout a large portion of the RIGHT LOWER lobe is noted, likely representing pneumonia. No other airspace disease, consolidation, pleural effusion or pneumothorax noted. Musculoskeletal: No acute or suspicious bony abnormalities are noted. LEFT shoulder arthroplasty changes noted. Review of the MIP images confirms the above findings. CT ABDOMEN and PELVIS FINDINGS Please note that parenchymal and vascular abnormalities may be missed as intravenous contrast was not administered. Hepatobiliary: Cirrhosis identified without definite focal hepatic lesion. The gallbladder is unremarkable. There is no evidence of intrahepatic or extrahepatic biliary dilatation. Pancreas: Unremarkable Spleen: UPPER limits normal spleen size noted. Adrenals/Urinary Tract: Mild perinephric stranding is noted of uncertain chronicity and etiology. No other renal, adrenal or bladder abnormalities identified. There is no evidence of hydronephrosis. Stomach/Bowel: Stomach is within normal limits. No evidence of bowel wall thickening, distention, or inflammatory changes. Vascular/Lymphatic: Aortic atherosclerosis. No enlarged abdominal or pelvic lymph nodes. Reproductive: Status post hysterectomy. No adnexal masses. Other: No ascites, focal collection or pneumoperitoneum. Musculoskeletal: No acute or suspicious bony abnormalities are noted. Degenerative changes in the LOWER lumbar spine again noted. Review of the MIP images confirms the above findings. IMPRESSION: 1. Large area of RIGHT LOWER lobe consolidation, likely representing pneumonia. Radiographic follow-up to resolution is recommended. 2. No evidence of pulmonary emboli or thoracic aortic aneurysm. 3. Mild  perinephric stranding of uncertain chronicity and etiology, and may represent inflammation or infection. No evidence of hydronephrosis. 4. Cirrhosis and UPPER limits normal spleen size. 5. Cardiomegaly and coronary artery disease.  6.  Aortic aneurysm NOS (ICD10-I71.9). Electronically Signed   By: Margarette Canada M.D.   On: 04/25/2022 17:15   DG Chest 2 View  Result Date: 04/11/2022 CLINICAL DATA:  COPD EXAM: CHEST - 2 VIEW COMPARISON:  Chest radiograph 04/19/2020 FINDINGS: Stable cardiac and mediastinal contours. Aortic tortuosity. No large area pulmonary consolidation. No pleural effusion or pneumothorax. Thoracic spine degenerative changes. Left shoulder arthroplasty. IMPRESSION: No active cardiopulmonary disease. Electronically Signed   By: Lovey Newcomer M.D.   On: 04/11/2022 10:58   (Echo, Carotid, EGD, Colonoscopy, ERCP)    Subjective: Patient seen and examined.  Denies any complaints.  Pain is controlled.  Coughing mostly mucoid sputum now.  No more bloody streaks in the sputum.  Eager to go home.  Up about and walking.  Afebrile.   Discharge Exam: Vitals:   04/28/22 0350 04/28/22 0738  BP: (!) 175/88 (!) 185/110  Pulse: 89 (!) 104  Resp: 15 16  Temp: 98.1 F (36.7 C) 98.3 F (36.8 C)  SpO2: 99% 96%   Vitals:   04/27/22 1946 04/28/22 0350 04/28/22 0454 04/28/22 0738  BP: (!) 178/106 (!) 175/88  (!) 185/110  Pulse: 100 89  (!) 104  Resp: _0 Temp: 99.1 F (37.3 C) 98.1 F (36.7 C)  98.3 F (36.8 C)  TempSrc: Oral Oral  Oral  SpO2: 98% 99%  96%  Weight:   89.3 kg   Height:        General: Pt is alert, awake, not in acute distress Cardiovascular: RRR, S1/S2 +, no rubs, no gallops Respiratory: CTA bilaterally, no wheezing, no rhonchi Abdominal: Soft, NT, ND, bowel sounds + Extremities: no edema, no cyanosis    The results of significant diagnostics from this hospitalization (including imaging, microbiology, ancillary and laboratory) are listed below for reference.      Microbiology: Recent Results (from the past 240 hour(s))  Resp Panel by RT-PCR (Flu A&B, Covid) Anterior Nasal Swab     Status: None   Collection Time: 04/25/22 11:12 AM   Specimen: Anterior Nasal Swab  Result Value Ref Range Status   SARS Coronavirus 2 by RT PCR NEGATIVE NEGATIVE Final    Comment: (NOTE) SARS-CoV-2 target nucleic acids are NOT DETECTED.  The SARS-CoV-2 RNA is generally detectable in upper respiratory specimens during the acute phase of infection. The lowest concentration of SARS-CoV-2 viral copies this assay can detect is 138 copies/mL. A negative result does not preclude SARS-Cov-2 infection and should not be used as the sole basis for treatment or other patient management decisions. A negative result may occur with  improper specimen collection/handling, submission of specimen other than nasopharyngeal swab, presence of viral mutation(s) within the areas targeted by this assay, and inadequate number of viral copies(<138 copies/mL). A negative result must be combined with clinical observations, patient history, and epidemiological information. The expected result is Negative.  Fact Sheet for Patients:  EntrepreneurPulse.com.au  Fact Sheet for Healthcare Providers:  IncredibleEmployment.be  This test is no t yet approved or cleared by the [place] FDA and  has been authorized for detection and/or diagnosis of SARS-CoV-2 by FDA under an Emergency Use Authorization (EUA). This EUA will remain  in effect (meaning this test can be used) for the duration of the COVID-19 declaration under Section 564(b)(1) of the Act, 21 U.S.C.section 360bbb-3(b)(1), unless the authorization is terminated  or revoked sooner.       Influenza A by PCR NEGATIVE NEGATIVE Final   Influenza B by PCR  NEGATIVE NEGATIVE Final    Comment: (NOTE) The Xpert Xpress SARS-CoV-2/FLU/RSV plus assay is intended as an aid in the diagnosis of influenza  from Nasopharyngeal swab specimens and should not be used as a sole basis for treatment. Nasal washings and aspirates are unacceptable for Xpert Xpress SARS-CoV-2/FLU/RSV testing.  Fact Sheet for Patients: EntrepreneurPulse.com.au  Fact Sheet for Healthcare Providers: IncredibleEmployment.be  This test is not yet approved or cleared by the [place] FDA and has been authorized for detection and/or diagnosis of SARS-CoV-2 by FDA under an Emergency Use Authorization (EUA). This EUA will remain in effect (meaning this test can be used) for the duration of the COVID-19 declaration under Section 564(b)(1) of the Act, 21 U.S.C. section 360bbb-3(b)(1), unless the authorization is terminated or revoked.  Performed at [facility], [place] [address], [place], [place] [address]   Urine Culture     Status: None   Collection Time: 04/25/22  4:33 PM   Specimen: In/Out Cath Urine  Result Value Ref Range Status   Specimen Description IN/OUT CATH URINE  Final   Special Requests NONE  Final   Culture   Final    NO GROWTH Performed at [facility], [place] [address] [place], [place] [address]    Report Status 04/26/2022 FINAL  Final  Blood Culture (routine x 2)     Status: None (Preliminary result)   Collection Time: 04/25/22  4:38 PM   Specimen: BLOOD  Result Value Ref Range Status   Specimen Description BLOOD SITE NOT SPECIFIED  Final   Special Requests   Final    BOTTLES DRAWN AEROBIC AND ANAEROBIC Blood Culture results may not be optimal due to an inadequate volume of blood received in culture bottles   Culture   Final    NO GROWTH 3 DAYS Performed at [facility], [place] [address], [place], [place] [address]    Report Status PENDING  Incomplete  Blood Culture (routine x 2)     Status: None (Preliminary result)   Collection Time: 04/25/22  5:26 PM   Specimen: BLOOD  Result Value Ref Range Status   Specimen Description BLOOD SITE NOT  SPECIFIED  Final   Special Requests   Final    BOTTLES DRAWN AEROBIC AND ANAEROBIC Blood Culture adequate volume   Culture   Final    NO GROWTH 3 DAYS Performed at [facility], [address] [place], [place] [address]    Report Status PENDING  Incomplete  MRSA Next Gen by PCR, Nasal     Status: None   Collection Time: 04/25/22  9:01 PM   Specimen: Nasal Mucosa; Nasal Swab  Result Value Ref Range Status   MRSA by PCR Next Gen NOT DETECTED NOT DETECTED Final    Comment: (NOTE) The GeneXpert MRSA Assay (FDA approved for NASAL specimens only), is one component of a comprehensive MRSA colonization surveillance program. It is not intended to diagnose MRSA infection nor to guide or monitor treatment for MRSA infections. Test performance is not FDA approved in patients less than 4 years old. Performed at [facility], [place] [address] [place], [place] [address]   Expectorated Sputum Assessment w Gram Stain, Rflx to Resp Cult     Status: None (Preliminary result)   Collection Time: 04/26/22  2:00 PM   Specimen: Expectorated Sputum  Result Value Ref Range Status   Specimen Description EXPECTORATED SPUTUM  Final   Special Requests NONE  Final  Sputum evaluation   Final    Sputum specimen not acceptable for testing.  Please recollect.   RESULT CALLED TO, READ BACK BY AND VERIFIED WITH: R BARNES,RN_0  04/26/22 [place] Performed at [facility], [place] [address], [place], [place] [address]    Report Status PENDING  Incomplete  Expectorated Sputum Assessment w Gram Stain, Rflx to Resp Cult     Status: None (Preliminary result)   Collection Time: 04/27/22  8:19 AM   Specimen: Expectorated Sputum  Result Value Ref Range Status   Specimen Description EXPECTORATED SPUTUM  Final   Special Requests NONE  Final   Sputum evaluation   Final    Sputum specimen not acceptable for testing.  Please recollect.    C/ K. CLINE, RN 04/27/22 1420 A. LAFRANCE Performed at [facility], [place] [address], [place], [place] [address]    Report Status PENDING  Incomplete     Labs: BNP (last 3 results) Recent Labs    04/25/22 1151  BNP 347.4*   Basic Metabolic Panel: Recent Labs  Lab 04/25/22 1236 04/25/22 2117 04/26/22 0014 04/26/22 0642 04/27/22 0412  NA 129* 136 132* 134* 133*  K 3.9 3.8 3.8 3.3* 3.8  CL 100 96* 98 100 98  CO2 17* _1 19*  GLUCOSE 140* 114* 143* 114* 158*  BUN _2 CREATININE 0.78 0.58 0.68 0.61 0.54  CALCIUM 8.8* 9.9 9.1 9.1 9.4  MG  --   --   --   --  1.9   Liver Function Tests: Recent Labs  Lab 04/25/22 1236  AST 21  ALT 12  ALKPHOS 68  BILITOT 0.9  PROT 6.8  ALBUMIN 3.1*   No results for input(s): "LIPASE", "AMYLASE" in the last 168 hours. No results for input(s): "AMMONIA" in the last 168 hours. CBC: Recent Labs  Lab 04/25/22 1151 04/26/22 0014 04/27/22 0412  WBC 11.7* 11.2* 5.3  NEUTROABS 10.0*  --  3.8  HGB 10.1* 8.7* 8.9*  HCT 31.7* 27.0* 26.9*  MCV 90.1 86.8 85.9  PLT 159 175 163   Cardiac Enzymes: No results for input(s): "CKTOTAL", "CKMB", "CKMBINDEX", "TROPONINI" in the last 168 hours. BNP: Invalid input(s): "POCBNP" CBG: Recent Labs  Lab 04/27/22 0756 04/27/22 1147 04/27/22 1604 04/27/22 2128 04/28/22 0739  GLUCAP 98 157* 105* 158* 135*   D-Dimer No results for input(s): "DDIMER" in the last 72 hours. Hgb A1c No results for input(s): "HGBA1C" in the last 72 hours. Lipid Profile No results for input(s): "CHOL", "HDL", "LDLCALC", "TRIG", "CHOLHDL", "LDLDIRECT" in the last 72 hours. Thyroid function studies No results for input(s): "TSH", "T4TOTAL", "T3FREE", "THYROIDAB" in the last 72 hours.  Invalid input(s): "FREET3" Anemia work up No results for input(s): "VITAMINB12", "FOLATE", "FERRITIN", "TIBC", "IRON", "RETICCTPCT" in the last 72 hours. Urinalysis    Component Value Date/Time   COLORURINE YELLOW 04/25/2022 1224   APPEARANCEUR CLEAR 04/25/2022 1224   LABSPEC  1.023 04/25/2022 1224   PHURINE 5.0 04/25/2022 1224   GLUCOSEU NEGATIVE 04/25/2022 1224   HGBUR MODERATE (A) 04/25/2022 1224   BILIRUBINUR NEGATIVE 04/25/2022 1224   KETONESUR NEGATIVE 04/25/2022 1224   PROTEINUR 100 (A) 04/25/2022 1224   UROBILINOGEN 0.2 08/20/2014 0554   NITRITE NEGATIVE 04/25/2022 1224   LEUKOCYTESUR NEGATIVE 04/25/2022 1224   Sepsis Labs Recent Labs  Lab 04/25/22 1151 04/26/22 0014 04/27/22 0412  WBC 11.7* 11.2* 5.3   Microbiology Recent Results (from the past 240 hour(s))  Resp Panel by  RT-PCR (Flu A&B, Covid) Anterior Nasal Swab     Status: None   Collection Time: 04/25/22 11:12 AM   Specimen: Anterior Nasal Swab  Result Value Ref Range Status   SARS Coronavirus 2 by RT PCR NEGATIVE NEGATIVE Final    Comment: (NOTE) SARS-CoV-2 target nucleic acids are NOT DETECTED.  The SARS-CoV-2 RNA is generally detectable in upper respiratory specimens during the acute phase of infection. The lowest concentration of SARS-CoV-2 viral copies this assay can detect is 138 copies/mL. A negative result does not preclude SARS-Cov-2 infection and should not be used as the sole basis for treatment or other patient management decisions. A negative result may occur with  improper specimen collection/handling, submission of specimen other than nasopharyngeal swab, presence of viral mutation(s) within the areas targeted by this assay, and inadequate number of viral copies(<138 copies/mL). A negative result must be combined with clinical observations, patient history, and epidemiological information. The expected result is Negative.  Fact Sheet for Patients:  EntrepreneurPulse.com.au  Fact Sheet for Healthcare Providers:  IncredibleEmployment.be  This test is no t yet approved or cleared by the [place] FDA and  has been authorized for detection and/or diagnosis of SARS-CoV-2 by FDA under an Emergency Use Authorization (EUA). This  EUA will remain  in effect (meaning this test can be used) for the duration of the COVID-19 declaration under Section 564(b)(1) of the Act, 21 U.S.C.section 360bbb-3(b)(1), unless the authorization is terminated  or revoked sooner.       Influenza A by PCR NEGATIVE NEGATIVE Final   Influenza B by PCR NEGATIVE NEGATIVE Final    Comment: (NOTE) The Xpert Xpress SARS-CoV-2/FLU/RSV plus assay is intended as an aid in the diagnosis of influenza from Nasopharyngeal swab specimens and should not be used as a sole basis for treatment. Nasal washings and aspirates are unacceptable for Xpert Xpress SARS-CoV-2/FLU/RSV testing.  Fact Sheet for Patients: EntrepreneurPulse.com.au  Fact Sheet for Healthcare Providers: IncredibleEmployment.be  This test is not yet approved or cleared by the [place] FDA and has been authorized for detection and/or diagnosis of SARS-CoV-2 by FDA under an Emergency Use Authorization (EUA). This EUA will remain in effect (meaning this test can be used) for the duration of the COVID-19 declaration under Section 564(b)(1) of the Act, 21 U.S.C. section 360bbb-3(b)(1), unless the authorization is terminated or revoked.  Performed at [facility], [place] [address], [place], [place] [address]   Urine Culture     Status: None   Collection Time: 04/25/22  4:33 PM   Specimen: In/Out Cath Urine  Result Value Ref Range Status   Specimen Description IN/OUT CATH URINE  Final   Special Requests NONE  Final   Culture   Final    NO GROWTH Performed at [facility], [place] [address], [place], [place] [address]    Report Status 04/26/2022 FINAL  Final  Blood Culture (routine x 2)     Status: None (Preliminary result)   Collection Time: 04/25/22  4:38 PM   Specimen: BLOOD  Result Value Ref Range Status   Specimen Description BLOOD SITE NOT SPECIFIED  Final   Special Requests   Final    BOTTLES DRAWN AEROBIC AND ANAEROBIC  Blood Culture results may not be optimal due to an inadequate volume of blood received in culture bottles   Culture   Final    NO GROWTH 3 DAYS Performed at [facility], [place] [address] [place], [place] [address]  Report Status PENDING  Incomplete  Blood Culture (routine x 2)     Status: None (Preliminary result)   Collection Time: 04/25/22  5:26 PM   Specimen: BLOOD  Result Value Ref Range Status   Specimen Description BLOOD SITE NOT SPECIFIED  Final   Special Requests   Final    BOTTLES DRAWN AEROBIC AND ANAEROBIC Blood Culture adequate volume   Culture   Final    NO GROWTH 3 DAYS Performed at [facility], [address] [place], [place] [address]    Report Status PENDING  Incomplete  MRSA Next Gen by PCR, Nasal     Status: None   Collection Time: 04/25/22  9:01 PM   Specimen: Nasal Mucosa; Nasal Swab  Result Value Ref Range Status   MRSA by PCR Next Gen NOT DETECTED NOT DETECTED Final    Comment: (NOTE) The GeneXpert MRSA Assay (FDA approved for NASAL specimens only), is one component of a comprehensive MRSA colonization surveillance program. It is not intended to diagnose MRSA infection nor to guide or monitor treatment for MRSA infections. Test performance is not FDA approved in patients less than 62 years old. Performed at [facility], [place] [address] [place], [place] [address]   Expectorated Sputum Assessment w Gram Stain, Rflx to Resp Cult     Status: None (Preliminary result)   Collection Time: 04/26/22  2:00 PM   Specimen: Expectorated Sputum  Result Value Ref Range Status   Specimen Description EXPECTORATED SPUTUM  Final   Special Requests NONE  Final   Sputum evaluation   Final    Sputum specimen not acceptable for testing.  Please recollect.   RESULT CALLED TO, READ BACK BY AND VERIFIED WITH: R BARNES,RN_0  04/26/22 Vona Performed at [facility], [place] [address] [place], [place] [address]    Report Status PENDING  Incomplete   Expectorated Sputum Assessment w Gram Stain, Rflx to Resp Cult     Status: None (Preliminary result)   Collection Time: 04/27/22  8:19 AM   Specimen: Expectorated Sputum  Result Value Ref Range Status   Specimen Description EXPECTORATED SPUTUM  Final   Special Requests NONE  Final   Sputum evaluation   Final    Sputum specimen not acceptable for testing.  Please recollect.    C/ K. CLINE, RN 04/27/22 1420 A. LAFRANCE Performed at [facility], [place] [address] [place], [place] [address]    Report Status PENDING  Incomplete     Time coordinating discharge: 35  minutes  SIGNED:   Barb Merino, MD  [facility] 04/28/2022, 8:46 AM

## 2022-04-28 NOTE — Plan of Care (Signed)
  Problem: Activity: Goal: Ability to tolerate increased activity will improve Outcome: Completed/Met   Problem: Clinical Measurements: Goal: Ability to maintain a body temperature in the normal range will improve Outcome: Completed/Met   Problem: Respiratory: Goal: Ability to maintain adequate ventilation will improve Outcome: Completed/Met Goal: Ability to maintain a clear airway will improve Outcome: Completed/Met   Problem: Education: Goal: Ability to describe self-care measures that may prevent or decrease complications (Diabetes Survival Skills Education) will improve Outcome: Completed/Met Goal: Individualized Educational Video(s) Outcome: Completed/Met   Problem: Coping: Goal: Ability to adjust to condition or change in health will improve Outcome: Completed/Met   Problem: Fluid Volume: Goal: Ability to maintain a balanced intake and output will improve Outcome: Completed/Met   Problem: Health Behavior/Discharge Planning: Goal: Ability to identify and utilize available resources and services will improve Outcome: Completed/Met Goal: Ability to manage health-related needs will improve Outcome: Completed/Met   Problem: Metabolic: Goal: Ability to maintain appropriate glucose levels will improve Outcome: Completed/Met   Problem: Nutritional: Goal: Maintenance of adequate nutrition will improve Outcome: Completed/Met Goal: Progress toward achieving an optimal weight will improve Outcome: Completed/Met   Problem: Skin Integrity: Goal: Risk for impaired skin integrity will decrease Outcome: Completed/Met   Problem: Tissue Perfusion: Goal: Adequacy of tissue perfusion will improve Outcome: Completed/Met   Problem: Education: Goal: Knowledge of General Education information will improve Description: Including pain rating scale, medication(s)/side effects and non-pharmacologic comfort measures Outcome: Completed/Met   Problem: Health Behavior/Discharge  Planning: Goal: Ability to manage health-related needs will improve Outcome: Completed/Met   Problem: Clinical Measurements: Goal: Ability to maintain clinical measurements within normal limits will improve Outcome: Completed/Met Goal: Will remain free from infection Outcome: Completed/Met Goal: Diagnostic test results will improve Outcome: Completed/Met Goal: Respiratory complications will improve Outcome: Completed/Met Goal: Cardiovascular complication will be avoided Outcome: Completed/Met   Problem: Activity: Goal: Risk for activity intolerance will decrease Outcome: Completed/Met   Problem: Nutrition: Goal: Adequate nutrition will be maintained Outcome: Completed/Met   Problem: Coping: Goal: Level of anxiety will decrease Outcome: Completed/Met   Problem: Elimination: Goal: Will not experience complications related to bowel motility Outcome: Completed/Met Goal: Will not experience complications related to urinary retention Outcome: Completed/Met   Problem: Pain Managment: Goal: General experience of comfort will improve Outcome: Completed/Met   Problem: Safety: Goal: Ability to remain free from injury will improve Outcome: Completed/Met   Problem: Skin Integrity: Goal: Risk for impaired skin integrity will decrease Outcome: Completed/Met

## 2022-04-29 LAB — LEGIONELLA PNEUMOPHILA SEROGP 1 UR AG: L. pneumophila Serogp 1 Ur Ag: NEGATIVE

## 2022-04-30 LAB — CULTURE, BLOOD (ROUTINE X 2)
Culture: NO GROWTH
Culture: NO GROWTH
Special Requests: ADEQUATE

## 2022-05-03 DIAGNOSIS — Z122 Encounter for screening for malignant neoplasm of respiratory organs: Secondary | ICD-10-CM | POA: Diagnosis not present

## 2022-05-03 DIAGNOSIS — I10 Essential (primary) hypertension: Secondary | ICD-10-CM | POA: Diagnosis not present

## 2022-05-03 DIAGNOSIS — J440 Chronic obstructive pulmonary disease with acute lower respiratory infection: Secondary | ICD-10-CM | POA: Diagnosis not present

## 2022-05-03 DIAGNOSIS — E1165 Type 2 diabetes mellitus with hyperglycemia: Secondary | ICD-10-CM | POA: Diagnosis not present

## 2022-05-03 DIAGNOSIS — Z7689 Persons encountering health services in other specified circumstances: Secondary | ICD-10-CM | POA: Diagnosis not present

## 2022-05-03 DIAGNOSIS — R051 Acute cough: Secondary | ICD-10-CM | POA: Diagnosis not present

## 2022-05-03 DIAGNOSIS — Z23 Encounter for immunization: Secondary | ICD-10-CM | POA: Diagnosis not present

## 2022-05-03 DIAGNOSIS — D6489 Other specified anemias: Secondary | ICD-10-CM | POA: Diagnosis not present

## 2022-05-08 DIAGNOSIS — M792 Neuralgia and neuritis, unspecified: Secondary | ICD-10-CM | POA: Diagnosis not present

## 2022-05-08 DIAGNOSIS — M25561 Pain in right knee: Secondary | ICD-10-CM | POA: Diagnosis not present

## 2022-05-08 DIAGNOSIS — G894 Chronic pain syndrome: Secondary | ICD-10-CM | POA: Diagnosis not present

## 2022-05-08 DIAGNOSIS — M25562 Pain in left knee: Secondary | ICD-10-CM | POA: Diagnosis not present

## 2022-05-09 DIAGNOSIS — I10 Essential (primary) hypertension: Secondary | ICD-10-CM | POA: Diagnosis not present

## 2022-05-09 DIAGNOSIS — D6489 Other specified anemias: Secondary | ICD-10-CM | POA: Diagnosis not present

## 2022-05-15 ENCOUNTER — Inpatient Hospital Stay: Admission: RE | Admit: 2022-05-15 | Payer: Medicare Other | Source: Ambulatory Visit

## 2022-05-16 DIAGNOSIS — G5601 Carpal tunnel syndrome, right upper limb: Secondary | ICD-10-CM | POA: Diagnosis not present

## 2022-05-22 ENCOUNTER — Ambulatory Visit: Payer: Medicare Other | Attending: Speech Pathology | Admitting: Speech Pathology

## 2022-05-31 DIAGNOSIS — G5601 Carpal tunnel syndrome, right upper limb: Secondary | ICD-10-CM | POA: Diagnosis not present

## 2022-06-07 ENCOUNTER — Ambulatory Visit: Payer: Medicare Other | Admitting: Pulmonary Disease

## 2022-06-11 ENCOUNTER — Encounter: Payer: Self-pay | Admitting: Podiatry

## 2022-06-11 ENCOUNTER — Ambulatory Visit (INDEPENDENT_AMBULATORY_CARE_PROVIDER_SITE_OTHER): Payer: Medicare Other | Admitting: Podiatry

## 2022-06-11 VITALS — BP 125/64

## 2022-06-11 DIAGNOSIS — M79674 Pain in right toe(s): Secondary | ICD-10-CM

## 2022-06-11 DIAGNOSIS — M79675 Pain in left toe(s): Secondary | ICD-10-CM | POA: Diagnosis not present

## 2022-06-11 DIAGNOSIS — E1151 Type 2 diabetes mellitus with diabetic peripheral angiopathy without gangrene: Secondary | ICD-10-CM | POA: Diagnosis not present

## 2022-06-11 DIAGNOSIS — L84 Corns and callosities: Secondary | ICD-10-CM | POA: Diagnosis not present

## 2022-06-11 DIAGNOSIS — B351 Tinea unguium: Secondary | ICD-10-CM

## 2022-06-11 DIAGNOSIS — D6489 Other specified anemias: Secondary | ICD-10-CM | POA: Diagnosis not present

## 2022-06-11 DIAGNOSIS — I10 Essential (primary) hypertension: Secondary | ICD-10-CM | POA: Diagnosis not present

## 2022-06-11 DIAGNOSIS — E1165 Type 2 diabetes mellitus with hyperglycemia: Secondary | ICD-10-CM | POA: Diagnosis not present

## 2022-06-11 NOTE — Progress Notes (Signed)
Subjective:  Patient ID: Ashley Riddle, female    DOB: 03/16/1946,  MRN: [id number]  Ashley Riddle presents to clinic today for at risk foot care. Pt has h/o NIDDM with PAD and callus(es) both feet and painful thick toenails that are difficult to trim. Painful toenails interfere with ambulation. Aggravating factors include wearing enclosed shoe gear. Pain is relieved with periodic professional debridement. Painful calluses are aggravated when weightbearing with and without shoegear. Pain is relieved with periodic professional debridement.  Chief Complaint  Patient presents with   Nail Problem    [facility] BS-123 A1C-6.8 PCP-Bakare PCP VST-05/2022   New problem(s): None.   PCP is Bakare, Mobolaji B, MD.  Allergies  Allergen Reactions   Gabapentin Other (See Comments)   Pregabalin     Other reaction(s): Confusion   Aspirin Other (See Comments)    Due to history of hepatitis   Penicillins Hives    Has patient had a PCN reaction causing immediate rash, facial/tongue/throat swelling, SOB or lightheadedness with hypotension: Yes Has patient had a PCN reaction causing severe rash involving mucus membranes or skin necrosis: No Has patient had a PCN reaction that required hospitalization No Has patient had a PCN reaction occurring within the last 10 years: No If all of the above answers are "NO", then may proceed with Cephalosporin use.     Review of Systems: Negative except as noted in the HPI.  Objective: No changes noted in today's physical examination. Vitals:   06/11/22 1122  BP: 125/64   Ashley Riddle is a pleasant 77 y.o. female WD, WN in NAD. AAO x 3.  Vascular Examination: CFT <3 seconds b/l LE. Palpable PT pulse(s) b/l LE. Palpable DP pulse(s) right lower extremity. Diminished DP pulse(s) left lower extremity. Pedal hair absent. No pain with calf compression b/l. Lower extremity skin temperature gradient within normal limits. No edema noted b/l LE.  Varicosities present b/l. No ischemia or gangrene noted b/l LE. No cyanosis or clubbing noted b/l LE.  Dermatological Examination: Pedal skin thin and atrophic b/l LE. No open wounds b/l LE. No interdigital macerations noted b/l LE.   Toenails 2-5 bilaterally and left great toe elongated, discolored, dystrophic, thickened, and crumbly with subungual debris and tenderness to dorsal palpation. Anonychia noted right great toe. Nailbed(s) epithelialized.   Hyperkeratotic lesion(s) submet head 5 b/l with tenderness to palpation. No edema, no erythema, no drainage, no fluctuance.   Nevi noted plantar aspect of right heel x 2 and central arch area left foot. Unchanged.  Neurological Examination: Pt has subjective symptoms of neuropathy. Protective sensation intact 5/5 intact bilaterally with 10g monofilament b/l. Vibratory sensation intact b/l.  Musculoskeletal Examination: Muscle strength 5/5 to all lower extremity muscle groups bilaterally. HAV with bunion deformity noted b/l LE.  Assessment/Plan: 1. Pain due to onychomycosis of toenails of both feet   2. Callus   3. Type II diabetes mellitus with peripheral circulatory disorder [facility])     -Patient was evaluated and treated. All patient's and/or POA's questions/concerns answered on today's visit. -Continue foot and shoe inspections daily. Monitor blood glucose per PCP/Endocrinologist's recommendations. -Continue supportive shoe gear daily. -Toenails 1-5 b/l were debrided in length and girth with sterile nail nippers and dremel without iatrogenic bleeding.  -Callus(es) submet head 5 b/l pared utilizing sterile scalpel blade without complication or incident. Total number debrided =2. -Patient/POA to call should there be question/concern in the interim.   Return in about 3 months (around 09/10/2022).  Ashley Riddle, DPM

## 2022-06-13 ENCOUNTER — Other Ambulatory Visit: Payer: Self-pay | Admitting: Internal Medicine

## 2022-06-13 ENCOUNTER — Ambulatory Visit
Admission: RE | Admit: 2022-06-13 | Discharge: 2022-06-13 | Disposition: A | Discharge status: Home / Self Care | Payer: Medicare Other | Source: Ambulatory Visit | Attending: Internal Medicine | Admitting: Internal Medicine

## 2022-06-13 DIAGNOSIS — R059 Cough, unspecified: Secondary | ICD-10-CM | POA: Diagnosis not present

## 2022-06-13 DIAGNOSIS — R0602 Shortness of breath: Secondary | ICD-10-CM | POA: Diagnosis not present

## 2022-06-13 DIAGNOSIS — J188 Other pneumonia, unspecified organism: Secondary | ICD-10-CM

## 2022-07-02 DIAGNOSIS — Z7689 Persons encountering health services in other specified circumstances: Secondary | ICD-10-CM | POA: Diagnosis not present

## 2022-07-02 DIAGNOSIS — I10 Essential (primary) hypertension: Secondary | ICD-10-CM | POA: Diagnosis not present

## 2022-07-02 DIAGNOSIS — D6489 Other specified anemias: Secondary | ICD-10-CM | POA: Diagnosis not present

## 2022-07-09 DIAGNOSIS — I10 Essential (primary) hypertension: Secondary | ICD-10-CM | POA: Diagnosis not present

## 2022-07-09 DIAGNOSIS — R051 Acute cough: Secondary | ICD-10-CM | POA: Diagnosis not present

## 2022-07-09 DIAGNOSIS — J441 Chronic obstructive pulmonary disease with (acute) exacerbation: Secondary | ICD-10-CM | POA: Diagnosis not present

## 2022-07-22 DIAGNOSIS — M1711 Unilateral primary osteoarthritis, right knee: Secondary | ICD-10-CM | POA: Diagnosis not present

## 2022-08-07 DIAGNOSIS — M25562 Pain in left knee: Secondary | ICD-10-CM | POA: Diagnosis not present

## 2022-08-07 DIAGNOSIS — Z79899 Other long term (current) drug therapy: Secondary | ICD-10-CM | POA: Diagnosis not present

## 2022-08-07 DIAGNOSIS — G894 Chronic pain syndrome: Secondary | ICD-10-CM | POA: Diagnosis not present

## 2022-08-07 DIAGNOSIS — M25561 Pain in right knee: Secondary | ICD-10-CM | POA: Diagnosis not present

## 2022-08-13 DIAGNOSIS — E1165 Type 2 diabetes mellitus with hyperglycemia: Secondary | ICD-10-CM | POA: Diagnosis not present

## 2022-08-13 DIAGNOSIS — J440 Chronic obstructive pulmonary disease with acute lower respiratory infection: Secondary | ICD-10-CM | POA: Diagnosis not present

## 2022-08-15 DIAGNOSIS — G5601 Carpal tunnel syndrome, right upper limb: Secondary | ICD-10-CM | POA: Diagnosis not present

## 2022-09-18 ENCOUNTER — Encounter: Payer: Self-pay | Admitting: Podiatry

## 2022-09-18 ENCOUNTER — Ambulatory Visit (INDEPENDENT_AMBULATORY_CARE_PROVIDER_SITE_OTHER): Payer: 59 | Admitting: Podiatry

## 2022-09-18 DIAGNOSIS — M79674 Pain in right toe(s): Secondary | ICD-10-CM | POA: Diagnosis not present

## 2022-09-18 DIAGNOSIS — E119 Type 2 diabetes mellitus without complications: Secondary | ICD-10-CM | POA: Diagnosis not present

## 2022-09-18 DIAGNOSIS — L84 Corns and callosities: Secondary | ICD-10-CM

## 2022-09-18 DIAGNOSIS — M2012 Hallux valgus (acquired), left foot: Secondary | ICD-10-CM | POA: Diagnosis not present

## 2022-09-18 DIAGNOSIS — B351 Tinea unguium: Secondary | ICD-10-CM | POA: Diagnosis not present

## 2022-09-18 DIAGNOSIS — M79675 Pain in left toe(s): Secondary | ICD-10-CM | POA: Diagnosis not present

## 2022-09-18 DIAGNOSIS — M2011 Hallux valgus (acquired), right foot: Secondary | ICD-10-CM | POA: Diagnosis not present

## 2022-09-18 DIAGNOSIS — E1151 Type 2 diabetes mellitus with diabetic peripheral angiopathy without gangrene: Secondary | ICD-10-CM

## 2022-09-18 NOTE — Progress Notes (Signed)
ANNUAL DIABETIC FOOT EXAM  Subjective: Ashley Riddle presents today for annual diabetic foot examination.  Chief Complaint  Patient presents with   Nail Problem    [facility] BS-123 A1C-6.5 PCP-Bakare PCP VST- Couple months ago  Patient confirms h/o diabetes.  Patient denies any h/o foot wounds.  Patient has been diagnosed with neuropathy.  Risk factors: diabetes, neuropathy, PAD, HTN, h/o tobacco use in remission.  Ashley Forest, MD is patient's PCP.  Past Medical History:  Diagnosis Date   Anxiety    Arthritis    CAP (community acquired pneumonia) 02/18/2017   Constipation    COPD (chronic obstructive pulmonary disease)    DDD (degenerative disc disease), cervical    DDD (degenerative disc disease), lumbar    Gout    Heart murmur    probable bicuspid AV with mild AS, mild MR by 04/22/14 Echo (Dr. Shana Chute)   Hepatitis C    treated 2016    History of blood transfusion    Hypertension    Hypomagnesemia 03/02/2017   Pneumonia 12/02/2013   Sepsis 04/19/2020   Patient Active Problem List   Diagnosis Date Noted   Pneumonia 04/25/2022   Hyponatremia 04/25/2022   Carpal tunnel syndrome 11/17/2020   Arthritis 09/03/2020   Neuropathic pain 03/01/2020   S/P TKR (total knee replacement) using cement, left 12/14/2019   DM (diabetes mellitus) 11/15/2019   Medication management 03/23/2019   Chronic pain of both knees 01/26/2019   Trochanteric bursitis, right hip 12/15/2018   Lumbar radiculopathy 11/06/2018   Acute right-sided low back pain 10/06/2018   COPD with acute exacerbation 04/20/2018   Anxiety and depression 04/20/2018   Pain of left shoulder joint on movement 04/13/2017   Chronic bronchitis 03/29/2017   Gouty arthropathy 03/02/2017   Hyperammonemia 03/02/2017   Liver cirrhosis 02/18/2017   Leukocytosis 02/18/2017   Sinus tachycardia 02/18/2017   Tachypnea 02/18/2017   RLL pneumonia 02/18/2017   Increased ammonia level 02/18/2017   Asthma 11/25/2016    Idiopathic progressive polyneuropathy 11/25/2016   Peripheral vascular disease 11/25/2016   Obesity 11/25/2016   Prediabetes 11/02/2016   Retained metal fragment left scapula 12/26/2015   Fracture of glenoid process of left scapula 12/29/2014   Status post total shoulder arthroplasty 12/27/2014   Cocaine dependence in remission (HCC) 09/29/2014   Severe heroin dependence in sustained remission (HCC) 09/29/2014   Opiate abuse, episodic (HCC) 09/29/2014   Mild tetrahydrocannabinol (THC) abuse 09/29/2014   Chronic pain syndrome 04/18/2014   Primary osteoarthritis of left shoulder 04/18/2014   Primary osteoarthritis of right knee 04/18/2014   Primary osteoarthritis of left knee 04/18/2014   HYPERTENSION, BENIGN ESSENTIAL 03/22/2010   CONSTIPATION 03/22/2010   HEPATITIS C 06/04/1995   Past Surgical History:  Procedure Laterality Date   ABDOMINAL HYSTERECTOMY     APPENDECTOMY     BACK SURGERY     CESAREAN SECTION     x 3   COLONOSCOPY W/ POLYPECTOMY     HARDWARE REMOVAL Left 12/26/2015   Procedure: REMOVAL K-WIRE LEFT SCAPULA;  Surgeon: Valeria Batman, MD;  Location: [facility] OR;  Service: Orthopedics;  Laterality: Left;   INNER EAR SURGERY     blood vessel   TONSILLECTOMY     TOTAL SHOULDER ARTHROPLASTY Left 12/27/2014   Procedure: TOTAL SHOULDER ARTHROPLASTY;  Surgeon: Valeria Batman, MD;  Location: [facility] OR;  Service: Orthopedics;  Laterality: Left;   Current Outpatient Medications on File Prior to Visit  Medication Sig Dispense Refill   ACCU-CHEK GUIDE test  strip      acetaminophen (TYLENOL) 500 MG tablet Take 1,000 mg by mouth every 8 (eight) hours as needed for moderate pain.      aspirin 81 MG EC tablet Take 81 mg by mouth daily.     Buprenorphine HCl 750 MCG FILM Take 1 Film by mouth in the morning and at bedtime.     Calcium Carbonate-Vit D-Min (CALCIUM 600+D PLUS MINERALS) 600-400 MG-UNIT TABS Calcium 600 + D(3) 600 mg-10 mcg (400 unit) tablet  Take 1 tablet twice a day by  oral route for 30 days.     colchicine 0.6 MG tablet Take 0.6 mg by mouth daily as needed (gout flares).      Cyanocobalamin (B-12) 5000 MCG SUBL B12  take one tab per day     DULoxetine (CYMBALTA) 30 MG capsule Take 30 mg by mouth 2 (two) times daily.     losartan (COZAAR) 100 MG tablet Take 1 tablet by mouth daily.     Multiple Vitamin (MULTIVITAMIN WITH MINERALS) TABS tablet Take 1 tablet by mouth daily.     naloxone (NARCAN) nasal spray 4 mg/0.1 mL      oxyCODONE-acetaminophen (PERCOCET) 10-325 MG tablet Take 1 tablet by mouth every 4 (four) hours as needed for pain.     rosuvastatin (CRESTOR) 5 MG tablet Take 5 mg by mouth daily.     sertraline (ZOLOFT) 50 MG tablet Take 50 mg by mouth daily.     sitaGLIPtin (JANUVIA) 50 MG tablet Take 50 mg by mouth every morning.     tiZANidine (ZANAFLEX) 4 MG tablet Take 4 mg by mouth at bedtime.     No current facility-administered medications on file prior to visit.    Allergies  Allergen Reactions   Gabapentin Other (See Comments)   Pregabalin     Other reaction(s): Confusion   Aspirin Other (See Comments)    Due to history of hepatitis   Penicillins Hives    Has patient had a PCN reaction causing immediate rash, facial/tongue/throat swelling, SOB or lightheadedness with hypotension: Yes Has patient had a PCN reaction causing severe rash involving mucus membranes or skin necrosis: No Has patient had a PCN reaction that required hospitalization No Has patient had a PCN reaction occurring within the last 10 years: No If all of the above answers are "NO", then may proceed with Cephalosporin use.    Social History   Occupational History   Occupation: retired  Tobacco Use   Smoking status: Former    Packs/day: 1.00    Years: 49.00    Additional pack years: 0.00    Total pack years: 49.00    Types: Cigarettes    Quit date: 2007    Years since quitting: 17.3   Smokeless tobacco: Never   Tobacco comments:    quit 2007  Vaping Use    Vaping Use: Never used  Substance and Sexual Activity   Alcohol use: Not Currently   Drug use: No    Types: Marijuana    Comment: quit marijuana 12/18. last used heroin and cocaine in 1992.  Smokes Marijuana once a week., 12/22/15- "2 weeks ago"   Sexual activity: Not Currently   Family History  Problem Relation Age of Onset   Heart disease Mother    Kidney disease Mother    Alcohol abuse Mother    Breast cancer Daughter    Breast cancer Cousin 60   Immunization History  Administered Date(s) Administered   Influenza Inj Mdck Quad With  Preservative 05/23/2020   Influenza Whole 03/22/2010   Influenza,inj,quad, With Preservative 04/11/2017   PFIZER(Purple Top)SARS-COV-2 Vaccination 08/07/2019, 08/28/2019   Pneumococcal Conjugate-13 10/09/2018   Zoster Recombinat (Shingrix) 02/18/2020     Review of Systems: Negative except as noted in the HPI.   Objective: There were no vitals filed for this visit.  Ashley Riddle is a pleasant 77 y.o. female in NAD. AAO X 3.  Vascular Examination: CFT <4 seconds b/l LE. Faintly palpable DP pulses b/l LE. Faintly palpable PT pulse(s) b/l LE. Pedal hair absent. No pain with calf compression b/l. Lower extremity skin temperature gradient within normal limits. No edema noted b/l LE. No varicosities noted. No ischemia or gangrene noted b/l LE. No cyanosis or clubbing noted b/l LE.  Dermatological Examination: Pedal skin is warm and supple b/l LE. No open wounds b/l LE. No interdigital macerations noted b/l LE. Toenails 1-5 b/l elongated, discolored, dystrophic, thickened, crumbly with subungual debris and tenderness to dorsal palpation. Hyperkeratotic lesion(s) submet head 5 b/l.  No erythema, no edema, no drainage, no fluctuance.  Neurological Examination: Pt has subjective symptoms of neuropathy. Protective sensation intact 5/5 intact bilaterally with 10g monofilament b/l. Vibratory sensation intact b/l.  Musculoskeletal Examination: Muscle  strength 5/5 to all lower extremity muscle groups bilaterally. HAV with bunion deformity noted b/l LE.  Footwear Assessment: Does the patient wear appropriate shoes? Yes. Does the patient need inserts/orthotics? No.  ADA Risk Categorization: Low Risk :  Patient has all of the following: Intact protective sensation No prior foot ulcer  No severe deformity Pedal pulses present  Assessment: 1. Pain due to onychomycosis of toenails of both feet   2. Callus   3. Hallux valgus, acquired, bilateral   4. Type II diabetes mellitus with peripheral circulatory disorder   5. Encounter for diabetic foot exam     Plan: -Patient was evaluated and treated. All patient's and/or POA's questions/concerns answered on today's visit. -Diabetic foot examination performed today. -Continue diabetic foot care principles: inspect feet daily, monitor glucose as recommended by PCP and/or Endocrinologist, and follow prescribed diet per PCP, Endocrinologist and/or dietician. -Patient to continue soft, supportive shoe gear daily. -Toenails 1-5 b/l were debrided in length and girth with sterile nail nippers and dremel without iatrogenic bleeding.  -Callus(es) submet head 5 left foot and submet head 5 right foot pared utilizing sterile scalpel blade without complication or incident. Total number debrided =2. -Patient/POA to call should there be question/concern in the interim. Return in about 3 months (around 12/18/2022).  Freddie Breech, DPM

## 2022-10-09 ENCOUNTER — Other Ambulatory Visit: Payer: Self-pay | Admitting: Nurse Practitioner

## 2022-10-09 DIAGNOSIS — K7030 Alcoholic cirrhosis of liver without ascites: Secondary | ICD-10-CM

## 2022-11-05 ENCOUNTER — Ambulatory Visit
Admission: RE | Admit: 2022-11-05 | Discharge: 2022-11-05 | Disposition: A | Discharge status: Home / Self Care | Payer: 59 | Source: Ambulatory Visit | Attending: Internal Medicine | Admitting: Internal Medicine

## 2022-11-05 DIAGNOSIS — J449 Chronic obstructive pulmonary disease, unspecified: Secondary | ICD-10-CM | POA: Diagnosis not present

## 2022-11-05 DIAGNOSIS — E349 Endocrine disorder, unspecified: Secondary | ICD-10-CM | POA: Diagnosis not present

## 2022-11-05 DIAGNOSIS — Z13820 Encounter for screening for osteoporosis: Secondary | ICD-10-CM

## 2022-11-06 DIAGNOSIS — H50812 Duane's syndrome, left eye: Secondary | ICD-10-CM | POA: Diagnosis not present

## 2022-11-06 DIAGNOSIS — H35033 Hypertensive retinopathy, bilateral: Secondary | ICD-10-CM | POA: Diagnosis not present

## 2022-11-06 DIAGNOSIS — E119 Type 2 diabetes mellitus without complications: Secondary | ICD-10-CM | POA: Diagnosis not present

## 2022-11-06 DIAGNOSIS — H35363 Drusen (degenerative) of macula, bilateral: Secondary | ICD-10-CM | POA: Diagnosis not present

## 2022-11-07 DIAGNOSIS — M25562 Pain in left knee: Secondary | ICD-10-CM | POA: Diagnosis not present

## 2022-11-07 DIAGNOSIS — G894 Chronic pain syndrome: Secondary | ICD-10-CM | POA: Diagnosis not present

## 2022-11-07 DIAGNOSIS — M25561 Pain in right knee: Secondary | ICD-10-CM | POA: Diagnosis not present

## 2022-11-12 ENCOUNTER — Ambulatory Visit
Admission: RE | Admit: 2022-11-12 | Discharge: 2022-11-12 | Disposition: A | Discharge status: Home / Self Care | Payer: 59 | Source: Ambulatory Visit | Attending: Nurse Practitioner | Admitting: Nurse Practitioner

## 2022-11-12 DIAGNOSIS — K7030 Alcoholic cirrhosis of liver without ascites: Secondary | ICD-10-CM

## 2022-11-12 DIAGNOSIS — K7460 Unspecified cirrhosis of liver: Secondary | ICD-10-CM | POA: Diagnosis not present

## 2022-11-22 ENCOUNTER — Encounter: Payer: Self-pay | Admitting: Podiatry

## 2022-11-22 ENCOUNTER — Ambulatory Visit (INDEPENDENT_AMBULATORY_CARE_PROVIDER_SITE_OTHER): Payer: 59 | Admitting: Podiatry

## 2022-11-22 DIAGNOSIS — M722 Plantar fascial fibromatosis: Secondary | ICD-10-CM

## 2022-11-22 DIAGNOSIS — M109 Gout, unspecified: Secondary | ICD-10-CM

## 2022-11-22 MED ORDER — TRIAMCINOLONE ACETONIDE 10 MG/ML IJ SUSP
10.0000 mg | Freq: Once | INTRAMUSCULAR | Status: AC
  Administered 2022-11-22: 10 mg

## 2022-11-23 NOTE — Progress Notes (Signed)
Subjective:   Patient ID: Ashley Riddle, female   DOB: 77 y.o.   MRN: [id number]   HPI Patient presents with a lot of pain around the first MPJ left foot fluid buildup and states that she has had history of gout   ROS      Objective:  Physical Exam  Neurovascular status intact inflammation of the left first MPJ fluid buildup around the joint surface painful when pressed     Assessment:  Probability for gout of the first MPJ left with fluid buildup     Plan:  Reviewed condition and gout gave her foods to be careful with sterile prep injected around the first MPJ 3 mg dexamethasone Kenalog 5 mg Xylocaine advised on reduced activity reappoint as symptoms indicate  X-rays are negative for signs of bony arthritis or other pathology of the joint

## 2022-12-13 DIAGNOSIS — E7849 Other hyperlipidemia: Secondary | ICD-10-CM | POA: Diagnosis not present

## 2022-12-13 DIAGNOSIS — E1165 Type 2 diabetes mellitus with hyperglycemia: Secondary | ICD-10-CM | POA: Diagnosis not present

## 2022-12-13 DIAGNOSIS — Z0001 Encounter for general adult medical examination with abnormal findings: Secondary | ICD-10-CM | POA: Diagnosis not present

## 2022-12-13 DIAGNOSIS — D6489 Other specified anemias: Secondary | ICD-10-CM | POA: Diagnosis not present

## 2022-12-17 DIAGNOSIS — Z780 Asymptomatic menopausal state: Secondary | ICD-10-CM | POA: Diagnosis not present

## 2022-12-17 DIAGNOSIS — Z1211 Encounter for screening for malignant neoplasm of colon: Secondary | ICD-10-CM | POA: Diagnosis not present

## 2022-12-17 DIAGNOSIS — Z13820 Encounter for screening for osteoporosis: Secondary | ICD-10-CM | POA: Diagnosis not present

## 2022-12-17 DIAGNOSIS — Z1231 Encounter for screening mammogram for malignant neoplasm of breast: Secondary | ICD-10-CM | POA: Diagnosis not present

## 2022-12-17 DIAGNOSIS — J441 Chronic obstructive pulmonary disease with (acute) exacerbation: Secondary | ICD-10-CM | POA: Diagnosis not present

## 2022-12-17 DIAGNOSIS — B372 Candidiasis of skin and nail: Secondary | ICD-10-CM | POA: Diagnosis not present

## 2022-12-17 DIAGNOSIS — Z0001 Encounter for general adult medical examination with abnormal findings: Secondary | ICD-10-CM | POA: Diagnosis not present

## 2022-12-17 DIAGNOSIS — Z23 Encounter for immunization: Secondary | ICD-10-CM | POA: Diagnosis not present

## 2022-12-17 DIAGNOSIS — M8580 Other specified disorders of bone density and structure, unspecified site: Secondary | ICD-10-CM | POA: Diagnosis not present

## 2022-12-17 DIAGNOSIS — M79672 Pain in left foot: Secondary | ICD-10-CM | POA: Diagnosis not present

## 2022-12-17 DIAGNOSIS — Z122 Encounter for screening for malignant neoplasm of respiratory organs: Secondary | ICD-10-CM | POA: Diagnosis not present

## 2023-01-10 DIAGNOSIS — I739 Peripheral vascular disease, unspecified: Secondary | ICD-10-CM | POA: Diagnosis not present

## 2023-01-10 DIAGNOSIS — J410 Simple chronic bronchitis: Secondary | ICD-10-CM | POA: Diagnosis not present

## 2023-01-10 DIAGNOSIS — E7849 Other hyperlipidemia: Secondary | ICD-10-CM | POA: Diagnosis not present

## 2023-01-10 DIAGNOSIS — Z23 Encounter for immunization: Secondary | ICD-10-CM | POA: Diagnosis not present

## 2023-01-10 DIAGNOSIS — I10 Essential (primary) hypertension: Secondary | ICD-10-CM | POA: Diagnosis not present

## 2023-01-10 DIAGNOSIS — E1142 Type 2 diabetes mellitus with diabetic polyneuropathy: Secondary | ICD-10-CM | POA: Diagnosis not present

## 2023-01-13 ENCOUNTER — Encounter: Payer: Self-pay | Admitting: Podiatry

## 2023-01-13 ENCOUNTER — Ambulatory Visit (INDEPENDENT_AMBULATORY_CARE_PROVIDER_SITE_OTHER): Payer: 59 | Admitting: Podiatry

## 2023-01-13 DIAGNOSIS — M722 Plantar fascial fibromatosis: Secondary | ICD-10-CM | POA: Diagnosis not present

## 2023-01-13 MED ORDER — TRIAMCINOLONE ACETONIDE 10 MG/ML IJ SUSP
10.0000 mg | Freq: Once | INTRAMUSCULAR | Status: AC
  Administered 2023-01-13: 10 mg via INTRA_ARTICULAR

## 2023-01-13 NOTE — Progress Notes (Signed)
Subjective:   Patient ID: Ashley Riddle, female   DOB: 77 y.o.   MRN: [id number]   HPI Patient presents stating that the heel has been hurting again and making it hard to walk   ROS      Objective:  Physical Exam  Neurovascular status intact inflammation pain of the plantar heel left with fluid buildup     Assessment:  Reoccurrence acute Planter fasciitis left     Plan:  Sterile prep injected the plantar fascia left 3 mg Kenalog 5 mg Xylocaine advised on stretching and good support shoes reappoint to recheck

## 2023-02-17 DIAGNOSIS — M25562 Pain in left knee: Secondary | ICD-10-CM | POA: Diagnosis not present

## 2023-02-17 DIAGNOSIS — G894 Chronic pain syndrome: Secondary | ICD-10-CM | POA: Diagnosis not present

## 2023-02-17 DIAGNOSIS — M25561 Pain in right knee: Secondary | ICD-10-CM | POA: Diagnosis not present

## 2023-02-17 DIAGNOSIS — M792 Neuralgia and neuritis, unspecified: Secondary | ICD-10-CM | POA: Diagnosis not present

## 2023-02-17 DIAGNOSIS — Z79899 Other long term (current) drug therapy: Secondary | ICD-10-CM | POA: Diagnosis not present

## 2023-02-18 ENCOUNTER — Encounter: Payer: Self-pay | Admitting: Podiatry

## 2023-02-18 ENCOUNTER — Ambulatory Visit (INDEPENDENT_AMBULATORY_CARE_PROVIDER_SITE_OTHER): Payer: 59 | Admitting: Podiatry

## 2023-02-18 DIAGNOSIS — M2012 Hallux valgus (acquired), left foot: Secondary | ICD-10-CM

## 2023-02-18 DIAGNOSIS — B351 Tinea unguium: Secondary | ICD-10-CM | POA: Diagnosis not present

## 2023-02-18 DIAGNOSIS — E1151 Type 2 diabetes mellitus with diabetic peripheral angiopathy without gangrene: Secondary | ICD-10-CM | POA: Diagnosis not present

## 2023-02-18 DIAGNOSIS — M79674 Pain in right toe(s): Secondary | ICD-10-CM | POA: Diagnosis not present

## 2023-02-18 DIAGNOSIS — M2011 Hallux valgus (acquired), right foot: Secondary | ICD-10-CM

## 2023-02-18 DIAGNOSIS — L84 Corns and callosities: Secondary | ICD-10-CM

## 2023-02-18 DIAGNOSIS — M79675 Pain in left toe(s): Secondary | ICD-10-CM | POA: Diagnosis not present

## 2023-02-23 NOTE — Progress Notes (Signed)
Subjective:  Patient ID: Ashley Riddle, female    DOB: [birth year],  MRN: [id number]  Agathe Mccluney presents to clinic today for at risk foot care. Pt has h/o NIDDM with PAD and callus(es) of both feet and painful thick toenails that are difficult to trim. Painful toenails interfere with ambulation. Aggravating factors include wearing enclosed shoe gear. Pain is relieved with periodic professional debridement. Painful calluses are aggravated when weightbearing with and without shoegear. Pain is relieved with periodic professional debridement.  Chief Complaint  Patient presents with   Diabetes    DFC BS - 123 A1C - 6.7   New problem(s): None.   PCP is Bakare, Mobolaji B, MD.  Allergies  Allergen Reactions   Gabapentin Other (See Comments)   Pregabalin     Other reaction(s): Confusion   Aspirin Other (See Comments)    Due to history of hepatitis   Penicillins Hives    Has patient had a PCN reaction causing immediate rash, facial/tongue/throat swelling, SOB or lightheadedness with hypotension: Yes Has patient had a PCN reaction causing severe rash involving mucus membranes or skin necrosis: No Has patient had a PCN reaction that required hospitalization No Has patient had a PCN reaction occurring within the last 10 years: No If all of the above answers are "NO", then may proceed with Cephalosporin use.     Review of Systems: Negative except as noted in the HPI.  Objective: No changes noted in today's physical examination. There were no vitals filed for this visit. Landy Kangas is a pleasant 77 y.o. female WD, WN in NAD. AAO x 3.  Vascular Examination: CFT <4 seconds b/l LE. Faintly palpable DP pulses b/l LE. Faintly palpable PT pulse(s) b/l LE. Pedal hair absent. No pain with calf compression b/l. Lower extremity skin temperature gradient within normal limits. No edema noted b/l LE. No varicosities noted. No ischemia or gangrene noted b/l LE. No cyanosis or  clubbing noted b/l LE.  Dermatological Examination: Pedal skin is warm and supple b/l LE. No open wounds b/l LE. No interdigital macerations noted b/l LE. Toenails 1-5 b/l elongated, discolored, dystrophic, thickened, crumbly with subungual debris and tenderness to dorsal palpation. Hyperkeratotic lesion(s) submet head 5 b/l.  No erythema, no edema, no drainage, no fluctuance.  Neurological Examination: Pt has subjective symptoms of neuropathy. Protective sensation intact 5/5 intact bilaterally with 10g monofilament b/l. Vibratory sensation intact b/l.  Musculoskeletal Examination: Muscle strength 5/5 to all lower extremity muscle groups bilaterally. HAV with bunion deformity noted b/l LE.  Assessment/Plan: Orders Placed This Encounter  Procedures   For Home Use Only DME Diabetic Shoe    Dispense one pair extra depth shoes with 3 pair total contact insoles. Offload calluses submet head 5 bilaterally.   1. Pain due to onychomycosis of toenails of both feet   2. Callus   3. Type II diabetes mellitus with peripheral circulatory disorder (HCC)    -Consent given for treatment as described below: -Examined patient. -Continue supportive shoe gear daily. -Patient to schedule appointment with Pedorthist for diabetic shoe measurements. -Discussed diabetic shoe benefit available based on patient's diagnoses. Patient/POA would like to proceed. Order entered for one pair extra depth shoes and 3 pair total contact insoles. Patient qualifies based on diagnoses. -Mycotic toenails 1-5 bilaterally were debrided in length and girth with sterile nail nippers and dremel without incident. -Callus(es) submet head 5 b/l pared utilizing sterile scalpel blade without complication or incident. Total number debrided =2. -Patient/POA to call should there  be question/concern in the interim.   Return in about 4 months (around 06/20/2023).  Freddie Breech, DPM

## 2023-03-04 ENCOUNTER — Other Ambulatory Visit: Payer: Self-pay | Admitting: Internal Medicine

## 2023-03-04 DIAGNOSIS — Z1231 Encounter for screening mammogram for malignant neoplasm of breast: Secondary | ICD-10-CM

## 2023-03-05 ENCOUNTER — Ambulatory Visit: Payer: 59

## 2023-03-05 DIAGNOSIS — L84 Corns and callosities: Secondary | ICD-10-CM

## 2023-03-05 DIAGNOSIS — E1151 Type 2 diabetes mellitus with diabetic peripheral angiopathy without gangrene: Secondary | ICD-10-CM

## 2023-03-05 DIAGNOSIS — M2012 Hallux valgus (acquired), left foot: Secondary | ICD-10-CM

## 2023-03-05 DIAGNOSIS — M2011 Hallux valgus (acquired), right foot: Secondary | ICD-10-CM

## 2023-03-05 NOTE — Progress Notes (Signed)
Patient presents to the office today for diabetic shoe and insole measuring.  Patient was measured with brannock device to determine size and width for 1 pair of extra depth shoes and foam casted for 3 pair of insoles.   Documentation of medical necessity will be sent to patient's treating diabetic doctor to verify and sign.   Patient's diabetic provider: [facility]   Shoes and insoles will be ordered at that time and patient will be notified for an appointment for fitting when they arrive.   Shoe size (per patient): 10 Brannock measurement: 9.5 W  Patient shoe selection-  Shoe choice:   [id number] / [id number] Womens Shoe size ordered: 9.5 W Mens   ABN and financial signed

## 2023-04-01 DIAGNOSIS — M79641 Pain in right hand: Secondary | ICD-10-CM | POA: Diagnosis not present

## 2023-04-04 DIAGNOSIS — K7460 Unspecified cirrhosis of liver: Secondary | ICD-10-CM | POA: Diagnosis not present

## 2023-04-04 DIAGNOSIS — R053 Chronic cough: Secondary | ICD-10-CM | POA: Diagnosis not present

## 2023-04-04 DIAGNOSIS — E559 Vitamin D deficiency, unspecified: Secondary | ICD-10-CM | POA: Diagnosis not present

## 2023-04-04 DIAGNOSIS — E7849 Other hyperlipidemia: Secondary | ICD-10-CM | POA: Diagnosis not present

## 2023-04-04 DIAGNOSIS — D6489 Other specified anemias: Secondary | ICD-10-CM | POA: Diagnosis not present

## 2023-04-04 DIAGNOSIS — I10 Essential (primary) hypertension: Secondary | ICD-10-CM | POA: Diagnosis not present

## 2023-04-04 DIAGNOSIS — E1121 Type 2 diabetes mellitus with diabetic nephropathy: Secondary | ICD-10-CM | POA: Diagnosis not present

## 2023-04-04 DIAGNOSIS — J410 Simple chronic bronchitis: Secondary | ICD-10-CM | POA: Diagnosis not present

## 2023-04-04 DIAGNOSIS — E1165 Type 2 diabetes mellitus with hyperglycemia: Secondary | ICD-10-CM | POA: Diagnosis not present

## 2023-04-09 ENCOUNTER — Ambulatory Visit (INDEPENDENT_AMBULATORY_CARE_PROVIDER_SITE_OTHER): Payer: 59

## 2023-04-09 ENCOUNTER — Ambulatory Visit
Admission: RE | Admit: 2023-04-09 | Discharge: 2023-04-09 | Disposition: A | Discharge status: Home / Self Care | Payer: 59 | Source: Ambulatory Visit | Attending: Internal Medicine | Admitting: Internal Medicine

## 2023-04-09 DIAGNOSIS — Z1231 Encounter for screening mammogram for malignant neoplasm of breast: Secondary | ICD-10-CM | POA: Diagnosis not present

## 2023-04-09 DIAGNOSIS — E1151 Type 2 diabetes mellitus with diabetic peripheral angiopathy without gangrene: Secondary | ICD-10-CM

## 2023-04-09 DIAGNOSIS — M722 Plantar fascial fibromatosis: Secondary | ICD-10-CM

## 2023-04-09 DIAGNOSIS — M2012 Hallux valgus (acquired), left foot: Secondary | ICD-10-CM

## 2023-04-09 DIAGNOSIS — L84 Corns and callosities: Secondary | ICD-10-CM

## 2023-04-09 DIAGNOSIS — B351 Tinea unguium: Secondary | ICD-10-CM

## 2023-04-09 NOTE — Progress Notes (Signed)

## 2023-05-07 ENCOUNTER — Emergency Department (HOSPITAL_COMMUNITY)
Admission: EM | Admit: 2023-05-07 | Discharge: 2023-05-07 | Discharge status: Against Medical Advice | Payer: 59 | Attending: Emergency Medicine | Admitting: Emergency Medicine

## 2023-05-07 ENCOUNTER — Encounter (HOSPITAL_COMMUNITY): Payer: Self-pay

## 2023-05-07 ENCOUNTER — Emergency Department (HOSPITAL_COMMUNITY): Payer: 59

## 2023-05-07 ENCOUNTER — Other Ambulatory Visit: Payer: Self-pay

## 2023-05-07 DIAGNOSIS — M5441 Lumbago with sciatica, right side: Secondary | ICD-10-CM | POA: Insufficient documentation

## 2023-05-07 DIAGNOSIS — M79604 Pain in right leg: Secondary | ICD-10-CM | POA: Diagnosis not present

## 2023-05-07 DIAGNOSIS — Z5321 Procedure and treatment not carried out due to patient leaving prior to being seen by health care provider: Secondary | ICD-10-CM | POA: Insufficient documentation

## 2023-05-07 DIAGNOSIS — M4316 Spondylolisthesis, lumbar region: Secondary | ICD-10-CM | POA: Diagnosis not present

## 2023-05-07 DIAGNOSIS — M84351A Stress fracture, right femur, initial encounter for fracture: Secondary | ICD-10-CM | POA: Diagnosis not present

## 2023-05-07 DIAGNOSIS — Z041 Encounter for examination and observation following transport accident: Secondary | ICD-10-CM | POA: Diagnosis not present

## 2023-05-07 DIAGNOSIS — M47816 Spondylosis without myelopathy or radiculopathy, lumbar region: Secondary | ICD-10-CM | POA: Diagnosis not present

## 2023-05-07 DIAGNOSIS — Y92410 Unspecified street and highway as the place of occurrence of the external cause: Secondary | ICD-10-CM | POA: Insufficient documentation

## 2023-05-07 DIAGNOSIS — M1711 Unilateral primary osteoarthritis, right knee: Secondary | ICD-10-CM | POA: Diagnosis not present

## 2023-05-07 MED ORDER — OXYCODONE-ACETAMINOPHEN 5-325 MG PO TABS
2.0000 | ORAL_TABLET | ORAL | Status: DC | PRN
  Administered 2023-05-07: 2 via ORAL
  Filled 2023-05-07: qty 2

## 2023-05-07 NOTE — ED Notes (Signed)
Pt left AMA °
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
## 2023-05-08 ENCOUNTER — Other Ambulatory Visit: Payer: Self-pay

## 2023-05-08 ENCOUNTER — Encounter (HOSPITAL_COMMUNITY): Payer: Self-pay | Admitting: Emergency Medicine

## 2023-05-08 ENCOUNTER — Ambulatory Visit (HOSPITAL_COMMUNITY)
Admission: EM | Admit: 2023-05-08 | Discharge: 2023-05-08 | Disposition: A | Discharge status: Home / Self Care | Payer: 59

## 2023-05-08 DIAGNOSIS — M5441 Lumbago with sciatica, right side: Secondary | ICD-10-CM | POA: Diagnosis not present

## 2023-05-08 NOTE — Discharge Instructions (Signed)
Please follow-up with your orthopedist and use a walker.  May apply ice to areas.  Take your prescribed pain medicine.
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
## 2023-05-13 DIAGNOSIS — M25561 Pain in right knee: Secondary | ICD-10-CM | POA: Diagnosis not present

## 2023-05-13 DIAGNOSIS — M542 Cervicalgia: Secondary | ICD-10-CM | POA: Diagnosis not present

## 2023-05-13 DIAGNOSIS — M25531 Pain in right wrist: Secondary | ICD-10-CM | POA: Diagnosis not present

## 2023-05-14 ENCOUNTER — Other Ambulatory Visit: Payer: Self-pay | Admitting: Internal Medicine

## 2023-05-14 ENCOUNTER — Ambulatory Visit
Admission: RE | Admit: 2023-05-14 | Discharge: 2023-05-14 | Disposition: A | Discharge status: Home / Self Care | Payer: 59 | Source: Ambulatory Visit | Attending: Internal Medicine | Admitting: Internal Medicine

## 2023-05-14 DIAGNOSIS — M25561 Pain in right knee: Secondary | ICD-10-CM

## 2023-05-14 DIAGNOSIS — Z79899 Other long term (current) drug therapy: Secondary | ICD-10-CM | POA: Diagnosis not present

## 2023-05-14 DIAGNOSIS — G894 Chronic pain syndrome: Secondary | ICD-10-CM | POA: Diagnosis not present

## 2023-05-14 DIAGNOSIS — M542 Cervicalgia: Secondary | ICD-10-CM

## 2023-05-14 DIAGNOSIS — M25531 Pain in right wrist: Secondary | ICD-10-CM | POA: Diagnosis not present

## 2023-05-14 DIAGNOSIS — M11261 Other chondrocalcinosis, right knee: Secondary | ICD-10-CM | POA: Diagnosis not present

## 2023-05-14 DIAGNOSIS — M25562 Pain in left knee: Secondary | ICD-10-CM | POA: Diagnosis not present

## 2023-05-14 DIAGNOSIS — M25461 Effusion, right knee: Secondary | ICD-10-CM | POA: Diagnosis not present

## 2023-05-14 DIAGNOSIS — M1711 Unilateral primary osteoarthritis, right knee: Secondary | ICD-10-CM | POA: Diagnosis not present

## 2023-05-14 DIAGNOSIS — M792 Neuralgia and neuritis, unspecified: Secondary | ICD-10-CM | POA: Diagnosis not present

## 2023-06-13 ENCOUNTER — Ambulatory Visit (INDEPENDENT_AMBULATORY_CARE_PROVIDER_SITE_OTHER): Payer: 59 | Admitting: Orthopaedic Surgery

## 2023-06-13 VITALS — BP 136/76 | HR 73 | Ht 61.0 in | Wt 195.0 lb

## 2023-06-13 DIAGNOSIS — M1711 Unilateral primary osteoarthritis, right knee: Secondary | ICD-10-CM | POA: Diagnosis not present

## 2023-06-13 MED ORDER — BUPIVACAINE HCL 0.25 % IJ SOLN
4.0000 mL | INTRAMUSCULAR | Status: AC | PRN
  Administered 2023-06-13: 4 mL via INTRA_ARTICULAR

## 2023-06-13 MED ORDER — LIDOCAINE HCL 1 % IJ SOLN
0.5000 mL | INTRAMUSCULAR | Status: AC | PRN
  Administered 2023-06-13: .5 mL

## 2023-06-13 MED ORDER — METHYLPREDNISOLONE ACETATE 40 MG/ML IJ SUSP
40.0000 mg | INTRAMUSCULAR | Status: AC | PRN
  Administered 2023-06-13: 40 mg via INTRA_ARTICULAR

## 2023-06-13 NOTE — Progress Notes (Signed)
 Office Visit Note   Patient: Ashley Riddle           Date of Birth: [birth year]           MRN: [id number] Visit Date: 06/13/2023              Requested by: Roanna Ezekiel NOVAK, MD [address] [place],  [place] [address] PCP: Roanna Ezekiel NOVAK, MD   Assessment & Plan: Visit Diagnoses: No diagnosis found.  Plan: Right knee injection performed follow-up as needed.  Follow-Up Instructions: No follow-ups on file.   Orders:  No orders of the defined types were placed in this encounter.  No orders of the defined types were placed in this encounter.     Procedures: Large Joint Inj: R knee on 06/13/2023 10:58 AM Indications: pain and joint swelling Details: 22 G 1.5 in needle, anterolateral approach  Arthrogram: No  Medications: 40 mg methylPREDNISolone  acetate 40 MG/ML; 0.5 mL lidocaine  1 %; 4 mL bupivacaine  0.25 % Outcome: tolerated well, no immediate complications Procedure, treatment alternatives, risks and benefits explained, specific risks discussed. Consent was given by the patient. Immediately prior to procedure a time out was called to verify the correct patient, procedure, equipment, support staff and site/side marked as required. Patient was prepped and draped in the usual sterile fashion.       Clinical Data: No additional findings.   Subjective: Chief Complaint  Patient presents with   Right Knee - Pain    HPI 78 year old female with right knee tricompartmental degenerative arthritis.  Previous left total knee arthroplasty done 2021.  Previous injection last year gave her some relief she is requesting repeat injection.  She is trying to defer right total knee arthroplasty as long as she can.  Review of Systems all systems update.  History of addiction problems in her younger years.  Positive history of gout.   Objective: Vital Signs: BP 136/76   Pulse 73   Ht 5' 1 (1.549 m)   Wt 195 lb (88.5 kg)   BMI 36.84 kg/m   Physical  Exam Constitutional:      Appearance: She is well-developed.  HENT:     Head: Normocephalic.     Right Ear: External ear normal.     Left Ear: External ear normal. There is no impacted cerumen.  Eyes:     Pupils: Pupils are equal, round, and reactive to light.  Neck:     Thyroid : No thyromegaly.     Trachea: No tracheal deviation.  Cardiovascular:     Rate and Rhythm: Normal rate.  Pulmonary:     Effort: Pulmonary effort is normal.  Abdominal:     Palpations: Abdomen is soft.  Musculoskeletal:     Cervical back: No rigidity.  Skin:    General: Skin is warm and dry.  Neurological:     Mental Status: She is alert and oriented to person, place, and time.  Psychiatric:        Behavior: Behavior normal.     Ortho Exam right knee crepitus.  Palpable osteophytes.  Specialty Comments:  No specialty comments available.  Imaging: No results found.   PMFS History: Patient Active Problem List   Diagnosis Date Noted   Pneumonia 04/25/2022   Hyponatremia 04/25/2022   Carpal tunnel syndrome 11/17/2020   Arthritis 09/03/2020   Neuropathic pain 03/01/2020   S/P TKR (total knee replacement) using cement, left 12/14/2019   DM (diabetes mellitus) (HCC) 11/15/2019   Medication management 03/23/2019  Chronic pain of both knees 01/26/2019   Trochanteric bursitis, right hip 12/15/2018   Lumbar radiculopathy 11/06/2018   Acute right-sided low back pain 10/06/2018   COPD with acute exacerbation (HCC) 04/20/2018   Anxiety and depression 04/20/2018   Pain of left shoulder joint on movement 04/13/2017   Chronic bronchitis (HCC) 03/29/2017   Gouty arthropathy 03/02/2017   Hyperammonemia (HCC) 03/02/2017   Liver cirrhosis (HCC) 02/18/2017   Leukocytosis 02/18/2017   Sinus tachycardia 02/18/2017   Tachypnea 02/18/2017   RLL pneumonia 02/18/2017   Increased ammonia level 02/18/2017   Asthma 11/25/2016   Idiopathic progressive polyneuropathy 11/25/2016   Peripheral vascular disease  (HCC) 11/25/2016   Obesity 11/25/2016   Prediabetes 11/02/2016   Retained metal fragment left scapula 12/26/2015   Fracture of glenoid process of left scapula 12/29/2014   Status post total shoulder arthroplasty 12/27/2014   Cocaine dependence in remission (HCC) 09/29/2014   Severe heroin dependence in sustained remission (HCC) 09/29/2014   Opiate abuse, episodic (HCC) 09/29/2014   Mild tetrahydrocannabinol (THC) abuse 09/29/2014   Chronic pain syndrome 04/18/2014   Primary osteoarthritis of left shoulder 04/18/2014   Primary osteoarthritis of right knee 04/18/2014   Primary osteoarthritis of left knee 04/18/2014   HYPERTENSION, BENIGN ESSENTIAL 03/22/2010   CONSTIPATION 03/22/2010   HEPATITIS C 06/04/1995   Past Medical History:  Diagnosis Date   Anxiety    Arthritis    CAP (community acquired pneumonia) 02/18/2017   Constipation    COPD (chronic obstructive pulmonary disease) (HCC)    DDD (degenerative disc disease), cervical    DDD (degenerative disc disease), lumbar    Gout    Heart murmur    probable bicuspid AV with mild AS, mild MR by 04/22/14 Echo (Dr. Andria)   Hepatitis C    treated 2016    History of blood transfusion    Hypertension    Hypomagnesemia 03/02/2017   Pneumonia 12/02/2013   Sepsis (HCC) 04/19/2020    Family History  Problem Relation Age of Onset   Heart disease Mother    Kidney disease Mother    Alcohol  abuse Mother    Breast cancer Daughter 54   Breast cancer Cousin 67   BRCA 1/2 Neg Hx     Past Surgical History:  Procedure Laterality Date   ABDOMINAL HYSTERECTOMY     APPENDECTOMY     BACK SURGERY     CESAREAN SECTION     x 3   COLONOSCOPY W/ POLYPECTOMY     HARDWARE REMOVAL Left 12/26/2015   Procedure: REMOVAL K-WIRE LEFT SCAPULA;  Surgeon: Maude LELON Right, MD;  Location: [facility] OR;  Service: Orthopedics;  Laterality: Left;   INNER EAR SURGERY     blood vessel   TONSILLECTOMY     TOTAL SHOULDER ARTHROPLASTY Left 12/27/2014    Procedure: TOTAL SHOULDER ARTHROPLASTY;  Surgeon: Maude LELON Right, MD;  Location: [facility] OR;  Service: Orthopedics;  Laterality: Left;   Social History   Occupational History   Occupation: retired  Tobacco Use   Smoking status: Former    Current packs/day: 0.00    Average packs/day: 1 pack/day for 49.0 years (49.0 ttl pk-yrs)    Types: Cigarettes    Start date: 5    Quit date: 2007    Years since quitting: 18.0   Smokeless tobacco: Never   Tobacco comments:    quit 2007  Vaping Use   Vaping status: Never Used  Substance and Sexual Activity   Alcohol  use: Not Currently  Drug use: No    Types: Marijuana    Comment: quit marijuana 12/18. last used heroin and cocaine in 1992.  Smokes Marijuana once a week., 12/22/15- 2 weeks ago   Sexual activity: Not Currently

## 2023-06-24 ENCOUNTER — Encounter: Payer: Self-pay | Admitting: Podiatry

## 2023-06-24 ENCOUNTER — Ambulatory Visit (INDEPENDENT_AMBULATORY_CARE_PROVIDER_SITE_OTHER): Payer: 59 | Admitting: Podiatry

## 2023-06-24 DIAGNOSIS — B351 Tinea unguium: Secondary | ICD-10-CM

## 2023-06-24 DIAGNOSIS — M79674 Pain in right toe(s): Secondary | ICD-10-CM

## 2023-06-24 DIAGNOSIS — L84 Corns and callosities: Secondary | ICD-10-CM

## 2023-06-24 DIAGNOSIS — E1151 Type 2 diabetes mellitus with diabetic peripheral angiopathy without gangrene: Secondary | ICD-10-CM | POA: Diagnosis not present

## 2023-06-24 DIAGNOSIS — M79675 Pain in left toe(s): Secondary | ICD-10-CM

## 2023-06-29 NOTE — Progress Notes (Signed)
Subjective:  Patient ID: Ashley Riddle, female    DOB: [birth year],  MRN: [id number]  78 y.o. female presents with at risk foot care. Pt has h/o NIDDM with PAD and callus(es) b/l feet and painful mycotic toenails that are difficult to trim. Painful toenails interfere with ambulation. Aggravating factors include wearing enclosed shoe gear. Pain is relieved with periodic professional debridement. Painful calluses are aggravated when weightbearing with and without shoegear. Pain is relieved with periodic professional debridement. Chief Complaint  Patient presents with   Diabetes    Patient last saw her PCP October , A1c is 6.7, Patient PCP name is Jamison Oka     PCP: Har[place], MD.  New problem(s): None.   Review of Systems: Negative except as noted in the HPI.   Allergies  Allergen Reactions   Gabapentin Other (See Comments)   Pregabalin     Other reaction(s): Confusion   Aspirin Other (See Comments)    Due to history of hepatitis   Penicillins Hives    Has patient had a PCN reaction causing immediate rash, facial/tongue/throat swelling, SOB or lightheadedness with hypotension: Yes Has patient had a PCN reaction causing severe rash involving mucus membranes or skin necrosis: No Has patient had a PCN reaction that required hospitalization No Has patient had a PCN reaction occurring within the last 10 years: No If all of the above answers are "NO", then may proceed with Cephalosporin use.     Objective:  There were no vitals filed for this visit. Constitutional Patient is a pleasant 78 y.o. female thin build in NAD. AAO x 3.  Vascular Capillary fill time to digits <3 seconds.  DP/PT pulse(s) are faintly palpable b/l lower extremities. Pedal hair absent b/l. Lower extremity skin temperature gradient warm to cool b/l. No pain with calf compression b/l. No cyanosis or clubbing noted. No ischemia nor gangrene noted b/l.   Neurologic Protective sensation intact 5/5  intact bilaterally with 10g monofilament b/l. Pt has subjective symptoms of neuropathy.  Dermatologic Pedal skin is thin, shiny and atrophic b/l.  No open wounds b/l lower extremities. No interdigital macerations b/l lower extremities. Toenails 1-5 b/l elongated, discolored, dystrophic, thickened, crumbly with subungual debris and tenderness to dorsal palpation. Hyperkeratotic lesion(s) submet head 5 b/l.  No erythema, no edema, no drainage, no fluctuance.  Orthopedic: Normal muscle strength 5/5 to all lower extremity muscle groups bilaterally. HAV with bunion deformity noted b/l LE.   Assessment:   1. Pain due to onychomycosis of toenails of both feet   2. Callus   3. Type II diabetes mellitus with peripheral circulatory disorder [facility])    Plan:  Patient was evaluated and treated and all questions answered. Consent given for treatment as described below: -Continue foot and shoe inspections daily. Monitor blood glucose per PCP/Endocrinologist's recommendations. -Toenails 1-5 b/l were debrided in length and girth with sterile nail nippers and dremel without iatrogenic bleeding.  -Callus(es) submet head 5 left foot and submet head 5 right foot pared utilizing sterile scalpel blade without complication or incident. Total number debrided =2. -Patient/POA to call should there be question/concern in the interim.  Return in about 3 months (around 09/22/2023).  Freddie Breech, DPM      [place] LOCATION: [address]  [place], [place] [address]                   Office [phone]   [facility] LOCATION: [address] [place], [place] [address] Office [phone]

## 2023-07-07 DIAGNOSIS — G894 Chronic pain syndrome: Secondary | ICD-10-CM | POA: Diagnosis not present

## 2023-07-07 DIAGNOSIS — I10 Essential (primary) hypertension: Secondary | ICD-10-CM | POA: Diagnosis not present

## 2023-07-07 DIAGNOSIS — E559 Vitamin D deficiency, unspecified: Secondary | ICD-10-CM | POA: Diagnosis not present

## 2023-07-07 DIAGNOSIS — E1142 Type 2 diabetes mellitus with diabetic polyneuropathy: Secondary | ICD-10-CM | POA: Diagnosis not present

## 2023-07-07 DIAGNOSIS — D6489 Other specified anemias: Secondary | ICD-10-CM | POA: Diagnosis not present

## 2023-07-07 DIAGNOSIS — E1165 Type 2 diabetes mellitus with hyperglycemia: Secondary | ICD-10-CM | POA: Diagnosis not present

## 2023-07-07 DIAGNOSIS — E7849 Other hyperlipidemia: Secondary | ICD-10-CM | POA: Diagnosis not present

## 2023-07-07 DIAGNOSIS — E1121 Type 2 diabetes mellitus with diabetic nephropathy: Secondary | ICD-10-CM | POA: Diagnosis not present

## 2023-07-09 ENCOUNTER — Ambulatory Visit (HOSPITAL_COMMUNITY)
Admission: EM | Admit: 2023-07-09 | Discharge: 2023-07-09 | Disposition: A | Discharge status: Other Acute Inpatient Hospital | Payer: 59 | Source: Home / Self Care

## 2023-07-09 ENCOUNTER — Encounter (HOSPITAL_COMMUNITY): Payer: Self-pay

## 2023-07-09 ENCOUNTER — Emergency Department (HOSPITAL_COMMUNITY)
Admission: EM | Admit: 2023-07-09 | Discharge: 2023-07-14 | Disposition: A | Discharge status: Rehabilitation Facility | Payer: 59 | Attending: Emergency Medicine | Admitting: Emergency Medicine

## 2023-07-09 ENCOUNTER — Other Ambulatory Visit: Payer: Self-pay

## 2023-07-09 DIAGNOSIS — E119 Type 2 diabetes mellitus without complications: Secondary | ICD-10-CM | POA: Insufficient documentation

## 2023-07-09 DIAGNOSIS — Z743 Need for continuous supervision: Secondary | ICD-10-CM | POA: Diagnosis not present

## 2023-07-09 DIAGNOSIS — I10 Essential (primary) hypertension: Secondary | ICD-10-CM | POA: Insufficient documentation

## 2023-07-09 DIAGNOSIS — M170 Bilateral primary osteoarthritis of knee: Secondary | ICD-10-CM | POA: Insufficient documentation

## 2023-07-09 DIAGNOSIS — F1193 Opioid use, unspecified with withdrawal: Secondary | ICD-10-CM | POA: Diagnosis not present

## 2023-07-09 DIAGNOSIS — F1124 Opioid dependence with opioid-induced mood disorder: Secondary | ICD-10-CM | POA: Insufficient documentation

## 2023-07-09 DIAGNOSIS — T887XXA Unspecified adverse effect of drug or medicament, initial encounter: Secondary | ICD-10-CM | POA: Diagnosis not present

## 2023-07-09 DIAGNOSIS — F1994 Other psychoactive substance use, unspecified with psychoactive substance-induced mood disorder: Secondary | ICD-10-CM

## 2023-07-09 DIAGNOSIS — F1920 Other psychoactive substance dependence, uncomplicated: Secondary | ICD-10-CM | POA: Diagnosis not present

## 2023-07-09 DIAGNOSIS — G894 Chronic pain syndrome: Secondary | ICD-10-CM | POA: Insufficient documentation

## 2023-07-09 DIAGNOSIS — F339 Major depressive disorder, recurrent, unspecified: Secondary | ICD-10-CM | POA: Diagnosis not present

## 2023-07-09 DIAGNOSIS — F1110 Opioid abuse, uncomplicated: Secondary | ICD-10-CM | POA: Diagnosis present

## 2023-07-09 DIAGNOSIS — F4323 Adjustment disorder with mixed anxiety and depressed mood: Secondary | ICD-10-CM | POA: Insufficient documentation

## 2023-07-09 DIAGNOSIS — J441 Chronic obstructive pulmonary disease with (acute) exacerbation: Secondary | ICD-10-CM

## 2023-07-09 DIAGNOSIS — F419 Anxiety disorder, unspecified: Secondary | ICD-10-CM | POA: Diagnosis present

## 2023-07-09 DIAGNOSIS — R6889 Other general symptoms and signs: Secondary | ICD-10-CM | POA: Diagnosis not present

## 2023-07-09 DIAGNOSIS — T50904A Poisoning by unspecified drugs, medicaments and biological substances, undetermined, initial encounter: Secondary | ICD-10-CM | POA: Diagnosis not present

## 2023-07-09 DIAGNOSIS — F32A Depression, unspecified: Secondary | ICD-10-CM | POA: Insufficient documentation

## 2023-07-09 LAB — COMPREHENSIVE METABOLIC PANEL
ALT: 17 U/L (ref 0–44)
AST: 23 U/L (ref 15–41)
Albumin: 4.6 g/dL (ref 3.5–5.0)
Alkaline Phosphatase: 93 U/L (ref 38–126)
Anion gap: 10 (ref 5–15)
BUN: 11 mg/dL (ref 8–23)
CO2: 25 mmol/L (ref 22–32)
Calcium: 10.1 mg/dL (ref 8.9–10.3)
Chloride: 102 mmol/L (ref 98–111)
Creatinine, Ser: 0.59 mg/dL (ref 0.44–1.00)
GFR, Estimated: >60 mL/min (ref >60)
Glucose, Bld: 109 mg/dL — ABNORMAL HIGH (ref 70–99)
Potassium: 3.8 mmol/L (ref 3.5–5.1)
Sodium: 137 mmol/L (ref 135–145)
Total Bilirubin: 0.6 mg/dL (ref 0.0–1.2)
Total Protein: 8.5 g/dL — ABNORMAL HIGH (ref 6.5–8.1)

## 2023-07-09 LAB — CBC WITH DIFFERENTIAL/PLATELET
Abs Immature Granulocytes: 0.02 10*3/uL (ref 0.00–0.07)
Basophils Absolute: 0 10*3/uL (ref 0.0–0.1)
Basophils Relative: 0 %
Eosinophils Absolute: 0 10*3/uL (ref 0.0–0.5)
Eosinophils Relative: 0 %
HCT: 38.1 % (ref 36.0–46.0)
Hemoglobin: 11.7 g/dL — ABNORMAL LOW (ref 12.0–15.0)
Immature Granulocytes: 0 %
Lymphocytes Relative: 23 %
Lymphs Abs: 1.7 10*3/uL (ref 0.7–4.0)
MCH: 26.5 pg (ref 26.0–34.0)
MCHC: 30.7 g/dL (ref 30.0–36.0)
MCV: 86.4 fL (ref 80.0–100.0)
Monocytes Absolute: 0.7 10*3/uL (ref 0.1–1.0)
Monocytes Relative: 9 %
Neutro Abs: 4.9 10*3/uL (ref 1.7–7.7)
Neutrophils Relative %: 68 %
Platelets: 222 10*3/uL (ref 150–400)
RBC: 4.41 MIL/uL (ref 3.87–5.11)
RDW: 12.6 % (ref 11.5–15.5)
WBC: 7.3 10*3/uL (ref 4.0–10.5)
nRBC: 0 % (ref 0.0–0.2)

## 2023-07-09 LAB — RAPID URINE DRUG SCREEN, HOSP PERFORMED
Amphetamines: NOT DETECTED
Barbiturates: NOT DETECTED
Benzodiazepines: NOT DETECTED
Cocaine: NOT DETECTED
Opiates: NOT DETECTED
Tetrahydrocannabinol: NOT DETECTED

## 2023-07-09 LAB — ETHANOL: Alcohol, Ethyl (B): <10 mg/dL (ref <10)

## 2023-07-09 MED ORDER — COLCHICINE 0.6 MG PO TABS: 0.6000 mg | ORAL_TABLET | Freq: Every day | ORAL | Status: DC | PRN

## 2023-07-09 MED ORDER — CLONIDINE HCL 0.1 MG PO TABS
0.1000 mg | ORAL_TABLET | Freq: Four times a day (QID) | ORAL | Status: AC
  Administered 2023-07-09 – 2023-07-11 (×8): 0.1 mg via ORAL
  Filled 2023-07-09 (×6): qty 1

## 2023-07-09 MED ORDER — NAPROXEN 500 MG PO TABS
500.0000 mg | ORAL_TABLET | Freq: Two times a day (BID) | ORAL | Status: DC | PRN
  Administered 2023-07-10 – 2023-07-13 (×5): 500 mg via ORAL
  Filled 2023-07-09 (×5): qty 1

## 2023-07-09 MED ORDER — HYDROXYZINE HCL 25 MG PO TABS
25.0000 mg | ORAL_TABLET | Freq: Four times a day (QID) | ORAL | Status: DC | PRN
  Administered 2023-07-10 – 2023-07-13 (×4): 25 mg via ORAL
  Filled 2023-07-09 (×4): qty 1

## 2023-07-09 MED ORDER — LOPERAMIDE HCL 2 MG PO CAPS
2.0000 mg | ORAL_CAPSULE | ORAL | Status: DC | PRN
  Administered 2023-07-11: 4 mg via ORAL
  Filled 2023-07-09: qty 2

## 2023-07-09 MED ORDER — DULOXETINE HCL 30 MG PO CPEP
30.0000 mg | ORAL_CAPSULE | Freq: Two times a day (BID) | ORAL | Status: DC
  Administered 2023-07-10 – 2023-07-13 (×9): 30 mg via ORAL
  Filled 2023-07-09 (×9): qty 1

## 2023-07-09 MED ORDER — ALBUTEROL SULFATE HFA 108 (90 BASE) MCG/ACT IN AERS: 2.0000 | INHALATION_SPRAY | RESPIRATORY_TRACT | Status: DC | PRN

## 2023-07-09 MED ORDER — SERTRALINE HCL 50 MG PO TABS
50.0000 mg | ORAL_TABLET | Freq: Every day | ORAL | Status: DC
  Administered 2023-07-10 – 2023-07-13 (×4): 50 mg via ORAL
  Filled 2023-07-09 (×4): qty 1

## 2023-07-09 MED ORDER — VITAMIN D (ERGOCALCIFEROL) 1.25 MG (50000 UNIT) PO CAPS
50000.0000 [IU] | ORAL_CAPSULE | ORAL | Status: DC
  Filled 2023-07-09: qty 1

## 2023-07-09 MED ORDER — CLONIDINE HCL 0.1 MG PO TABS: 0.1000 mg | ORAL_TABLET | Freq: Every day | ORAL | Status: DC

## 2023-07-09 MED ORDER — METHOCARBAMOL 500 MG PO TABS
500.0000 mg | ORAL_TABLET | Freq: Three times a day (TID) | ORAL | Status: DC | PRN
  Administered 2023-07-09 – 2023-07-13 (×8): 500 mg via ORAL
  Filled 2023-07-09 (×8): qty 1

## 2023-07-09 MED ORDER — ACETAMINOPHEN 500 MG PO TABS
500.0000 mg | ORAL_TABLET | Freq: Four times a day (QID) | ORAL | Status: DC | PRN
  Administered 2023-07-10: 1000 mg via ORAL
  Administered 2023-07-11: 500 mg via ORAL
  Administered 2023-07-12: 1000 mg via ORAL
  Filled 2023-07-09 (×2): qty 2
  Filled 2023-07-09: qty 1

## 2023-07-09 MED ORDER — CLONIDINE HCL 0.1 MG PO TABS
0.1000 mg | ORAL_TABLET | Freq: Two times a day (BID) | ORAL | Status: AC
  Administered 2023-07-11 – 2023-07-13 (×4): 0.1 mg via ORAL
  Filled 2023-07-09 (×4): qty 1

## 2023-07-09 MED ORDER — BENZONATATE 100 MG PO CAPS
200.0000 mg | ORAL_CAPSULE | Freq: Three times a day (TID) | ORAL | Status: DC | PRN
  Administered 2023-07-10: 200 mg via ORAL
  Filled 2023-07-09: qty 2

## 2023-07-09 MED ORDER — LINAGLIPTIN 5 MG PO TABS
5.0000 mg | ORAL_TABLET | Freq: Every day | ORAL | Status: DC
  Administered 2023-07-10 – 2023-07-13 (×4): 5 mg via ORAL
  Filled 2023-07-09 (×5): qty 1

## 2023-07-09 MED ORDER — ASPIRIN 81 MG PO TBEC
81.0000 mg | DELAYED_RELEASE_TABLET | Freq: Every day | ORAL | Status: DC
  Administered 2023-07-10 – 2023-07-13 (×4): 81 mg via ORAL
  Filled 2023-07-09 (×4): qty 1

## 2023-07-09 MED ORDER — AMLODIPINE BESYLATE 5 MG PO TABS
10.0000 mg | ORAL_TABLET | Freq: Every day | ORAL | Status: DC
  Administered 2023-07-10 – 2023-07-13 (×4): 10 mg via ORAL
  Filled 2023-07-09 (×4): qty 2

## 2023-07-09 MED ORDER — LOSARTAN POTASSIUM 50 MG PO TABS
100.0000 mg | ORAL_TABLET | Freq: Every day | ORAL | Status: DC
  Administered 2023-07-10 – 2023-07-13 (×4): 100 mg via ORAL
  Filled 2023-07-09 (×5): qty 2

## 2023-07-09 MED ORDER — DICYCLOMINE HCL 20 MG PO TABS
20.0000 mg | ORAL_TABLET | Freq: Four times a day (QID) | ORAL | Status: DC | PRN
  Administered 2023-07-09 – 2023-07-11 (×4): 20 mg via ORAL
  Filled 2023-07-09 (×4): qty 1

## 2023-07-09 MED ORDER — ROSUVASTATIN CALCIUM 5 MG PO TABS
5.0000 mg | ORAL_TABLET | Freq: Every day | ORAL | Status: DC
  Administered 2023-07-10 – 2023-07-13 (×4): 5 mg via ORAL
  Filled 2023-07-09 (×5): qty 1

## 2023-07-09 MED ORDER — ALBUTEROL SULFATE (2.5 MG/3ML) 0.083% IN NEBU: 2.5000 mg | INHALATION_SOLUTION | RESPIRATORY_TRACT | Status: DC | PRN

## 2023-07-09 MED ORDER — TIZANIDINE HCL 4 MG PO TABS
4.0000 mg | ORAL_TABLET | Freq: Every day | ORAL | Status: DC
  Administered 2023-07-10 – 2023-07-13 (×5): 4 mg via ORAL
  Filled 2023-07-09 (×5): qty 1

## 2023-07-09 MED ORDER — EMPAGLIFLOZIN 10 MG PO TABS
10.0000 mg | ORAL_TABLET | Freq: Every morning | ORAL | Status: DC
  Administered 2023-07-10 – 2023-07-13 (×4): 10 mg via ORAL
  Filled 2023-07-09 (×5): qty 1

## 2023-07-09 MED ORDER — ONDANSETRON 4 MG PO TBDP
4.0000 mg | ORAL_TABLET | Freq: Four times a day (QID) | ORAL | Status: DC | PRN
Start: 2023-07-09 — End: 2023-07-14

## 2023-07-09 NOTE — Progress Notes (Signed)
   07/09/23 1026  [facility] Triage Screening (Walk-ins at [facility] only)  How Did You Hear About Us ? Family/Friend  What Is the Reason for Your Visit/Call Today? Wilhite is a 78 year old female presenting to [facility] accompanied by her grand daugther. Pts grand daugther reports her grandmother has a hx of drug abuse. Pt is looking for help for detox. Pt is currently coming off of oxycodium and is looking for a treatment center. Pt reports she has been getting pills from other people. Pt reports she is allergic to gaba and took a half pill today. Pt also reports she took one perk as well. Pt reports that she has abused pills for many years and is looking to get help.  Pt denies alcohol  use, Si, hi and avh.  How Long Has This Been Causing You Problems? > than 6 months  Have You Recently Had Any Thoughts About Hurting Yourself? No  Are You Planning to Commit Suicide/Harm Yourself At This time? No  Have you Recently Had Thoughts About Hurting Someone Sherral? No  Are You Planning To Harm Someone At This Time? No  Physical Abuse Denies  Verbal Abuse Denies  Sexual Abuse Denies  Exploitation of patient/patient's resources Denies  Self-Neglect Denies  Possible abuse reported to: Other (Comment)  Are you currently experiencing any auditory, visual or other hallucinations? No  Have You Used Any Alcohol  or Drugs in the Past 24 Hours? Yes  What Did You Use and How Much? perkasets this morning, gaba half a pill  Do you have any current medical co-morbidities that require immediate attention? No  Clinician description of patient physical appearance/behavior: calm, cooperative  What Do You Feel Would Help You the Most Today? Medication(s)  If access to [facility] was not available, would you have sought care in the Emergency Department? No  Determination of Need Urgent (48 hours)  Options For Referral Facility-Based Crisis  Determination of Need filed? Yes
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
## 2023-07-09 NOTE — ED Provider Notes (Signed)
 [place] EMERGENCY DEPARTMENT AT [facility] Provider Note   CSN: [id number] Arrival date & time: 07/09/23  1343     History  Chief Complaint  Patient presents with   Detox    Ashley Riddle is a 78 y.o. female.  HPI    78 year old female comes in with chief complaint of detox.  She has history of COPD, substance use disorder, chronic pain syndrome.  She is on oxycodone  and Belbuca  for pain management.  Patient states that she ran out of her pain medication few days back.  Her last oxycodone  was earlier today.  She has been having bodyaches all over along with feeling restless and having diarrhea.  She had gone to behavioral health urgent care, they sent her to the ER for further assessment and management. Home Medications Prior to Admission medications   Medication Sig Start Date End Date Taking? Authorizing Provider  acetaminophen  (TYLENOL ) 500 MG tablet Take 1,000 mg by mouth every 8 (eight) hours as needed for moderate pain.  12/16/19   [provider]      Allergies    Gabapentin , Pregabalin, Aspirin , and Penicillins    Review of Systems   Review of Systems  All other systems reviewed and are negative.   Physical Exam Updated Vital Signs BP (!) 174/95   Pulse 89   Temp 98.6 F (37 C) (Oral)   Resp 20   Ht 5' 1 (1.549 m)   Wt 88.5 kg   SpO2 99%   BMI 36.87 kg/m  Physical Exam Vitals and nursing note reviewed.  Constitutional:      Appearance: She is well-developed. She is not toxic-appearing or diaphoretic.  HENT:     Head: Atraumatic.  Cardiovascular:     Rate and Rhythm: Normal rate.  Pulmonary:     Effort: Pulmonary effort is normal.  Musculoskeletal:     Cervical back: Neck supple.  Skin:    General: Skin is warm and dry.  Neurological:     Mental Status: She is alert and oriented to person, place, and time.  Psychiatric:     Comments: Appears restless     ED Results / Procedures / Treatments   Labs (all labs  ordered are listed, but only abnormal results are displayed) Labs Reviewed  COMPREHENSIVE METABOLIC PANEL - Abnormal; Notable for the following components:      Result Value   Glucose, Bld 109 (*)    Total Protein 8.5 (*)    All other components within normal limits  CBC WITH DIFFERENTIAL/PLATELET - Abnormal; Notable for the following components:   Hemoglobin 11.7 (*)    All other components within normal limits  ETHANOL  RAPID URINE DRUG SCREEN, HOSP PERFORMED    EKG None  Radiology No results found.  Procedures Procedures    Medications Ordered in ED Medications  cloNIDine  (CATAPRES ) tablet 0.1 mg (has no administration in time range)    Followed by  cloNIDine  (CATAPRES ) tablet 0.1 mg (has no administration in time range)    Followed by  cloNIDine  (CATAPRES ) tablet 0.1 mg (has no administration in time range)  dicyclomine  (BENTYL ) tablet 20 mg (has no administration in time range)  hydrOXYzine  (ATARAX ) tablet 25 mg (has no administration in time range)  loperamide  (IMODIUM ) capsule 2-4 mg (has no administration in time range)  methocarbamol  (ROBAXIN ) tablet 500 mg (has no administration in time range)  naproxen  (NAPROSYN ) tablet 500 mg (has no administration in time range)  ondansetron  (ZOFRAN -ODT) disintegrating tablet 4  mg (has no administration in time range)    ED Course/ Medical Decision Making/ A&P                                 Medical Decision Making Amount and/or Complexity of Data Reviewed Labs: ordered.  Risk Prescription drug management.   Patient comes to the ER with cc of opioid withdrawal symptoms which include body aches, diarrhea, feeling restless Patient has pertinent past medical history of chronic pain syndrome, chronic narcotic use Currently patient is restless.  Pt denies fevers, chills, chest pains, shortness of breath, headaches, abdominal pain, uti like symptoms. She reports nausea, diarrhea, body aches. I have reviewed previous  encounters for this patient and reviewed their primary medications.  Differential diagnosis considered for this patient includes: Opiate withdrawals, electrolyte abnormality, delirium, medication toxicity.  Appropriate labs have been ordered. COWS ordered and clonidine  withdrawal protocol initiated. Patient is medically cleared for psychiatric evaluation.   Final Clinical Impression(s) / ED Diagnoses Final diagnoses:  Opiate withdrawal [facility])    Rx / DC Orders ED Discharge Orders     None         Charlyn Sora, MD 07/09/23 2144

## 2023-07-09 NOTE — BH Assessment (Signed)
@   2212, Clinician deferred the patient to [facility]. The care coordinator is Lauryn, and can be reached at [phone]. Tommy will notify the care team of the scheduled time for the consult. If the patient's care team has any questions or concerns, please contact [facility].

## 2023-07-09 NOTE — ED Notes (Signed)
 EMS here to transport to [facility]. Pt left via EMS on stretcher. Pt a/o, safety maintained.

## 2023-07-09 NOTE — ED Triage Notes (Signed)
 Patient sent over from [facility] for medical clearance. Patient reports wanting to detox from percocet's. States her last pill was this morning. Reports pain all over.

## 2023-07-10 DIAGNOSIS — F4323 Adjustment disorder with mixed anxiety and depressed mood: Secondary | ICD-10-CM

## 2023-07-10 NOTE — ED Provider Notes (Signed)
 Emergency Medicine Observation Re-evaluation Note  Ashley Riddle is a 78 y.o. female, seen on rounds today.  Pt initially presented to the ED for complaints of Detox Currently, the patient is resting in bed with her family member at bedside.Ashley Riddle  Physical Exam  BP (!) 141/76 (BP Location: Left Arm)   Pulse 76   Temp 98.6 F (37 C) (Oral)   Resp 20   Ht 5' 1 (1.549 m)   Wt 88.5 kg   SpO2 98%   BMI 36.87 kg/m  Physical Exam General: No acute distress Cardiac: Regular rate Lungs: Clear Psych: Calm and cooperative  ED Course / MDM  EKG:   I have reviewed the labs performed to date as well as medications administered while in observation.  Recent changes in the last 24 hours include evaluation by telepsychiatry.  Plan  Current plan is for patient was cleared by psychiatry and did not feel that she met inpatient criteria but needed extensive outpatient psychotherapy and resources for substance abuse.  It was not felt that patient was an imminent threat to herself or others.  Speaking with patient she is still in immense pain in her legs now that she has not had any opiates.  Sounds like she had run out of her pain medication and took a Vicodin from a friend and was kicked out of her pain clinic in mid January.  Since that time she has been buying opiates off the street and last took an oxycodone  yesterday.  She has been taking withdrawal medications here but is still having diffuse pain.  She reports being depressed but does not have a plan for suicide.  Will discuss with TTS to see if she may be a candidate for [facility] for ongoing withdrawal symptoms treatment possibly Suboxone  for the future.Ashley Riddle Spoke with Ashley Riddle and she is discussing with the [facility] to see if pt can be transferred or if she needs a repeat consult.    Doretha Folks, MD 07/10/23 [phone]

## 2023-07-10 NOTE — ED Notes (Signed)
 Patient calm and cooperative at this time. Ongoing complaints of pain.
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
## 2023-07-10 NOTE — ED Notes (Addendum)
 TTS completed pt returns to sleep COWS improved after medication for pain
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
## 2023-07-10 NOTE — ED Notes (Signed)
 Pt not recommended for inpatient care per [facility]IS evaluator.

## 2023-07-10 NOTE — ED Notes (Signed)
Patient ambulatory  

## 2023-07-10 NOTE — Progress Notes (Addendum)
 Agree with assessment from ARNP Jadeka. I saw patient as well and her COWS is currently 8-10 and is not in sufficient withdrawal to start induction. Patient does appear very motivated and after discussion of r/b/a interested in Suboxone  induction. Plan would be to induce when COWS >12 which was discussed with patient. Will reassess in AM. This can also be completed via IOP program but would have to closely monitor hydration, ins and outs as patient will be more susceptible to falls and dehydration given her age. Patient denies SI, HI or AVH. Patient denies benzodiazepine or alcohol  use. Patient reports long history of opioid use disorder and was previously on methadone for opioid use disorder but did not like it. Patient would like to complete suboxone  induction. Reports buying pain pills off the street and using more than intented as well as difficulty cutting back. States she would like to stop using as it has taken control over her life. Adamant she has not used any opioids for >24 hours with last use of Percocet on the morning of 07/09/23. Patient is a good candidate for suboxone  inducation and referral for MAT treatment. UDS was negative.

## 2023-07-11 DIAGNOSIS — F4323 Adjustment disorder with mixed anxiety and depressed mood: Secondary | ICD-10-CM | POA: Diagnosis not present

## 2023-07-11 MED ORDER — BUPRENORPHINE HCL-NALOXONE HCL 2-0.5 MG SL SUBL
1.0000 | SUBLINGUAL_TABLET | Freq: Once | SUBLINGUAL | Status: AC
  Administered 2023-07-11: 1 via SUBLINGUAL
  Filled 2023-07-11: qty 1

## 2023-07-11 MED ORDER — BUPRENORPHINE HCL-NALOXONE HCL 2-0.5 MG SL SUBL
2.0000 | SUBLINGUAL_TABLET | Freq: Once | SUBLINGUAL | Status: AC
  Administered 2023-07-11: 2 via SUBLINGUAL
  Filled 2023-07-11: qty 2

## 2023-07-11 NOTE — ED Provider Notes (Signed)
 Follow up note @ 1700: patient received her first dose of Suboxone  4 mg @ 1401, patient tolerated well, no signs of sedation or respiratory depression. Patient is sitting on the edge of bed, drinking some juice, patient showed provider that she ate about 75% of dinner and had about 24 oz of fluid in the last few hours, patient states she feels much better and is having an appetite. Spoke with Dr. Zouev and will give patient 2 mg extra, and then she will get 6 mg in the AM tomorrow. Due to age and because she received the 4 mg later in the afternoon will just finish this day with another 2 mg of Suboxone . Patient COW score at 1700 was 6.

## 2023-07-11 NOTE — ED Provider Notes (Signed)
 Emergency Medicine Observation Re-evaluation Note  Ashley Riddle is a 78 y.o. female, seen on rounds today.  Pt initially presented to the ED for complaints of Detox Currently, the patient is resting comfortably in bed.  No concerns  Physical Exam  BP 122/80 (BP Location: Left Arm)   Pulse 70   Temp 97.9 F (36.6 C) (Oral)   Resp 16   Ht 5' 1 (1.549 m)   Wt 88.5 kg   SpO2 99%   BMI 36.87 kg/m  Physical Exam General: Resting comfortably Lungs: Normal respiratory effort Psych: Calm, cooperative  ED Course / MDM  EKG:   I have reviewed the labs performed to date as well as medications administered while in observation.  Recent changes in the last 24 hours include none.  Plan  Current plan is for TTS evaluation.  Patient presented and was cleared by psychiatry but is still having symptoms of opioid withdrawal.    Ashley Riddle DEL, MD 07/11/23 1001

## 2023-07-11 NOTE — ED Notes (Addendum)
 Pt currently sleeping. Chest rise and fall noted, Safety sitter remains at bedside.

## 2023-07-11 NOTE — Consult Note (Signed)
 [facility] Psychiatric Consult Follow-up  Patient Name: .Ashley Riddle  MRN: [id number]  DOB: 01-04-1946  Consult Order details:  Orders (From admission, onward)     Start     Ordered   07/10/23 1806  CONSULT TO CALL ACT TEAM       Ordering Provider: Jerral Meth, MD  Provider:  (Not yet assigned)  Question:  Reason for Consult?  Answer:  SI/SUD   07/10/23 1805   07/09/23 2137  CONSULT TO CALL ACT TEAM       Ordering Provider: Charlyn Sora, MD  Provider:  (Not yet assigned)  Question:  Reason for Consult?  Answer:  Psych consult   07/09/23 2137             Mode of Visit: In person    Psychiatry Consult Evaluation  Service Date: July 11, 2023 LOS:  LOS: 0 days  Chief Complaint  I've been on oxycodone  for a while and I want to detox to get off of pain meds.   Primary Psychiatric Diagnoses  Opiate abuse MDD Anxiety   Assessment  Ashley Riddle is a 78 y.o. female admitted: Presented to the ED on 07/09/2023  2:06 PM for detox to get off of pain meds.  She carries the psychiatric diagnoses of opiate abuse, major depressive disorder, and anxiety and has a past medical history of rheumatoid arthritis, and COPD, chronic pain.     On evaluation today, the patient is sitting on the side of her bed, appears to be in moderate distress, due to possible withdrawals. Patient is restless as she continues to move from side to side. She is also irritable as she is talking with this provider, and this provider asked her a question, patient answered the day before and patient stated didn't I tell you that yesterday. Patient then becomes tearful, as she says she is sad, she was just visited by her son and when he visited he became tearful and she states  I dont like what I am putting my children through. More frustration and irritability noted today. She also states she has been sweaty, stating she just got out of the shower and had her bed sheets changed, the RN  confirmed. RN also confirmed that patient is having loose stools.  Her appearance is appropriate for environment. Her eye contact is good. Speech is clear and coherent, normal pace and normal volume. She is alert and oriented x4 to person, place, time, and situation, states she is having a rough day and feeling rough. She reports her mood is frustrated due to pain.  Affect is congruent with mood.  Thought process is coherent.  Thought content is within normal limits.  She denies auditory and visual hallucinations.  No indication that she is responding to internal stimuli during this assessment.  No delusions elicited during this assessment. She denies suicidal ideations.  She denies homicidal ideations. Appetite is poor and sleep is fair. Patient does not have any teeth, states she does not like a lot of food here, she also has not been drinking a lot of fluids, she has 3 8-oz cups in her room filed with water.  This provider discussed the plan of care with patient, patient is ready to be discharged but states she will follow orders, as she wants help. Discussed with her the possibility of going to an outpatient Medicated Assisted Treatment facility and she agreed, also dicussed the option of starting Suboxone , explaining to the patient that it is a  partial opioid agonist, let her know that it is not meant to control pain, and will help with detox of opioids, also informed her of regular drug testing.  Patient states that she is interested in starting Suboxone . Patient COW scale score as of 1000 am 07/11/23 rated at a 12. Please see plan below for detailed recommendations.    Diagnoses:  Active Hospital problems: Principal Problem:   Opiate abuse, episodic (HCC) Active Problems:   Anxiety and depression    Plan   ## Psychiatric Medication Recommendations:  Continue patient's home medications Start Suboxone  4 mg (2-0.5 mg per SL tablet 2 tablet), will reassess COW score in 2 hours    ## Medical  Decision Making Capacity:  Patient is her own legal guardian   ## Further Work-up:  -- Continue to monitor patient's COW scores EKG or UDS -- most recent EKG on 07/10/23 had QtC of 432 -- Pertinent labwork reviewed earlier this admission includes: CMP, BMP, EKG, UDS     ## Disposition:--Will continue to treat patient and monitor her while in the ER as she has been started on Suboxone  induction and today is Day 1. Will reassess in the morning.   ## Behavioral / Environmental: -To minimize splitting of staff, assign one staff person to communicate all information from the team when feasible. or Utilize compassion and acknowledge the patient's experiences while setting clear and realistic expectations for care.                ## Safety and Observation Level:  - Based on my clinical evaluation, I estimate the patient to be at no risk of self harm in the current setting. - At this time, we recommend  routine. This decision is based on my review of the chart including patient's history and current presentation, interview of the patient, mental status examination, and consideration of suicide risk including evaluating suicidal ideation, plan, intent, suicidal or self-harm behaviors, risk factors, and protective factors. This judgment is based on our ability to directly address suicide risk, implement suicide prevention strategies, and develop a safety plan while the patient is in the clinical setting. Please contact our team if there is a concern that risk level has changed.   CSSR Risk Category:C-SSRS RISK CATEGORY: No Risk   Suicide Risk Assessment: Patient has following modifiable risk factors for suicide: none, which we are addressing by continuing to treat patient and monitor her while in the ER as she has been started on Suboxone  induction and today is Day 1. Will reassess in the morning.. Patient has following non-modifiable or demographic risk factors for suicide: none Patient has the following  protective factors against suicide: Access to outpatient mental health care, Supportive family, and Cultural, spiritual, or religious beliefs that discourage suicide   Thank you for this consult request. Recommendations have been communicated to the primary team.  We will continue to treat patient and monitor her while in the ER as she has been started on Suboxone  induction and today is Day 1. Will reassess in the morning and continue to follow patient at this time.   Tsion Inghram MOTLEY-MANGRUM, PMHNP       History of Present Illness  Relevant Aspects of Hospital ED Course:  Admitted on 07/09/2023 for I've been on oxycodone  for a while and I want to detox to get off of pain meds.     Patient Report:  On evaluation today, the patient is sitting on the side of her bed, appears to be in moderate  distress, due to possible withdrawals. Patient is restless as she continues to move from side to side. She is also irritable as she is talking with this provider, and this provider asked her a question, patient answered the day before and patient stated didn't I tell you that yesterday. Patient then becomes tearful, as she says she is sad, she was just visited by her son and when he visited he became tearful and she states  I dont like what I am putting my children through. More frustration and irritability noted today. She also states she has been sweaty, stating she just got out of the shower and had her bed sheets changed, the RN confirmed. RN also confirmed that patient is having loose stools.  Her appearance is appropriate for environment. Her eye contact is good. Speech is clear and coherent, normal pace and normal volume. She is alert and oriented x4 to person, place, time, and situation, states she is having a rough day and feeling rough. She reports her mood is frustrated due to pain.  Affect is congruent with mood.  Thought process is coherent.  Thought content is within normal limits.  She denies auditory  and visual hallucinations.  No indication that she is responding to internal stimuli during this assessment.  No delusions elicited during this assessment. She denies suicidal ideations.  She denies homicidal ideations. Appetite is poor and sleep is fair. Patient does not have any teeth, states she does not like a lot of food here, she also has not been drinking a lot of fluids, she has 3 8-oz cups in her room filed with water.  This provider discussed the plan of care with patient, patient is ready to be discharged but states she will follow orders, as she wants help. Discussed with her the possibility of going to an outpatient Medicated Assisted Treatment facility and she agreed, also dicussed the option of starting Suboxone , explaining to the patient that it is a partial opioid agonist, let her know that it is not meant to control pain, and will help with detox of opioids, also informed her of regular drug testing.  Patient states that she is interested in starting Suboxone . Patient COW scale score as of 1000 am 07/11/23 rated at a 12. Please see plan below for detailed recommendations.   Psych ROS:  Depression: Positive  Anxiety:  Positive Mania (lifetime and current): Denies Psychosis: (lifetime and current): Denies   Collateral information:  Contacted: Spoke with patient daughter Apolinar and granddaughter Duwaine on 07/11/23, and they stated they are very supportive in patient life and they are willing to do what they need to, to assist. Patient granddaughter is in the healthcare field and states if she has to administer patients medications she will, she has no problem. Discussed with them starting patient on  Suboxone , explaining to them that it is a partial opioid agonist, let them know that it is not meant to control pain, and will help with detox of opioids, also informed them of patient receiving regular drug testing. They are in agreement that they are ok with patient starting Suboxone  therapy.   Apolinar is concerned of patient being in pain and wants to know how to deal with the pain issue, this provider informed her, that the detox should be priority and the proper facility should also assist with pain. They were also given information to call [facility] [address] [place], [place] [address] [phone]  [facility] offers comprehensive outpatient care for opioid addiction, including Medication-Assisted Treatment (MAT) with methadone, buprenorphine , and Suboxone . Services include individual, group, and family counseling, as well as medically supervised withdrawal and regular health monitoring. Patients benefit from take-home medication options, educational resources, and NARCAN  distribution for overdose prevention. Reviews highlight supportive staff, flexible scheduling, and a welcoming environment focused on recovery. This facility is designed to help individuals maintain daily responsibilities while addressing addiction effectively.   @ 1400 The family made an appointment with [facility] for Monday morning on 07/14/23.     ROS   Psychiatric and Social History  Psychiatric History:  Information collected from patient    Prev Dx/Sx: Depression, Opioid abuse Current Psych Provider: None Home Meds (current): see above Previous Med Trials: Denies Therapy: None   Prior Psych Hospitalization: Denies  Prior Self Harm: Denies Prior Violence: Denies   Family Psych History: Denies  Family Hx suicide: Denies   Social History:  Developmental Hx: Patient appears age appropriate  Educational Hx: Patient graduated high school Occupational Hx: Unemployed  Legal Hx: Denies Living Situation: Lives in apartment with grandson Spiritual Hx: [employer] Access to weapons/lethal means: Denies    Substance History Alcohol : Denies  Type of alcohol  N/A Last Drink N/A Number of drinks per day  Denies History of alcohol  withdrawal seizures Denies History of DT's Denies Tobacco: Denies Illicit drugs: Denies Prescription drug abuse: Yes Rehab hx: Yes  Exam Findings  Physical Exam:  Vital Signs:  Temp:  [97.9 F (36.6 C)-98.3 F (36.8 C)] 97.9 F (36.6 C) (02/07 0512) Pulse Rate:  [70-78] 70 (02/07 0512) Resp:  [16-20] 16 (02/07 0512) BP: (122-152)/(71-80) 122/80 (02/07 0512) SpO2:  [95 %-99 %] 99 % (02/07 0512) Blood pressure 122/80, pulse 70, temperature 97.9 F (36.6 C), temperature source Oral, resp. rate 16, height 5' 1 (1.549 m), weight 88.5 kg, SpO2 99%. Body mass index is 36.87 kg/m.  Physical Exam  Mental Status Exam: General Appearance: Casual  Orientation:  Full (Time, Place, and Person)  Memory:  Immediate;   Good Remote;   Good  Concentration:  Concentration: Good and Attention Span: Good  Recall:  Good  Attention  Fair  Eye Contact:  Good  Speech:  Clear and Coherent  Language:  Good  Volume:  Normal  Mood: agitated  Affect:  Congruent  Thought Process:  Coherent  Thought Content:  Logical  Suicidal Thoughts:  No  Homicidal Thoughts:  No  Judgement:  Fair  Insight:  Fair  Psychomotor Activity:  Normal  Akathisia:  No  Fund of Knowledge:  Fair      Assets:  Manufacturing Systems Engineer Desire for Improvement Financial Resources/Insurance Housing Social Support  Cognition:  WNL  ADL's:  Intact  AIMS (if indicated):        Other History   These have been pulled in through the EMR, reviewed, and updated if appropriate.  Family History:  The patient's family history includes Alcohol  abuse in her mother; Breast cancer (age of onset: 49) in her cousin; Breast cancer (age of onset: 76) in her daughter; Heart disease in her mother; Kidney disease in her mother.  Medical History: Past Medical History:  Diagnosis Date   Anxiety    Arthritis    CAP (community acquired pneumonia) 02/18/2017   Constipation    COPD (chronic obstructive pulmonary  disease) (HCC)    DDD (degenerative disc disease), cervical    DDD (degenerative disc disease), lumbar    Gout  Heart murmur    probable bicuspid AV with mild AS, mild MR by 04/22/14 Echo (Dr. Andria)   Hepatitis C    treated 2016    History of blood transfusion    Hypertension    Hypomagnesemia 03/02/2017   Pneumonia 12/02/2013   Sepsis (HCC) 04/19/2020    Surgical History: Past Surgical History:  Procedure Laterality Date   ABDOMINAL HYSTERECTOMY     APPENDECTOMY     BACK SURGERY     CESAREAN SECTION     x 3   COLONOSCOPY W/ POLYPECTOMY     HARDWARE REMOVAL Left 12/26/2015   Procedure: REMOVAL K-WIRE LEFT SCAPULA;  Surgeon: Maude LELON Right, MD;  Location: [facility] OR;  Service: Orthopedics;  Laterality: Left;   INNER EAR SURGERY     blood vessel   TONSILLECTOMY     TOTAL SHOULDER ARTHROPLASTY Left 12/27/2014   Procedure: TOTAL SHOULDER ARTHROPLASTY;  Surgeon: Maude LELON Right, MD;  Location: [facility] OR;  Service: Orthopedics;  Laterality: Left;     Medications:   Current Facility-Administered Medications:    acetaminophen  (TYLENOL ) tablet 500-1,000 mg, 500-1,000 mg, Oral, Q6H PRN, Nanavati, Ankit, MD, 500 mg at 07/11/23 0533   albuterol  (PROVENTIL ) (2.5 MG/3ML) 0.083% nebulizer solution 2.5 mg, 2.5 mg, Nebulization, Q4H PRN, Nanavati, Ankit, MD   amLODipine  (NORVASC ) tablet 10 mg, 10 mg, Oral, Daily, Nanavati, Ankit, MD, 10 mg at 07/11/23 1014   aspirin  EC tablet 81 mg, 81 mg, Oral, Daily, Nanavati, Ankit, MD, 81 mg at 07/11/23 1014   benzonatate  (TESSALON ) capsule 200 mg, 200 mg, Oral, TID PRN, Nanavati, Ankit, MD, 200 mg at 07/10/23 9364   cloNIDine  (CATAPRES ) tablet 0.1 mg, 0.1 mg, Oral, QID, 0.1 mg at 07/11/23 1014 **FOLLOWED BY** cloNIDine  (CATAPRES ) tablet 0.1 mg, 0.1 mg, Oral, BID **FOLLOWED BY** [START ON 07/14/2023] cloNIDine  (CATAPRES ) tablet 0.1 mg, 0.1 mg, Oral, Daily, Nanavati, Ankit, MD   colchicine  tablet 0.6 mg, 0.6 mg, Oral, Daily PRN, Nanavati, Ankit, MD    dicyclomine  (BENTYL ) tablet 20 mg, 20 mg, Oral, Q6H PRN, Nanavati, Ankit, MD, 20 mg at 07/11/23 0533   DULoxetine  (CYMBALTA ) DR capsule 30 mg, 30 mg, Oral, BID, Nanavati, Ankit, MD, 30 mg at 07/11/23 1014   empagliflozin  (JARDIANCE ) tablet 10 mg, 10 mg, Oral, q AM, Nanavati, Ankit, MD, 10 mg at 07/11/23 1015   hydrOXYzine  (ATARAX ) tablet 25 mg, 25 mg, Oral, Q6H PRN, Nanavati, Ankit, MD, 25 mg at 07/10/23 1115   linagliptin  (TRADJENTA ) tablet 5 mg, 5 mg, Oral, Daily, Nanavati, Ankit, MD, 5 mg at 07/11/23 1025   loperamide  (IMODIUM ) capsule 2-4 mg, 2-4 mg, Oral, PRN, Nanavati, Ankit, MD, 4 mg at 07/11/23 1019   losartan  (COZAAR ) tablet 100 mg, 100 mg, Oral, Daily, Nanavati, Ankit, MD, 100 mg at 07/11/23 1014   methocarbamol  (ROBAXIN ) tablet 500 mg, 500 mg, Oral, Q8H PRN, Nanavati, Ankit, MD, 500 mg at 07/11/23 0533   naproxen  (NAPROSYN ) tablet 500 mg, 500 mg, Oral, BID PRN, Nanavati, Ankit, MD, 500 mg at 07/10/23 1115   ondansetron  (ZOFRAN -ODT) disintegrating tablet 4 mg, 4 mg, Oral, Q6H PRN, Nanavati, Ankit, MD   rosuvastatin  (CRESTOR ) tablet 5 mg, 5 mg, Oral, Daily, Nanavati, Ankit, MD, 5 mg at 07/11/23 1015   sertraline  (ZOLOFT ) tablet 50 mg, 50 mg, Oral, Daily, Nanavati, Ankit, MD, 50 mg at 07/11/23 1014   tiZANidine  (ZANAFLEX ) tablet 4 mg, 4 mg, Oral, QHS, Nanavati, Ankit, MD, 4 mg at 07/10/23 2228   [START ON 07/14/2023] Vitamin D  (Ergocalciferol ) (DRISDOL ) 1.25 MG (  50000 UNIT) capsule 50,000 Units, 50,000 Units, Oral, Q Mon, Nanavati, Ankit, MD  Current Outpatient Medications:    albuterol  (VENTOLIN  HFA) 108 (90 Base) MCG/ACT inhaler, Inhale 2 puffs into the lungs every 4 (four) hours as needed for wheezing or shortness of breath., Disp: , Rfl:    amLODipine  (NORVASC ) 10 MG tablet, Take 10 mg by mouth daily., Disp: , Rfl:    aspirin  EC 81 MG tablet, Take 81 mg by mouth daily. Swallow whole., Disp: , Rfl:    BELBUCA  750 MCG FILM, Place 750 mcg inside cheek See admin instructions. Place 750 mcg  inside of the cheek at 11 AM and 11 PM, Disp: , Rfl:    benzonatate  (TESSALON ) 200 MG capsule, Take 200 mg by mouth 3 (three) times daily as needed for cough., Disp: , Rfl:    colchicine  0.6 MG tablet, Take 0.6 mg by mouth daily as needed (as directed for gout flares)., Disp: , Rfl:    DULoxetine  (CYMBALTA ) 30 MG capsule, Take 30 mg by mouth 2 (two) times daily., Disp: , Rfl:    ergocalciferol  (VITAMIN D2) 1.25 MG (50000 UT) capsule, Take 50,000 Units by mouth every Monday., Disp: , Rfl:    JANUVIA 50 MG tablet, Take 50 mg by mouth daily., Disp: , Rfl:    JARDIANCE  10 MG TABS tablet, Take 10 mg by mouth in the morning., Disp: , Rfl:    losartan  (COZAAR ) 100 MG tablet, Take 100 mg by mouth daily., Disp: , Rfl:    rosuvastatin  (CRESTOR ) 5 MG tablet, Take 5 mg by mouth daily., Disp: , Rfl:    sertraline  (ZOLOFT ) 50 MG tablet, Take 50 mg by mouth daily., Disp: , Rfl:    tiZANidine  (ZANAFLEX ) 4 MG tablet, Take 4 mg by mouth at bedtime., Disp: , Rfl:    TYLENOL  500 MG tablet, Take 500-1,000 mg by mouth every 6 (six) hours as needed (for pain)., Disp: , Rfl:   Allergies: Allergies  Allergen Reactions   Gabapentin  Other (See Comments)    Dizziness- States was admitted to hospital     Pregabalin Other (See Comments)    Confusion   Aspirin  Other (See Comments)    Due to history of hepatitis   Codeine Other (See Comments)    Makes the stomach cramp   Penicillins Hives, Swelling and Other (See Comments)    Bertice Risse MOTLEY-MANGRUM, PMHNP

## 2023-07-12 DIAGNOSIS — F4323 Adjustment disorder with mixed anxiety and depressed mood: Secondary | ICD-10-CM | POA: Diagnosis not present

## 2023-07-12 DIAGNOSIS — F1110 Opioid abuse, uncomplicated: Secondary | ICD-10-CM

## 2023-07-12 MED ORDER — BUPRENORPHINE HCL-NALOXONE HCL 2-0.5 MG SL SUBL
3.0000 | SUBLINGUAL_TABLET | Freq: Every day | SUBLINGUAL | Status: DC
  Administered 2023-07-12 – 2023-07-13 (×2): 3 via SUBLINGUAL
  Filled 2023-07-12 (×2): qty 3

## 2023-07-12 MED ORDER — BUPRENORPHINE HCL-NALOXONE HCL 2-0.5 MG SL FILM: 6.0000 mg | ORAL_FILM | Freq: Every day | SUBLINGUAL | 0 refills | Status: AC

## 2023-07-12 NOTE — ED Notes (Signed)
 Pt clothes given to daughter to take home from locker 30.

## 2023-07-12 NOTE — ED Provider Notes (Addendum)
 Emergency Medicine Observation Re-evaluation Note  Ashley Riddle is a 78 y.o. female, seen on rounds today.  Pt initially presented to the ED for complaints of Detox Currently, the patient is asleep.  Pt presented to the ED for help with opiate detox.  She is on a suboxone  taper.  BH plans to reassess today.  Physical Exam  BP 112/64 (BP Location: Left Arm)   Pulse 64   Temp 98.4 F (36.9 C)   Resp 18   Ht 5' 1 (1.549 m)   Wt 88.5 kg   SpO2 96%   BMI 36.87 kg/m  Physical Exam General: asleep Cardiac: rr Lungs: clear Psych: asleep  ED Course / MDM  EKG:EKG Interpretation Date/Time:  Thursday July 10 2023 15:19:04 EST Ventricular Rate:  81 PR Interval:  184 QRS Duration:  88 QT Interval:  372 QTC Calculation: 432 R Axis:   15  Text Interpretation: Normal sinus rhythm Nonspecific ST and T wave abnormality Abnormal ECG When compared with ECG of 25-Apr-2022 11:20, PREVIOUS ECG IS PRESENT Artifact Confirmed by Dreama Longs ([id number]) on 07/11/2023 9:59:11 PM  I have reviewed the labs performed to date as well as medications administered while in observation.  Recent changes in the last 24 hours include suboxone .  Plan  Current plan is for reassessment today.    Dean Clarity, MD 07/12/23 [phone]  Pt has been seen again by psych and they recommend d/c with suboxone  6 mg daily for 5 days.  Pt's family made an appt for Monday with a treatment center.   Dean Clarity, MD 07/12/23 1451

## 2023-07-12 NOTE — Discharge Instructions (Addendum)
 Family Scheduled Appointment Monday 07/14/2023 in the AM  [facility] [address] [place], [place] [address]   [phone]

## 2023-07-12 NOTE — Consult Note (Signed)
 [facility] Psychiatric Consult Follow-up  Patient Name: .Ashley Riddle  MRN: [id number]  DOB: Feb 20, 1946  Consult Order details:  Orders (From admission, onward)     Start     Ordered   07/10/23 1806  CONSULT TO CALL ACT TEAM       Ordering Provider: Jerral Meth, MD  Provider:  (Not yet assigned)  Question:  Reason for Consult?  Answer:  SI/SUD   07/10/23 1805   07/09/23 2137  CONSULT TO CALL ACT TEAM       Ordering Provider: Charlyn Sora, MD  Provider:  (Not yet assigned)  Question:  Reason for Consult?  Answer:  Psych consult   07/09/23 2137             Mode of Visit: In person    Psychiatry Consult Evaluation  Service Date: July 12, 2023 LOS:  LOS: 0 days  Chief Complaint  I've been on oxycodone  for a while and I want to detox to get off of pain meds.   Primary Psychiatric Diagnoses  Opiate abuse MDD Anxiety   Assessment  Ashley Riddle is a 78 y.o. female admitted: Presented to the ED on 07/09/2023  2:06 PM for detox to get off of pain meds.  She carries the psychiatric diagnoses of opiate abuse, major depressive disorder, and anxiety and has a past medical history of rheumatoid arthritis, and COPD, chronic pain.     On evaluation today, the patient is sitting on the side of her bed, appears to be in moderate distress, due to possible withdrawals. Patient is restless as she continues to move from side to side. She is also irritable as she is talking with this provider, and this provider asked her a question, patient answered the day before and patient stated didn't I tell you that yesterday. Patient then becomes tearful, as she says she is sad, she was just visited by her son and when he visited he became tearful and she states  I dont like what I am putting my children through. More frustration and irritability noted today. She also states she has been sweaty, stating she just got out of the shower and had her bed sheets changed, the RN  confirmed. RN also confirmed that patient is having loose stools.  Her appearance is appropriate for environment. Her eye contact is good. Speech is clear and coherent, normal pace and normal volume. She is alert and oriented x4 to person, place, time, and situation, states she is having a rough day and feeling rough. She reports her mood is frustrated due to pain.  Affect is congruent with mood.  Thought process is coherent.  Thought content is within normal limits.  She denies auditory and visual hallucinations.  No indication that she is responding to internal stimuli during this assessment.  No delusions elicited during this assessment. She denies suicidal ideations.  She denies homicidal ideations. Appetite is poor and sleep is fair. Patient does not have any teeth, states she does not like a lot of food here, she also has not been drinking a lot of fluids, she has 3 8-oz cups in her room filed with water. This provider discussed the plan of care with patient, patient is ready to be discharged but states she will follow orders, as she wants help. Discussed with her the possibility of going to an outpatient Medicated Assisted Treatment facility and she agreed, also dicussed the option of starting Suboxone , explaining to the patient that it is a partial  opioid agonist, let her know that it is not meant to control pain, and will help with detox of opioids, also informed her of regular drug testing.  Patient states that she is interested in starting Suboxone . Patient COW scale score as of 1000 am 07/11/23 rated at a 12. Please see plan below for detailed recommendations.    Diagnoses:  Active Hospital problems: Principal Problem:   Opiate abuse, episodic (HCC) Active Problems:   Anxiety and depression    Plan   ## Psychiatric Medication Recommendations:  Continue patient's home medications Start Suboxone  6 mg (2-0.5 mg per SL tablet 2 tablet), will reassess COW score in 2 hours    ## Medical Decision  Making Capacity:  Patient is her own legal guardian   ## Further Work-up:  -- Continue to monitor patient's COW scores EKG or UDS -- most recent EKG on 07/10/23 had QtC of 432 -- Pertinent labwork reviewed earlier this admission includes: CMP, BMP, EKG, UDS     ## Disposition:--Patient is psychiatrically cleared.    ## Behavioral / Environmental: -To minimize splitting of staff, assign one staff person to communicate all information from the team when feasible. or Utilize compassion and acknowledge the patient's experiences while setting clear and realistic expectations for care.                ## Safety and Observation Level:  - Based on my clinical evaluation, I estimate the patient to be at no risk of self harm in the current setting. - At this time, we recommend  routine. This decision is based on my review of the chart including patient's history and current presentation, interview of the patient, mental status examination, and consideration of suicide risk including evaluating suicidal ideation, plan, intent, suicidal or self-harm behaviors, risk factors, and protective factors. This judgment is based on our ability to directly address suicide risk, implement suicide prevention strategies, and develop a safety plan while the patient is in the clinical setting. Please contact our team if there is a concern that risk level has changed.   CSSR Risk Category:C-SSRS RISK CATEGORY: No Risk   Suicide Risk Assessment: Patient has following modifiable risk factors for suicide: none, which we are addressing by continuing to treat patient and monitor her while in the ER as she has been started on Suboxone  induction and today is Day 1. Will reassess in the morning.. Patient has following non-modifiable or demographic risk factors for suicide: none Patient has the following protective factors against suicide: Access to outpatient mental health care, Supportive family, and Cultural, spiritual, or  religious beliefs that discourage suicide   Thank you for this consult request. Recommendations have been communicated to the primary team.  We will continue to treat patient and monitor her while in the ER as she has been started on Suboxone  induction and today is Day 1. Will reassess in the morning and continue to follow patient at this time.   Efrain DELENA Patient, NP       History of Present Illness  Relevant Aspects of Hospital ED Course:  Admitted on 07/09/2023 for I've been on oxycodone  for a while and I want to detox to get off of pain meds.     Patient Report:  Ethelwyn Gilbertson, is seen face to face by this provider, consulted with Dr. Zouev; and chart reviewed on 07/12/23.  On evaluation Ashwika Freels reports she is good and without cravings and pain.    During evaluation Albino Rea Hummer  is laying in bed in no acute distress. She is alert & oriented x 4, calm, cooperative and attentive for this assessment.  Her mood is euthymic with congruent affect.  She has normal speech, and behavior.  Objectively there is no evidence of psychosis/mania or delusional thinking. Pt does not appear to be responding to internal or external stimuli.  Patient is able to converse coherently, goal directed thoughts, no distractibility, or pre-occupation.  She denies suicidal/self-harm/homicidal ideation, psychosis, and paranoia.  Patient answered questions appropriately.    Upon review of PDMP, patient has received her prescriptions appropriately, utilizing one prescriber, Milus Albe.  Her last two prescriptions were belbuca  filled on 06/15/2023 and oxycodone -acetaminophen  10-325 filled on 05/19/2023.  She has no benzodiazepines listed.      Collateral information:  Contacted: Spoke with patient daughter Apolinar and granddaughter Duwaine on 07/11/23, and they stated they are very supportive in patient life and they are willing to do what they need to, to assist. Patient granddaughter is in the  healthcare field and states if she has to administer patients medications she will, she has no problem. Discussed with them starting patient on  Suboxone , explaining to them that it is a partial opioid agonist, let them know that it is not meant to control pain, and will help with detox of opioids, also informed them of patient receiving regular drug testing. They are in agreement that they are ok with patient starting Suboxone  therapy.   Family is provided information for [facility] [address] [place], [place] [address] [phone]   [facility] offers comprehensive outpatient care for opioid addiction, including Medication-Assisted Treatment (MAT) with methadone, buprenorphine , and Suboxone . Services include individual, group, and family counseling, as well as medically supervised withdrawal and regular health monitoring. Patients benefit from take-home medication options, educational resources, and NARCAN  distribution for overdose prevention. Reviews highlight supportive staff, flexible scheduling, and a welcoming environment focused on recovery. This facility is designed to help individuals maintain daily responsibilities while addressing addiction effectively.   @ 1400 07/11/2023 The family made an appointment with [facility] for Monday morning on 07/14/23.     Review of Systems  All other systems reviewed and are negative.    Psychiatric and Social History  Psychiatric History:  Information collected from patient    Prev Dx/Sx: Depression, Opioid abuse Current Psych Provider: None Home Meds (current): see above Previous Med Trials: Denies Therapy: None   Prior Psych Hospitalization: Denies  Prior Self Harm: Denies Prior Violence: Denies   Family Psych History: Denies  Family Hx suicide: Denies   Social History:  Developmental Hx: Patient appears age appropriate  Educational Hx: Patient  graduated high school Occupational Hx: Unemployed  Legal Hx: Denies Living Situation: Lives in apartment with grandson Spiritual Hx: [employer] Access to weapons/lethal means: Denies    Substance History Alcohol : Denies  Type of alcohol  N/A Last Drink N/A Number of drinks per day Denies History of alcohol  withdrawal seizures Denies History of DT's Denies Tobacco: Denies Illicit drugs: Denies Prescription drug abuse: Yes Rehab hx: Yes  Exam Findings  Physical Exam:  Vital Signs:  Temp:  [97.7 F (36.5 C)-98.4 F (36.9 C)] 98.4 F (36.9 C) (02/08 0626) Pulse Rate:  [62-67] 64 (02/08 0626) Resp:  [18-20] 18 (02/08 0626) BP: (105-119)/(62-71) 112/64 (02/08 0626) SpO2:  [96 %-99 %] 96 % (02/08 0626) Blood pressure 112/64, pulse 64, temperature 98.4 F (36.9 C), resp.  rate 18, height 5' 1 (1.549 m), weight 88.5 kg, SpO2 96%. Body mass index is 36.87 kg/m.  Physical Exam Vitals and nursing note reviewed.  Eyes:     Pupils: Pupils are equal, round, and reactive to light.  Pulmonary:     Effort: Pulmonary effort is normal.  Neurological:     Mental Status: She is alert and oriented to person, place, and time.  Psychiatric:        Attention and Perception: Attention and perception normal.        Mood and Affect: Mood and affect normal.        Speech: Speech normal.        Behavior: Behavior normal. Behavior is cooperative.        Thought Content: Thought content normal.        Cognition and Memory: Cognition and memory normal.        Judgment: Judgment normal.     Mental Status Exam: General Appearance: Casual  Orientation:  Full (Time, Place, and Person)  Memory:  Immediate;   Good Remote;   Good  Concentration:  Concentration: Good and Attention Span: Good  Recall:  Good  Attention  Fair  Eye Contact:  Good  Speech:  Clear and Coherent  Language:  Good  Volume:  Normal  Mood: euthymic  Affect:  Congruent  Thought Process:  Coherent  Thought Content:  Logical   Suicidal Thoughts:  No  Homicidal Thoughts:  No  Judgement:  Fair  Insight:  Fair  Psychomotor Activity:  Normal  Akathisia:  No  Fund of Knowledge:  Fair      Assets:  Manufacturing Systems Engineer Desire for Improvement Financial Resources/Insurance Housing Social Support  Cognition:  WNL  ADL's:  Intact  AIMS (if indicated):        Other History   These have been pulled in through the EMR, reviewed, and updated if appropriate.  Family History:  The patient's family history includes Alcohol  abuse in her mother; Breast cancer (age of onset: 76) in her cousin; Breast cancer (age of onset: 38) in her daughter; Heart disease in her mother; Kidney disease in her mother.  Medical History: Past Medical History:  Diagnosis Date   Anxiety    Arthritis    CAP (community acquired pneumonia) 02/18/2017   Constipation    COPD (chronic obstructive pulmonary disease) (HCC)    DDD (degenerative disc disease), cervical    DDD (degenerative disc disease), lumbar    Gout    Heart murmur    probable bicuspid AV with mild AS, mild MR by 04/22/14 Echo (Dr. Andria)   Hepatitis C    treated 2016    History of blood transfusion    Hypertension    Hypomagnesemia 03/02/2017   Pneumonia 12/02/2013   Sepsis (HCC) 04/19/2020    Surgical History: Past Surgical History:  Procedure Laterality Date   ABDOMINAL HYSTERECTOMY     APPENDECTOMY     BACK SURGERY     CESAREAN SECTION     x 3   COLONOSCOPY W/ POLYPECTOMY     HARDWARE REMOVAL Left 12/26/2015   Procedure: REMOVAL K-WIRE LEFT SCAPULA;  Surgeon: Maude LELON Right, MD;  Location: [facility] OR;  Service: Orthopedics;  Laterality: Left;   INNER EAR SURGERY     blood vessel   TONSILLECTOMY     TOTAL SHOULDER ARTHROPLASTY Left 12/27/2014   Procedure: TOTAL SHOULDER ARTHROPLASTY;  Surgeon: Maude LELON Right, MD;  Location: [facility] OR;  Service: Orthopedics;  Laterality:  Left;     Medications:   Current Facility-Administered Medications:     acetaminophen  (TYLENOL ) tablet 500-1,000 mg, 500-1,000 mg, Oral, Q6H PRN, Nanavati, Ankit, MD, 1,000 mg at 07/12/23 1046   albuterol  (PROVENTIL ) (2.5 MG/3ML) 0.083% nebulizer solution 2.5 mg, 2.5 mg, Nebulization, Q4H PRN, Nanavati, Ankit, MD   amLODipine  (NORVASC ) tablet 10 mg, 10 mg, Oral, Daily, Nanavati, Ankit, MD, 10 mg at 07/12/23 1046   aspirin  EC tablet 81 mg, 81 mg, Oral, Daily, Nanavati, Ankit, MD, 81 mg at 07/12/23 1046   benzonatate  (TESSALON ) capsule 200 mg, 200 mg, Oral, TID PRN, Nanavati, Ankit, MD, 200 mg at 07/10/23 9364   buprenorphine -naloxone  (SUBOXONE ) 2-0.5 mg per SL tablet 3 tablet, 3 tablet, Sublingual, Daily, Sylena Lotter A, NP, 3 tablet at 07/12/23 1237   [COMPLETED] cloNIDine  (CATAPRES ) tablet 0.1 mg, 0.1 mg, Oral, QID, 0.1 mg at 07/11/23 1752 **FOLLOWED BY** cloNIDine  (CATAPRES ) tablet 0.1 mg, 0.1 mg, Oral, BID, 0.1 mg at 07/12/23 1047 **FOLLOWED BY** [START ON 07/14/2023] cloNIDine  (CATAPRES ) tablet 0.1 mg, 0.1 mg, Oral, Daily, Nanavati, Ankit, MD   colchicine  tablet 0.6 mg, 0.6 mg, Oral, Daily PRN, Nanavati, Ankit, MD   dicyclomine  (BENTYL ) tablet 20 mg, 20 mg, Oral, Q6H PRN, Nanavati, Ankit, MD, 20 mg at 07/11/23 1326   DULoxetine  (CYMBALTA ) DR capsule 30 mg, 30 mg, Oral, BID, Nanavati, Ankit, MD, 30 mg at 07/12/23 1047   empagliflozin  (JARDIANCE ) tablet 10 mg, 10 mg, Oral, q AM, Nanavati, Ankit, MD, 10 mg at 07/12/23 1047   hydrOXYzine  (ATARAX ) tablet 25 mg, 25 mg, Oral, Q6H PRN, Nanavati, Ankit, MD, 25 mg at 07/11/23 1325   linagliptin  (TRADJENTA ) tablet 5 mg, 5 mg, Oral, Daily, Nanavati, Ankit, MD, 5 mg at 07/12/23 1047   loperamide  (IMODIUM ) capsule 2-4 mg, 2-4 mg, Oral, PRN, Nanavati, Ankit, MD, 4 mg at 07/11/23 1019   losartan  (COZAAR ) tablet 100 mg, 100 mg, Oral, Daily, Nanavati, Ankit, MD, 100 mg at 07/12/23 1046   methocarbamol  (ROBAXIN ) tablet 500 mg, 500 mg, Oral, Q8H PRN, Nanavati, Ankit, MD, 500 mg at 07/11/23 1326   naproxen  (NAPROSYN ) tablet 500 mg, 500 mg,  Oral, BID PRN, Nanavati, Ankit, MD, 500 mg at 07/11/23 1326   ondansetron  (ZOFRAN -ODT) disintegrating tablet 4 mg, 4 mg, Oral, Q6H PRN, Nanavati, Ankit, MD   rosuvastatin  (CRESTOR ) tablet 5 mg, 5 mg, Oral, Daily, Nanavati, Ankit, MD, 5 mg at 07/12/23 1047   sertraline  (ZOLOFT ) tablet 50 mg, 50 mg, Oral, Daily, Nanavati, Ankit, MD, 50 mg at 07/12/23 1047   tiZANidine  (ZANAFLEX ) tablet 4 mg, 4 mg, Oral, QHS, Nanavati, Ankit, MD, 4 mg at 07/11/23 2121   [START ON 07/14/2023] Vitamin D  (Ergocalciferol ) (DRISDOL ) 1.25 MG (50000 UNIT) capsule 50,000 Units, 50,000 Units, Oral, Q Mon, Nanavati, Ankit, MD  Current Outpatient Medications:    albuterol  (VENTOLIN  HFA) 108 (90 Base) MCG/ACT inhaler, Inhale 2 puffs into the lungs every 4 (four) hours as needed for wheezing or shortness of breath., Disp: , Rfl:    amLODipine  (NORVASC ) 10 MG tablet, Take 10 mg by mouth daily., Disp: , Rfl:    aspirin  EC 81 MG tablet, Take 81 mg by mouth daily. Swallow whole., Disp: , Rfl:    BELBUCA  750 MCG FILM, Place 750 mcg inside cheek See admin instructions. Place 750 mcg inside of the cheek at 11 AM and 11 PM, Disp: , Rfl:    benzonatate  (TESSALON ) 200 MG capsule, Take 200 mg by mouth 3 (three) times daily as needed for cough., Disp: , Rfl:  colchicine  0.6 MG tablet, Take 0.6 mg by mouth daily as needed (as directed for gout flares)., Disp: , Rfl:    DULoxetine  (CYMBALTA ) 30 MG capsule, Take 30 mg by mouth 2 (two) times daily., Disp: , Rfl:    ergocalciferol  (VITAMIN D2) 1.25 MG (50000 UT) capsule, Take 50,000 Units by mouth every Monday., Disp: , Rfl:    JANUVIA 50 MG tablet, Take 50 mg by mouth daily., Disp: , Rfl:    JARDIANCE  10 MG TABS tablet, Take 10 mg by mouth in the morning., Disp: , Rfl:    losartan  (COZAAR ) 100 MG tablet, Take 100 mg by mouth daily., Disp: , Rfl:    rosuvastatin  (CRESTOR ) 5 MG tablet, Take 5 mg by mouth daily., Disp: , Rfl:    sertraline  (ZOLOFT ) 50 MG tablet, Take 50 mg by mouth daily., Disp:  , Rfl:    tiZANidine  (ZANAFLEX ) 4 MG tablet, Take 4 mg by mouth at bedtime., Disp: , Rfl:    TYLENOL  500 MG tablet, Take 500-1,000 mg by mouth every 6 (six) hours as needed (for pain)., Disp: , Rfl:   Allergies: Allergies  Allergen Reactions   Gabapentin  Other (See Comments)    Dizziness- States was admitted to hospital     Pregabalin Other (See Comments)    Confusion   Aspirin  Other (See Comments)    Due to history of hepatitis   Codeine Other (See Comments)    Makes the stomach cramp   Penicillins Hives, Swelling and Other (See Comments)    Efrain DELENA Patient, NP

## 2023-07-12 NOTE — Progress Notes (Signed)
 Per provider at this time patient is [facility] cleared.   07/12/2023

## 2023-07-13 DIAGNOSIS — F4323 Adjustment disorder with mixed anxiety and depressed mood: Secondary | ICD-10-CM | POA: Diagnosis not present

## 2023-07-13 NOTE — ED Provider Notes (Signed)
 Emergency Medicine Observation Re-evaluation Note  Ashley Riddle is a 78 y.o. female, seen on rounds today.  Pt initially presented to the ED for complaints of Detox Currently, the patient is calm and cooperative.  Physical Exam  BP (!) 117/59 (BP Location: Left Arm)   Pulse 68   Temp 98.6 F (37 C) (Oral)   Resp 15   Ht 5' 1 (1.549 m)   Wt 88.5 kg   SpO2 97%   BMI 36.87 kg/m  Physical Exam General: Awake. Alert. No acute distress Cardiac: Regular rate rhythm Lungs: Clear to auscultation bilaterally Psych: Calm and cooperative ED Course / MDM  EKG:EKG Interpretation Date/Time:  Thursday July 10 2023 15:19:04 EST Ventricular Rate:  81 PR Interval:  184 QRS Duration:  88 QT Interval:  372 QTC Calculation: 432 R Axis:   15  Text Interpretation: Normal sinus rhythm Nonspecific ST and T wave abnormality Abnormal ECG When compared with ECG of 25-Apr-2022 11:20, PREVIOUS ECG IS PRESENT Artifact Confirmed by Dreama Longs ([id number]) on 07/11/2023 9:59:11 PM  I have reviewed the labs performed to date as well as medications administered while in observation.  Recent changes in the last 24 hours include no acute events.  Plan  Current plan is for continued boarding in the ED awaiting placement at detox facility for opiate dependence, hopefully tomorrow.    Pamella Ozell LABOR, DO 07/13/23 1102

## 2023-07-13 NOTE — ED Notes (Signed)
 Daughter will be here  between 4:30-5 in the morning to pick up her mother    discharge papers just need printing   patients clothing is in her locker

## 2023-07-14 NOTE — ED Notes (Signed)
 Patient's daughter has arrived to transport her to [facility] - both pt and daughter verbalize understanding of discharge instructions. Opportunity for questioning and answers were provided. Armband removed by staff, pt discharged from ED. Ambulated out to lobby

## 2023-07-25 ENCOUNTER — Other Ambulatory Visit: Payer: Self-pay | Admitting: Nurse Practitioner

## 2023-07-25 DIAGNOSIS — K7030 Alcoholic cirrhosis of liver without ascites: Secondary | ICD-10-CM

## 2023-07-25 DIAGNOSIS — K7469 Other cirrhosis of liver: Secondary | ICD-10-CM

## 2023-08-05 ENCOUNTER — Ambulatory Visit
Admission: RE | Admit: 2023-08-05 | Discharge: 2023-08-05 | Disposition: A | Discharge status: Home / Self Care | Payer: 59 | Source: Ambulatory Visit | Attending: Nurse Practitioner

## 2023-08-05 DIAGNOSIS — K7469 Other cirrhosis of liver: Secondary | ICD-10-CM

## 2023-08-05 DIAGNOSIS — K7030 Alcoholic cirrhosis of liver without ascites: Secondary | ICD-10-CM

## 2023-08-05 DIAGNOSIS — K7460 Unspecified cirrhosis of liver: Secondary | ICD-10-CM | POA: Diagnosis not present

## 2023-08-12 DIAGNOSIS — I10 Essential (primary) hypertension: Secondary | ICD-10-CM | POA: Diagnosis not present

## 2023-08-12 DIAGNOSIS — D509 Iron deficiency anemia, unspecified: Secondary | ICD-10-CM | POA: Diagnosis not present

## 2023-08-12 DIAGNOSIS — E559 Vitamin D deficiency, unspecified: Secondary | ICD-10-CM | POA: Diagnosis not present

## 2023-08-12 DIAGNOSIS — E7849 Other hyperlipidemia: Secondary | ICD-10-CM | POA: Diagnosis not present

## 2023-08-12 DIAGNOSIS — D6489 Other specified anemias: Secondary | ICD-10-CM | POA: Diagnosis not present

## 2023-08-18 DIAGNOSIS — E1121 Type 2 diabetes mellitus with diabetic nephropathy: Secondary | ICD-10-CM | POA: Diagnosis not present

## 2023-08-18 DIAGNOSIS — G894 Chronic pain syndrome: Secondary | ICD-10-CM | POA: Diagnosis not present

## 2023-08-18 DIAGNOSIS — I10 Essential (primary) hypertension: Secondary | ICD-10-CM | POA: Diagnosis not present

## 2023-08-18 DIAGNOSIS — E559 Vitamin D deficiency, unspecified: Secondary | ICD-10-CM | POA: Diagnosis not present

## 2023-08-18 DIAGNOSIS — H903 Sensorineural hearing loss, bilateral: Secondary | ICD-10-CM | POA: Diagnosis not present

## 2023-08-18 DIAGNOSIS — Z1211 Encounter for screening for malignant neoplasm of colon: Secondary | ICD-10-CM | POA: Diagnosis not present

## 2023-08-18 DIAGNOSIS — Z789 Other specified health status: Secondary | ICD-10-CM | POA: Diagnosis not present

## 2023-08-18 DIAGNOSIS — E1165 Type 2 diabetes mellitus with hyperglycemia: Secondary | ICD-10-CM | POA: Diagnosis not present

## 2023-08-18 DIAGNOSIS — D509 Iron deficiency anemia, unspecified: Secondary | ICD-10-CM | POA: Diagnosis not present

## 2023-08-18 DIAGNOSIS — D6489 Other specified anemias: Secondary | ICD-10-CM | POA: Diagnosis not present

## 2023-08-20 ENCOUNTER — Encounter (HOSPITAL_COMMUNITY): Payer: Self-pay

## 2023-08-20 ENCOUNTER — Other Ambulatory Visit: Payer: Self-pay

## 2023-08-20 ENCOUNTER — Emergency Department (HOSPITAL_COMMUNITY)

## 2023-08-20 ENCOUNTER — Inpatient Hospital Stay (HOSPITAL_COMMUNITY)
Admission: EM | Admit: 2023-08-20 | Discharge: 2023-08-26 | DRG: 871 | Disposition: A | Discharge status: Home Health | Attending: Internal Medicine | Admitting: Internal Medicine

## 2023-08-20 DIAGNOSIS — R918 Other nonspecific abnormal finding of lung field: Secondary | ICD-10-CM | POA: Diagnosis not present

## 2023-08-20 DIAGNOSIS — Z8249 Family history of ischemic heart disease and other diseases of the circulatory system: Secondary | ICD-10-CM

## 2023-08-20 DIAGNOSIS — J440 Chronic obstructive pulmonary disease with acute lower respiratory infection: Secondary | ICD-10-CM | POA: Diagnosis present

## 2023-08-20 DIAGNOSIS — I517 Cardiomegaly: Secondary | ICD-10-CM | POA: Diagnosis not present

## 2023-08-20 DIAGNOSIS — R062 Wheezing: Secondary | ICD-10-CM | POA: Diagnosis not present

## 2023-08-20 DIAGNOSIS — E119 Type 2 diabetes mellitus without complications: Secondary | ICD-10-CM

## 2023-08-20 DIAGNOSIS — E876 Hypokalemia: Secondary | ICD-10-CM | POA: Diagnosis not present

## 2023-08-20 DIAGNOSIS — E860 Dehydration: Secondary | ICD-10-CM | POA: Diagnosis present

## 2023-08-20 DIAGNOSIS — Z79899 Other long term (current) drug therapy: Secondary | ICD-10-CM | POA: Diagnosis not present

## 2023-08-20 DIAGNOSIS — R6520 Severe sepsis without septic shock: Secondary | ICD-10-CM | POA: Diagnosis present

## 2023-08-20 DIAGNOSIS — Z1152 Encounter for screening for COVID-19: Secondary | ICD-10-CM

## 2023-08-20 DIAGNOSIS — Z96652 Presence of left artificial knee joint: Secondary | ICD-10-CM | POA: Diagnosis not present

## 2023-08-20 DIAGNOSIS — Z90710 Acquired absence of both cervix and uterus: Secondary | ICD-10-CM

## 2023-08-20 DIAGNOSIS — K7460 Unspecified cirrhosis of liver: Secondary | ICD-10-CM | POA: Diagnosis present

## 2023-08-20 DIAGNOSIS — R0602 Shortness of breath: Secondary | ICD-10-CM | POA: Diagnosis not present

## 2023-08-20 DIAGNOSIS — R000 Tachycardia, unspecified: Secondary | ICD-10-CM | POA: Diagnosis not present

## 2023-08-20 DIAGNOSIS — Z96612 Presence of left artificial shoulder joint: Secondary | ICD-10-CM | POA: Diagnosis not present

## 2023-08-20 DIAGNOSIS — A419 Sepsis, unspecified organism: Secondary | ICD-10-CM | POA: Diagnosis not present

## 2023-08-20 DIAGNOSIS — J811 Chronic pulmonary edema: Secondary | ICD-10-CM | POA: Diagnosis not present

## 2023-08-20 DIAGNOSIS — I509 Heart failure, unspecified: Secondary | ICD-10-CM | POA: Diagnosis not present

## 2023-08-20 DIAGNOSIS — I700 Atherosclerosis of aorta: Secondary | ICD-10-CM | POA: Diagnosis not present

## 2023-08-20 DIAGNOSIS — Z885 Allergy status to narcotic agent status: Secondary | ICD-10-CM

## 2023-08-20 DIAGNOSIS — J189 Pneumonia, unspecified organism: Secondary | ICD-10-CM | POA: Diagnosis not present

## 2023-08-20 DIAGNOSIS — R0902 Hypoxemia: Secondary | ICD-10-CM | POA: Diagnosis not present

## 2023-08-20 DIAGNOSIS — I5032 Chronic diastolic (congestive) heart failure: Secondary | ICD-10-CM | POA: Diagnosis not present

## 2023-08-20 DIAGNOSIS — Z87891 Personal history of nicotine dependence: Secondary | ICD-10-CM

## 2023-08-20 DIAGNOSIS — I10 Essential (primary) hypertension: Secondary | ICD-10-CM | POA: Diagnosis present

## 2023-08-20 DIAGNOSIS — Z888 Allergy status to other drugs, medicaments and biological substances status: Secondary | ICD-10-CM

## 2023-08-20 DIAGNOSIS — Z803 Family history of malignant neoplasm of breast: Secondary | ICD-10-CM

## 2023-08-20 DIAGNOSIS — G894 Chronic pain syndrome: Secondary | ICD-10-CM | POA: Diagnosis present

## 2023-08-20 DIAGNOSIS — I110 Hypertensive heart disease with heart failure: Secondary | ICD-10-CM | POA: Diagnosis present

## 2023-08-20 DIAGNOSIS — Z7982 Long term (current) use of aspirin: Secondary | ICD-10-CM | POA: Diagnosis not present

## 2023-08-20 DIAGNOSIS — Z886 Allergy status to analgesic agent status: Secondary | ICD-10-CM

## 2023-08-20 DIAGNOSIS — Z7984 Long term (current) use of oral hypoglycemic drugs: Secondary | ICD-10-CM | POA: Diagnosis not present

## 2023-08-20 DIAGNOSIS — E1151 Type 2 diabetes mellitus with diabetic peripheral angiopathy without gangrene: Secondary | ICD-10-CM | POA: Diagnosis present

## 2023-08-20 DIAGNOSIS — M109 Gout, unspecified: Secondary | ICD-10-CM | POA: Diagnosis present

## 2023-08-20 DIAGNOSIS — Z841 Family history of disorders of kidney and ureter: Secondary | ICD-10-CM

## 2023-08-20 DIAGNOSIS — F1121 Opioid dependence, in remission: Secondary | ICD-10-CM | POA: Diagnosis present

## 2023-08-20 DIAGNOSIS — K7689 Other specified diseases of liver: Secondary | ICD-10-CM | POA: Diagnosis not present

## 2023-08-20 DIAGNOSIS — F419 Anxiety disorder, unspecified: Secondary | ICD-10-CM | POA: Diagnosis present

## 2023-08-20 DIAGNOSIS — R0989 Other specified symptoms and signs involving the circulatory and respiratory systems: Secondary | ICD-10-CM | POA: Diagnosis not present

## 2023-08-20 DIAGNOSIS — Z880 Allergy status to penicillin: Secondary | ICD-10-CM

## 2023-08-20 DIAGNOSIS — Z811 Family history of alcohol abuse and dependence: Secondary | ICD-10-CM

## 2023-08-20 DIAGNOSIS — E872 Acidosis, unspecified: Secondary | ICD-10-CM | POA: Diagnosis present

## 2023-08-20 DIAGNOSIS — F1421 Cocaine dependence, in remission: Secondary | ICD-10-CM | POA: Diagnosis present

## 2023-08-20 LAB — URINALYSIS, W/ REFLEX TO CULTURE (INFECTION SUSPECTED)
Bacteria, UA: NONE SEEN
Bilirubin Urine: NEGATIVE
Glucose, UA: NEGATIVE mg/dL
Ketones, ur: NEGATIVE mg/dL
Leukocytes,Ua: NEGATIVE
Nitrite: NEGATIVE
Protein, ur: NEGATIVE mg/dL
Specific Gravity, Urine: 1.011 (ref 1.005–1.030)
pH: 7 (ref 5.0–8.0)

## 2023-08-20 LAB — COMPREHENSIVE METABOLIC PANEL
ALT: 15 U/L (ref 0–44)
AST: 27 U/L (ref 15–41)
Albumin: 3.7 g/dL (ref 3.5–5.0)
Alkaline Phosphatase: 74 U/L (ref 38–126)
Anion gap: 7 (ref 5–15)
BUN: 11 mg/dL (ref 8–23)
CO2: 25 mmol/L (ref 22–32)
Calcium: 9.4 mg/dL (ref 8.9–10.3)
Chloride: 102 mmol/L (ref 98–111)
Creatinine, Ser: 0.74 mg/dL (ref 0.44–1.00)
GFR, Estimated: >60 mL/min (ref >60)
Glucose, Bld: 176 mg/dL — ABNORMAL HIGH (ref 70–99)
Potassium: 3.8 mmol/L (ref 3.5–5.1)
Sodium: 134 mmol/L — ABNORMAL LOW (ref 135–145)
Total Bilirubin: 0.7 mg/dL (ref 0.0–1.2)
Total Protein: 7.1 g/dL (ref 6.5–8.1)

## 2023-08-20 LAB — I-STAT CG4 LACTIC ACID, ED: Lactic Acid, Venous: 2.7 mmol/L (ref 0.5–1.9)

## 2023-08-20 LAB — CBC WITH DIFFERENTIAL/PLATELET

## 2023-08-20 LAB — APTT: aPTT: 29 s (ref 24–36)

## 2023-08-20 LAB — PROTIME-INR
INR: 1.2 (ref 0.8–1.2)
Prothrombin Time: 15.5 s — ABNORMAL HIGH (ref 11.4–15.2)

## 2023-08-20 MED ORDER — SODIUM CHLORIDE 0.9 % IV SOLN
2.0000 g | Freq: Once | INTRAVENOUS | Status: DC
  Filled 2023-08-20: qty 10

## 2023-08-20 MED ORDER — VANCOMYCIN HCL IN DEXTROSE 1-5 GM/200ML-% IV SOLN
1000.0000 mg | Freq: Once | INTRAVENOUS | Status: AC
  Administered 2023-08-20: 1000 mg via INTRAVENOUS
  Filled 2023-08-20: qty 200

## 2023-08-20 MED ORDER — METRONIDAZOLE 500 MG/100ML IV SOLN
500.0000 mg | Freq: Once | INTRAVENOUS | Status: DC
  Administered 2023-08-21: 500 mg via INTRAVENOUS
  Filled 2023-08-20: qty 100

## 2023-08-20 MED ORDER — LACTATED RINGERS IV SOLN: INTRAVENOUS | Status: DC

## 2023-08-20 MED ORDER — ACETAMINOPHEN 650 MG RE SUPP
650.0000 mg | Freq: Once | RECTAL | Status: AC
  Administered 2023-08-21: 650 mg via RECTAL
  Filled 2023-08-20: qty 1

## 2023-08-20 MED ORDER — LACTATED RINGERS IV BOLUS (SEPSIS): 1000.0000 mL | Freq: Once | INTRAVENOUS | Status: DC

## 2023-08-20 MED ORDER — LACTATED RINGERS IV BOLUS (SEPSIS)
1000.0000 mL | Freq: Once | INTRAVENOUS | Status: AC
  Administered 2023-08-20: 1000 mL via INTRAVENOUS

## 2023-08-20 NOTE — ED Provider Notes (Signed)
 [place] EMERGENCY DEPARTMENT AT [facility] Provider Note   CSN: [id number] Arrival date & time: 08/20/23  2133     History  Chief Complaint  Patient presents with   Fever    Ashley Riddle is a 78 y.o. female.  Patient past medical history significant hepatitis C, hypertension, chronic pain syndrome, is poorly cocaine dependence in remission, heroin dependence in remission, episodic opioid abuse, chronic bronchitis, type II DM, peripheral vascular disease presents to the emergency department complaining of fever.  Family reports that patient was at a friend's house earlier today.  She has chronic pain and states that she was hurting slightly more than usual this morning.  She took a nap at 1 PM and when she woke up she was feeling worse.  They report that she felt febrile.  She reports more abdominal distention than usual with some associated abdominal pain and endorses a new cough.  EMS reported patient was wheezing upon arrival and treated the patient with 5 mg of albuterol.  She also endorses swelling in bilateral legs.  The patient denies chest pain, nausea, vomiting, urinary symptoms.  Upon arrival patient was noted to be febrile with a rectal temperature of 105 Fahrenheit, was tachycardic and tachypneic.  Code sepsis was activated.   Fever      Home Medications Prior to Admission medications   Medication Sig Start Date End Date Taking? Authorizing Provider  hydrALAZINE (APRESOLINE) 50 MG tablet Take 50 mg by mouth 2 (two) times daily. 08/18/23  Yes [provider]  methocarbamol (ROBAXIN) 500 MG tablet Take 500 mg by mouth 2 (two) times daily. 05/14/23  Yes [provider]  albuterol (VENTOLIN HFA) 108 (90 Base) MCG/ACT inhaler Inhale 2 puffs into the lungs every 4 (four) hours as needed for wheezing or shortness of breath.    [provider]  amLODipine (NORVASC) 10 MG tablet Take 10 mg by mouth daily.    [provider]   aspirin EC 81 MG tablet Take 81 mg by mouth daily. Swallow whole.    [provider]  BELBUCA 750 MCG FILM Place 750 mcg inside cheek See admin instructions. Place 750 mcg inside of the cheek at 11 AM and 11 PM    [provider]  benzonatate (TESSALON) 200 MG capsule Take 200 mg by mouth 3 (three) times daily as needed for cough.    [provider]  colchicine 0.6 MG tablet Take 0.6 mg by mouth daily as needed (as directed for gout flares).    [provider]  DULoxetine (CYMBALTA) 30 MG capsule Take 30 mg by mouth 2 (two) times daily.    [provider]  ergocalciferol (VITAMIN D2) 1.25 MG (50000 UT) capsule Take 50,000 Units by mouth every Monday.    [provider]  JANUVIA 50 MG tablet Take 50 mg by mouth daily.    [provider]  JARDIANCE 10 MG TABS tablet Take 10 mg by mouth in the morning.    [provider]  losartan (COZAAR) 100 MG tablet Take 100 mg by mouth daily.    [provider]  rosuvastatin (CRESTOR) 5 MG tablet Take 5 mg by mouth daily.    [provider]  sertraline (ZOLOFT) 50 MG tablet Take 50 mg by mouth daily.    [provider]  tiZANidine (ZANAFLEX) 4 MG tablet Take 4 mg by mouth at bedtime.    [provider]  TYLENOL 500 MG tablet Take 500-1,000 mg  by mouth every 6 (six) hours as needed (for pain).    [provider]      Allergies    Gabapentin, Pregabalin, Aspirin, Codeine, and Penicillins    Review of Systems   Review of Systems  Constitutional:  Positive for fever.    Physical Exam Updated Vital Signs BP 125/61   Pulse 96   Temp 99.1 F (37.3 C)   Resp (!) 23   SpO2 96%  Physical Exam Vitals and nursing note reviewed.  Constitutional:      Appearance: She is well-developed.  HENT:     Head: Normocephalic and atraumatic.  Eyes:     Conjunctiva/sclera: Conjunctivae normal.  Cardiovascular:     Rate and Rhythm: Regular rhythm.  Tachycardia present.     Heart sounds: No murmur heard. Pulmonary:     Effort: Pulmonary effort is normal.     Breath sounds: Rhonchi present.     Comments: Tachypnea Abdominal:     General: There is distension.     Palpations: Abdomen is soft.     Tenderness: There is abdominal tenderness.     Comments: Mild generalized abdominal tenderness.  Abdomen distended.  Musculoskeletal:        General: No swelling.     Cervical back: Neck supple.     Right lower leg: Edema present.     Left lower leg: Edema present.     Comments: Mild bilateral lower extremity edema  Skin:    General: Skin is dry.     Capillary Refill: Capillary refill takes less than 2 seconds.     Comments: Skin hot to touch  Neurological:     Mental Status: She is alert and oriented to person, place, and time.  Psychiatric:        Mood and Affect: Mood normal.     ED Results / Procedures / Treatments   Labs (all labs ordered are listed, but only abnormal results are displayed) Labs Reviewed  COMPREHENSIVE METABOLIC PANEL - Abnormal; Notable for the following components:      Result Value   Sodium 134 (*)    Glucose, Bld 176 (*)    All other components within normal limits  CBC WITH DIFFERENTIAL/PLATELET - Abnormal; Notable for the following components:   WBC 12.0 (*)    RBC 3.34 (*)    Hemoglobin 8.8 (*)    HCT 27.9 (*)    Neutro Abs 11.5 (*)    Lymphs Abs 0.1 (*)    All other components within normal limits  PROTIME-INR - Abnormal; Notable for the following components:   Prothrombin Time 15.5 (*)    All other components within normal limits  URINALYSIS, W/ REFLEX TO CULTURE (INFECTION SUSPECTED) - Abnormal; Notable for the following components:   Hgb urine dipstick SMALL (*)    All other components within normal limits  I-STAT CG4 LACTIC ACID, ED - Abnormal; Notable for the following components:   Lactic Acid, Venous 2.7 (*)    All other components within normal limits  I-STAT CG4 LACTIC ACID, ED -  Abnormal; Notable for the following components:   Lactic Acid, Venous 2.8 (*)    All other components within normal limits  RESP PANEL BY RT-PCR (RSV, FLU A&B, COVID)  RVPGX2  CULTURE, BLOOD (ROUTINE X 2)  CULTURE, BLOOD (ROUTINE X 2)  APTT  CBC WITH DIFFERENTIAL/PLATELET  BASIC METABOLIC PANEL  HEMOGLOBIN A1C    EKG EKG Interpretation Date/Time:  Wednesday August 20 2023 21:36:38 EDT Ventricular Rate:  114 PR Interval:  151 QRS Duration:  84 QT Interval:  354 QTC Calculation: 488 R Axis:   13  Text Interpretation: Sinus tachycardia Borderline T wave abnormalities Borderline prolonged QT interval Confirmed by Tilden Fossa [phone]) on 08/21/2023 1:23:47 AM  Radiology CT Head Wo Contrast Result Date: 08/21/2023 CLINICAL DATA:  Sepsis EXAM: CT HEAD WITHOUT CONTRAST TECHNIQUE: Contiguous axial images were obtained from the base of the skull through the vertex without intravenous contrast. RADIATION DOSE REDUCTION: This exam was performed according to the departmental dose-optimization program which includes automated exposure control, adjustment of the mA and/or kV according to patient size and/or use of iterative reconstruction technique. COMPARISON:  01/24/2016 FINDINGS: Brain: Mild age related volume loss. No acute intracranial abnormality. Specifically, no hemorrhage, hydrocephalus, mass lesion, acute infarction, or significant intracranial injury. Vascular: No hyperdense vessel or unexpected calcification. Skull: No acute calvarial abnormality. Sinuses/Orbits: No acute findings Other: None IMPRESSION: No acute intracranial abnormality. Electronically Signed   By: Charlett Nose M.D.   On: 08/21/2023 00:46   CT CHEST ABDOMEN PELVIS W CONTRAST Result Date: 08/21/2023 CLINICAL DATA:  Sepsis EXAM: CT CHEST, ABDOMEN, AND PELVIS WITH CONTRAST TECHNIQUE: Multidetector CT imaging of the chest, abdomen and pelvis was performed following the standard protocol during bolus administration of  intravenous contrast. RADIATION DOSE REDUCTION: This exam was performed according to the departmental dose-optimization program which includes automated exposure control, adjustment of the mA and/or kV according to patient size and/or use of iterative reconstruction technique. CONTRAST:  75mL OMNIPAQUE IOHEXOL 350 MG/ML SOLN COMPARISON:  04/25/2022 FINDINGS: CT CHEST FINDINGS Cardiovascular: Heart is normal size. Aorta is normal caliber. Scattered coronary artery and aortic atherosclerosis. Mediastinum/Nodes: No mediastinal, hilar, or axillary adenopathy. Trachea and esophagus are unremarkable. Thyroid unremarkable. Lungs/Pleura: Consolidation within the right lower lobe compatible with pneumonia. This is similar to prior study. Calcified granuloma in the inferior right upper lobe. No confluent opacity on the left. Trace right pleural effusion. Musculoskeletal: Chest wall soft tissues are unremarkable. No acute bony abnormality. CT ABDOMEN PELVIS FINDINGS Hepatobiliary: Nodular contours of the liver compatible with cirrhosis. No focal hepatic abnormality. Gallbladder unremarkable. Pancreas: No focal abnormality or ductal dilatation. Spleen: No focal abnormality.  Normal size. Adrenals/Urinary Tract: No adrenal abnormality. No focal renal abnormality. No stones or hydronephrosis. Urinary bladder is unremarkable. Stomach/Bowel: Stomach, large and small bowel grossly unremarkable. Vascular/Lymphatic: Aortic atherosclerosis. No evidence of aneurysm or adenopathy. Reproductive: Prior hysterectomy.  No adnexal masses. Other: No free fluid or free air. Musculoskeletal: No acute bony abnormality. IMPRESSION: Consolidation in the right lower lobe compatible with pneumonia. Scattered coronary artery disease, aortic atherosclerosis. Changes of cirrhosis. No acute findings in the abdomen or pelvis. Electronically Signed   By: Charlett Nose M.D.   On: 08/21/2023 00:46   DG Chest Port 1 View Result Date: 08/20/2023 CLINICAL  DATA:  Questionable sepsis-evaluate for abnormality EXAM: PORTABLE CHEST 1 VIEW COMPARISON:  06/13/2022 FINDINGS: Cardiomegaly. Aortic atherosclerotic calcification. Pulmonary vascular congestion and interstitial coarsening. No focal consolidation, pleural effusion, or pneumothorax. No displaced rib fractures. Left TSA. IMPRESSION: Cardiomegaly with pulmonary vascular congestion and mild edema. Electronically Signed   By: Minerva Fester M.D.   On: 08/20/2023 22:48    Procedures .Critical Care  Performed by: Darrick Grinder, PA-C Authorized by: Pamala Duffel   Critical care provider statement:    Critical care time (minutes):  35   Critical care time was exclusive of:  Separately billable procedures and treating other patients   Critical care was  necessary to treat or prevent imminent or life-threatening deterioration of the following conditions:  Respiratory failure and sepsis   Critical care was time spent personally by me on the following activities:  Development of treatment plan with patient or surrogate, discussions with consultants, evaluation of patient's response to treatment, examination of patient, ordering and review of laboratory studies, ordering and review of radiographic studies, ordering and performing treatments and interventions, pulse oximetry, re-evaluation of patient's condition and review of old charts   Care discussed with: admitting provider       Medications Ordered in ED Medications  lactated ringers infusion (has no administration in time range)  levofloxacin (LEVAQUIN) IVPB 750 mg (has no administration in time range)  ondansetron (ZOFRAN) injection 4 mg (0 mg Intravenous Hold 08/21/23 0142)  0.9 %  sodium chloride infusion (has no administration in time range)  guaiFENesin (ROBITUSSIN) 100 MG/5ML liquid 5 mL (has no administration in time range)  ipratropium-albuterol (DUONEB) 0.5-2.5 (3) MG/3ML nebulizer solution 3 mL (has no administration in time  range)  insulin aspart (novoLOG) injection 0-15 Units (has no administration in time range)  insulin aspart (novoLOG) injection 0-5 Units (has no administration in time range)  metoprolol tartrate (LOPRESSOR) injection 5 mg (has no administration in time range)  acetaminophen (TYLENOL) tablet 650 mg (has no administration in time range)  lactated ringers bolus 1,000 mL (0 mLs Intravenous Stopped 08/21/23 0010)  vancomycin (VANCOCIN) IVPB 1000 mg/200 mL premix (0 mg Intravenous Stopped 08/21/23 0046)  acetaminophen (TYLENOL) suppository 650 mg (650 mg Rectal Given by Other 08/21/23 0042)  iohexol (OMNIPAQUE) 350 MG/ML injection 75 mL (75 mLs Intravenous Contrast Given 08/21/23 0025)  fentaNYL (SUBLIMAZE) injection 50 mcg (50 mcg Intravenous Given 08/21/23 0050)    ED Course/ Medical Decision Making/ A&P                                 Medical Decision Making Amount and/or Complexity of Data Reviewed Labs: ordered. Radiology: ordered.  Risk OTC drugs. Prescription drug management. Decision regarding hospitalization.   This patient presents to the ED for concern of fever, this involves an extensive number of treatment options, and is a complaint that carries with it a high risk of complications and morbidity.  The differential diagnosis includes sepsis, pneumonia, UTI, respiratory infection, others   Co morbidities that complicate the patient evaluation  Substance abuse in remission, hep C, liver cirrhosis, hypertension   Additional history obtained:  Additional history obtained from family at bedside External records from outside source obtained and reviewed including behavioral health notes with patient seen for substance-induced mood disorder   Lab Tests:  I Ordered, and personally interpreted labs.  The pertinent results include: Lactic acid 2.7, WBC 12.0, second lactic acid 2.8; unremarkable UA; negative respiratory panel   Imaging Studies ordered:  I ordered imaging  studies including chest x-ray, CT chest abdomen pelvis with contrast, CT head without contrast I independently visualized and interpreted imaging which showed cardiomegaly with pulmonary vascular congestion with mild edema; I agree with the radiologist interpretation   Cardiac Monitoring: / EKG:  The patient was maintained on a cardiac monitor.  I personally viewed and interpreted the cardiac monitored which showed an underlying rhythm of: sinus tachycardia   Consultations Obtained:  I requested consultation with the hospitalist, Dr.Crosley and discussed lab and imaging findings as well as pertinent plan - they recommend: admission   Problem List / ED Course /  Critical interventions / Medication management   I ordered medication including fentanyl for chronic pain, Vanco, Flagyl for undifferentiated infection, Levaquin for pneumonia due to reported penicillin allergy, lactated Ringer's bolus sepsis protocol, Tylenol for fever Reevaluation of the patient after these medicines showed that the patient improved I have reviewed the patients home medicines and have made adjustments as needed   Social Determinants of Health:  Patient with history of substance abuse   Test / Admission - Considered:  Workup significant for SIRS criteria, Sepsis with elevated lactic acid and likely source of infection pneumonia.  Patient is being treated with antibiotics, fluids.  Patient provided with pain medication for chronic pain.  At this time patient would benefit from admission for continued management of her sepsis         Final Clinical Impression(s) / ED Diagnoses Final diagnoses:  Community acquired pneumonia of right lung, unspecified part of lung  Sepsis, due to unspecified organism, unspecified whether acute organ dysfunction present [employer])    Rx / DC Orders ED Discharge Orders     None         Pamala Duffel 08/21/23 0149    Tilden Fossa, MD 08/21/23  0300

## 2023-08-20 NOTE — Sepsis Progress Note (Signed)
 Elink monitoring for the code sepsis protocol.

## 2023-08-20 NOTE — ED Provider Notes (Incomplete)
 [place] EMERGENCY DEPARTMENT AT [facility] Provider Note   CSN: [id number] Arrival date & time: 08/20/23  2133     History {Add pertinent medical, surgical, social history, OB history to HPI:1} Chief Complaint  Patient presents with  . Fever    Ashley Riddle is a 78 y.o. female.  Patient past medical history significant hepatitis C, hypertension, chronic pain syndrome, is poorly cocaine dependence in remission, heroin dependence in remission, episodic opioid abuse, chronic bronchitis, type II DM, peripheral vascular disease presents to the emergency department complaining of fever.  Family reports that patient was at a friend's house earlier today.  She has chronic pain and states that she was hurting slightly more than usual this morning.  She took a nap at 1 PM and when she woke up she was feeling worse.  They report that she felt febrile.  She reports more abdominal distention than usual with some associated abdominal pain and endorses a new cough.  EMS reported patient was wheezing upon arrival and treated the patient with 5 mg of albuterol.  She also endorses swelling in bilateral legs.  The patient denies chest pain, nausea, vomiting, urinary symptoms.  Upon arrival patient was noted to be febrile with a rectal temperature of 105 Fahrenheit, was tachycardic and tachypneic.  Code sepsis was activated.   Fever      Home Medications Prior to Admission medications   Medication Sig Start Date End Date Taking? Authorizing Provider  albuterol (VENTOLIN HFA) 108 (90 Base) MCG/ACT inhaler Inhale 2 puffs into the lungs every 4 (four) hours as needed for wheezing or shortness of breath.    [provider]  amLODipine (NORVASC) 10 MG tablet Take 10 mg by mouth daily.    [provider]  aspirin EC 81 MG tablet Take 81 mg by mouth daily. Swallow whole.    [provider]  BELBUCA 750 MCG FILM Place 750 mcg inside cheek See admin instructions.  Place 750 mcg inside of the cheek at 11 AM and 11 PM    [provider]  benzonatate (TESSALON) 200 MG capsule Take 200 mg by mouth 3 (three) times daily as needed for cough.    [provider]  colchicine 0.6 MG tablet Take 0.6 mg by mouth daily as needed (as directed for gout flares).    [provider]  DULoxetine (CYMBALTA) 30 MG capsule Take 30 mg by mouth 2 (two) times daily.    [provider]  ergocalciferol (VITAMIN D2) 1.25 MG (50000 UT) capsule Take 50,000 Units by mouth every Monday.    [provider]  JANUVIA 50 MG tablet Take 50 mg by mouth daily.    [provider]  JARDIANCE 10 MG TABS tablet Take 10 mg by mouth in the morning.    [provider]  losartan (COZAAR) 100 MG tablet Take 100 mg by mouth daily.    [provider]  rosuvastatin (CRESTOR) 5 MG tablet Take 5 mg by mouth daily.    [provider]  sertraline (ZOLOFT) 50 MG tablet Take 50 mg by mouth daily.    [provider]  tiZANidine (ZANAFLEX) 4 MG tablet Take 4 mg by mouth at bedtime.    [provider]  TYLENOL 500 MG tablet Take 500-1,000 mg by mouth every 6 (six) hours as needed (for pain).    [provider]      Allergies    Gabapentin, Pregabalin, Aspirin, Codeine, and Penicillins  Review of Systems   Review of Systems  Constitutional:  Positive for fever.    Physical Exam Updated Vital Signs BP (!) 150/69   Pulse (!) 111   Temp (!) 105 F (40.6 C) (Rectal)   Resp (!) 23   SpO2 93%  Physical Exam Vitals and nursing note reviewed.  Constitutional:      Appearance: She is well-developed.  HENT:     Head: Normocephalic and atraumatic.  Eyes:     Conjunctiva/sclera: Conjunctivae normal.  Cardiovascular:     Rate and Rhythm: Regular rhythm. Tachycardia present.     Heart sounds: No murmur heard. Pulmonary:     Effort: Pulmonary effort is normal.     Breath sounds: Normal breath sounds.      Comments: Tachypnea Abdominal:     General: There is distension.     Palpations: Abdomen is soft.     Tenderness: There is abdominal tenderness.     Comments: Mild generalized abdominal tenderness.  Abdomen distended.  Musculoskeletal:        General: No swelling.     Cervical back: Neck supple.     Right lower leg: Edema present.     Left lower leg: Edema present.     Comments: Mild bilateral lower extremity edema  Skin:    General: Skin is dry.     Capillary Refill: Capillary refill takes less than 2 seconds.     Comments: Skin hot to touch  Neurological:     Mental Status: She is alert and oriented to person, place, and time.  Psychiatric:        Mood and Affect: Mood normal.     ED Results / Procedures / Treatments   Labs (all labs ordered are listed, but only abnormal results are displayed) Labs Reviewed  RESP PANEL BY RT-PCR (RSV, FLU A&B, COVID)  RVPGX2  CULTURE, BLOOD (ROUTINE X 2)  CULTURE, BLOOD (ROUTINE X 2)  RESP PANEL BY RT-PCR (RSV, FLU A&B, COVID)  RVPGX2  COMPREHENSIVE METABOLIC PANEL  CBC WITH DIFFERENTIAL/PLATELET  PROTIME-INR  APTT  URINALYSIS, W/ REFLEX TO CULTURE (INFECTION SUSPECTED)  I-STAT CG4 LACTIC ACID, ED    EKG None  Radiology DG Chest Port 1 View Result Date: 08/20/2023 CLINICAL DATA:  Questionable sepsis-evaluate for abnormality EXAM: PORTABLE CHEST 1 VIEW COMPARISON:  06/13/2022 FINDINGS: Cardiomegaly. Aortic atherosclerotic calcification. Pulmonary vascular congestion and interstitial coarsening. No focal consolidation, pleural effusion, or pneumothorax. No displaced rib fractures. Left TSA. IMPRESSION: Cardiomegaly with pulmonary vascular congestion and mild edema. Electronically Signed   By: Minerva Fester M.D.   On: 08/20/2023 22:48    Procedures Procedures  {Document cardiac monitor, telemetry assessment procedure when appropriate:1}  Medications Ordered in ED Medications  lactated ringers infusion (has no administration  in time range)  lactated ringers bolus 1,000 mL (1,000 mLs Intravenous New Bag/Given 08/20/23 2259)  aztreonam (AZACTAM) 2 g in sodium chloride 0.9 % 100 mL IVPB (has no administration in time range)  metroNIDAZOLE (FLAGYL) IVPB 500 mg (has no administration in time range)  vancomycin (VANCOCIN) IVPB 1000 mg/200 mL premix (1,000 mg Intravenous New Bag/Given 08/20/23 2259)    ED Course/ Medical Decision Making/ A&P   {   Click here for ABCD2, HEART and other calculatorsREFRESH Note before signing :1}                              Medical Decision Making Amount and/or Complexity of Data Reviewed  Labs: ordered. Radiology: ordered.  Risk Prescription drug management.   This patient presents to the ED for concern of fever, this involves an extensive number of treatment options, and is a complaint that carries with it a high risk of complications and morbidity.  The differential diagnosis includes sepsis, pneumonia, UTI, respiratory infection, others   Co morbidities that complicate the patient evaluation  Substance abuse in remission, hep C, liver cirrhosis, hypertension   Additional history obtained:  Additional history obtained from family at bedside External records from outside source obtained and reviewed including behavioral health notes with patient seen for substance-induced mood disorder   Lab Tests:  I Ordered, and personally interpreted labs.  The pertinent results include: Lactic acid 2.7, WBC 12.0   Imaging Studies ordered:  I ordered imaging studies including chest x-ray, CT chest abdomen pelvis with contrast, CT head without contrast I independently visualized and interpreted imaging which showed cardiomegaly with pulmonary vascular congestion with mild edema; I agree with the radiologist interpretation   Cardiac Monitoring: / EKG:  The patient was maintained on a cardiac monitor.  I personally viewed and interpreted the cardiac monitored which showed an  underlying rhythm of: ***   Consultations Obtained:  I requested consultation with the ***,  and discussed lab and imaging findings as well as pertinent plan - they recommend: ***   Problem List / ED Course / Critical interventions / Medication management  *** I ordered medication including ***  for ***  Reevaluation of the patient after these medicines showed that the patient {resolved/improved/worsened:23923::"improved"} I have reviewed the patients home medicines and have made adjustments as needed   Social Determinants of Health:  ***   Test / Admission - Considered:  ***   {Document critical care time when appropriate:1} {Document review of labs and clinical decision tools ie heart score, Chads2Vasc2 etc:1}  {Document your independent review of radiology images, and any outside records:1} {Document your discussion with family members, caretakers, and with consultants:1} {Document social determinants of health affecting pt's care:1} {Document your decision making why or why not admission, treatments were needed:1} Final Clinical Impression(s) / ED Diagnoses Final diagnoses:  None    Rx / DC Orders ED Discharge Orders     None

## 2023-08-20 NOTE — ED Triage Notes (Signed)
 Patient BIB [facility] for Shortness of Breath w/ wheezing. 5 mg of albuterol given. Responsible to verbal stimuli. GCS 13 BP 132/88, HR 117, EtcO2 41, RR 19, O2 96 RA.

## 2023-08-21 ENCOUNTER — Emergency Department (HOSPITAL_COMMUNITY)

## 2023-08-21 DIAGNOSIS — F1121 Opioid dependence, in remission: Secondary | ICD-10-CM | POA: Diagnosis present

## 2023-08-21 DIAGNOSIS — I509 Heart failure, unspecified: Secondary | ICD-10-CM | POA: Diagnosis not present

## 2023-08-21 DIAGNOSIS — E860 Dehydration: Secondary | ICD-10-CM | POA: Diagnosis present

## 2023-08-21 DIAGNOSIS — I5032 Chronic diastolic (congestive) heart failure: Secondary | ICD-10-CM | POA: Diagnosis present

## 2023-08-21 DIAGNOSIS — K7460 Unspecified cirrhosis of liver: Secondary | ICD-10-CM | POA: Diagnosis present

## 2023-08-21 DIAGNOSIS — R918 Other nonspecific abnormal finding of lung field: Secondary | ICD-10-CM | POA: Diagnosis not present

## 2023-08-21 DIAGNOSIS — F1421 Cocaine dependence, in remission: Secondary | ICD-10-CM | POA: Diagnosis present

## 2023-08-21 DIAGNOSIS — G894 Chronic pain syndrome: Secondary | ICD-10-CM | POA: Diagnosis present

## 2023-08-21 DIAGNOSIS — Z8249 Family history of ischemic heart disease and other diseases of the circulatory system: Secondary | ICD-10-CM | POA: Diagnosis not present

## 2023-08-21 DIAGNOSIS — R6520 Severe sepsis without septic shock: Secondary | ICD-10-CM | POA: Diagnosis present

## 2023-08-21 DIAGNOSIS — E872 Acidosis, unspecified: Secondary | ICD-10-CM | POA: Diagnosis present

## 2023-08-21 DIAGNOSIS — E1151 Type 2 diabetes mellitus with diabetic peripheral angiopathy without gangrene: Secondary | ICD-10-CM | POA: Diagnosis present

## 2023-08-21 DIAGNOSIS — E876 Hypokalemia: Secondary | ICD-10-CM | POA: Diagnosis present

## 2023-08-21 DIAGNOSIS — Z96652 Presence of left artificial knee joint: Secondary | ICD-10-CM | POA: Diagnosis present

## 2023-08-21 DIAGNOSIS — Z803 Family history of malignant neoplasm of breast: Secondary | ICD-10-CM | POA: Diagnosis not present

## 2023-08-21 DIAGNOSIS — J189 Pneumonia, unspecified organism: Secondary | ICD-10-CM | POA: Diagnosis present

## 2023-08-21 DIAGNOSIS — Z841 Family history of disorders of kidney and ureter: Secondary | ICD-10-CM | POA: Diagnosis not present

## 2023-08-21 DIAGNOSIS — Z79899 Other long term (current) drug therapy: Secondary | ICD-10-CM | POA: Diagnosis not present

## 2023-08-21 DIAGNOSIS — Z87891 Personal history of nicotine dependence: Secondary | ICD-10-CM | POA: Diagnosis not present

## 2023-08-21 DIAGNOSIS — I110 Hypertensive heart disease with heart failure: Secondary | ICD-10-CM | POA: Diagnosis present

## 2023-08-21 DIAGNOSIS — J440 Chronic obstructive pulmonary disease with acute lower respiratory infection: Secondary | ICD-10-CM | POA: Diagnosis present

## 2023-08-21 DIAGNOSIS — K7689 Other specified diseases of liver: Secondary | ICD-10-CM | POA: Diagnosis not present

## 2023-08-21 DIAGNOSIS — Z7982 Long term (current) use of aspirin: Secondary | ICD-10-CM | POA: Diagnosis not present

## 2023-08-21 DIAGNOSIS — A419 Sepsis, unspecified organism: Secondary | ICD-10-CM

## 2023-08-21 DIAGNOSIS — J441 Chronic obstructive pulmonary disease with (acute) exacerbation: Secondary | ICD-10-CM | POA: Diagnosis not present

## 2023-08-21 DIAGNOSIS — Z7984 Long term (current) use of oral hypoglycemic drugs: Secondary | ICD-10-CM | POA: Diagnosis not present

## 2023-08-21 DIAGNOSIS — Z1152 Encounter for screening for COVID-19: Secondary | ICD-10-CM | POA: Diagnosis not present

## 2023-08-21 DIAGNOSIS — Z96612 Presence of left artificial shoulder joint: Secondary | ICD-10-CM | POA: Diagnosis present

## 2023-08-21 LAB — STREP PNEUMONIAE URINARY ANTIGEN: Strep Pneumo Urinary Antigen: NEGATIVE

## 2023-08-21 LAB — CBC WITH DIFFERENTIAL/PLATELET
Abs Immature Granulocytes: 0.1 10*3/uL — ABNORMAL HIGH (ref 0.00–0.07)
Basophils Absolute: 0 10*3/uL (ref 0.0–0.1)
Basophils Relative: 0.1 10*3/uL (ref 0.0–0.1)
Basophils Relative: 0 %
Eosinophils Absolute: 1 10*3/uL (ref 0.0–0.5)
Eosinophils Absolute: 0 10*3/uL (ref 0.0–0.5)
Eosinophils Relative: 0 10*3/uL (ref 0.0–0.5)
Eosinophils Relative: 0 %
HCT: 27.9 % — ABNORMAL LOW (ref 36.0–46.0)
HCT: 27.1 % — ABNORMAL LOW (ref 36.0–46.0)
Hemoglobin: 8.8 g/dL — ABNORMAL LOW (ref 12.0–15.0)
Hemoglobin: 8.4 g/dL — ABNORMAL LOW (ref 12.0–15.0)
Immature Granulocytes: 1 %
Lymphocytes Relative: 1 %
Lymphocytes Relative: 8 %
Lymphs Abs: 0.1 10*3/uL — ABNORMAL LOW (ref 0.7–4.0)
Lymphs Abs: 1 10*3/uL (ref 0.7–4.0)
MCH: 26.3 pg (ref 26.0–34.0)
MCH: 26.3 pg (ref 26.0–34.0)
MCHC: 31.5 g/dL (ref 30.0–36.0)
MCHC: 31 g/dL (ref 30.0–36.0)
MCV: 83.5 fL (ref 80.0–100.0)
MCV: 85 fL (ref 80.0–100.0)
Monocytes Absolute: 0 10*3/uL (ref 0.1–1.0)
Monocytes Absolute: 0.9 10*3/uL (ref 0.1–1.0)
Monocytes Relative: 0.2 10*3/uL (ref 0.1–1.0)
Monocytes Relative: 2 10*3/uL (ref 0.7–4.0)
Monocytes Relative: 7 %
Neutro Abs: 11.5 10*3/uL — ABNORMAL HIGH (ref 1.7–7.7)
Neutro Abs: 10.2 10*3/uL — ABNORMAL HIGH (ref 1.7–7.7)
Neutrophils Relative %: 96 %
Neutrophils Relative %: 84 %
Other: 0 10*3/uL (ref 0.00–0.07)
Platelets: 156 10*3/uL (ref 150–400)
Platelets: 153 10*3/uL (ref 150–400)
RBC: 3.34 MIL/uL — ABNORMAL LOW (ref 3.87–5.11)
RBC: 3.19 MIL/uL — ABNORMAL LOW (ref 3.87–5.11)
RDW: 13.5 % (ref 11.5–15.5)
RDW: 13.7 % (ref 11.5–15.5)
WBC: 12 10*3/uL — ABNORMAL HIGH (ref 4.0–10.5)
WBC: 12.2 10*3/uL — ABNORMAL HIGH (ref 4.0–10.5)
nRBC: 0 % (ref 0.0–0.2)
nRBC: 0 /100{WBCs}
nRBC: 0 % (ref 0.0–0.2)

## 2023-08-21 LAB — CK: Total CK: 102 U/L (ref 38–234)

## 2023-08-21 LAB — GLUCOSE, CAPILLARY
Glucose-Capillary: 108 mg/dL — ABNORMAL HIGH (ref 70–99)
Glucose-Capillary: 112 mg/dL — ABNORMAL HIGH (ref 70–99)
Glucose-Capillary: 127 mg/dL — ABNORMAL HIGH (ref 70–99)
Glucose-Capillary: 169 mg/dL — ABNORMAL HIGH (ref 70–99)

## 2023-08-21 LAB — BASIC METABOLIC PANEL
Anion gap: 7 (ref 5–15)
BUN: 11 mg/dL (ref 8–23)
CO2: 23 mmol/L (ref 22–32)
Calcium: 8.4 mg/dL — ABNORMAL LOW (ref 8.9–10.3)
Chloride: 101 mmol/L (ref 98–111)
Creatinine, Ser: 0.76 mg/dL (ref 0.44–1.00)
GFR, Estimated: >60 mL/min (ref >60)
Glucose, Bld: 174 mg/dL — ABNORMAL HIGH (ref 70–99)
Potassium: 4.4 mmol/L (ref 3.5–5.1)
Sodium: 131 mmol/L — ABNORMAL LOW (ref 135–145)

## 2023-08-21 LAB — TYPE AND SCREEN
ABO/RH(D): O POS
Antibody Screen: NEGATIVE

## 2023-08-21 LAB — RESP PANEL BY RT-PCR (RSV, FLU A&B, COVID)  RVPGX2
Influenza A by PCR: NEGATIVE
Influenza B by PCR: NEGATIVE
Resp Syncytial Virus by PCR: NEGATIVE
SARS Coronavirus 2 by RT PCR: NEGATIVE

## 2023-08-21 LAB — LACTIC ACID, PLASMA
Lactic Acid, Venous: 1.6 mmol/L (ref 0.5–1.9)
Lactic Acid, Venous: 1.2 mmol/L (ref 0.5–1.9)

## 2023-08-21 LAB — I-STAT CG4 LACTIC ACID, ED: Lactic Acid, Venous: 2.8 mmol/L (ref 0.5–1.9)

## 2023-08-21 LAB — CBG MONITORING, ED
Glucose-Capillary: 183 mg/dL — ABNORMAL HIGH (ref 70–99)
Glucose-Capillary: 160 mg/dL — ABNORMAL HIGH (ref 70–99)

## 2023-08-21 LAB — MAGNESIUM: Magnesium: 1.5 mg/dL — ABNORMAL LOW (ref 1.7–2.4)

## 2023-08-21 LAB — HEMOGLOBIN A1C
Hgb A1c MFr Bld: 7.2 % — ABNORMAL HIGH (ref 4.8–5.6)
Mean Plasma Glucose: 159.94 mg/dL

## 2023-08-21 MED ORDER — ACETAMINOPHEN 325 MG PO TABS
650.0000 mg | ORAL_TABLET | Freq: Four times a day (QID) | ORAL | Status: DC | PRN
  Administered 2023-08-21 – 2023-08-24 (×4): 650 mg via ORAL
  Filled 2023-08-21 (×5): qty 2

## 2023-08-21 MED ORDER — SODIUM CHLORIDE 0.9% FLUSH
3.0000 mL | Freq: Two times a day (BID) | INTRAVENOUS | Status: DC
  Administered 2023-08-22 (×2): 10 mL via INTRAVENOUS
  Administered 2023-08-23 – 2023-08-24 (×4): 3 mL via INTRAVENOUS
  Administered 2023-08-25 – 2023-08-26 (×3): 10 mL via INTRAVENOUS

## 2023-08-21 MED ORDER — AZITHROMYCIN 250 MG PO TABS
500.0000 mg | ORAL_TABLET | Freq: Every day | ORAL | Status: AC
  Administered 2023-08-21 – 2023-08-23 (×3): 500 mg via ORAL
  Filled 2023-08-21 (×3): qty 2

## 2023-08-21 MED ORDER — LACTATED RINGERS IV SOLN
INTRAVENOUS | Status: AC
  Administered 2023-08-21: 100 mL via INTRAVENOUS

## 2023-08-21 MED ORDER — ONDANSETRON HCL 4 MG/2ML IJ SOLN: 4.0000 mg | Freq: Once | INTRAMUSCULAR | Status: DC

## 2023-08-21 MED ORDER — INSULIN ASPART 100 UNIT/ML IJ SOLN
0.0000 [IU] | Freq: Every day | INTRAMUSCULAR | Status: DC
  Administered 2023-08-24: 5 [IU] via SUBCUTANEOUS
  Administered 2023-08-25: 2 [IU] via SUBCUTANEOUS

## 2023-08-21 MED ORDER — MORPHINE SULFATE (PF) 2 MG/ML IV SOLN
2.0000 mg | INTRAVENOUS | Status: AC | PRN
  Administered 2023-08-21 (×3): 2 mg via INTRAVENOUS
  Filled 2023-08-21 (×3): qty 1

## 2023-08-21 MED ORDER — TIZANIDINE HCL 2 MG PO TABS
4.0000 mg | ORAL_TABLET | Freq: Every day | ORAL | Status: AC
  Administered 2023-08-21 – 2023-08-23 (×3): 4 mg via ORAL
  Filled 2023-08-21 (×3): qty 2

## 2023-08-21 MED ORDER — SODIUM CHLORIDE 0.9% FLUSH: 3.0000 mL | INTRAVENOUS | Status: DC | PRN

## 2023-08-21 MED ORDER — METOPROLOL TARTRATE 5 MG/5ML IV SOLN: 5.0000 mg | INTRAVENOUS | Status: DC | PRN

## 2023-08-21 MED ORDER — TIZANIDINE HCL 4 MG PO TABS
4.0000 mg | ORAL_TABLET | Freq: Once | ORAL | Status: AC
  Administered 2023-08-21: 4 mg via ORAL
  Filled 2023-08-21: qty 1

## 2023-08-21 MED ORDER — SODIUM CHLORIDE 0.9 % IV SOLN: INTRAVENOUS | Status: DC

## 2023-08-21 MED ORDER — PANTOPRAZOLE SODIUM 40 MG IV SOLR
40.0000 mg | INTRAVENOUS | Status: DC
  Administered 2023-08-21 – 2023-08-22 (×2): 40 mg via INTRAVENOUS
  Filled 2023-08-21 (×2): qty 10

## 2023-08-21 MED ORDER — LEVOFLOXACIN IN D5W 750 MG/150ML IV SOLN
750.0000 mg | Freq: Once | INTRAVENOUS | Status: AC
  Administered 2023-08-21: 750 mg via INTRAVENOUS
  Filled 2023-08-21: qty 150

## 2023-08-21 MED ORDER — IOHEXOL 350 MG/ML SOLN
75.0000 mL | Freq: Once | INTRAVENOUS | Status: AC | PRN
  Administered 2023-08-21: 75 mL via INTRAVENOUS

## 2023-08-21 MED ORDER — GUAIFENESIN 100 MG/5ML PO LIQD
5.0000 mL | ORAL | Status: DC | PRN
  Administered 2023-08-22 – 2023-08-25 (×9): 5 mL via ORAL
  Filled 2023-08-21 (×9): qty 5

## 2023-08-21 MED ORDER — VITAMIN B-12 1000 MCG PO TABS
1000.0000 ug | ORAL_TABLET | ORAL | Status: DC
  Administered 2023-08-25: 1000 ug via ORAL
  Filled 2023-08-21 (×3): qty 1

## 2023-08-21 MED ORDER — INSULIN ASPART 100 UNIT/ML IJ SOLN: 0.0000 [IU] | INTRAMUSCULAR | Status: DC | PRN

## 2023-08-21 MED ORDER — DULOXETINE HCL 30 MG PO CPEP
30.0000 mg | ORAL_CAPSULE | Freq: Two times a day (BID) | ORAL | Status: DC
  Administered 2023-08-21 – 2023-08-26 (×11): 30 mg via ORAL
  Filled 2023-08-21 (×11): qty 1

## 2023-08-21 MED ORDER — SODIUM CHLORIDE 0.9 % IV SOLN
2.0000 g | INTRAVENOUS | Status: DC
  Administered 2023-08-22 – 2023-08-25 (×5): 2 g via INTRAVENOUS
  Filled 2023-08-21 (×5): qty 20

## 2023-08-21 MED ORDER — INSULIN ASPART 100 UNIT/ML IJ SOLN
0.0000 [IU] | Freq: Three times a day (TID) | INTRAMUSCULAR | Status: DC
  Administered 2023-08-21: 3 [IU] via SUBCUTANEOUS
  Administered 2023-08-22 – 2023-08-23 (×3): 2 [IU] via SUBCUTANEOUS
  Administered 2023-08-23 – 2023-08-24 (×2): 3 [IU] via SUBCUTANEOUS
  Administered 2023-08-24: 5 [IU] via SUBCUTANEOUS
  Administered 2023-08-24: 2 [IU] via SUBCUTANEOUS
  Administered 2023-08-25 – 2023-08-26 (×4): 3 [IU] via SUBCUTANEOUS

## 2023-08-21 MED ORDER — SODIUM CHLORIDE 0.9 % IV BOLUS
1000.0000 mL | Freq: Once | INTRAVENOUS | Status: AC
  Administered 2023-08-21: 1000 mL via INTRAVENOUS

## 2023-08-21 MED ORDER — BISACODYL 5 MG PO TBEC: 5.0000 mg | DELAYED_RELEASE_TABLET | Freq: Every day | ORAL | Status: DC | PRN

## 2023-08-21 MED ORDER — SERTRALINE HCL 50 MG PO TABS
50.0000 mg | ORAL_TABLET | Freq: Every day | ORAL | Status: DC
  Administered 2023-08-21 – 2023-08-26 (×6): 50 mg via ORAL
  Filled 2023-08-21 (×6): qty 1

## 2023-08-21 MED ORDER — FENTANYL CITRATE PF 50 MCG/ML IJ SOSY
50.0000 ug | PREFILLED_SYRINGE | Freq: Once | INTRAMUSCULAR | Status: AC
  Administered 2023-08-21: 50 ug via INTRAVENOUS
  Filled 2023-08-21: qty 1

## 2023-08-21 MED ORDER — IPRATROPIUM-ALBUTEROL 0.5-2.5 (3) MG/3ML IN SOLN: 3.0000 mL | RESPIRATORY_TRACT | Status: DC | PRN

## 2023-08-21 NOTE — Evaluation (Signed)
 Clinical/Bedside Swallow Evaluation Patient Details  Name: Ashley Riddle MRN: [id number] Date of Birth: 1946-02-15  Today's Date: 08/21/2023 Time: SLP Start Time (ACUTE ONLY): 1504 SLP Stop Time (ACUTE ONLY): 1514 SLP Time Calculation (min) (ACUTE ONLY): 10 min  Past Medical History:  Past Medical History:  Diagnosis Date   Anxiety    Arthritis    CAP (community acquired pneumonia) 02/18/2017   Constipation    COPD (chronic obstructive pulmonary disease) (HCC)    DDD (degenerative disc disease), cervical    DDD (degenerative disc disease), lumbar    Gout    Heart murmur    probable bicuspid AV with mild AS, mild MR by 04/22/14 Echo (Dr. Shana Chute)   Hepatitis C    treated 2016    History of blood transfusion    Hypertension    Hypomagnesemia 03/02/2017   Pneumonia 12/02/2013   Sepsis (HCC) 04/19/2020   Past Surgical History:  Past Surgical History:  Procedure Laterality Date   ABDOMINAL HYSTERECTOMY     APPENDECTOMY     BACK SURGERY     CESAREAN SECTION     x 3   COLONOSCOPY W/ POLYPECTOMY     HARDWARE REMOVAL Left 12/26/2015   Procedure: REMOVAL K-WIRE LEFT SCAPULA;  Surgeon: Valeria Batman, MD;  Location: [facility] OR;  Service: Orthopedics;  Laterality: Left;   INNER EAR SURGERY     blood vessel   TONSILLECTOMY     TOTAL SHOULDER ARTHROPLASTY Left 12/27/2014   Procedure: TOTAL SHOULDER ARTHROPLASTY;  Surgeon: Valeria Batman, MD;  Location: [facility] OR;  Service: Orthopedics;  Laterality: Left;   HPI:  Ashley Riddle is a 78 yo female presenting with shortness of breath. CT Chest shows RLL consolidation compatible with PNA. Rectal temperate noted to be 105 and code sepsis was initiated. Had RLL PNA as recently as 05/03/22.  OP MBS 06/08/20 WFL. PMH includes hepatitis C, HTN, chronic pain syndrome, cocaine/heroin dependence now in remission, episodic opioid abuse, chronic bronchitis, T2DM, peripheral vascular disease    Assessment / Plan / Recommendation  Clinical  Impression  Pt denies difficulty swallowing or significant signs of esophageal dysphagia, although has been seen on multiple occasions for RLL PNA. Pt states she always dons her dentures for eating, but her daughter took them home so they are not currently available. Despite her edentulism, pt's mastication was prompt and thorough. No s/s of aspiration were observed with thin liquids or during administration of a 3 oz water test. Recommend initiating diet of regular solids with thin liquids but will proceed with an MBS given frequent PNA. SLP Visit Diagnosis: Dysphagia, unspecified (R13.10)    Aspiration Risk  Mild aspiration risk    Diet Recommendation Regular;Thin liquid    Liquid Administration via: Cup;Straw Medication Administration: Whole meds with liquid Supervision: Patient able to self feed Compensations: Minimize environmental distractions;Slow rate;Small sips/bites Postural Changes: Seated upright at 90 degrees    Other  Recommendations Oral Care Recommendations: Oral care BID    Recommendations for follow up therapy are one component of a multi-disciplinary discharge planning process, led by the attending physician.  Recommendations may be updated based on patient status, additional functional criteria and insurance authorization.  Follow up Recommendations Other (comment) (TBA pending MBS)      Assistance Recommended at Discharge    Functional Status Assessment Patient has had a recent decline in their functional status and demonstrates the ability to make significant improvements in function in a reasonable and predictable amount of time.  Frequency and Duration min 2x/week  1 week       Prognosis Prognosis for improved oropharyngeal function: Good Barriers to Reach Goals: Time post onset      Swallow Study   General HPI: Ashley Riddle is a 78 yo female presenting with shortness of breath. CT Chest shows RLL consolidation compatible with PNA. Rectal temperate  noted to be 105 and code sepsis was initiated. Had RLL PNA as recently as 05/03/22.  OP MBS 06/08/20 WFL. PMH includes hepatitis C, HTN, chronic pain syndrome, cocaine/heroin dependence now in remission, episodic opioid abuse, chronic bronchitis, T2DM, peripheral vascular disease Type of Study: Bedside Swallow Evaluation Previous Swallow Assessment: see HPI Diet Prior to this Study: NPO Temperature Spikes Noted: No Respiratory Status: Room air History of Recent Intubation: No Behavior/Cognition: Alert;Cooperative;Pleasant mood Oral Cavity Assessment: Within Functional Limits Oral Care Completed by SLP: No Oral Cavity - Dentition: Edentulous;Dentures, not available Vision: Functional for self-feeding Self-Feeding Abilities: Able to feed self Patient Positioning: Upright in bed Baseline Vocal Quality: Normal Volitional Cough: Strong;Congested Volitional Swallow: Able to elicit    Oral/Motor/Sensory Function Overall Oral Motor/Sensory Function: Within functional limits   Ice Chips Ice chips: Not tested   Thin Liquid Thin Liquid: Within functional limits Presentation: Straw;Self Fed    Nectar Thick Nectar Thick Liquid: Not tested   Honey Thick Honey Thick Liquid: Not tested   Puree Puree: Not tested   Solid     Solid: Within functional limits Presentation: Self Fed      Gwynneth Aliment, M.A., CF-SLP Speech Language Pathology, Acute Rehabilitation Services  Secure Chat preferred [phone]  08/21/2023,3:32 PM

## 2023-08-21 NOTE — Progress Notes (Signed)
 Pharmacy Antibiotic Note  Ashley Riddle is a 78 y.o. female admitted on 08/20/2023 with pneumonia.  Pharmacy has been consulted for levofloxacin dosing due to penicillin allergy. Patient has tolerated cefepime and ceftriaxone in 2023. Will switch to ceftriaxone and azithromycin per consult.   Plan: Ceftriaxone 2g IV q24hr Azithromycin 500mg  x3d     Temp (24hrs), Avg:100.1 F (37.8 C), Min:98.2 F (36.8 C), Max:105 F (40.6 C)  Recent Labs  Lab 08/20/23 2250 08/20/23 2321 08/21/23 0028 08/21/23 0523 08/21/23 1053  WBC 12.0*  --   --  12.2*  --   CREATININE 0.74  --   --  0.76  --   LATICACIDVEN  --  2.7* 2.8*  --  1.6    CrCl cannot be calculated (Unknown ideal weight.).    Allergies  Allergen Reactions   Gabapentin Other (See Comments)    Dizziness- States was admitted to hospital     Pregabalin Other (See Comments)    Confusion   Aspirin Other (See Comments)    Due to history of hepatitis   Codeine Other (See Comments)    Makes the stomach cramp   Penicillins Hives, Swelling and Other (See Comments)    Tolerated ceftriaxone and cefepime many times   Empagliflozin Swelling    Jardiance      Thank you for allowing pharmacy to be a part of this patient's care.  Alphia Moh, PharmD, BCPS, BCCP Clinical Pharmacist  Please check AMION for all [facility] Pharmacy phone numbers After 10:00 PM, call Main Pharmacy [phone]

## 2023-08-21 NOTE — ED Notes (Addendum)
 Per CT  tech pt is spitting up small amount of bright red sputum PA notified

## 2023-08-21 NOTE — H&P (Signed)
 History and Physical    Patient: Ashley Riddle ZOX:[id number] DOB: 1945-07-24 DOA: 08/20/2023 DOS: the patient was seen and examined on 08/21/2023 PCP: [place], MD  Patient coming from: Home Chief complaint: Chief Complaint  Patient presents with   Fever   HPI:  Ashley Riddle is a 78 y.o. female with past medical history  of allergies to [place], pregabalin, aspirin, codeine, penicillin, and empagliflozin, essential hypertension, diabetes mellitus type 2, hepatitis C, sepsis history from pneumonia, history of cocaine, history of liver cirrhosis, COPD, gouty arthropathy, peripheral vascular disease, left total knee replacement comes to [place] today with fevers.  Patient is brought by EMS for shortness of breath and wheezing.  Initial vitals were stable with blood pressure 132/88 respirations of 19 and O2 sats 96% on room air.  Patient was having productive cough.  Patient also noted abdominal distention and discomfort.  Patient reported bilateral swelling.  No reports of chest pain nausea vomiting bleeding hematuria or falls.  >>ED Course Pt in ed is alert awake oriented febrile temperature of 100.9 blood pressure 97/58 as below. >>Vital signs in the ED were notable for the following:  Vitals:   08/21/23 1021 08/21/23 1058 08/21/23 1227 08/21/23 1606  BP:  (!) 113/57  117/66  Pulse:  81  89  Temp: 98.8 F (37.1 C) 98.2 F (36.8 C)  97.8 F (36.6 C)  Resp:  17  19  Height:   5\' 1"  (1.549 m)   SpO2:  97%  100%  TempSrc:  Oral  Oral   >>Labs were notable for the following: CMP showing glucose of 176 sodium 134 otherwise normal kidney function normal LFTs. Lactic acid of 2.7 repeat at 2.8 further repeat pending. CBC showing white count of 12 anemia with hemoglobin of platelet count. A1c of 7.2. Cultures obtained in the emergency room patient met sepsis criteria. Urinalysis shows small blood otherwise normal.   >>EKG: Independently reviewed: Initial EKG showed  sinus tachycardia at 114, PR of 151 and QT of 488.  >>Imaging and additional notable ED work-up:  CT chest abd pelvis show RLL pna. Scattered CAD.No acute findings in the abdomen or pelvis.    >>While in the ED patient received the following: Medications  ondansetron (ZOFRAN) injection 4 mg (0 mg Intravenous Hold 08/21/23 0142)  guaiFENesin (ROBITUSSIN) 100 MG/5ML liquid 5 mL (has no administration in time range)  ipratropium-albuterol (DUONEB) 0.5-2.5 (3) MG/3ML nebulizer solution 3 mL (has no administration in time range)  insulin aspart (novoLOG) injection 0-15 Units (0 Units Subcutaneous Not Given 08/21/23 1633)  insulin aspart (novoLOG) injection 0-5 Units ( Subcutaneous Not Given 08/21/23 0314)  metoprolol tartrate (LOPRESSOR) injection 5 mg (has no administration in time range)  acetaminophen (TYLENOL) tablet 650 mg (650 mg Oral Given 08/21/23 0554)  lactated ringers infusion ( Intravenous Infusion Verify 08/21/23 1645)  bisacodyl (DULCOLAX) EC tablet 5 mg (has no administration in time range)  sodium chloride flush (NS) 0.9 % injection 3-10 mL ( Intravenous Not Given 08/21/23 1030)  sodium chloride flush (NS) 0.9 % injection 3-10 mL (has no administration in time range)  insulin aspart (novoLOG) injection 0-9 Units (has no administration in time range)  cyanocobalamin (VITAMIN B12) tablet 1,000 mcg (1,000 mcg Oral Patient Refused/Not Given 08/21/23 1121)  DULoxetine (CYMBALTA) DR capsule 30 mg (30 mg Oral Given 08/21/23 1118)  sertraline (ZOLOFT) tablet 50 mg (50 mg Oral Given 08/21/23 1118)  morphine (PF) 2 MG/ML injection 2 mg (2 mg Intravenous Given 08/21/23 1548)  cefTRIAXone (ROCEPHIN) 2 g in sodium chloride 0.9 % 100 mL IVPB (has no administration in time range)  azithromycin (ZITHROMAX) tablet 500 mg (500 mg Oral Given 08/21/23 1208)  pantoprazole (PROTONIX) injection 40 mg (has no administration in time range)  lactated ringers bolus 1,000 mL (0 mLs Intravenous Stopped 08/21/23 0010)   vancomycin (VANCOCIN) IVPB 1000 mg/200 mL premix (0 mg Intravenous Stopped 08/21/23 0046)  acetaminophen (TYLENOL) suppository 650 mg (650 mg Rectal Given by Other 08/21/23 0042)  iohexol (OMNIPAQUE) 350 MG/ML injection 75 mL (75 mLs Intravenous Contrast Given 08/21/23 0025)  fentaNYL (SUBLIMAZE) injection 50 mcg (50 mcg Intravenous Given 08/21/23 0050)  levofloxacin (LEVAQUIN) IVPB 750 mg (0 mg Intravenous Stopped 08/21/23 0451)  tiZANidine (ZANAFLEX) tablet 4 mg (4 mg Oral Given 08/21/23 0310)  sodium chloride 0.9 % bolus 1,000 mL (1,000 mLs Intravenous New Bag/Given 08/21/23 1023)   Review of Systems  Respiratory:  Positive for cough, hemoptysis, sputum production, shortness of breath and wheezing.   Cardiovascular:  Positive for leg swelling.  Gastrointestinal:  Positive for abdominal pain.   Past Medical History:  Diagnosis Date   Anxiety    Arthritis    CAP (community acquired pneumonia) 02/18/2017   Constipation    COPD (chronic obstructive pulmonary disease) (HCC)    DDD (degenerative disc disease), cervical    DDD (degenerative disc disease), lumbar    Gout    Heart murmur    probable bicuspid AV with mild AS, mild MR by 04/22/14 Echo (Dr. Shana Chute)   Hepatitis C    treated 2016    History of blood transfusion    Hypertension    Hypomagnesemia 03/02/2017   Pneumonia 12/02/2013   Sepsis (HCC) 04/19/2020   Past Surgical History:  Procedure Laterality Date   ABDOMINAL HYSTERECTOMY     APPENDECTOMY     BACK SURGERY     CESAREAN SECTION     x 3   COLONOSCOPY W/ POLYPECTOMY     HARDWARE REMOVAL Left 12/26/2015   Procedure: REMOVAL K-WIRE LEFT SCAPULA;  Surgeon: Valeria Batman, MD;  Location: [facility] OR;  Service: Orthopedics;  Laterality: Left;   INNER EAR SURGERY     blood vessel   TONSILLECTOMY     TOTAL SHOULDER ARTHROPLASTY Left 12/27/2014   Procedure: TOTAL SHOULDER ARTHROPLASTY;  Surgeon: Valeria Batman, MD;  Location: [facility] OR;  Service: Orthopedics;  Laterality: Left;     reports that she quit smoking about 18 years ago. Her smoking use included cigarettes. She started smoking about 67 years ago. She has a 49 pack-year smoking history. She has never used smokeless tobacco. She reports that she does not currently use alcohol. She reports that she does not use drugs.  Allergies  Allergen Reactions   Gabapentin Other (See Comments)    Dizziness- States was admitted to hospital     Pregabalin Other (See Comments)    Confusion   Aspirin Other (See Comments)    Due to history of hepatitis   Codeine Other (See Comments)    Makes the stomach cramp   Penicillins Hives, Swelling and Other (See Comments)    Tolerated ceftriaxone and cefepime many times   Empagliflozin Swelling    Jardiance    Family History  Problem Relation Age of Onset   Heart disease Mother    Kidney disease Mother    Alcohol abuse Mother    Breast cancer Daughter 70   Breast cancer Cousin 102   BRCA 1/2 Neg Hx  Prior to Admission medications   Medication Sig Start Date End Date Taking? Authorizing Provider  acetaminophen (TYLENOL) 500 MG tablet Take 1,000 mg by mouth as needed for mild pain (pain score 1-3).   Yes [provider]  albuterol (VENTOLIN HFA) 108 (90 Base) MCG/ACT inhaler Inhale 2 puffs into the lungs every 4 (four) hours as needed for wheezing or shortness of breath.   Yes [provider]  amLODipine (NORVASC) 10 MG tablet Take 10 mg by mouth daily.   Yes [provider]  aspirin EC 81 MG tablet Take 81 mg by mouth daily. Swallow whole.   Yes [provider]  colchicine 0.6 MG tablet Take 0.6 mg by mouth daily as needed (as directed for gout flares).   Yes [provider]  cyanocobalamin 1000 MCG tablet Take 1,000 mcg by mouth once a week. Take on Monday   Yes [provider]  DULoxetine (CYMBALTA) 30 MG capsule Take 30 mg by mouth 2 (two) times daily.   Yes [provider]  ergocalciferol (VITAMIN D2)  1.25 MG (50000 UT) capsule Take 50,000 Units by mouth every Monday.   Yes [provider]  ferrous sulfate 324 MG TBEC Take 324 mg by mouth daily.   Yes [provider]  hydrALAZINE (APRESOLINE) 50 MG tablet Take 50 mg by mouth 2 (two) times daily. 08/18/23  Yes [provider]  JANUVIA 50 MG tablet Take 50 mg by mouth daily.   Yes [provider]  losartan (COZAAR) 100 MG tablet Take 100 mg by mouth daily.   Yes [provider]  Multiple Vitamin (MULTIVITAMIN WITH MINERALS) TABS tablet Take 1 tablet by mouth daily. One-A-Day for Women   Yes [provider]  rosuvastatin (CRESTOR) 5 MG tablet Take 5 mg by mouth daily.   Yes [provider]  sertraline (ZOLOFT) 50 MG tablet Take 50 mg by mouth daily.   Yes [provider]  tiZANidine (ZANAFLEX) 4 MG tablet Take 4 mg by mouth at bedtime.   Yes [provider]                                                                                   Vitals:   08/21/23 1021 08/21/23 1058 08/21/23 1227 08/21/23 1606  BP:  (!) 113/57  117/66  Pulse:  81  89  Resp:  17  19  Temp: 98.8 F (37.1 C) 98.2 F (36.8 C)  97.8 F (36.6 C)  TempSrc:  Oral  Oral  SpO2:  97%  100%  Height:   5\' 1"  (1.549 m)    Physical Exam Vitals and nursing note reviewed.  Constitutional:      General: She is not in acute distress. HENT:     Head: Normocephalic and atraumatic.     Right Ear: Hearing normal.     Left Ear: Hearing normal.     Nose: Nose normal. No nasal deformity.     Mouth/Throat:     Lips: Pink.     Tongue: No lesions.     Pharynx: Oropharynx is clear.  Eyes:     General: Lids are normal.     Extraocular  Movements: Extraocular movements intact.  Cardiovascular:     Rate and Rhythm: Normal rate and regular rhythm.     Heart sounds: Normal heart sounds.  Pulmonary:     Effort: Pulmonary effort is normal. No accessory muscle usage, prolonged expiration or respiratory  distress.     Breath sounds: Examination of the right-middle field reveals wheezing. Examination of the left-middle field reveals wheezing. Examination of the right-lower field reveals wheezing. Examination of the left-lower field reveals wheezing. Wheezing present.  Abdominal:     General: Bowel sounds are normal. There is distension.     Palpations: Abdomen is soft. There is no mass.     Tenderness: There is no abdominal tenderness.  Musculoskeletal:     Right lower leg: No edema.     Left lower leg: No edema.  Skin:    General: Skin is warm.  Neurological:     General: No focal deficit present.     Mental Status: She is alert and oriented to person, place, and time.     Cranial Nerves: Cranial nerves 2-12 are intact.  Psychiatric:        Attention and Perception: Attention normal.        Mood and Affect: Mood normal.        Speech: Speech normal.        Behavior: Behavior normal. Behavior is cooperative.      Labs on Admission: I have personally reviewed following labs and imaging studies  CBC: Recent Labs  Lab 08/20/23 2250 08/21/23 0523  WBC 12.0* 12.2*  NEUTROABS 11.5* 10.2*  HGB 8.8* 8.4*  HCT 27.9* 27.1*  MCV 83.5 85.0  PLT 156 153   Basic Metabolic Panel: Recent Labs  Lab 08/20/23 2250 08/21/23 0523  NA 134* 131*  K 3.8 4.4  CL 102 101  CO2 25 23  GLUCOSE 176* 174*  BUN 11 11  CREATININE 0.74 0.76  CALCIUM 9.4 8.4*  MG  --  1.5*   GFR: CrCl cannot be calculated (Unknown ideal weight.). Liver Function Tests: Recent Labs  Lab 08/20/23 2250  AST 27  ALT 15  ALKPHOS 74  BILITOT 0.7  PROT 7.1  ALBUMIN 3.7   No results for input(s): "LIPASE", "AMYLASE" in the last 168 hours. No results for input(s): "AMMONIA" in the last 168 hours. Coagulation Profile: Recent Labs  Lab 08/20/23 2250  INR 1.2   Cardiac Enzymes: Recent Labs  Lab 08/21/23 0523  CKTOTAL 102   BNP (last 3 results) No results for input(s): "PROBNP" in the last 8760  hours. HbA1C: Recent Labs    08/20/23 2250  HGBA1C 7.2*   CBG: Recent Labs  Lab 08/21/23 0314 08/21/23 0803 08/21/23 1145 08/21/23 1540 08/21/23 1728  GLUCAP 183* 160* 108* 112* 127*   Lipid Profile: No results for input(s): "CHOL", "HDL", "LDLCALC", "TRIG", "CHOLHDL", "LDLDIRECT" in the last 72 hours. Thyroid Function Tests: No results for input(s): "TSH", "T4TOTAL", "FREET4", "T3FREE", "THYROIDAB" in the last 72 hours. Anemia Panel: No results for input(s): "VITAMINB12", "FOLATE", "FERRITIN", "TIBC", "IRON", "RETICCTPCT" in the last 72 hours. Urine analysis:    Component Value Date/Time   COLORURINE YELLOW 08/20/2023 2134   APPEARANCEUR CLEAR 08/20/2023 2134   LABSPEC 1.011 08/20/2023 2134   PHURINE 7.0 08/20/2023 2134   GLUCOSEU NEGATIVE 08/20/2023 2134   HGBUR SMALL (A) 08/20/2023 2134   BILIRUBINUR NEGATIVE 08/20/2023 2134   KETONESUR NEGATIVE 08/20/2023 2134   PROTEINUR NEGATIVE 08/20/2023 2134   UROBILINOGEN 0.2 08/20/2014 0554   NITRITE NEGATIVE  08/20/2023 2134   LEUKOCYTESUR NEGATIVE 08/20/2023 2134   Radiological Exams on Admission: CT Head Wo Contrast Result Date: 08/21/2023 CLINICAL DATA:  Sepsis EXAM: CT HEAD WITHOUT CONTRAST TECHNIQUE: Contiguous axial images were obtained from the base of the skull through the vertex without intravenous contrast. RADIATION DOSE REDUCTION: This exam was performed according to the departmental dose-optimization program which includes automated exposure control, adjustment of the mA and/or kV according to patient size and/or use of iterative reconstruction technique. COMPARISON:  01/24/2016 FINDINGS: Brain: Mild age related volume loss. No acute intracranial abnormality. Specifically, no hemorrhage, hydrocephalus, mass lesion, acute infarction, or significant intracranial injury. Vascular: No hyperdense vessel or unexpected calcification. Skull: No acute calvarial abnormality. Sinuses/Orbits: No acute findings Other: None  IMPRESSION: No acute intracranial abnormality. Electronically Signed   By: Charlett Nose M.D.   On: 08/21/2023 00:46   CT CHEST ABDOMEN PELVIS W CONTRAST Result Date: 08/21/2023 CLINICAL DATA:  Sepsis EXAM: CT CHEST, ABDOMEN, AND PELVIS WITH CONTRAST TECHNIQUE: Multidetector CT imaging of the chest, abdomen and pelvis was performed following the standard protocol during bolus administration of intravenous contrast. RADIATION DOSE REDUCTION: This exam was performed according to the departmental dose-optimization program which includes automated exposure control, adjustment of the mA and/or kV according to patient size and/or use of iterative reconstruction technique. CONTRAST:  75mL OMNIPAQUE IOHEXOL 350 MG/ML SOLN COMPARISON:  04/25/2022 FINDINGS: CT CHEST FINDINGS Cardiovascular: Heart is normal size. Aorta is normal caliber. Scattered coronary artery and aortic atherosclerosis. Mediastinum/Nodes: No mediastinal, hilar, or axillary adenopathy. Trachea and esophagus are unremarkable. Thyroid unremarkable. Lungs/Pleura: Consolidation within the right lower lobe compatible with pneumonia. This is similar to prior study. Calcified granuloma in the inferior right upper lobe. No confluent opacity on the left. Trace right pleural effusion. Musculoskeletal: Chest wall soft tissues are unremarkable. No acute bony abnormality. CT ABDOMEN PELVIS FINDINGS Hepatobiliary: Nodular contours of the liver compatible with cirrhosis. No focal hepatic abnormality. Gallbladder unremarkable. Pancreas: No focal abnormality or ductal dilatation. Spleen: No focal abnormality.  Normal size. Adrenals/Urinary Tract: No adrenal abnormality. No focal renal abnormality. No stones or hydronephrosis. Urinary bladder is unremarkable. Stomach/Bowel: Stomach, large and small bowel grossly unremarkable. Vascular/Lymphatic: Aortic atherosclerosis. No evidence of aneurysm or adenopathy. Reproductive: Prior hysterectomy.  No adnexal masses. Other: No  free fluid or free air. Musculoskeletal: No acute bony abnormality. IMPRESSION: Consolidation in the right lower lobe compatible with pneumonia. Scattered coronary artery disease, aortic atherosclerosis. Changes of cirrhosis. No acute findings in the abdomen or pelvis. Electronically Signed   By: Charlett Nose M.D.   On: 08/21/2023 00:46   DG Chest Port 1 View Result Date: 08/20/2023 CLINICAL DATA:  Questionable sepsis-evaluate for abnormality EXAM: PORTABLE CHEST 1 VIEW COMPARISON:  06/13/2022 FINDINGS: Cardiomegaly. Aortic atherosclerotic calcification. Pulmonary vascular congestion and interstitial coarsening. No focal consolidation, pleural effusion, or pneumothorax. No displaced rib fractures. Left TSA. IMPRESSION: Cardiomegaly with pulmonary vascular congestion and mild edema. Electronically Signed   By: Minerva Fester M.D.   On: 08/20/2023 22:48      Data Reviewed: Relevant notes from primary care and specialist visits, past discharge summaries as available in EHR, including Care Everywhere. Prior diagnostic testing as pertinent to current admission diagnoses, Updated medications and problem lists for reconciliation ED course, including vitals, labs, imaging, treatment and response to treatment,Triage notes, nursing and pharmacy notes and ED provider's notes Notable results as noted in HPI.Discussed case with EDMD/ ED APP/ or Specialty MD on call and as needed.  Assessment & Plan   >>  Fever/ Sepsis / RLL PNA: Suspect from aspiration.  Swallow eval. PRN tylenol.  Continue vancomycin and Levaquin with pharmacy consult. Additional supportive measures with MIVF, aspiration precaution fall precaution. No intake/output data recorded. Total I/O In: 677.1 [I.V.:677.1] Out: 500 [Urine:500] Abx was changed per pharmacy as pt has tolerated rocephin in past and azithromycin added.   >> Lactic acidosis: 2/2 to sepsis / dehydration and hypotension. Repeat is ordered /pending.   >>DM  II: Glycemic protocol.  Swallow eval.    >>Essential Htn: Vitals:   08/21/23 0430 08/21/23 0445 08/21/23 0500 08/21/23 0805  BP: (!) 110/57 (!) 100/54 (!) 99/58 (!) 97/58   08/21/23 0815 08/21/23 0900 08/21/23 0930 08/21/23 0945  BP: 100/60 100/61 96/62 128/71   08/21/23 1000 08/21/23 1015 08/21/23 1058 08/21/23 1606  BP: 107/62 110/67 (!) 113/57 117/66  Hold home BP meds. Cont MIVF.    >>Abdominal distension: No ascites noted on ct. Pt does have cirrhosis.    >>Copd: Stable. PRN albuterol.   >>Anemia:    Latest Ref Rng & Units 08/21/2023    5:23 AM 08/20/2023   10:50 PM 07/09/2023    2:25 PM  CBC  WBC 4.0 - 10.5 K/uL 12.2  12.0  7.3   Hemoglobin 12.0 - 15.0 g/dL 8.4  8.8  96.0   Hematocrit 36.0 - 46.0 % 27.1  27.9  38.1   Platelets 150 - 400 K/uL 153  156  222   Type/ screen / IV ppi/ occult.    DVT prophylaxis:  Scd'd  Consults:  None  Advance Care Planning:    Code Status: Full Code   Family Communication:  None  Disposition Plan:  Home  Severity of Illness: The appropriate patient status for this patient is INPATIENT. Inpatient status is judged to be reasonable and necessary in order to provide the required intensity of service to ensure the patient's safety. The patient's presenting symptoms, physical exam findings, and initial radiographic and laboratory data in the context of their chronic comorbidities is felt to place them at high risk for further clinical deterioration. Furthermore, it is not anticipated that the patient will be medically stable for discharge from the hospital within 2 midnights of admission.   * I certify that at the point of admission it is my clinical judgment that the patient will require inpatient hospital care spanning beyond 2 midnights from the point of admission due to high intensity of service, high risk for further deterioration and high frequency of surveillance required.*  Author: Gertha Calkin, MD 08/21/2023 5:45 PM  For on  call review [email or web address].   Unresulted Labs (From admission, onward)     Start     Ordered   08/21/23 1744  Occult blood card to lab, stool RN will collect  Daily,   R     Question:  Specimen to be collected by:  Answer:  RN will collect   08/21/23 1743   08/21/23 1744  Type and screen  Once,   R        08/21/23 1743            Orders Placed This Encounter  Procedures   Critical Care   Resp panel by RT-PCR (RSV, Flu A&B, Covid) Anterior Nasal Swab   Blood Culture (routine x 2)   DG Chest Port 1 View   CT CHEST ABDOMEN PELVIS W CONTRAST   CT Head Wo Contrast   DG Swallowing Func-Speech Pathology   Comprehensive metabolic panel   CBC with  Differential   Protime-INR   APTT   Urinalysis, w/ Reflex to Culture (Infection Suspected) -Urine, Clean Catch   CBC with Differential/Platelet   Basic metabolic panel   Hemoglobin A1c   Lactic acid, plasma   Strep pneumoniae urinary antigen   Magnesium   CK   Glucose, capillary   Glucose, capillary   Glucose, capillary   Occult blood card to lab, stool RN will collect   Diet regular Room service appropriate? Yes with Assist; Fluid consistency: Thin   Document height and weight   Assess and Document Glasgow Coma Scale   Document vital signs within 1-hour of fluid bolus completion. Notify provider of abnormal vital signs despite fluid resuscitation.   DO NOT delay antibiotics if unable to obtain blood culture.   Refer to Sidebar Report: Sepsis Sidebar ED/IP   Notify provider for difficulties obtaining IV access.   Insert peripheral IV x 2   Initiate Carrier Fluid Protocol   Cardiac Monitoring - Continuous Indefinite   Apply Diabetes Mellitus Care Plan   STAT CBG when hypoglycemia is suspected. If treated, recheck every 15 minutes after each treatment until CBG >/= 70 mg/dl   Refer to Hypoglycemia Protocol Sidebar Report for treatment of CBG < 70 mg/dl   Apply Pneumonia Care Plan   Patient has an active order for admit to  inpatient/place in observation   Notify physician (specify)   Mobility Protocol: No Restrictions RN to initiate protocols based on patient's level of care   Initiate Adult Central Line Maintenance and Catheter Protocol for patients with central line (CVC, PICC, Port, Hemodialysis, Trialysis)   If patient diabetic or glucose greater than 140 notify physician for Sliding Scale Insulin Orders   Intake and Output   Initiate Oral Care Protocol   RN may order General Admission PRN Orders utilizing "General Admission PRN medications" (through manage orders) for the following patient needs: allergy symptoms (Claritin), cold sores (Carmex), cough (Robitussin DM), eye irritation (Liquifilm Tears), hemorrhoids (Tucks), indigestion (Maalox), minor skin irritation (Hydrocortisone Cream), muscle pain Romeo Apple Gay), nose irritation (saline nasal spray) and sore throat (Chloraseptic spray).   SCDs   Swallow screen   Apply Diabetes Mellitus Care Plan   No HS correction Insulin   Place and maintain sequential compression device   Full code   Code Sepsis activation.  This occurs automatically when order is signed and prioritizes pharmacy, lab, and radiology services for STAT collections and interventions.  If [facility] downtime, call [facility] [phone]) to activate Code Sepsis.   Consult for [facility] Admission   Consult to respiratory care treatment (RT)   OT eval and treat   PT eval and treat   Pulse oximetry check with vital signs   Oxygen therapy Mode or (Route): Nasal cannula; Liters Per Minute: 2; Keep O2 saturation between: greater than 92 %   Incentive spirometry   SLP eval and treat Reason for evaluation: .Swallowing evaluation (BSE, MBS and/or diet order as indicated)   SLP modified barium swallow   I-Stat Lactic Acid, ED   CBG monitoring, ED   CBG monitoring, ED   EKG 12-Lead   ED EKG   Type and screen   Admit to Inpatient (patient's expected length of stay will be greater than 2 midnights  or inpatient only procedure)   Aspiration precautions   Fall precautions

## 2023-08-21 NOTE — ED Notes (Signed)
 Pt refused breakfast

## 2023-08-22 ENCOUNTER — Inpatient Hospital Stay (HOSPITAL_COMMUNITY)

## 2023-08-22 DIAGNOSIS — A419 Sepsis, unspecified organism: Secondary | ICD-10-CM | POA: Diagnosis not present

## 2023-08-22 DIAGNOSIS — J189 Pneumonia, unspecified organism: Secondary | ICD-10-CM | POA: Diagnosis not present

## 2023-08-22 LAB — GLUCOSE, CAPILLARY
Glucose-Capillary: 133 mg/dL — ABNORMAL HIGH (ref 70–99)
Glucose-Capillary: 109 mg/dL — ABNORMAL HIGH (ref 70–99)
Glucose-Capillary: 113 mg/dL — ABNORMAL HIGH (ref 70–99)
Glucose-Capillary: 125 mg/dL — ABNORMAL HIGH (ref 70–99)
Glucose-Capillary: 128 mg/dL — ABNORMAL HIGH (ref 70–99)
Glucose-Capillary: 117 mg/dL — ABNORMAL HIGH (ref 70–99)
Glucose-Capillary: 140 mg/dL — ABNORMAL HIGH (ref 70–99)

## 2023-08-22 LAB — BRAIN NATRIURETIC PEPTIDE: B Natriuretic Peptide: 308.1 pg/mL — ABNORMAL HIGH (ref 0.0–100.0)

## 2023-08-22 LAB — TSH: TSH: 7.246 u[IU]/mL — ABNORMAL HIGH (ref 0.350–4.500)

## 2023-08-22 MED ORDER — FUROSEMIDE 10 MG/ML IJ SOLN
40.0000 mg | Freq: Two times a day (BID) | INTRAMUSCULAR | Status: DC
  Administered 2023-08-22 – 2023-08-26 (×8): 40 mg via INTRAVENOUS
  Filled 2023-08-22 (×8): qty 4

## 2023-08-22 MED ORDER — KETOROLAC TROMETHAMINE 15 MG/ML IJ SOLN
15.0000 mg | Freq: Four times a day (QID) | INTRAMUSCULAR | Status: DC | PRN
  Administered 2023-08-22 – 2023-08-24 (×9): 15 mg via INTRAVENOUS
  Filled 2023-08-22 (×10): qty 1

## 2023-08-22 NOTE — Plan of Care (Signed)
  Problem: Coping: Goal: Ability to adjust to condition or change in health will improve Outcome: Progressing   Problem: Health Behavior/Discharge Planning: Goal: Ability to identify and utilize available resources and services will improve Outcome: Progressing   Problem: Education: Goal: Knowledge of General Education information will improve Description: Including pain rating scale, medication(s)/side effects and non-pharmacologic comfort measures Outcome: Progressing   Problem: Coping: Goal: Level of anxiety will decrease Outcome: Progressing   Problem: Safety: Goal: Ability to remain free from injury will improve Outcome: Progressing

## 2023-08-22 NOTE — Progress Notes (Signed)
  Progress Note   Patient: Ashley Riddle VHQ:[id number] DOB: [birth year] DOA: 08/20/2023     1 DOS: the patient was seen and examined on 08/22/2023   Brief hospital course: 78 y.o. female with past medical history  of allergies to Penton, pregabalin, aspirin, codeine, penicillin, and empagliflozin, essential hypertension, diabetes mellitus type 2, hepatitis C, sepsis history from pneumonia, history of cocaine, history of liver cirrhosis, COPD, gouty arthropathy, peripheral vascular disease, left total knee replacement comes to [place] today with fevers.  Patient is brought by EMS for shortness of breath and wheezing.  Initial vitals were stable with blood pressure 132/88 respirations of 19 and O2 sats 96% on room air.  Patient was having productive cough.  Patient also noted abdominal distention and discomfort.  Patient reported bilateral swelling.  No reports of chest pain nausea vomiting bleeding hematuria or falls.   Assessment and Plan: Severe sepsis with pneumonia present on admission with lactic acidosis -Presented with fever to 105F with tachycardia, leukocytosis with chest imaging confirming RLL infiltrate -flu, covid, RSV neg -blood cx neg thus far -currently on azithro and rocephin -Wean O2 as tolerated. Currently on [facility]  COPD -On 2LNC. No audible wheezing at this time -cont nebs as needed  Anasarca -diffuse anasarca noted on exam -UA without protein -Will check 2d echo and BNP. Given cocaine hx, will need to r/o cardiomyopathy -will check TSH -Cr normal. Will start 40mg  IV lasix bid -monitor I/o and daily wts  Hx hep C with cirrhosis -CT reviewed, no cirrhosis noted -Follow LFT trends  Diabetes type II -Glycemic trends stable -Cont SSI as needed  Anemia -Hemodynamically stable -Follow CBC trends      Subjective: Still sob but better. Complaining of diffuse swelling  Physical Exam: Vitals:   08/22/23 0815 08/22/23 1012 08/22/23 1201 08/22/23 1639  BP: 118/81  130/80 128/83 (!) 143/95  Pulse: (!) 119 93 95 94  Resp: 18 18 16 18   Temp:  98.9 F (37.2 C) 98.3 F (36.8 C) 98.3 F (36.8 C)  TempSrc:  Oral Oral   SpO2: 98% 95% 97% 98%  Height:       General exam: Awake, laying in bed, in nad Respiratory system: Normal respiratory effort, no wheezing Cardiovascular system: regular rate, s1, s2 Gastrointestinal system: Soft, nondistended, positive BS Central nervous system: CN2-12 grossly intact, strength intact Extremities: Perfused, no clubbing Skin: Normal skin turgor, diffuse anasarca Psychiatry: Mood normal // no visual hallucinations   Data Reviewed:  There are no new results to review at this time.  Family Communication: Pt in room, family not at bedside  Disposition: Status is: Inpatient Remains inpatient appropriate because: severity of illness  Planned Discharge Destination: Home    Author: Rickey Barbara, MD 08/22/2023 5:03 PM  For on call review [email or web address].

## 2023-08-22 NOTE — Evaluation (Signed)
 Physical Therapy Evaluation Patient Details Name: Ashley Riddle MRN: [id number] DOB: [birth year] Today's Date: 08/22/2023  History of Present Illness  78 y.o. female presents to [facility] hospital on 08/20/2023 with fevers, wheezing and SOB. CT concerning for RLL PNA. PMH includes HTN, DMII, hepatitis C, cocaine abuse, liver cirrhosis, COPD, gout, PVD, L TKA.  Clinical Impression  Pt presents to PT with deficits in functional mobility, gait, balance, power, endurance. Pt is limited by pain and reports of fatigue at this time but does ambulate for multiple trials. Pt benefits from UE support of DME when mobilizing to improve stability. PT encourages frequent mobilization with staff assistance. HHPT and a rollator recommended at the time of discharge.        If plan is discharge home, recommend the following: A little help with bathing/dressing/bathroom;Assistance with cooking/housework;Assist for transportation;Help with stairs or ramp for entrance   Can travel by private vehicle        Equipment Recommendations Rollator (4 wheels)  Recommendations for Other Services       Functional Status Assessment Patient has had a recent decline in their functional status and demonstrates the ability to make significant improvements in function in a reasonable and predictable amount of time.     Precautions / Restrictions Precautions Precautions: Fall Recall of Precautions/Restrictions: Intact Precaution/Restrictions Comments: monitor SpO2 Restrictions Weight Bearing Restrictions Per Provider Order: No      Mobility  Bed Mobility Overal bed mobility: Needs Assistance Bed Mobility: Supine to Sit, Sit to Supine     Supine to sit: Supervision Sit to supine: Contact guard assist        Transfers Overall transfer level: Needs assistance Equipment used: Rolling walker (2 wheels), None Transfers: Sit to/from Stand Sit to Stand: Contact guard assist                 Ambulation/Gait Ambulation/Gait assistance: Min assist Gait Distance (Feet): 150 Feet (initial trials of 20' x 2 without DME) Assistive device: Rolling walker (2 wheels), None Gait Pattern/deviations: Step-through pattern, Wide base of support Gait velocity: reduced Gait velocity interpretation: <1.8 ft/sec, indicate of risk for recurrent falls   General Gait Details: pt with slowed step-through gait, increased lateral and AP sway without UE support. Improved stability with UE support of RW  Stairs            Wheelchair Mobility     Tilt Bed    Modified Rankin (Stroke Patients Only)       Balance Overall balance assessment: Needs assistance Sitting-balance support: No upper extremity supported, Feet supported Sitting balance-Leahy Scale: Good     Standing balance support: Bilateral upper extremity supported, Reliant on assistive device for balance Standing balance-Leahy Scale: Poor                               Pertinent Vitals/Pain Pain Assessment Pain Assessment: Faces Faces Pain Scale: Hurts little more Pain Location: generalized Pain Descriptors / Indicators: Sore Pain Intervention(s): Monitored during session    Home Living Family/patient expects to be discharged to:: Private residence Living Arrangements: Children (plans to d/c to daughters home) Available Help at Discharge: Family;Available PRN/intermittently Type of Home: House         Home Layout: One level Home Equipment: None      Prior Function Prior Level of Function : Independent/Modified Independent;Driving             Mobility Comments: ambulatory without DME  Extremity/Trunk Assessment   Upper Extremity Assessment Upper Extremity Assessment: RUE deficits/detail;LUE deficits/detail RUE Deficits / Details: ROM and strength WFL, edema noted in BUE LUE Deficits / Details: ROM and strength WFL, edema noted in BUE    Lower Extremity Assessment Lower  Extremity Assessment: Generalized weakness (along with edema)    Cervical / Trunk Assessment Cervical / Trunk Assessment: Normal  Communication   Communication Communication: No apparent difficulties    Cognition Arousal: Alert Behavior During Therapy: WFL for tasks assessed/performed   PT - Cognitive impairments: No apparent impairments                         Following commands: Intact       Cueing Cueing Techniques: Verbal cues     General Comments General comments (skin integrity, edema, etc.): pt on 2LNC initially with sats in high 80s. PT increases O2 to 3L for mobility with sats in high 80s when mobilizing, 93% at rest.    Exercises     Assessment/Plan    PT Assessment Patient needs continued PT services  PT Problem List Decreased strength;Decreased activity tolerance;Decreased balance;Decreased mobility;Decreased knowledge of use of DME;Pain       PT Treatment Interventions DME instruction;Stair training;Gait training;Functional mobility training;Therapeutic activities;Therapeutic exercise;Balance training;Neuromuscular re-education;Patient/family education    PT Goals (Current goals can be found in the Care Plan section)  Acute Rehab PT Goals Patient Stated Goal: to return to independence PT Goal Formulation: With patient/family Time For Goal Achievement: 09/05/23 Potential to Achieve Goals: Fair Additional Goals Additional Goal #1: Pt will score >19/24 on the DGI to indicate a reduced risk for falls    Frequency Min 2X/week     Co-evaluation               AM-PAC PT "6 Clicks" Mobility  Outcome Measure Help needed turning from your back to your side while in a flat bed without using bedrails?: A Little Help needed moving from lying on your back to sitting on the side of a flat bed without using bedrails?: A Little Help needed moving to and from a bed to a chair (including a wheelchair)?: A Little Help needed standing up from a chair  using your arms (e.g., wheelchair or bedside chair)?: A Little Help needed to walk in hospital room?: A Little Help needed climbing 3-5 steps with a railing? : A Lot 6 Click Score: 17    End of Session Equipment Utilized During Treatment: Oxygen Activity Tolerance: Patient tolerated treatment well Patient left: in bed;with call bell/phone within reach;with family/visitor present Nurse Communication: Mobility status PT Visit Diagnosis: Other abnormalities of gait and mobility (R26.89);Muscle weakness (generalized) (M62.81)    Time: 1610-9604 PT Time Calculation (min) (ACUTE ONLY): 37 min   Charges:   PT Evaluation $PT Eval Low Complexity: 1 Low   PT General Charges $$ ACUTE PT VISIT: 1 Visit         Arlyss Gandy, PT, DPT Acute Rehabilitation Office [phone]   Arlyss Gandy 08/22/2023, 11:26 AM

## 2023-08-22 NOTE — Progress Notes (Addendum)
 NT took the patient's vitals while she was at rest but patient was experiencing severe pain at a level of 9 out of 10.  She became a yellow MEWS at this time, due to her HR of 118, she was asymptomatic.  Administered pain medication and when her pain was relieved I rechecked her vitals and she was now back at a green MEWS.  I notified Rickey Barbara MD.  He instructed me to keep her vitals sign checks every 4 hours.

## 2023-08-22 NOTE — Hospital Course (Signed)
 78 y.o. female with past medical history  of allergies to Penton, pregabalin, aspirin, codeine, penicillin, and empagliflozin, essential hypertension, diabetes mellitus type 2, hepatitis C, sepsis history from pneumonia, history of cocaine, history of liver cirrhosis, COPD, gouty arthropathy, peripheral vascular disease, left total knee replacement comes to [place] today with fevers.  Patient is brought by EMS for shortness of breath and wheezing.  Initial vitals were stable with blood pressure 132/88 respirations of 19 and O2 sats 96% on room air.  Patient was having productive cough.  Patient also noted abdominal distention and discomfort.  Patient reported bilateral swelling.  No reports of chest pain nausea vomiting bleeding hematuria or falls.

## 2023-08-22 NOTE — Evaluation (Signed)
 Modified Barium Swallow Study  Patient Details  Name: Ashley Riddle MRN: [id number] Date of Birth: 1946/05/24  Today's Date: 08/22/2023  Modified Barium Swallow completed.  Full report located under Chart Review in the Imaging Section.  History of Present Illness Ashley Riddle is a 78 yo female presenting with shortness of breath. CT Chest shows RLL consolidation compatible with PNA. Rectal temperate noted to be 105 and code sepsis was initiated. Had RLL PNA as recently as 05/03/22.  OP MBS 06/08/20 WFL. PMH includes hepatitis C, HTN, chronic pain syndrome, cocaine/heroin dependence now in remission, episodic opioid abuse, chronic bronchitis, T2DM, peripheral vascular disease   Clinical Impression Pt presents with normal oropharyngeal swallow function - there was prolonged mastication due to absence of teeth, but adequate propulsion into pharynx; reliable laryngeal vestibule closure with no penetration/aspiration; adequate pharyngeal clearance/no residue.  An esophageal sweep revealed clearance of 13 mm barium pill and solid barium consistencies.  Doubt dysphagia-related aspiration as contributor to recurring pna. Continue regular solids, thin liquids; pills may be taken whole in water. No SLP f/u is needed - our service will sign off. Factors that may increase risk of adverse event in presence of aspiration [name] & Clearance [name] 2021):    Swallow Evaluation Recommendations Recommendations: PO diet PO Diet Recommendation: Regular;Thin liquids (Level 0) Liquid Administration via: Cup;Straw Medication Administration: Whole meds with liquid Supervision: Patient able to self-feed Oral care recommendations: Oral care BID (2x/day)     [name] L. Samson Frederic, MA CCC/SLP Clinical Specialist - Acute Care SLP Acute Rehabilitation Services Office number [phone]  Blenda Mounts Laurice 08/22/2023,9:35 AM

## 2023-08-23 ENCOUNTER — Inpatient Hospital Stay (HOSPITAL_COMMUNITY)

## 2023-08-23 DIAGNOSIS — A419 Sepsis, unspecified organism: Secondary | ICD-10-CM | POA: Diagnosis not present

## 2023-08-23 DIAGNOSIS — I509 Heart failure, unspecified: Secondary | ICD-10-CM

## 2023-08-23 DIAGNOSIS — J189 Pneumonia, unspecified organism: Secondary | ICD-10-CM | POA: Diagnosis not present

## 2023-08-23 LAB — ECHOCARDIOGRAM COMPLETE
AR max vel: 1.33 cm2
AV Area VTI: 1.36 cm2
AV Area mean vel: 1.21 cm2
AV Mean grad: 24 mmHg
AV Peak grad: 41.5 mmHg
Ao pk vel: 3.22 m/s
Area-P 1/2: 3.76 cm2
Height: 61 in
MV VTI: 2.76 cm2
S' Lateral: 3.2 cm

## 2023-08-23 LAB — COMPREHENSIVE METABOLIC PANEL
ALT: 12 U/L (ref 0–44)
AST: 16 U/L (ref 15–41)
Albumin: 2.9 g/dL — ABNORMAL LOW (ref 3.5–5.0)
Alkaline Phosphatase: 62 U/L (ref 38–126)
Anion gap: 11 (ref 5–15)
BUN: 7 mg/dL — ABNORMAL LOW (ref 8–23)
CO2: 25 mmol/L (ref 22–32)
Calcium: 9.3 mg/dL (ref 8.9–10.3)
Chloride: 101 mmol/L (ref 98–111)
Creatinine, Ser: 0.59 mg/dL (ref 0.44–1.00)
GFR, Estimated: >60 mL/min (ref >60)
Glucose, Bld: 124 mg/dL — ABNORMAL HIGH (ref 70–99)
Potassium: 3.6 mmol/L (ref 3.5–5.1)
Sodium: 137 mmol/L (ref 135–145)
Total Bilirubin: 0.6 mg/dL (ref 0.0–1.2)
Total Protein: 6.6 g/dL (ref 6.5–8.1)

## 2023-08-23 LAB — GLUCOSE, CAPILLARY
Glucose-Capillary: 148 mg/dL — ABNORMAL HIGH (ref 70–99)
Glucose-Capillary: 179 mg/dL — ABNORMAL HIGH (ref 70–99)
Glucose-Capillary: 118 mg/dL — ABNORMAL HIGH (ref 70–99)
Glucose-Capillary: 160 mg/dL — ABNORMAL HIGH (ref 70–99)

## 2023-08-23 LAB — CBC
HCT: 26.6 % — ABNORMAL LOW (ref 36.0–46.0)
Hemoglobin: 8.4 g/dL — ABNORMAL LOW (ref 12.0–15.0)
MCH: 26.1 pg (ref 26.0–34.0)
MCHC: 31.6 g/dL (ref 30.0–36.0)
MCV: 82.6 fL (ref 80.0–100.0)
Platelets: 151 10*3/uL (ref 150–400)
RBC: 3.22 MIL/uL — ABNORMAL LOW (ref 3.87–5.11)
RDW: 13.4 % (ref 11.5–15.5)
WBC: 8.5 10*3/uL (ref 4.0–10.5)
nRBC: 0 % (ref 0.0–0.2)

## 2023-08-23 LAB — TROPONIN I (HIGH SENSITIVITY)
Troponin I (High Sensitivity): 8 ng/L (ref <18)
Troponin I (High Sensitivity): 7 ng/L (ref <18)

## 2023-08-23 MED ORDER — BENZONATATE 100 MG PO CAPS
100.0000 mg | ORAL_CAPSULE | Freq: Two times a day (BID) | ORAL | Status: DC
  Administered 2023-08-23 – 2023-08-26 (×6): 100 mg via ORAL
  Filled 2023-08-23 (×6): qty 1

## 2023-08-23 MED ORDER — BENZONATATE 100 MG PO CAPS: 100.0000 mg | ORAL_CAPSULE | Freq: Three times a day (TID) | ORAL | Status: DC

## 2023-08-23 NOTE — Plan of Care (Signed)
  Problem: Education: Goal: Ability to describe self-care measures that may prevent or decrease complications (Diabetes Survival Skills Education) will improve Outcome: Progressing Goal: Individualized Educational Video(s) Outcome: Progressing   Problem: Coping: Goal: Ability to adjust to condition or change in health will improve Outcome: Progressing   Problem: Fluid Volume: Goal: Ability to maintain a balanced intake and output will improve Outcome: Progressing   Problem: Health Behavior/Discharge Planning: Goal: Ability to identify and utilize available resources and services will improve Outcome: Progressing Goal: Ability to manage health-related needs will improve Outcome: Progressing   Problem: Metabolic: Goal: Ability to maintain appropriate glucose levels will improve Outcome: Progressing   Problem: Nutritional: Goal: Maintenance of adequate nutrition will improve Outcome: Progressing Goal: Progress toward achieving an optimal weight will improve Outcome: Progressing   Problem: Skin Integrity: Goal: Risk for impaired skin integrity will decrease Outcome: Progressing   Problem: Tissue Perfusion: Goal: Adequacy of tissue perfusion will improve Outcome: Progressing   Problem: Education: Goal: Knowledge of General Education information will improve Description: Including pain rating scale, medication(s)/side effects and non-pharmacologic comfort measures Outcome: Progressing   Problem: Health Behavior/Discharge Planning: Goal: Ability to manage health-related needs will improve Outcome: Progressing   Problem: Clinical Measurements: Goal: Ability to maintain clinical measurements within normal limits will improve Outcome: Progressing Goal: Will remain free from infection Outcome: Progressing Goal: Diagnostic test results will improve Outcome: Progressing Goal: Respiratory complications will improve Outcome: Progressing Goal: Cardiovascular complication will  be avoided Outcome: Progressing   Problem: Nutrition: Goal: Adequate nutrition will be maintained Outcome: Progressing   Problem: Activity: Goal: Risk for activity intolerance will decrease Outcome: Progressing   Problem: Coping: Goal: Level of anxiety will decrease Outcome: Progressing   Problem: Elimination: Goal: Will not experience complications related to bowel motility Outcome: Progressing Goal: Will not experience complications related to urinary retention Outcome: Progressing   Problem: Pain Managment: Goal: General experience of comfort will improve and/or be controlled Outcome: Progressing   Problem: Safety: Goal: Ability to remain free from injury will improve Outcome: Progressing   Problem: Skin Integrity: Goal: Risk for impaired skin integrity will decrease Outcome: Progressing   Problem: Activity: Goal: Ability to tolerate increased activity will improve Outcome: Progressing   Problem: Clinical Measurements: Goal: Ability to maintain a body temperature in the normal range will improve Outcome: Progressing   Problem: Respiratory: Goal: Ability to maintain adequate ventilation will improve Outcome: Progressing Goal: Ability to maintain a clear airway will improve Outcome: Progressing

## 2023-08-23 NOTE — Plan of Care (Signed)

## 2023-08-23 NOTE — Evaluation (Signed)
 Occupational Therapy Evaluation Patient Details Name: Ashley Riddle MRN: [id number] DOB: 07/07/1945 Today's Date: 08/23/2023   History of Present Illness   78 y.o. female presents to [facility] hospital on 08/20/2023 with fevers, wheezing and SOB. CT concerning for RLL PNA. PMH includes HTN, DMII, hepatitis C, cocaine abuse, liver cirrhosis, COPD, gout, PVD, L TKA.     Clinical Impressions Pt admitted with the above diagnoses and presents with below problem list. Pt will benefit from continued acute OT to address the below listed deficits and maximize independence with basic ADLs prior to d/c. At baseline, pt is independent with ADLs. Pt currently needs up to contact guard assist with LB/OOB ADLs.      If plan is discharge home, recommend the following:   A little help with bathing/dressing/bathroom;A little help with walking and/or transfers     Functional Status Assessment   Patient has had a recent decline in their functional status and demonstrates the ability to make significant improvements in function in a reasonable and predictable amount of time.     Equipment Recommendations   None recommended by OT     Recommendations for Other Services         Precautions/Restrictions   Precautions Precautions: Fall Recall of Precautions/Restrictions: Intact Restrictions Weight Bearing Restrictions Per Provider Order: No     Mobility Bed Mobility Overal bed mobility: Needs Assistance Bed Mobility: Supine to Sit     Supine to sit: Supervision          Transfers Overall transfer level: Needs assistance Equipment used: None Transfers: Sit to/from Stand Sit to Stand: Contact guard assist           General transfer comment: pt declined walker to mobilize in the room, intermittent single UE support sought by pt.      Balance Overall balance assessment: Needs assistance Sitting-balance support: No upper extremity supported, Feet supported Sitting  balance-Leahy Scale: Good     Standing balance support: No upper extremity supported, Single extremity supported, During functional activity, Bilateral upper extremity supported Standing balance-Leahy Scale: Poor Standing balance comment: leaning on sink for support to complete grooming tasks in standing position                           ADL either performed or assessed with clinical judgement   ADL Overall ADL's : Needs assistance/impaired Eating/Feeding: Set up;Sitting   Grooming: Contact guard assist;Standing   Upper Body Bathing: Set up;Sitting   Lower Body Bathing: Contact guard assist;Sit to/from stand   Upper Body Dressing : Set up;Sitting   Lower Body Dressing: Contact guard assist;Sit to/from stand   Toilet Transfer: Contact guard assist;Ambulation   Toileting- Clothing Manipulation and Hygiene: Contact guard assist   Tub/ Shower Transfer: Contact guard assist   Functional mobility during ADLs: Contact guard assist General ADL Comments: Pt walked to toilet then stood at sink to wash hands. Seeks intermittent external support, declines walker.     Vision         Perception         Praxis         Pertinent Vitals/Pain Pain Assessment Pain Assessment: Faces Faces Pain Scale: Hurts little more Pain Location: generalized Pain Descriptors / Indicators: Discomfort Pain Intervention(s): Monitored during session     Extremity/Trunk Assessment Upper Extremity Assessment Upper Extremity Assessment: Generalized weakness   Lower Extremity Assessment Lower Extremity Assessment: Defer to PT evaluation       Communication  Communication Communication: No apparent difficulties   Cognition Arousal: Alert Behavior During Therapy: WFL for tasks assessed/performed Cognition: No apparent impairments                               Following commands: Intact       Cueing  General Comments   Cueing Techniques: Verbal cues  Pt on 1L  at start of session, moved to RA for OOB activity.   Exercises     Shoulder Instructions      Home Living Family/patient expects to be discharged to:: Private residence Living Arrangements: Children (daughter) Available Help at Discharge: Family;Available PRN/intermittently Type of Home: House       Home Layout: One level               Home Equipment: None   Additional Comments: Pt lives with great-grandson but plans to d/c to daughter's house initially      Prior Functioning/Environment Prior Level of Function : Independent/Modified Independent;Driving             Mobility Comments: ambulatory without DME      OT Problem List: Decreased strength;Decreased activity tolerance;Impaired balance (sitting and/or standing);Decreased knowledge of use of DME or AE;Decreased knowledge of precautions;Pain   OT Treatment/Interventions: Self-care/ADL training;Therapeutic exercise;Energy conservation;DME and/or AE instruction;Therapeutic activities;Patient/family education;Balance training      OT Goals(Current goals can be found in the care plan section)   Acute Rehab OT Goals Patient Stated Goal: eager to take a shower OT Goal Formulation: With patient/family Time For Goal Achievement: 09/06/23 Potential to Achieve Goals: Good ADL Goals Pt Will Perform Grooming: with modified independence;standing Pt Will Perform Lower Body Bathing: with modified independence;sit to/from stand Pt Will Perform Lower Body Dressing: with modified independence;sit to/from stand Pt Will Transfer to Toilet: with modified independence;ambulating Pt Will Perform Toileting - Clothing Manipulation and hygiene: with modified independence;sit to/from stand   OT Frequency:  Min 2X/week    Co-evaluation              AM-PAC OT "6 Clicks" Daily Activity     Outcome Measure Help from another person eating meals?: None Help from another person taking care of personal grooming?: A  Little Help from another person toileting, which includes using toliet, bedpan, or urinal?: A Little Help from another person bathing (including washing, rinsing, drying)?: A Little Help from another person to put on and taking off regular upper body clothing?: None Help from another person to put on and taking off regular lower body clothing?: A Little 6 Click Score: 20   End of Session Nurse Communication: Mobility status  Activity Tolerance: Patient limited by fatigue Patient left: in bed;with call bell/phone within reach;with family/visitor present (sitting EOB eating with family present)  OT Visit Diagnosis: Unsteadiness on feet (R26.81);Pain;Muscle weakness (generalized) (M62.81)                Time: 2956-2130 OT Time Calculation (min): 14 min Charges:  OT General Charges $OT Visit: 1 Visit OT Evaluation $OT Eval Low Complexity: 1 Low  Raynald Kemp, OT Acute Rehabilitation Services Office: [phone]   Pilar Grammes 08/23/2023, 1:48 PM

## 2023-08-23 NOTE — Progress Notes (Signed)
  Progress Note   Patient: Ashley Riddle BJY:[id number] DOB: 12/24/1945 DOA: 08/20/2023     2 DOS: the patient was seen and examined on 08/23/2023   Brief hospital course: 78 y.o. female with past medical history  of allergies to Penton, pregabalin, aspirin, codeine, penicillin, and empagliflozin, essential hypertension, diabetes mellitus type 2, hepatitis C, sepsis history from pneumonia, history of cocaine, history of liver cirrhosis, COPD, gouty arthropathy, peripheral vascular disease, left total knee replacement comes to [place] today with fevers.  Patient is brought by EMS for shortness of breath and wheezing.  Initial vitals were stable with blood pressure 132/88 respirations of 19 and O2 sats 96% on room air.  Patient was having productive cough.  Patient also noted abdominal distention and discomfort.  Patient reported bilateral swelling.  No reports of chest pain nausea vomiting bleeding hematuria or falls.   Assessment and Plan: Severe sepsis with pneumonia present on admission with lactic acidosis -Presented with fever to 105F with tachycardia, leukocytosis with chest imaging confirming RLL infiltrate -flu, covid, RSV neg -blood cx is neg x 2 days -currently on azithro and rocephin -On RA this afternoon  COPD -Now on room air -no wheezing at this time -cont nebs as needed  Anasarca secondary to new diagnosis of HFpEF -diffuse anasarca noted on exam -UA without protein -2d echo with normal LVEF, mildly elevated pulm art pressure -Will need sleep study -TSH 7.246. Will f/u on free T4 -Clinically improving with lasix 40mg  bid -monitor I/o and daily wts  Hx hep C with cirrhosis -CT reviewed, no cirrhosis noted -Follow LFT trends  Diabetes type II -Glycemic trends stable -Cont SSI as needed  Anemia -Hemodynamically stable -Follow CBC trends      Subjective: States swelling and breathing have improved with lasix. Voiding well  Physical Exam: Vitals:   08/23/23  0459 08/23/23 0723 08/23/23 1215 08/23/23 1640  BP: 137/81 132/78 137/69 (!) 171/87  Pulse: 85 92 97 98  Resp: 18 16 17 18   Temp: 98.7 F (37.1 C) 98.3 F (36.8 C) 98.9 F (37.2 C) 98.8 F (37.1 C)  TempSrc: Oral Oral Oral Oral  SpO2: 98% 100% 99% 95%  Height:       General exam: Conversant, in no acute distress Respiratory system: normal chest rise, clear, no audible wheezing Cardiovascular system: regular rhythm, s1-s2 Gastrointestinal system: Nondistended, nontender, pos BS Central nervous system: No seizures, no tremors Extremities: No cyanosis, no joint deformities Skin: No rashes, no pallor Psychiatry: Affect normal // no auditory hallucinations   Data Reviewed:  Labs reviewed: Na 137, K 3.6, Cr 0.59, WBC 8.5, Hgb 8.4, Plts 151  Family Communication: Pt in room, family not at bedside  Disposition: Status is: Inpatient Remains inpatient appropriate because: severity of illness  Planned Discharge Destination: Home    Author: Rickey Barbara, MD 08/23/2023 4:52 PM  For on call review [email or web address].

## 2023-08-24 DIAGNOSIS — J189 Pneumonia, unspecified organism: Secondary | ICD-10-CM | POA: Diagnosis not present

## 2023-08-24 DIAGNOSIS — A419 Sepsis, unspecified organism: Secondary | ICD-10-CM | POA: Diagnosis not present

## 2023-08-24 LAB — COMPREHENSIVE METABOLIC PANEL
ALT: 13 U/L (ref 0–44)
AST: 18 U/L (ref 15–41)
Albumin: 3 g/dL — ABNORMAL LOW (ref 3.5–5.0)
Alkaline Phosphatase: 67 U/L (ref 38–126)
Anion gap: 10 (ref 5–15)
BUN: 8 mg/dL (ref 8–23)
CO2: 29 mmol/L (ref 22–32)
Calcium: 9.2 mg/dL (ref 8.9–10.3)
Chloride: 97 mmol/L — ABNORMAL LOW (ref 98–111)
Creatinine, Ser: 0.56 mg/dL (ref 0.44–1.00)
GFR, Estimated: >60 mL/min (ref >60)
Glucose, Bld: 129 mg/dL — ABNORMAL HIGH (ref 70–99)
Potassium: 3 mmol/L — ABNORMAL LOW (ref 3.5–5.1)
Sodium: 136 mmol/L (ref 135–145)
Total Bilirubin: 0.6 mg/dL (ref 0.0–1.2)
Total Protein: 6.8 g/dL (ref 6.5–8.1)

## 2023-08-24 LAB — CBC
HCT: 27.9 % — ABNORMAL LOW (ref 36.0–46.0)
Hemoglobin: 8.9 g/dL — ABNORMAL LOW (ref 12.0–15.0)
MCH: 25.9 pg — ABNORMAL LOW (ref 26.0–34.0)
MCHC: 31.9 g/dL (ref 30.0–36.0)
MCV: 81.3 fL (ref 80.0–100.0)
Platelets: 186 10*3/uL (ref 150–400)
RBC: 3.43 MIL/uL — ABNORMAL LOW (ref 3.87–5.11)
RDW: 13.2 % (ref 11.5–15.5)
WBC: 5.5 10*3/uL (ref 4.0–10.5)
nRBC: 0 % (ref 0.0–0.2)

## 2023-08-24 LAB — GLUCOSE, CAPILLARY
Glucose-Capillary: 122 mg/dL — ABNORMAL HIGH (ref 70–99)
Glucose-Capillary: 131 mg/dL — ABNORMAL HIGH (ref 70–99)
Glucose-Capillary: 149 mg/dL — ABNORMAL HIGH (ref 70–99)
Glucose-Capillary: 200 mg/dL — ABNORMAL HIGH (ref 70–99)
Glucose-Capillary: 209 mg/dL — ABNORMAL HIGH (ref 70–99)
Glucose-Capillary: 369 mg/dL — ABNORMAL HIGH (ref 70–99)

## 2023-08-24 LAB — T4, FREE: Free T4: 0.79 ng/dL (ref 0.61–1.12)

## 2023-08-24 MED ORDER — BUPRENORPHINE HCL-NALOXONE HCL 8-2 MG SL SUBL: 1.0000 | SUBLINGUAL_TABLET | Freq: Every day | SUBLINGUAL | Status: DC

## 2023-08-24 MED ORDER — BUPRENORPHINE HCL-NALOXONE HCL 2-0.5 MG SL SUBL
9.0000 | SUBLINGUAL_TABLET | Freq: Every day | SUBLINGUAL | Status: DC
  Administered 2023-08-24 – 2023-08-26 (×3): 9 via SUBLINGUAL
  Filled 2023-08-24 (×3): qty 9

## 2023-08-24 MED ORDER — POTASSIUM CHLORIDE CRYS ER 20 MEQ PO TBCR
60.0000 meq | EXTENDED_RELEASE_TABLET | ORAL | Status: AC
  Administered 2023-08-24 (×2): 60 meq via ORAL
  Filled 2023-08-24 (×2): qty 3

## 2023-08-24 MED ORDER — METHYLPREDNISOLONE SODIUM SUCC 40 MG IJ SOLR
40.0000 mg | Freq: Once | INTRAMUSCULAR | Status: AC
  Administered 2023-08-24: 40 mg via INTRAVENOUS
  Filled 2023-08-24: qty 1

## 2023-08-24 MED ORDER — ALPRAZOLAM 0.25 MG PO TABS
0.2500 mg | ORAL_TABLET | Freq: Once | ORAL | Status: AC
  Administered 2023-08-24: 0.25 mg via ORAL
  Filled 2023-08-24: qty 1

## 2023-08-24 NOTE — Progress Notes (Signed)
 Place measuring hat in restroom. Pt educate on the importance of measure her urine since she is receiving lasix at this time and her intake and output. This nurse will endorse to the NT and the ongoing shift nurse that the hat is in place.

## 2023-08-24 NOTE — Plan of Care (Signed)
  Problem: Education: Goal: Ability to describe self-care measures that may prevent or decrease complications (Diabetes Survival Skills Education) will improve Outcome: Progressing   Problem: Coping: Goal: Ability to adjust to condition or change in health will improve Outcome: Progressing   Problem: Metabolic: Goal: Ability to maintain appropriate glucose levels will improve Outcome: Progressing   Problem: Nutritional: Goal: Maintenance of adequate nutrition will improve Outcome: Progressing

## 2023-08-24 NOTE — Progress Notes (Signed)
  Progress Note   Patient: Ashley Riddle ZOX:[id number] DOB: Dec 05, 1945 DOA: 08/20/2023     3 DOS: the patient was seen and examined on 08/24/2023   Brief hospital course: 78 y.o. female with past medical history  of allergies to Penton, pregabalin, aspirin, codeine, penicillin, and empagliflozin, essential hypertension, diabetes mellitus type 2, hepatitis C, sepsis history from pneumonia, history of cocaine, history of liver cirrhosis, COPD, gouty arthropathy, peripheral vascular disease, left total knee replacement comes to [place] today with fevers.  Patient is brought by EMS for shortness of breath and wheezing.  Initial vitals were stable with blood pressure 132/88 respirations of 19 and O2 sats 96% on room air.  Patient was having productive cough.  Patient also noted abdominal distention and discomfort.  Patient reported bilateral swelling.  No reports of chest pain nausea vomiting bleeding hematuria or falls.   Assessment and Plan: Severe sepsis with pneumonia present on admission with lactic acidosis -Presented with fever to 105F with tachycardia, leukocytosis with chest imaging confirming RLL infiltrate -flu, covid, RSV neg -blood cx is neg x 2 days -currently on azithro and rocephin -On RA this afternoon  COPD -Now on room air -no wheezing at this time -cont nebs as needed  Anasarca secondary to new diagnosis of HFpEF -diffuse anasarca noted on exam -UA without protein -2d echo with normal LVEF, mildly elevated pulm art pressure -recommend outpt sleep study -TSH 7.246. Will f/u on free T4 -Clinically improving with lasix 40mg  bid -monitor I/o and daily wts  Hx hep C with cirrhosis -CT reviewed, no cirrhosis noted -Follow LFT trends  Diabetes type II -Glycemic trends stable -Cont SSI as needed  Anemia -Hemodynamically stable -Follow CBC trends  Chronic pain -Pt on suboxone PTA, confirmed -resume regimen  -Pt follows suboxone clinic  Hypokalemia -replaced       Subjective: Complaining of generalized pain  Physical Exam: Vitals:   08/24/23 0434 08/24/23 0837 08/24/23 0837 08/24/23 1206  BP:  (!) 154/85 (!) 154/85 139/79  Pulse:  94 94 93  Resp:  17  17  Temp:  98.5 F (36.9 C) 98.5 F (36.9 C) 98.8 F (37.1 C)  TempSrc:  Oral Oral Oral  SpO2:  94% 98% 97%  Weight: 97.2 kg     Height:       General exam: Awake, laying in bed, in nad Respiratory system: Normal respiratory effort, no wheezing Cardiovascular system: regular rate, s1, s2 Gastrointestinal system: Soft, nondistended, positive BS Central nervous system: CN2-12 grossly intact, strength intact Extremities: Perfused, no clubbing Skin: Normal skin turgor, no notable skin lesions seen Psychiatry: Mood normal // no visual hallucinations   Data Reviewed:  Labs reviewed: Na 136, K 3.0, Cr 0.56, WBC 5.5, Hgb 8.9  Family Communication: Pt in room, family not at bedside  Disposition: Status is: Inpatient Remains inpatient appropriate because: severity of illness  Planned Discharge Destination: Home    Author: Rickey Barbara, MD 08/24/2023 5:37 PM  For on call review [email or web address].

## 2023-08-24 NOTE — Plan of Care (Signed)
  Problem: Nutritional: Goal: Maintenance of adequate nutrition will improve Outcome: Progressing   Problem: Clinical Measurements: Goal: Cardiovascular complication will be avoided Outcome: Progressing   Problem: Activity: Goal: Risk for activity intolerance will decrease Outcome: Progressing   Problem: Elimination: Goal: Will not experience complications related to bowel motility Outcome: Not Progressing Goal: Will not experience complications related to urinary retention Outcome: Progressing   Problem: Activity: Goal: Ability to tolerate increased activity will improve Outcome: Progressing

## 2023-08-24 NOTE — Plan of Care (Signed)
  Problem: Nutritional: Goal: Maintenance of adequate nutrition will improve Outcome: Progressing   Problem: Clinical Measurements: Goal: Cardiovascular complication will be avoided Outcome: Progressing   Problem: Activity: Goal: Risk for activity intolerance will decrease Outcome: Progressing   Problem: Elimination: Goal: Will not experience complications related to bowel motility Outcome: Not Progressing Goal: Will not experience complications related to urinary retention Outcome: Progressing   Problem: Activity: Goal: Ability to tolerate increased activity will improve Outcome: Progressing   Problem: Health Behavior/Discharge Planning: Goal: Ability to identify changes in lifestyle to reduce recurrence of condition will improve Outcome: Progressing   Problem: Physical Regulation: Goal: Complications related to the disease process, condition or treatment will be avoided or minimized Outcome: Progressing   Problem: Safety: Goal: Ability to remain free from injury will improve Outcome: Not Progressing

## 2023-08-24 NOTE — Progress Notes (Signed)
 Called into the room and pt stated she does not feel right. Daughter then started to explain that the pt has been going to [facility] for treatment since February. The daughter then express her concern because she told ED MD on pt arrival and a nurse on 08/23/23. Notified Dr. Rhona Leavens via secure chat about the family and patient concern.  Patient is antsy at this time. Requested one time dose of anxiety medication. Dr. Rhona Leavens add pharmacist to the chat. See Mar for new orders. Pharmacist was able to call [facility] to get the correct information for the patient's suboxone dosage. Patient given the medication as prescribe and feeling a lot better. Will endorse to the ongoing nurse this event.

## 2023-08-25 DIAGNOSIS — J189 Pneumonia, unspecified organism: Secondary | ICD-10-CM | POA: Diagnosis not present

## 2023-08-25 DIAGNOSIS — A419 Sepsis, unspecified organism: Secondary | ICD-10-CM | POA: Diagnosis not present

## 2023-08-25 LAB — GLUCOSE, CAPILLARY
Glucose-Capillary: 154 mg/dL — ABNORMAL HIGH (ref 70–99)
Glucose-Capillary: 170 mg/dL — ABNORMAL HIGH (ref 70–99)
Glucose-Capillary: 170 mg/dL — ABNORMAL HIGH (ref 70–99)
Glucose-Capillary: 212 mg/dL — ABNORMAL HIGH (ref 70–99)

## 2023-08-25 LAB — COMPREHENSIVE METABOLIC PANEL
ALT: 16 U/L (ref 0–44)
AST: 22 U/L (ref 15–41)
Albumin: 3.5 g/dL (ref 3.5–5.0)
Alkaline Phosphatase: 71 U/L (ref 38–126)
Anion gap: 10 (ref 5–15)
BUN: 10 mg/dL (ref 8–23)
CO2: 30 mmol/L (ref 22–32)
Calcium: 10.1 mg/dL (ref 8.9–10.3)
Chloride: 97 mmol/L — ABNORMAL LOW (ref 98–111)
Creatinine, Ser: 0.59 mg/dL (ref 0.44–1.00)
GFR, Estimated: >60 mL/min (ref >60)
Glucose, Bld: 160 mg/dL — ABNORMAL HIGH (ref 70–99)
Potassium: 3.6 mmol/L (ref 3.5–5.1)
Sodium: 137 mmol/L (ref 135–145)
Total Bilirubin: 0.5 mg/dL (ref 0.0–1.2)
Total Protein: 7.7 g/dL (ref 6.5–8.1)

## 2023-08-25 LAB — CULTURE, BLOOD (ROUTINE X 2): Culture: NO GROWTH

## 2023-08-25 LAB — CBC
HCT: 32.2 % — ABNORMAL LOW (ref 36.0–46.0)
Hemoglobin: 10.3 g/dL — ABNORMAL LOW (ref 12.0–15.0)
MCH: 26.1 pg (ref 26.0–34.0)
MCHC: 32 g/dL (ref 30.0–36.0)
MCV: 81.7 fL (ref 80.0–100.0)
Platelets: 239 10*3/uL (ref 150–400)
RBC: 3.94 MIL/uL (ref 3.87–5.11)
RDW: 13 % (ref 11.5–15.5)
WBC: 8.8 10*3/uL (ref 4.0–10.5)
nRBC: 0 % (ref 0.0–0.2)

## 2023-08-25 LAB — MAGNESIUM: Magnesium: 1.7 mg/dL (ref 1.7–2.4)

## 2023-08-25 MED ORDER — TIZANIDINE HCL 2 MG PO TABS
4.0000 mg | ORAL_TABLET | Freq: Every day | ORAL | Status: DC
  Administered 2023-08-25: 4 mg via ORAL
  Filled 2023-08-25: qty 2

## 2023-08-25 NOTE — Care Management Important Message (Signed)
 Important Message  Patient Details  Name: Ashley Riddle MRN: [id number] Date of Birth: 15-Jun-1945   Important Message Given:  Yes - Medicare IM     Sherilyn Banker 08/25/2023, 12:43 PM

## 2023-08-25 NOTE — Plan of Care (Signed)
  Problem: Education: Goal: Ability to describe self-care measures that may prevent or decrease complications (Diabetes Survival Skills Education) will improve Outcome: Progressing Goal: Individualized Educational Video(s) Outcome: Progressing   Problem: Coping: Goal: Ability to adjust to condition or change in health will improve Outcome: Progressing   Problem: Fluid Volume: Goal: Ability to maintain a balanced intake and output will improve Outcome: Progressing   Problem: Health Behavior/Discharge Planning: Goal: Ability to identify and utilize available resources and services will improve Outcome: Progressing Goal: Ability to manage health-related needs will improve Outcome: Progressing   Problem: Metabolic: Goal: Ability to maintain appropriate glucose levels will improve Outcome: Progressing   Problem: Nutritional: Goal: Maintenance of adequate nutrition will improve Outcome: Progressing Goal: Progress toward achieving an optimal weight will improve Outcome: Progressing   Problem: Skin Integrity: Goal: Risk for impaired skin integrity will decrease Outcome: Progressing   Problem: Tissue Perfusion: Goal: Adequacy of tissue perfusion will improve Outcome: Progressing   Problem: Education: Goal: Knowledge of General Education information will improve Description: Including pain rating scale, medication(s)/side effects and non-pharmacologic comfort measures Outcome: Progressing   Problem: Health Behavior/Discharge Planning: Goal: Ability to manage health-related needs will improve Outcome: Progressing   Problem: Clinical Measurements: Goal: Ability to maintain clinical measurements within normal limits will improve Outcome: Progressing Goal: Will remain free from infection Outcome: Progressing Goal: Diagnostic test results will improve Outcome: Progressing Goal: Respiratory complications will improve Outcome: Progressing Goal: Cardiovascular complication will  be avoided Outcome: Progressing   Problem: Activity: Goal: Risk for activity intolerance will decrease Outcome: Progressing   Problem: Nutrition: Goal: Adequate nutrition will be maintained Outcome: Progressing   Problem: Coping: Goal: Level of anxiety will decrease Outcome: Progressing   Problem: Elimination: Goal: Will not experience complications related to bowel motility Outcome: Progressing Goal: Will not experience complications related to urinary retention Outcome: Progressing   Problem: Pain Managment: Goal: General experience of comfort will improve and/or be controlled Outcome: Progressing   Problem: Safety: Goal: Ability to remain free from injury will improve Outcome: Progressing   Problem: Skin Integrity: Goal: Risk for impaired skin integrity will decrease Outcome: Progressing   Problem: Activity: Goal: Ability to tolerate increased activity will improve Outcome: Progressing   Problem: Clinical Measurements: Goal: Ability to maintain a body temperature in the normal range will improve Outcome: Progressing   Problem: Respiratory: Goal: Ability to maintain adequate ventilation will improve Outcome: Progressing Goal: Ability to maintain a clear airway will improve Outcome: Progressing   Problem: Education: Goal: Knowledge of disease or condition will improve Outcome: Progressing Goal: Understanding of discharge needs will improve Outcome: Progressing   Problem: Health Behavior/Discharge Planning: Goal: Ability to identify changes in lifestyle to reduce recurrence of condition will improve Outcome: Progressing Goal: Identification of resources available to assist in meeting health care needs will improve Outcome: Progressing   Problem: Physical Regulation: Goal: Complications related to the disease process, condition or treatment will be avoided or minimized Outcome: Progressing   Problem: Safety: Goal: Ability to remain free from injury will  improve Outcome: Progressing

## 2023-08-25 NOTE — Plan of Care (Signed)
  Problem: Coping: Goal: Ability to adjust to condition or change in health will improve Outcome: Progressing   Problem: Fluid Volume: Goal: Ability to maintain a balanced intake and output will improve Outcome: Progressing   Problem: Metabolic: Goal: Ability to maintain appropriate glucose levels will improve Outcome: Not Progressing

## 2023-08-25 NOTE — Progress Notes (Signed)
 OT Cancellation Note  Patient Details Name: Penelope Fittro MRN: [id number] DOB: [birth year]   Cancelled Treatment:    Reason Eval/Treat Not Completed: Patient declined, no reason specified pt reports having already worked with PT. Pt up in recliner reporting that she already completed ADLS this AM. Pt politely declines session stating "I am going home today anyway." Will continue to follow for acute OT needs.   Lenor Derrick., COTA/L Acute Rehabilitation Services [phone]   Barron Schmid 08/25/2023, 10:10 AM

## 2023-08-25 NOTE — Progress Notes (Signed)
  Progress Note   Patient: Ashley Riddle ZOX:[id number] DOB: [birth year] DOA: 08/20/2023     4 DOS: the patient was seen and examined on 08/25/2023   Brief hospital course: 78 y.o. female with past medical history  of allergies to Penton, pregabalin, aspirin, codeine, penicillin, and empagliflozin, essential hypertension, diabetes mellitus type 2, hepatitis C, sepsis history from pneumonia, history of cocaine, history of liver cirrhosis, COPD, gouty arthropathy, peripheral vascular disease, left total knee replacement comes to [place] today with fevers.  Patient is brought by EMS for shortness of breath and wheezing.  Initial vitals were stable with blood pressure 132/88 respirations of 19 and O2 sats 96% on room air.  Patient was having productive cough.  Patient also noted abdominal distention and discomfort.  Patient reported bilateral swelling.  No reports of chest pain nausea vomiting bleeding hematuria or falls.   Assessment and Plan: Severe sepsis with pneumonia present on admission with lactic acidosis -Presented with fever to 105F with tachycardia, leukocytosis with chest imaging confirming RLL infiltrate -flu, covid, RSV neg -blood cx is neg x 2 days -currently on azithro and rocephin -Now on min O2 support  COPD -Now on room air -no wheezing at this time -cont nebs as needed  Anasarca secondary to new diagnosis of HFpEF -diffuse anasarca noted on exam -UA without protein -2d echo with normal LVEF, mildly elevated pulm art pressure -recommend outpt sleep study -TSH 7.246. Will f/u on free T4 -Clinically improving with lasix 40mg  bid, continuing to tolerate -monitor I/o and daily wts  Hx hep C with cirrhosis -CT reviewed, no cirrhosis noted -LFT's stable  Diabetes type II -Glycemic trends stable -Cont SSI as needed  Anemia -Hemodynamically stable -Follow CBC trends  Chronic pain -Pt on suboxone PTA, confirmed -resume regimen  -Pt follows suboxone  clinic  Hypokalemia -replaced      Subjective: Feeling much better. Swelling has improved. Eager to go home soon  Physical Exam: Vitals:   08/25/23 0402 08/25/23 0448 08/25/23 0836 08/25/23 1219  BP: (!) 144/78  (!) 177/95 136/87  Pulse: 93  94 95  Resp: 17  18 18   Temp: 98.6 F (37 C)  97.7 F (36.5 C) 98.1 F (36.7 C)  TempSrc: Oral  Oral Oral  SpO2: 95%  97% 98%  Weight:  91.4 kg    Height:       General exam: Conversant, in no acute distress Respiratory system: normal chest rise, clear, no audible wheezing Cardiovascular system: regular rhythm, s1-s2 Gastrointestinal system: Nondistended, nontender, pos BS Central nervous system: No seizures, no tremors Extremities: No cyanosis, no joint deformities Skin: No rashes, no pallor Psychiatry: Affect normal // no auditory hallucinations   Data Reviewed:  Labs reviewed: Na 137, K 3.6, Cr 0.59, WBC 8.8, Hgb 10.3  Family Communication: Pt in room, family not at bedside  Disposition: Status is: Inpatient Remains inpatient appropriate because: severity of illness  Planned Discharge Destination: Home    Author: Rickey Barbara, MD 08/25/2023 4:27 PM  For on call review [email or web address].

## 2023-08-25 NOTE — Progress Notes (Signed)
 Physical Therapy Treatment Patient Details Name: Ashley Riddle MRN: [id number] DOB: [birth year] Today's Date: 08/25/2023   History of Present Illness 78 y.o. female presents to [facility] hospital on 08/20/2023 with fevers, wheezing and SOB. CT concerning for RLL PNA. PMH includes HTN, DMII, hepatitis C, cocaine abuse, liver cirrhosis, COPD, gout, PVD, L TKA.    PT Comments  Pt tolerates treatment well, ambulating for increased distances and denying DOE. Pt demonstrates improved stability and is able to mobilize without UE support throughout session. PT will continue to follow but pt does appear to be quickly approaching her baseline.    If plan is discharge home, recommend the following: Help with stairs or ramp for entrance   Can travel by private vehicle        Equipment Recommendations  Rollator (4 wheels)    Recommendations for Other Services       Precautions / Restrictions Precautions Precautions: Fall Restrictions Weight Bearing Restrictions Per Provider Order: No     Mobility  Bed Mobility                    Transfers Overall transfer level: Independent                      Ambulation/Gait Ambulation/Gait assistance: Modified independent (Device/Increase time) Gait Distance (Feet): 250 Feet Assistive device: None Gait Pattern/deviations: Step-through pattern, Wide base of support Gait velocity: reduced Gait velocity interpretation: <1.8 ft/sec, indicate of risk for recurrent falls   General Gait Details: pt with slowed step-through gait, widened BOS   Stairs             Wheelchair Mobility     Tilt Bed    Modified Rankin (Stroke Patients Only)       Balance Overall balance assessment: Needs assistance Sitting-balance support: No upper extremity supported, Feet supported Sitting balance-Leahy Scale: Normal     Standing balance support: No upper extremity supported, During functional activity Standing balance-Leahy Scale:  Good                              Communication Communication Communication: No apparent difficulties  Cognition Arousal: Alert Behavior During Therapy: WFL for tasks assessed/performed   PT - Cognitive impairments: No apparent impairments                         Following commands: Intact      Cueing Cueing Techniques: Verbal cues  Exercises      General Comments General comments (skin integrity, edema, etc.): pt mobilizes on room air, O2 sats at 90%. Pt with a history of COPD, denies DOE      Pertinent Vitals/Pain Pain Assessment Pain Assessment: No/denies pain    Home Living                          Prior Function            PT Goals (current goals can now be found in the care plan section) Acute Rehab PT Goals Patient Stated Goal: to return to independence Progress towards PT goals: Progressing toward goals    Frequency    Min 2X/week      PT Plan      Co-evaluation              AM-PAC PT "6 Clicks" Mobility   Outcome Measure  Help  needed turning from your back to your side while in a flat bed without using bedrails?: None Help needed moving from lying on your back to sitting on the side of a flat bed without using bedrails?: None Help needed moving to and from a bed to a chair (including a wheelchair)?: None Help needed standing up from a chair using your arms (e.g., wheelchair or bedside chair)?: None Help needed to walk in hospital room?: None Help needed climbing 3-5 steps with a railing? : A Little 6 Click Score: 23    End of Session   Activity Tolerance: Patient tolerated treatment well Patient left: with call bell/phone within reach Nurse Communication: Mobility status PT Visit Diagnosis: Other abnormalities of gait and mobility (R26.89);Muscle weakness (generalized) (M62.81)     Time: 1610-9604 PT Time Calculation (min) (ACUTE ONLY): 9 min  Charges:    $Gait Training: 8-22 mins PT General  Charges $$ ACUTE PT VISIT: 1 Visit                     Arlyss Gandy, PT, DPT Acute Rehabilitation Office [phone]    Arlyss Gandy 08/25/2023, 2:44 PM

## 2023-08-26 ENCOUNTER — Other Ambulatory Visit (HOSPITAL_COMMUNITY): Payer: Self-pay

## 2023-08-26 DIAGNOSIS — J189 Pneumonia, unspecified organism: Secondary | ICD-10-CM | POA: Diagnosis not present

## 2023-08-26 DIAGNOSIS — A419 Sepsis, unspecified organism: Secondary | ICD-10-CM | POA: Diagnosis not present

## 2023-08-26 LAB — COMPREHENSIVE METABOLIC PANEL
ALT: 15 U/L (ref 0–44)
AST: 17 U/L (ref 15–41)
Albumin: 3.1 g/dL — ABNORMAL LOW (ref 3.5–5.0)
Alkaline Phosphatase: 68 U/L (ref 38–126)
Anion gap: 13 (ref 5–15)
BUN: 9 mg/dL (ref 8–23)
CO2: 28 mmol/L (ref 22–32)
Calcium: 9.4 mg/dL (ref 8.9–10.3)
Chloride: 98 mmol/L (ref 98–111)
Creatinine, Ser: 0.52 mg/dL (ref 0.44–1.00)
GFR, Estimated: >60 mL/min (ref >60)
Glucose, Bld: 118 mg/dL — ABNORMAL HIGH (ref 70–99)
Potassium: 3.3 mmol/L — ABNORMAL LOW (ref 3.5–5.1)
Sodium: 139 mmol/L (ref 135–145)
Total Bilirubin: 0.6 mg/dL (ref 0.0–1.2)
Total Protein: 6.8 g/dL (ref 6.5–8.1)

## 2023-08-26 LAB — CBC
HCT: 29.6 % — ABNORMAL LOW (ref 36.0–46.0)
Hemoglobin: 9.3 g/dL — ABNORMAL LOW (ref 12.0–15.0)
MCH: 25.9 pg — ABNORMAL LOW (ref 26.0–34.0)
MCHC: 31.4 g/dL (ref 30.0–36.0)
MCV: 82.5 fL (ref 80.0–100.0)
Platelets: 242 10*3/uL (ref 150–400)
RBC: 3.59 MIL/uL — ABNORMAL LOW (ref 3.87–5.11)
RDW: 13.2 % (ref 11.5–15.5)
WBC: 6.2 10*3/uL (ref 4.0–10.5)
nRBC: 0 % (ref 0.0–0.2)

## 2023-08-26 LAB — CULTURE, BLOOD (ROUTINE X 2)
Culture: NO GROWTH
Special Requests: ADEQUATE

## 2023-08-26 LAB — GLUCOSE, CAPILLARY
Glucose-Capillary: 111 mg/dL — ABNORMAL HIGH (ref 70–99)
Glucose-Capillary: 190 mg/dL — ABNORMAL HIGH (ref 70–99)

## 2023-08-26 MED ORDER — FUROSEMIDE 40 MG PO TABS
40.0000 mg | ORAL_TABLET | Freq: Every day | ORAL | 0 refills | Status: AC
  Filled 2023-08-26: qty 30, 30d supply, fill #0

## 2023-08-26 MED ORDER — BENZONATATE 100 MG PO CAPS
100.0000 mg | ORAL_CAPSULE | Freq: Two times a day (BID) | ORAL | 0 refills | Status: AC
Start: 2023-08-26 — End: ?
  Filled 2023-08-26: qty 20, 10d supply, fill #0

## 2023-08-26 MED ORDER — POTASSIUM CHLORIDE CRYS ER 20 MEQ PO TBCR
40.0000 meq | EXTENDED_RELEASE_TABLET | ORAL | Status: AC
Start: 2023-08-26 — End: 2023-08-26
  Administered 2023-08-26 (×2): 40 meq via ORAL
  Filled 2023-08-26 (×2): qty 2

## 2023-08-26 NOTE — Progress Notes (Signed)
 Nurse requested Mobility Specialist to perform oxygen saturation test with pt which includes removing pt from oxygen both at rest and while ambulating.  Below are the results from that testing.     Patient Saturations on Room Air at Rest = spO2 95%  Patient Saturations on Room Air while Ambulating = sp02 85% .  Rested and performed pursed lip breathing for 1 minute with sp02 at 94%.  At end of testing pt left in room on RA.  Reported results to nurse.

## 2023-08-26 NOTE — Consult Note (Signed)
 [facility] Liaison Consult Note   08/26/2023  Ashley Riddle April 20, 1946 [id number]  Insurance: [employer] [employer] Medicare Dual Complete  Primary Care Provider: [place], MD with [facility], this provider is listed for the transition of care follow up appointments.   [facility] Liaison met patient at bedside at [facility].  Rounding - onI report no banner.    The patient was screened for  day readmission hospitalization with noted high medium risk score for unplanned readmission risk 2 ED and 1 hospital admissions in 6 months.  The patient was assessed for potential [facility] Coordination service needs for post hospital transition for care coordination.   Plan: [facility] Liaison will continue to follow progress and disposition to assess for post hospital community care coordination/management needs.  Referral request for community care coordination: None at current time.   [facility], Population Health does not replace or interfere with any arrangements made by the Inpatient Transition of Care team.   For questions contact:   Charlesetta Shanks, RN, BSN, CCM [place]  [facility], [facility] Liaison Direct Dial: [phone] or secure chat Email: [email or web address]

## 2023-08-26 NOTE — Plan of Care (Signed)
  Problem: Coping: Goal: Ability to adjust to condition or change in health will improve Outcome: Progressing   Problem: Health Behavior/Discharge Planning: Goal: Ability to identify and utilize available resources and services will improve Outcome: Progressing Goal: Ability to manage health-related needs will improve Outcome: Progressing   Problem: Nutritional: Goal: Maintenance of adequate nutrition will improve Outcome: Progressing   Problem: Education: Goal: Knowledge of General Education information will improve Description: Including pain rating scale, medication(s)/side effects and non-pharmacologic comfort measures Outcome: Progressing   Problem: Activity: Goal: Risk for activity intolerance will decrease Outcome: Progressing

## 2023-08-26 NOTE — Progress Notes (Signed)
 OT Cancellation Note  Patient Details Name: Ashley Riddle MRN: [id number] DOB: [birth year]   Cancelled Treatment:    Reason Eval/Treat Not Completed: Patient declined, no reason specified. Pt verbalizing. "I can do everything myself, I don't need any help, I just want to sleep". With max encouragement pt agreeable for OT return at 11 to complete session. Will return as able to.    Ivor Messier, OT  Acute Rehabilitation Services Office [phone] Secure chat preferred   Marilynne Drivers 08/26/2023, 8:37 AM

## 2023-08-26 NOTE — TOC Initial Note (Addendum)
 Transition of Care (TOC) - Initial/Assessment Note   Spoke to patient at bedside. Patient from home with her 76 year old great grandson.   PT/OT  recommending HHPT / OT and Rolator for home.   Patient voiced understanding but declining both at present. Patient has walker at home from a previous surgery . If she changes her mind PCP can arrange    1243 MD ordered home oxygen. Patient in agreement. NCM ordered with Jermaine with [employer]. Portable oxygen DME to come to bedside prior to discharge. [employer] will arrange delivery of home oxygen to patient's home. Patient voiced understanding  Patient Details  Name: Ashley Riddle MRN: [id number] Date of Birth: February 01, 1946  Transition of Care [facility]) CM/SW Contact:    Kingsley Plan, RN Phone Number: 08/26/2023, 12:17 PM  Clinical Narrative:                   Expected Discharge Plan: Home w Home Health Services Barriers to Discharge: Continued Medical Work up   Patient Goals and CMS Choice Patient states their goals for this hospitalization and ongoing recovery are:: to return to home CMS Medicare.gov Compare Post Acute Care list provided to:: Patient Choice offered to / list presented to : Patient      Expected Discharge Plan and Services   Discharge Planning Services: CM Consult Post Acute Care Choice: Home Health, Durable Medical Equipment Living arrangements for the past 2 months: Single Family Home                 DME Arranged:  (patient declined)         HH Arranged: Patient Refused HH (patient declined)          Prior Living Arrangements/Services Living arrangements for the past 2 months: Single Family Home Lives with:: Adult Children (35 year old great grandson) Patient language and need for interpreter reviewed:: Yes Do you feel safe going back to the place where you live?: Yes      Need for Family Participation in Patient Care: Yes (Comment) Care giver support system in place?: Yes (comment) Current home  services: DME Criminal Activity/Legal Involvement Pertinent to Current Situation/Hospitalization: No - Comment as needed  Activities of Daily Living   ADL Screening (condition at time of admission) Independently performs ADLs?: Yes (appropriate for developmental age) Is the patient deaf or have difficulty hearing?: Yes Does the patient have difficulty seeing, even when wearing glasses/contacts?: No Does the patient have difficulty concentrating, remembering, or making decisions?: No  Permission Sought/Granted   Permission granted to share information with : No              Emotional Assessment Appearance:: Appears stated age Attitude/Demeanor/Rapport: Engaged Affect (typically observed): Accepting Orientation: : Oriented to Self, Oriented to Place, Oriented to  Time, Oriented to Situation Alcohol / Substance Use: Illicit Drugs Psych Involvement: No (comment)  Admission diagnosis:  Sepsis due to pneumonia (HCC) [J18.9, A41.9] Community acquired pneumonia of right lung, unspecified part of lung [J18.9] Sepsis, due to unspecified organism, unspecified whether acute organ dysfunction present [facility]) [A41.9] Patient Active Problem List   Diagnosis Date Noted   Sepsis due to pneumonia (HCC) 08/21/2023   Pneumonia 04/25/2022   Hyponatremia 04/25/2022   Carpal tunnel syndrome 11/17/2020   Arthritis 09/03/2020   Neuropathic pain 03/01/2020   S/P TKR (total knee replacement) using cement, left 12/14/2019   DM (diabetes mellitus) (HCC) 11/15/2019   Medication management 03/23/2019   Chronic pain of both knees 01/26/2019  Trochanteric bursitis, right hip 12/15/2018   Lumbar radiculopathy 11/06/2018   Acute right-sided low back pain 10/06/2018   COPD with acute exacerbation (HCC) 04/20/2018   Anxiety and depression 04/20/2018   Pain of left shoulder joint on movement 04/13/2017   Chronic bronchitis (HCC) 03/29/2017   Gouty arthropathy 03/02/2017   Hyperammonemia (HCC) 03/02/2017    Liver cirrhosis (HCC) 02/18/2017   Leukocytosis 02/18/2017   Sinus tachycardia 02/18/2017   Tachypnea 02/18/2017   RLL pneumonia 02/18/2017   Increased ammonia level 02/18/2017   Asthma 11/25/2016   Idiopathic progressive polyneuropathy 11/25/2016   Peripheral vascular disease (HCC) 11/25/2016   Obesity 11/25/2016   Prediabetes 11/02/2016   Retained metal fragment left scapula 12/26/2015   Fracture of glenoid process of left scapula 12/29/2014   Status post total shoulder arthroplasty 12/27/2014   Cocaine dependence in remission (HCC) 09/29/2014   Severe heroin dependence in sustained remission (HCC) 09/29/2014   Opiate abuse, episodic (HCC) 09/29/2014   Mild tetrahydrocannabinol (THC) abuse 09/29/2014   Chronic pain syndrome 04/18/2014   Primary osteoarthritis of left shoulder 04/18/2014   Primary osteoarthritis of right knee 04/18/2014   HYPERTENSION, BENIGN ESSENTIAL 03/22/2010   CONSTIPATION 03/22/2010   HEPATITIS C 06/04/1995   PCP:  [place], MD Pharmacy:   [id number] - [place], [place] - [address] AT [facility] [address] [place] [address] Phone: [phone] Fax: [phone]     Social Drivers of Health (SDOH) Social History: SDOH Screenings   Food Insecurity: No Food Insecurity (08/21/2023)  Housing: Low Risk  (08/21/2023)  Transportation Needs: No Transportation Needs (08/21/2023)  Utilities: Not At Risk (08/21/2023)  Social Connections: Socially Isolated (08/21/2023)  Tobacco Use: Medium Risk (08/20/2023)   SDOH Interventions:     Readmission Risk Interventions     No data to display

## 2023-08-26 NOTE — Progress Notes (Addendum)
 Mobility Specialist Progress Note:   08/26/23 1225  Mobility  Activity Ambulated with assistance in hallway  Level of Assistance Standby assist, set-up cues, supervision of patient - no hands on  Assistive Device None  Distance Ambulated (ft) 400 ft  Activity Response Tolerated well  Mobility Referral Yes  Mobility visit 1 Mobility  Mobility Specialist Start Time (ACUTE ONLY) 1210  Mobility Specialist Stop Time (ACUTE ONLY) 1224  Mobility Specialist Time Calculation (min) (ACUTE ONLY) 14 min   Pre Mobility:  95% SpO2 RA During Mobility: 112 HR , 85-94% SpO2 RA  Post Mobility: 95% SpO2  Pt received in chair, agreeable to mobility. Desat to 85% during ambulation but quickly elevated to 94% with pursed lip breathing. Pt denied any SOB during ambulation, asx throughout. No unsteadiness present. Pt returned to bed with call bell in reach and all needs met. RN notified.  Leory Plowman  Mobility Specialist Please contact via [employer] office at [phone]

## 2023-08-26 NOTE — Discharge Summary (Signed)
 Physician Discharge Summary   Patient: Ashley Riddle MRN: [id number] DOB: 1945/08/27  Admit date:     08/20/2023  Discharge date: 08/26/23  Discharge Physician: Rickey Barbara   PCP: Harvest Forest, MD   Recommendations at discharge:    Follow up with PCP In 1-2 weeks Recommend outpatient sleep study  Discharge Diagnoses: Principal Problem:   Sepsis due to pneumonia [facility]) Active Problems:   HYPERTENSION, BENIGN ESSENTIAL   DM (diabetes mellitus) (HCC)  Resolved Problems:   * No resolved hospital problems. *  Hospital Course: 78 y.o. female with past medical history  of allergies to [place], pregabalin, aspirin, codeine, penicillin, and empagliflozin, essential hypertension, diabetes mellitus type 2, hepatitis C, sepsis history from pneumonia, history of cocaine, history of liver cirrhosis, COPD, gouty arthropathy, peripheral vascular disease, left total knee replacement comes to [place] today with fevers.  Patient is brought by EMS for shortness of breath and wheezing.  Initial vitals were stable with blood pressure 132/88 respirations of 19 and O2 sats 96% on room air.  Patient was having productive cough.  Patient also noted abdominal distention and discomfort.  Patient reported bilateral swelling.  No reports of chest pain nausea vomiting bleeding hematuria or falls.   Assessment and Plan: Severe sepsis with pneumonia present on admission with lactic acidosis -Presented with fever to 105F with tachycardia, leukocytosis with chest imaging confirming RLL infiltrate -flu, covid, RSV neg -blood cx is neg -completed course of azithro and rocephin -Now on min O2 support at rest. Will need O2 on exertion   COPD -Now on room air -no wheezing at this time -cont nebs as needed -Desaturated to 85% on ambulation without feeling dyspnea. Qualified for home o2   Anasarca secondary to new diagnosis of HFpEF -diffuse anasarca noted on exam -UA without protein -2d echo with normal  LVEF, mildly elevated pulm art pressure -recommend outpt sleep study -TSH 7.246. Will f/u on free T4 -Clinically improving with lasix 40mg  bid, continuing to tolerate -monitor I/o and daily wts   Hx hep C with cirrhosis -CT reviewed, no cirrhosis noted -LFT's remained   Diabetes type II -Glycemic trends stable -Cont SSI as needed   Anemia -Hemodynamically stable   Chronic pain -Pt on suboxone PTA, confirmed -resume regimen  -Pt follows suboxone clinic   Hypokalemia -replaced    Consultants:  Procedures performed:   Disposition: Home Diet recommendation:  Cardiac diet DISCHARGE MEDICATION: Allergies as of 08/26/2023       Reactions   Gabapentin Other (See Comments)   Dizziness- States was admitted to hospital   Pregabalin Other (See Comments)   Confusion   Aspirin Other (See Comments)   Due to history of hepatitis   Codeine Other (See Comments)   Makes the stomach cramp   Penicillins Hives, Swelling, Other (See Comments)   Tolerated ceftriaxone and cefepime many times   Empagliflozin Swelling   Jardiance        Medication List     STOP taking these medications    Belbuca 750 MCG Film Generic drug: Buprenorphine HCl       TAKE these medications    acetaminophen 500 MG tablet Commonly known as: TYLENOL Take 1,000 mg by mouth as needed for mild pain (pain score 1-3).   albuterol 108 (90 Base) MCG/ACT inhaler Commonly known as: VENTOLIN HFA Inhale 2 puffs into the lungs every 4 (four) hours as needed for wheezing or shortness of breath.   amLODipine 10 MG tablet Commonly known as: NORVASC Take  10 mg by mouth daily.   aspirin EC 81 MG tablet Take 81 mg by mouth daily. Swallow whole.   benzonatate 100 MG capsule Commonly known as: TESSALON Take 1 capsule (100 mg total) by mouth 2 (two) times daily.   buprenorphine-naloxone 2-0.5 mg Subl SL tablet Commonly known as: SUBOXONE Place 9 tablets under the tongue daily. Verified with [facility] that she was getting the 2/0.5 mg SL tabs x 9 tablets for a total dose of 18 mg of buprenorphine   colchicine 0.6 MG tablet Take 0.6 mg by mouth daily as needed (as directed for gout flares).   cyanocobalamin 1000 MCG tablet Take 1,000 mcg by mouth once a week. Take on Monday   DULoxetine 30 MG capsule Commonly known as: CYMBALTA Take 30 mg by mouth 2 (two) times daily.   ergocalciferol 1.25 MG (50000 UT) capsule Commonly known as: VITAMIN D2 Take 50,000 Units by mouth every Monday.   ferrous sulfate 324 MG Tbec Take 324 mg by mouth daily.   furosemide 40 MG tablet Commonly known as: Lasix Take 1 tablet (40 mg total) by mouth daily.   Fusion Plus Caps Take 1 capsule by mouth daily.   hydrALAZINE 50 MG tablet Commonly known as: APRESOLINE Take 50 mg by mouth 2 (two) times daily.   Januvia 50 MG tablet Generic drug: sitaGLIPtin Take 50 mg by mouth daily.   losartan 100 MG tablet Commonly known as: COZAAR Take 100 mg by mouth daily.   multivitamin with minerals Tabs tablet Take 1 tablet by mouth daily. One-A-Day for Women   oxyCODONE-acetaminophen 10-325 MG tablet Commonly known as: PERCOCET Take 1 tablet by mouth in the morning, at noon, and at bedtime.   rosuvastatin 5 MG tablet Commonly known as: CRESTOR Take 5 mg by mouth daily.   sertraline 50 MG tablet Commonly known as: ZOLOFT Take 50 mg by mouth daily.   tiZANidine 4 MG tablet Commonly known as: ZANAFLEX Take 4 mg by mouth at bedtime.        Follow-up Information     Bakare, Mobolaji B, MD Follow up in 2 week(s).   Specialty: Internal Medicine Why: Hospital follow up Contact information: [address] Raeanne Gathers [place] [address] [phone]                Discharge Exam: Filed Weights   08/24/23 0434 08/25/23 0448 08/26/23 0500  Weight: 97.2 kg 91.4 kg 96.7 kg   General exam: Awake, laying in bed, in nad Respiratory system: Normal respiratory effort,  no wheezing Cardiovascular system: regular rate, s1, s2 Gastrointestinal system: Soft, nondistended, positive BS Central nervous system: CN2-12 grossly intact, strength intact Extremities: Perfused, no clubbing Skin: Normal skin turgor, no notable skin lesions seen Psychiatry: Mood normal // no visual hallucinations   Condition at discharge: fair  The results of significant diagnostics from this hospitalization (including imaging, microbiology, ancillary and laboratory) are listed below for reference.   Imaging Studies: ECHOCARDIOGRAM COMPLETE Result Date: 08/23/2023    ECHOCARDIOGRAM REPORT   Patient Name:   Anayeli KNOX Stebbins Date of Exam: 08/23/2023 Medical Rec #:  [id number]            Height:       61.0 in Accession #:    [id number]           Weight:       195.1 lb Date of Birth:  March 06, 1946  BSA:          1.869 m Patient Age:    77 years             BP:           132/78 mmHg Patient Gender: F                    HR:           91 bpm. Exam Location:  Inpatient Procedure: 2D Echo, Color Doppler and Cardiac Doppler (Both Spectral and Color            Flow Doppler were utilized during procedure). Indications:    CHF  History:        Patient has prior history of Echocardiogram examinations. COPD,                 Arrythmias:Tachycardia; Risk Factors:Hypertension.  Sonographer:    Lamont Snowball Referring Phys: [phone] Lattie Cervi K Cederic Mozley IMPRESSIONS  1. The aortic valve is calcified. There is moderate calcification of the aortic valve. There is moderate thickening of the aortic valve. Aortic valve regurgitation is not visualized. Moderate aortic valve stenosis. Aortic valve area, by VTI measures 1.36 cm. Aortic valve mean gradient measures 24.0 mmHg. Aortic valve Vmax measures 3.22 m/s.  2. Left ventricular ejection fraction, by estimation, is 60 to 65%. The left ventricle has normal function. The left ventricle has no regional wall motion abnormalities. Left ventricular diastolic parameters are  consistent with Grade I diastolic dysfunction (impaired relaxation).  3. Right ventricular systolic function is normal. The right ventricular size is normal. There is mildly elevated pulmonary artery systolic pressure. The estimated right ventricular systolic pressure is 41.4 mmHg.  4. Left atrial size was moderately dilated.  5. The mitral valve is normal in structure. Trivial mitral valve regurgitation. No evidence of mitral stenosis.  6. The inferior vena cava is dilated in size with >50% respiratory variability, suggesting right atrial pressure of 8 mmHg. FINDINGS  Left Ventricle: Left ventricular ejection fraction, by estimation, is 60 to 65%. The left ventricle has normal function. The left ventricle has no regional wall motion abnormalities. The left ventricular internal cavity size was normal in size. There is  no left ventricular hypertrophy. Left ventricular diastolic parameters are consistent with Grade I diastolic dysfunction (impaired relaxation). Right Ventricle: The right ventricular size is normal. No increase in right ventricular wall thickness. Right ventricular systolic function is normal. There is mildly elevated pulmonary artery systolic pressure. The tricuspid regurgitant velocity is 2.89  m/s, and with an assumed right atrial pressure of 8 mmHg, the estimated right ventricular systolic pressure is 41.4 mmHg. Left Atrium: Left atrial size was moderately dilated. Right Atrium: Right atrial size was normal in size. Pericardium: There is no evidence of pericardial effusion. Mitral Valve: The mitral valve is normal in structure. Trivial mitral valve regurgitation. No evidence of mitral valve stenosis. MV peak gradient, 10.5 mmHg. The mean mitral valve gradient is 6.3 mmHg. Tricuspid Valve: The tricuspid valve is normal in structure. Tricuspid valve regurgitation is trivial. No evidence of tricuspid stenosis. Aortic Valve: The aortic valve is calcified. There is moderate calcification of the aortic  valve. There is moderate thickening of the aortic valve. Aortic valve regurgitation is not visualized. Moderate aortic stenosis is present. Aortic valve mean gradient measures 24.0 mmHg. Aortic valve peak gradient measures 41.5 mmHg. Aortic valve area, by VTI measures 1.36 cm. Pulmonic Valve: The pulmonic valve was normal in structure. Pulmonic valve regurgitation  is not visualized. No evidence of pulmonic stenosis. Aorta: The aortic root is normal in size and structure. Venous: The inferior vena cava is dilated in size with greater than 50% respiratory variability, suggesting right atrial pressure of 8 mmHg. IAS/Shunts: No atrial level shunt detected by color flow Doppler.  LEFT VENTRICLE PLAX 2D LVIDd:         5.20 cm   Diastology LVIDs:         3.20 cm   LV e' medial:    9.36 cm/s LV PW:         0.90 cm   LV E/e' medial:  13.4 LV IVS:        0.90 cm   LV e' lateral:   12.20 cm/s LVOT diam:     2.00 cm   LV E/e' lateral: 10.2 LV SV:         95 LV SV Index:   51 LVOT Area:     3.14 cm  RIGHT VENTRICLE             IVC RV Basal diam:  3.70 cm     IVC diam: 2.20 cm RV S prime:     13.40 cm/s TAPSE (M-mode): 2.4 cm LEFT ATRIUM              Index        RIGHT ATRIUM           Index LA diam:        3.70 cm  1.98 cm/m   RA Area:     10.40 cm LA Vol (A2C):   101.0 ml 54.04 ml/m  RA Volume:   19.90 ml  10.65 ml/m LA Vol (A4C):   67.7 ml  36.22 ml/m LA Biplane Vol: 83.2 ml  44.52 ml/m  AORTIC VALVE AV Area (Vmax):    1.33 cm AV Area (Vmean):   1.21 cm AV Area (VTI):     1.36 cm AV Vmax:           322.00 cm/s AV Vmean:          231.000 cm/s AV VTI:            0.698 m AV Peak Grad:      41.5 mmHg AV Mean Grad:      24.0 mmHg LVOT Vmax:         136.00 cm/s LVOT Vmean:        89.000 cm/s LVOT VTI:          0.301 m LVOT/AV VTI ratio: 0.43  AORTA Ao Root diam: 2.70 cm Ao Asc diam:  3.10 cm MITRAL VALVE                TRICUSPID VALVE MV Area (PHT): 3.76 cm     TR Peak grad:   33.4 mmHg MV Area VTI:   2.76 cm     TR  Vmax:        289.00 cm/s MV Peak grad:  10.5 mmHg MV Mean grad:  6.3 mmHg     SHUNTS MV Vmax:       1.62 m/s     Systemic VTI:  0.30 m MV Vmean:      122.0 cm/s   Systemic Diam: 2.00 cm MV Decel Time: 202 msec MV E velocity: 125.00 cm/s MV A velocity: 140.00 cm/s MV E/A ratio:  0.89 Donato Schultz MD Electronically signed by Donato Schultz MD Signature Date/Time: 08/23/2023/12:32:12 PM    Final    DG  Swallowing Func-Speech Pathology Result Date: 08/22/2023 Table formatting from the original result was not included. Modified Barium Swallow Study Patient Details Name: Analiese Krupka MRN: [id number] Date of Birth: Apr 19, 1946 Today's Date: 08/22/2023 HPI/PMH: HPI: EZME DUCH is a 78 yo female presenting with shortness of breath. CT Chest shows RLL consolidation compatible with PNA. Rectal temperate noted to be 105 and code sepsis was initiated. Had RLL PNA as recently as 05/03/22.  OP MBS 06/08/20 WFL. PMH includes hepatitis C, HTN, chronic pain syndrome, cocaine/heroin dependence now in remission, episodic opioid abuse, chronic bronchitis, T2DM, peripheral vascular disease Clinical Impression: Clinical Impression: Pt presents with normal oropharyngeal swallow function - there was prolonged mastication due to absence of teeth, but adequate propulsion into pharynx; reliable laryngeal vestibule closure with no penetration/aspiration; adequate pharyngeal clearance/no residue.  An esophageal sweep revealed clearance of 13 mm barium pill and solid barium consistencies.  Doubt dysphagia-related aspiration as contributor to recurring pna. Continue regular solids, thin liquids; pills may be taken whole in water. No SLP f/u is needed - our service will sign off. Factors that may increase risk of adverse event in presence of aspiration [name] & Clearance [name] 2021): No data recorded Recommendations/Plan: Swallowing Evaluation Recommendations Swallowing Evaluation Recommendations Recommendations: PO diet PO Diet Recommendation:  Regular; Thin liquids (Level 0) Liquid Administration via: Cup; Straw Medication Administration: Whole meds with liquid Supervision: Patient able to self-feed Oral care recommendations: Oral care BID (2x/day) Treatment Plan Treatment Plan Treatment recommendations: No treatment recommended at this time Recommendations Recommendations for follow up therapy are one component of a multi-disciplinary discharge planning process, led by the attending physician.  Recommendations may be updated based on patient status, additional functional criteria and insurance authorization. Assessment: Orofacial Exam: Orofacial Exam Oral Cavity: Oral Hygiene: WFL Oral Cavity - Dentition: Edentulous; Dentures, not available Orofacial Anatomy: WFL Oral Motor/Sensory Function: WFL Anatomy: Anatomy: WFL Boluses Administered: Boluses Administered Boluses Administered: Thin liquids (Level 0); Mildly thick liquids (Level 2, nectar thick); Puree; Solid  Oral Impairment Domain: Oral Impairment Domain Lip Closure: No labial escape Tongue control during bolus hold: Cohesive bolus between tongue to palatal seal Bolus preparation/mastication: Slow prolonged chewing/mashing with complete recollection Bolus transport/lingual motion: Brisk tongue motion Oral residue: Complete oral clearance Location of oral residue : N/A Initiation of pharyngeal swallow : Posterior laryngeal surface of the epiglottis  Pharyngeal Impairment Domain: Pharyngeal Impairment Domain Soft palate elevation: No bolus between soft palate (SP)/pharyngeal wall (PW) Laryngeal elevation: Complete superior movement of thyroid cartilage with complete approximation of arytenoids to epiglottic petiole Anterior hyoid excursion: Complete anterior movement Epiglottic movement: Complete inversion Laryngeal vestibule closure: Complete, no air/contrast in laryngeal vestibule Pharyngeal stripping wave : Present - complete Pharyngoesophageal segment opening: Complete distension and complete  duration, no obstruction of flow Tongue base retraction: No contrast between tongue base and posterior pharyngeal wall (PPW) Pharyngeal residue: Complete pharyngeal clearance  Esophageal Impairment Domain: Esophageal Impairment Domain Esophageal clearance upright position: Complete clearance, esophageal coating Pill: Pill Consistency administered: Thin liquids (Level 0) Thin liquids (Level 0): [facility] Penetration/Aspiration Scale Score: No data recorded Compensatory Strategies: No data recorded  General Information: Caregiver present: No  Diet Prior to this Study: Regular; Thin liquids (Level 0)   No data recorded  No data recorded  Supplemental O2: Nasal cannula   History of Recent Intubation: No  Behavior/Cognition: Alert; Cooperative; Pleasant mood Self-Feeding Abilities: Able to self-feed Baseline vocal quality/speech: Normal Volitional Cough: Able to elicit Volitional Swallow: Able to elicit No data recorded Goal Planning: Prognosis  for improved oropharyngeal function: Good No data recorded No data recorded Patient/Family Stated Goal: none stated Consulted and agree with results and recommendations: Patient Pain: Pain Assessment Pain Assessment: Faces Faces Pain Scale: 2 Pain Location: all over Pain Descriptors / Indicators: Aching Pain Intervention(s): Limited activity within patient's tolerance End of Session: Start Time:SLP Start Time (ACUTE ONLY): 0831 Stop Time: SLP Stop Time (ACUTE ONLY): 0851 Time Calculation:SLP Time Calculation (min) (ACUTE ONLY): 20 min Charges: SLP Evaluations $ SLP Speech Visit: 1 Visit SLP Evaluations $BSS Swallow: 1 Procedure $MBS Swallow: 1 Procedure SLP visit diagnosis: SLP Visit Diagnosis: Dysphagia, unspecified (R13.10) Past Medical History: Past Medical History: Diagnosis Date  Anxiety   Arthritis   CAP (community acquired pneumonia) 02/18/2017  Constipation   COPD (chronic obstructive pulmonary disease) (HCC)   DDD (degenerative disc disease), cervical   DDD (degenerative disc  disease), lumbar   Gout   Heart murmur   probable bicuspid AV with mild AS, mild MR by 04/22/14 Echo (Dr. Shana Chute)  Hepatitis C   treated 2016   History of blood transfusion   Hypertension   Hypomagnesemia 03/02/2017  Pneumonia 12/02/2013  Sepsis (HCC) 04/19/2020 Past Surgical History: Past Surgical History: Procedure Laterality Date  ABDOMINAL HYSTERECTOMY    APPENDECTOMY    BACK SURGERY    CESAREAN SECTION    x 3  COLONOSCOPY W/ POLYPECTOMY    HARDWARE REMOVAL Left 12/26/2015  Procedure: REMOVAL K-WIRE LEFT SCAPULA;  Surgeon: Valeria Batman, MD;  Location: [facility] OR;  Service: Orthopedics;  Laterality: Left;  INNER EAR SURGERY    blood vessel  TONSILLECTOMY    TOTAL SHOULDER ARTHROPLASTY Left 12/27/2014  Procedure: TOTAL SHOULDER ARTHROPLASTY;  Surgeon: Valeria Batman, MD;  Location: [facility];  Service: Orthopedics;  Laterality: Left; Carolan Shiver 08/22/2023, 9:35 AM Marchelle Folks L. Couture, MA CCC/SLP Clinical Specialist - Acute Care SLP Acute Rehabilitation Services Office number [phone]  CT Head Wo Contrast Result Date: 08/21/2023 CLINICAL DATA:  Sepsis EXAM: CT HEAD WITHOUT CONTRAST TECHNIQUE: Contiguous axial images were obtained from the base of the skull through the vertex without intravenous contrast. RADIATION DOSE REDUCTION: This exam was performed according to the departmental dose-optimization program which includes automated exposure control, adjustment of the mA and/or kV according to patient size and/or use of iterative reconstruction technique. COMPARISON:  01/24/2016 FINDINGS: Brain: Mild age related volume loss. No acute intracranial abnormality. Specifically, no hemorrhage, hydrocephalus, mass lesion, acute infarction, or significant intracranial injury. Vascular: No hyperdense vessel or unexpected calcification. Skull: No acute calvarial abnormality. Sinuses/Orbits: No acute findings Other: None IMPRESSION: No acute intracranial abnormality. Electronically Signed   By: Charlett Nose  M.D.   On: 08/21/2023 00:46   CT CHEST ABDOMEN PELVIS W CONTRAST Result Date: 08/21/2023 CLINICAL DATA:  Sepsis EXAM: CT CHEST, ABDOMEN, AND PELVIS WITH CONTRAST TECHNIQUE: Multidetector CT imaging of the chest, abdomen and pelvis was performed following the standard protocol during bolus administration of intravenous contrast. RADIATION DOSE REDUCTION: This exam was performed according to the departmental dose-optimization program which includes automated exposure control, adjustment of the mA and/or kV according to patient size and/or use of iterative reconstruction technique. CONTRAST:  75mL OMNIPAQUE IOHEXOL 350 MG/ML SOLN COMPARISON:  04/25/2022 FINDINGS: CT CHEST FINDINGS Cardiovascular: Heart is normal size. Aorta is normal caliber. Scattered coronary artery and aortic atherosclerosis. Mediastinum/Nodes: No mediastinal, hilar, or axillary adenopathy. Trachea and esophagus are unremarkable. Thyroid unremarkable. Lungs/Pleura: Consolidation within the right lower lobe compatible with pneumonia. This is similar to prior study. Calcified granuloma  in the inferior right upper lobe. No confluent opacity on the left. Trace right pleural effusion. Musculoskeletal: Chest wall soft tissues are unremarkable. No acute bony abnormality. CT ABDOMEN PELVIS FINDINGS Hepatobiliary: Nodular contours of the liver compatible with cirrhosis. No focal hepatic abnormality. Gallbladder unremarkable. Pancreas: No focal abnormality or ductal dilatation. Spleen: No focal abnormality.  Normal size. Adrenals/Urinary Tract: No adrenal abnormality. No focal renal abnormality. No stones or hydronephrosis. Urinary bladder is unremarkable. Stomach/Bowel: Stomach, large and small bowel grossly unremarkable. Vascular/Lymphatic: Aortic atherosclerosis. No evidence of aneurysm or adenopathy. Reproductive: Prior hysterectomy.  No adnexal masses. Other: No free fluid or free air. Musculoskeletal: No acute bony abnormality. IMPRESSION:  Consolidation in the right lower lobe compatible with pneumonia. Scattered coronary artery disease, aortic atherosclerosis. Changes of cirrhosis. No acute findings in the abdomen or pelvis. Electronically Signed   By: Charlett Nose M.D.   On: 08/21/2023 00:46   DG Chest Port 1 View Result Date: 08/20/2023 CLINICAL DATA:  Questionable sepsis-evaluate for abnormality EXAM: PORTABLE CHEST 1 VIEW COMPARISON:  06/13/2022 FINDINGS: Cardiomegaly. Aortic atherosclerotic calcification. Pulmonary vascular congestion and interstitial coarsening. No focal consolidation, pleural effusion, or pneumothorax. No displaced rib fractures. Left TSA. IMPRESSION: Cardiomegaly with pulmonary vascular congestion and mild edema. Electronically Signed   By: Minerva Fester M.D.   On: 08/20/2023 22:48   US Abdomen Limited RUQ (LIVER/GB) Result Date: 08/06/2023 CLINICAL DATA:  Cirrhosis EXAM: ULTRASOUND ABDOMEN LIMITED RIGHT UPPER QUADRANT COMPARISON:  Abdominal ultrasound November 12, 2022 FINDINGS: Gallbladder: No gallstones or wall thickening visualized. No sonographic Murphy sign noted by sonographer. Common bile duct: Diameter: 2.8 mm Liver: Echogenic heterogeneous coarse appearing liver may correlate with the clinical diagnosis of hepatocellular disease such as cirrhosis without significant lobulation. Portal vein is patent on color Doppler imaging with normal direction of blood flow towards the liver. Other: None. IMPRESSION: Echogenic heterogeneous coarse appearing liver may correlate with the clinical diagnosis of hepatocellular disease such as cirrhosis without significant lobulation. Electronically Signed   By: Shaaron Adler M.D.   On: 08/06/2023 11:31    Microbiology: Results for orders placed or performed during the hospital encounter of 08/20/23  Resp panel by RT-PCR (RSV, Flu A&B, Covid) Anterior Nasal Swab     Status: None   Collection Time: 08/20/23 10:08 PM   Specimen: Anterior Nasal Swab  Result Value Ref Range  Status   SARS Coronavirus 2 by RT PCR NEGATIVE NEGATIVE Final   Influenza A by PCR NEGATIVE NEGATIVE Final   Influenza B by PCR NEGATIVE NEGATIVE Final    Comment: (NOTE) The Xpert Xpress SARS-CoV-2/FLU/RSV plus assay is intended as an aid in the diagnosis of influenza from Nasopharyngeal swab specimens and should not be used as a sole basis for treatment. Nasal washings and aspirates are unacceptable for Xpert Xpress SARS-CoV-2/FLU/RSV testing.  Fact Sheet for Patients: BloggerCourse.com  Fact Sheet for Healthcare Providers: SeriousBroker.it  This test is not yet approved or cleared by the [place] FDA and has been authorized for detection and/or diagnosis of SARS-CoV-2 by FDA under an Emergency Use Authorization (EUA). This EUA will remain in effect (meaning this test can be used) for the duration of the COVID-19 declaration under Section 564(b)(1) of the Act, 21 U.S.C. section 360bbb-3(b)(1), unless the authorization is terminated or revoked.     Resp Syncytial Virus by PCR NEGATIVE NEGATIVE Final    Comment: (NOTE) Fact Sheet for Patients: BloggerCourse.com  Fact Sheet for Healthcare Providers: SeriousBroker.it  This test is not yet approved or cleared  by the [place] and has been authorized for detection and/or diagnosis of SARS-CoV-2 by FDA under an Emergency Use Authorization (EUA). This EUA will remain in effect (meaning this test can be used) for the duration of the COVID-19 declaration under Section 564(b)(1) of the Act, 21 U.S.C. section 360bbb-3(b)(1), unless the authorization is terminated or revoked.  Performed at [facility], [address] [place], [place] [address]   Blood Culture (routine x 2)     Status: None   Collection Time: 08/20/23 10:08 PM   Specimen: BLOOD  Result Value Ref Range Status   Specimen Description BLOOD RIGHT  ANTECUBITAL  Final   Special Requests   Final    BOTTLES DRAWN AEROBIC AND ANAEROBIC Blood Culture results may not be optimal due to an inadequate volume of blood received in culture bottles   Culture   Final    NO GROWTH 5 DAYS Performed at [facility], [address], [place], [place] [address]    Report Status 08/25/2023 FINAL  Final  Blood Culture (routine x 2)     Status: None   Collection Time: 08/21/23 12:54 AM   Specimen: BLOOD RIGHT ARM  Result Value Ref Range Status   Specimen Description BLOOD RIGHT ARM  Final   Special Requests   Final    BOTTLES DRAWN AEROBIC AND ANAEROBIC Blood Culture adequate volume   Culture   Final    NO GROWTH 5 DAYS Performed at [facility], [address], [place], [place] [address]    Report Status 08/26/2023 FINAL  Final    Labs: CBC: Recent Labs  Lab 08/20/23 2250 08/21/23 0523 08/23/23 0432 08/24/23 0724 08/25/23 0858 08/26/23 0646  WBC 12.0* 12.2* 8.5 5.5 8.8 6.2  NEUTROABS 11.5* 10.2*  --   --   --   --   HGB 8.8* 8.4* 8.4* 8.9* 10.3* 9.3*  HCT 27.9* 27.1* 26.6* 27.9* 32.2* 29.6*  MCV 83.5 85.0 82.6 81.3 81.7 82.5  PLT 156 153 151 186 239 242   Basic Metabolic Panel: Recent Labs  Lab 08/21/23 0523 08/23/23 0432 08/24/23 0724 08/25/23 0858 08/26/23 0646  NA 131* 137 136 137 139  K 4.4 3.6 3.0* 3.6 3.3*  CL 101 101 97* 97* 98  CO2 23 25 29 30 28   GLUCOSE 174* 124* 129* 160* 118*  BUN 11 7* 8 10 9   CREATININE 0.76 0.59 0.56 0.59 0.52  CALCIUM 8.4* 9.3 9.2 10.1 9.4  MG 1.5*  --   --  1.7  --    Liver Function Tests: Recent Labs  Lab 08/20/23 2250 08/23/23 0432 08/24/23 0724 08/25/23 0858 08/26/23 0646  AST 27 16 18 22 17   ALT 15 12 13 16 15   ALKPHOS 74 62 67 71 68  BILITOT 0.7 0.6 0.6 0.5 0.6  PROT 7.1 6.6 6.8 7.7 6.8  ALBUMIN 3.7 2.9* 3.0* 3.5 3.1*   CBG: Recent Labs  Lab 08/25/23 1221 08/25/23 1634 08/25/23 2042 08/26/23 0746 08/26/23 1209  GLUCAP 170* 170* 212* 111* 190*     Discharge time spent: less than 30 minutes.  Signed: Rickey Barbara, MD [facility] 08/26/2023

## 2023-08-26 NOTE — Progress Notes (Signed)
 Patient has been discharged per MD order. IV has been removed and tolerated well. Discharge instructions reviewed with patient and verbalized understanding. TOC medications will be picked up on her way out and oxygen tank has been delivered to room. Instructions on how to use oxygen tank with precautions have been discussed, verbalized understanding. Family at bedside while patient get's dressed, bed in lowest position, and call light within reach.

## 2023-08-26 NOTE — Progress Notes (Signed)
 Occupational Therapy Treatment Patient Details Name: Ashley Riddle MRN: [id number] DOB: [birth year] Today's Date: 08/26/2023   History of present illness 78 y.o. female presents to [facility] hospital on 08/20/2023 with fevers, wheezing and SOB. CT concerning for RLL PNA. PMH includes HTN, DMII, hepatitis C, cocaine abuse, liver cirrhosis, COPD, gout, PVD, L TKA.   OT comments  Pt progressing well towards goals. ADL session completed with s for safety. Pt adamantly declined gait belt, RW, and socks despite education from therapist on safety practices. Education provided on energy conservation techniques d/t COPD. Continue to recommend HHOT to optimize independence levels. Will continue to follow acutely.       If plan is discharge home, recommend the following:  A little help with bathing/dressing/bathroom;A little help with walking and/or transfers   Equipment Recommendations  None recommended by OT    Recommendations for Other Services      Precautions / Restrictions Precautions Precautions: Fall Recall of Precautions/Restrictions: Intact Restrictions Weight Bearing Restrictions Per Provider Order: No       Mobility Bed Mobility Overal bed mobility: Needs Assistance             General bed mobility comments: Received in recliner    Transfers Overall transfer level: Needs assistance Equipment used: None Transfers: Sit to/from Stand Sit to Stand: Supervision           General transfer comment: pt declined walker to mobilize in the room, intermittent single UE support sought by pt.     Balance Overall balance assessment: Needs assistance Sitting-balance support: No upper extremity supported, Feet supported Sitting balance-Leahy Scale: Normal     Standing balance support: No upper extremity supported, During functional activity Standing balance-Leahy Scale: Good Standing balance comment: Would reach for sink or counter for support during dynamic standing                            ADL either performed or assessed with clinical judgement   ADL Overall ADL's : Needs assistance/impaired         Upper Body Bathing: Supervision/ safety;Standing Upper Body Bathing Details (indicate cue type and reason): Completed in hospital shower Lower Body Bathing: Supervison/ safety;Sit to/from stand Lower Body Bathing Details (indicate cue type and reason): Completed in hospital shower w/ handheld shower head Upper Body Dressing : Set up;Sitting   Lower Body Dressing: Set up;Sitting/lateral leans Lower Body Dressing Details (indicate cue type and reason): Pt able to cross legs to don socks with increased time Toilet Transfer: Supervision/safety;Ambulation Toilet Transfer Details (indicate cue type and reason): Simulated in room Toileting- Clothing Manipulation and Hygiene: Supervision/safety;Cueing for safety;Cueing for sequencing   Tub/ Shower Transfer: Walk-in shower;Supervision/safety;Ambulation   Functional mobility during ADLs: Supervision/safety General ADL Comments: Pt completed shower standing, adamantly declined socks, gait belt, and RW    Extremity/Trunk Assessment Upper Extremity Assessment Upper Extremity Assessment: Generalized weakness   Lower Extremity Assessment Lower Extremity Assessment: Defer to PT evaluation        Vision   Vision Assessment?: No apparent visual deficits   Perception     Praxis     Communication Communication Communication: No apparent difficulties   Cognition Arousal: Alert Behavior During Therapy: Impulsive Cognition: No apparent impairments             OT - Cognition Comments: Poor judgement with safety                 Following commands: Intact  Cueing   Cueing Techniques: Verbal cues  Exercises      Shoulder Instructions       General Comments Pt removed telemetry lines despite pleas from OT, RN notified    Pertinent Vitals/ Pain       Pain  Assessment Pain Assessment: Faces Faces Pain Scale: Hurts little more Pain Location: generalized Pain Descriptors / Indicators: Discomfort Pain Intervention(s): Monitored during session, Repositioned  Home Living                                          Prior Functioning/Environment              Frequency  Min 2X/week        Progress Toward Goals  OT Goals(current goals can now be found in the care plan section)  Progress towards OT goals: Progressing toward goals  Acute Rehab OT Goals Patient Stated Goal: To go home OT Goal Formulation: With patient/family Time For Goal Achievement: 09/06/23 Potential to Achieve Goals: Good ADL Goals Pt Will Perform Grooming: with modified independence;standing Pt Will Perform Lower Body Bathing: with modified independence;sit to/from stand Pt Will Perform Lower Body Dressing: with modified independence;sit to/from stand Pt Will Transfer to Toilet: with modified independence;ambulating Pt Will Perform Toileting - Clothing Manipulation and hygiene: with modified independence;sit to/from stand  Plan         AM-PAC OT "6 Clicks" Daily Activity     Outcome Measure   Help from another person eating meals?: None Help from another person taking care of personal grooming?: A Little Help from another person toileting, which includes using toliet, bedpan, or urinal?: A Little Help from another person bathing (including washing, rinsing, drying)?: A Little Help from another person to put on and taking off regular upper body clothing?: None Help from another person to put on and taking off regular lower body clothing?: A Little 6 Click Score: 20    End of Session Equipment Utilized During Treatment:  (Pt declined use of gait belt, socks, and RW)  OT Visit Diagnosis: Unsteadiness on feet (R26.81);Pain;Muscle weakness (generalized) (M62.81)   Activity Tolerance Patient tolerated treatment well   Patient Left in  bed;with call bell/phone within reach;with nursing/sitter in room   Nurse Communication Mobility status (Removal of telemetry line)        Time: 0981-1914 OT Time Calculation (min): 31 min  Charges: OT Treatments $Self Care/Home Management : 23-37 mins  Ivor Messier, OT  Acute Rehabilitation Services Office [phone] Secure chat preferred   Marilynne Drivers 08/26/2023, 1:30 PM

## 2023-09-01 DIAGNOSIS — M5440 Lumbago with sciatica, unspecified side: Secondary | ICD-10-CM | POA: Diagnosis not present

## 2023-09-01 DIAGNOSIS — J9601 Acute respiratory failure with hypoxia: Secondary | ICD-10-CM | POA: Diagnosis not present

## 2023-09-01 DIAGNOSIS — R7989 Other specified abnormal findings of blood chemistry: Secondary | ICD-10-CM | POA: Diagnosis not present

## 2023-09-01 DIAGNOSIS — Z789 Other specified health status: Secondary | ICD-10-CM | POA: Diagnosis not present

## 2023-09-01 DIAGNOSIS — R29818 Other symptoms and signs involving the nervous system: Secondary | ICD-10-CM | POA: Diagnosis not present

## 2023-09-01 DIAGNOSIS — G8929 Other chronic pain: Secondary | ICD-10-CM | POA: Diagnosis not present

## 2023-09-01 DIAGNOSIS — Y95 Nosocomial condition: Secondary | ICD-10-CM | POA: Diagnosis not present

## 2023-09-01 DIAGNOSIS — R600 Localized edema: Secondary | ICD-10-CM | POA: Diagnosis not present

## 2023-09-09 DIAGNOSIS — Y95 Nosocomial condition: Secondary | ICD-10-CM | POA: Diagnosis not present

## 2023-09-09 DIAGNOSIS — R600 Localized edema: Secondary | ICD-10-CM | POA: Diagnosis not present

## 2023-09-09 DIAGNOSIS — R7989 Other specified abnormal findings of blood chemistry: Secondary | ICD-10-CM | POA: Diagnosis not present

## 2023-09-18 ENCOUNTER — Other Ambulatory Visit: Payer: Self-pay | Admitting: Internal Medicine

## 2023-09-18 ENCOUNTER — Ambulatory Visit
Admission: RE | Admit: 2023-09-18 | Discharge: 2023-09-18 | Disposition: A | Discharge status: Home / Self Care | Source: Ambulatory Visit | Attending: Internal Medicine | Admitting: Internal Medicine

## 2023-09-18 DIAGNOSIS — I5031 Acute diastolic (congestive) heart failure: Secondary | ICD-10-CM | POA: Diagnosis not present

## 2023-09-18 DIAGNOSIS — I10 Essential (primary) hypertension: Secondary | ICD-10-CM | POA: Diagnosis not present

## 2023-09-18 DIAGNOSIS — R0602 Shortness of breath: Secondary | ICD-10-CM

## 2023-09-18 DIAGNOSIS — E876 Hypokalemia: Secondary | ICD-10-CM | POA: Diagnosis not present

## 2023-09-18 DIAGNOSIS — R29818 Other symptoms and signs involving the nervous system: Secondary | ICD-10-CM | POA: Diagnosis not present

## 2023-09-18 DIAGNOSIS — R600 Localized edema: Secondary | ICD-10-CM | POA: Diagnosis not present

## 2023-09-18 DIAGNOSIS — R051 Acute cough: Secondary | ICD-10-CM | POA: Diagnosis not present

## 2023-09-18 DIAGNOSIS — R7989 Other specified abnormal findings of blood chemistry: Secondary | ICD-10-CM | POA: Diagnosis not present

## 2023-09-23 DIAGNOSIS — R053 Chronic cough: Secondary | ICD-10-CM | POA: Diagnosis not present

## 2023-09-23 DIAGNOSIS — J301 Allergic rhinitis due to pollen: Secondary | ICD-10-CM | POA: Diagnosis not present

## 2023-09-26 DIAGNOSIS — J441 Chronic obstructive pulmonary disease with (acute) exacerbation: Secondary | ICD-10-CM | POA: Diagnosis not present

## 2023-10-15 ENCOUNTER — Encounter: Payer: Self-pay | Admitting: Podiatry

## 2023-10-15 ENCOUNTER — Ambulatory Visit: Payer: 59 | Admitting: Podiatry

## 2023-10-15 DIAGNOSIS — B351 Tinea unguium: Secondary | ICD-10-CM

## 2023-10-15 DIAGNOSIS — M79674 Pain in right toe(s): Secondary | ICD-10-CM

## 2023-10-15 DIAGNOSIS — M79675 Pain in left toe(s): Secondary | ICD-10-CM

## 2023-10-15 DIAGNOSIS — E1151 Type 2 diabetes mellitus with diabetic peripheral angiopathy without gangrene: Secondary | ICD-10-CM | POA: Diagnosis not present

## 2023-10-15 DIAGNOSIS — M2011 Hallux valgus (acquired), right foot: Secondary | ICD-10-CM

## 2023-10-15 DIAGNOSIS — M2012 Hallux valgus (acquired), left foot: Secondary | ICD-10-CM

## 2023-10-15 DIAGNOSIS — E119 Type 2 diabetes mellitus without complications: Secondary | ICD-10-CM

## 2023-10-15 DIAGNOSIS — L84 Corns and callosities: Secondary | ICD-10-CM | POA: Diagnosis not present

## 2023-10-15 NOTE — Progress Notes (Signed)
 ANNUAL DIABETIC FOOT EXAM  Subjective: Ashley Riddle presents today for annual diabetic foot exam. Chief Complaint  Patient presents with   Nail Problem    Pt presents for diabetic foot care.   Patient confirms h/o diabetes.  Patient denies any h/o foot wounds.  Nohemi Batters, MD is patient's PCP. LOV was 09/23/2023.  Past Medical History:  Diagnosis Date   Anxiety    Arthritis    CAP (community acquired pneumonia) 02/18/2017   Constipation    COPD (chronic obstructive pulmonary disease) (HCC)    DDD (degenerative disc disease), cervical    DDD (degenerative disc disease), lumbar    Gout    Heart murmur    probable bicuspid AV with mild AS, mild MR by 04/22/14 Echo (Dr. Emaline Handsome)   Hepatitis C    treated 2016    History of blood transfusion    Hypertension    Hypomagnesemia 03/02/2017   Pneumonia 12/02/2013   Sepsis (HCC) 04/19/2020   Patient Active Problem List   Diagnosis Date Noted   Sepsis due to pneumonia (HCC) 08/21/2023   Pneumonia 04/25/2022   Hyponatremia 04/25/2022   Carpal tunnel syndrome 11/17/2020   Arthritis 09/03/2020   Neuropathic pain 03/01/2020   S/P TKR (total knee replacement) using cement, left 12/14/2019   DM (diabetes mellitus) (HCC) 11/15/2019   Medication management 03/23/2019   Chronic pain of both knees 01/26/2019   Trochanteric bursitis, right hip 12/15/2018   Lumbar radiculopathy 11/06/2018   Acute right-sided low back pain 10/06/2018   COPD with acute exacerbation (HCC) 04/20/2018   Anxiety and depression 04/20/2018   Pain of left shoulder joint on movement 04/13/2017   Chronic bronchitis (HCC) 03/29/2017   Gouty arthropathy 03/02/2017   Hyperammonemia (HCC) 03/02/2017   Liver cirrhosis (HCC) 02/18/2017   Leukocytosis 02/18/2017   Sinus tachycardia 02/18/2017   Tachypnea 02/18/2017   RLL pneumonia 02/18/2017   Increased ammonia level 02/18/2017   Asthma 11/25/2016   Idiopathic progressive polyneuropathy  11/25/2016   Peripheral vascular disease (HCC) 11/25/2016   Obesity 11/25/2016   Prediabetes 11/02/2016   Retained metal fragment left scapula 12/26/2015   Fracture of glenoid process of left scapula 12/29/2014   Status post total shoulder arthroplasty 12/27/2014   Cocaine dependence in remission (HCC) 09/29/2014   Severe heroin dependence in sustained remission (HCC) 09/29/2014   Opiate abuse, episodic (HCC) 09/29/2014   Mild tetrahydrocannabinol (THC) abuse 09/29/2014   Chronic pain syndrome 04/18/2014   Primary osteoarthritis of left shoulder 04/18/2014   Primary osteoarthritis of right knee 04/18/2014   HYPERTENSION, BENIGN ESSENTIAL 03/22/2010   CONSTIPATION 03/22/2010   HEPATITIS C 06/04/1995   Past Surgical History:  Procedure Laterality Date   ABDOMINAL HYSTERECTOMY     APPENDECTOMY     BACK SURGERY     CESAREAN SECTION     x 3   COLONOSCOPY W/ POLYPECTOMY     HARDWARE REMOVAL Left 12/26/2015   Procedure: REMOVAL K-WIRE LEFT SCAPULA;  Surgeon: Shirlee Dotter, MD;  Location[facility] OR;  Service: Orthopedics;  Laterality: Left;   INNER EAR SURGERY     blood vessel   TONSILLECTOMY     TOTAL SHOULDER ARTHROPLASTY Left 12/27/2014   Procedure: TOTAL SHOULDER ARTHROPLASTY;  Surgeon: Shirlee Dotter, MD;  Location: [facility] OR;  Service: Orthopedics;  Laterality: Left;   Current Outpatient Medications on File Prior to Visit  Medication Sig Dispense Refill   acetaminophen  (TYLENOL ) 500 MG tablet Take 1,000 mg by mouth as needed for  mild pain (pain score 1-3).     albuterol  (VENTOLIN  HFA) 108 (90 Base) MCG/ACT inhaler Inhale 2 puffs into the lungs every 4 (four) hours as needed for wheezing or shortness of breath.     amLODipine  (NORVASC ) 10 MG tablet Take 10 mg by mouth daily.     aspirin  EC 81 MG tablet Take 81 mg by mouth daily. Swallow whole.     benzonatate  (TESSALON ) 100 MG capsule Take 1 capsule (100 mg total) by mouth 2 (two) times daily. 20 capsule 0   buprenorphine -naloxone   (SUBOXONE ) 2-0.5 mg SUBL SL tablet Place 9 tablets under the tongue daily. Verified with [facility] that she was getting the 2/0.5 mg SL tabs x 9 tablets for a total dose of 18 mg of buprenorphine      colchicine  0.6 MG tablet Take 0.6 mg by mouth daily as needed (as directed for gout flares).     cyanocobalamin  1000 MCG tablet Take 1,000 mcg by mouth once a week. Take on Monday     DULoxetine  (CYMBALTA ) 30 MG capsule Take 30 mg by mouth 2 (two) times daily.     ergocalciferol  (VITAMIN D2) 1.25 MG (50000 UT) capsule Take 50,000 Units by mouth every Monday.     ferrous sulfate 324 MG TBEC Take 324 mg by mouth daily.     furosemide  (LASIX ) 40 MG tablet Take 1 tablet (40 mg total) by mouth daily. 30 tablet 0   hydrALAZINE (APRESOLINE) 50 MG tablet Take 50 mg by mouth 2 (two) times daily.     Iron-FA-B Cmp-C-Biot-Probiotic (FUSION PLUS) CAPS Take 1 capsule by mouth daily.     JANUVIA 50 MG tablet Take 50 mg by mouth daily.     losartan  (COZAAR ) 100 MG tablet Take 100 mg by mouth daily.     Multiple Vitamin (MULTIVITAMIN WITH MINERALS) TABS tablet Take 1 tablet by mouth daily. One-A-Day for Women     oxyCODONE -acetaminophen  (PERCOCET) 10-325 MG tablet Take 1 tablet by mouth in the morning, at noon, and at bedtime.     rosuvastatin  (CRESTOR ) 5 MG tablet Take 5 mg by mouth daily.     sertraline  (ZOLOFT ) 50 MG tablet Take 50 mg by mouth daily.     tiZANidine  (ZANAFLEX ) 4 MG tablet Take 4 mg by mouth at bedtime.     No current facility-administered medications on file prior to visit.    Allergies  Allergen Reactions   Gabapentin  Other (See Comments)    Dizziness- States was admitted to hospital     Pregabalin Other (See Comments)    Confusion   Aspirin  Other (See Comments)    Due to history of hepatitis   Codeine Other (See Comments)    Makes the stomach cramp   Penicillins Hives, Swelling and Other (See Comments)    Tolerated ceftriaxone  and cefepime  many times    Empagliflozin  Swelling    Jardiance    Social History   Occupational History   Occupation: retired  Tobacco Use   Smoking status: Former    Current packs/day: 0.00    Average packs/day: 1 pack/day for 49.0 years (49.0 ttl pk-yrs)    Types: Cigarettes    Start date: 89    Quit date: 2007    Years since quitting: 18.3   Smokeless tobacco: Never   Tobacco comments:    quit 2007  Vaping Use   Vaping status: Never Used  Substance and Sexual Activity   Alcohol  use: Not Currently   Drug use: No  Types: Marijuana    Comment: quit marijuana 12/18. last used heroin and cocaine in 1992.  Smokes Marijuana once a week., 12/22/15- "2 weeks ago"   Sexual activity: Not Currently   Family History  Problem Relation Age of Onset   Heart disease Mother    Kidney disease Mother    Alcohol  abuse Mother    Breast cancer Daughter 19   Breast cancer Cousin 33   BRCA 1/2 Neg Hx    Immunization History  Administered Date(s) Administered   Influenza Inj Mdck Quad With Preservative 05/23/2020   Influenza Whole 03/22/2010   Influenza,inj,quad, With Preservative 04/11/2017   PFIZER(Purple Top)SARS-COV-2 Vaccination 08/07/2019, 08/28/2019   Pneumococcal Conjugate-13 10/09/2018   Zoster Recombinant(Shingrix) 02/18/2020     Review of Systems: Negative except as noted in the HPI.   Objective: There were no vitals filed for this visit.  Ashley Riddle is a pleasant 78 y.o. female in NAD. AAO X 3.  Diabetic foot exam was performed with the following findings:   Vascular Examination: CFT <3 seconds b/l LE. Faintly palpable pedal pulses b/l. Pedal hair absent. No pain with calf compression b/l. Lower extremity skin temperature gradient warm to cool. No edema noted b/l LE. No cyanosis or clubbing noted b/l LE.   Neurological Examination: Sensation grossly intact b/l with 10 gram monofilament. Vibratory sensation intact b/l. Pt has subjective symptoms of neuropathy.  Dermatological  Examination: Pedal skin warm and supple b/l.   No open wounds. No interdigital macerations.  Toenails 1-5 b/l thick, discolored, elongated with subungual debris and pain on dorsal palpation.    Hyperkeratotic lesion(s) submet head 5 b/l.  No erythema, no edema, no drainage, no fluctuance.  Musculoskeletal Examination: Muscle strength 5/5 to all lower extremity muscle groups bilaterally. HAV with bunion deformity noted b/l LE.  Radiographs: None     Lab Results  Component Value Date   HGBA1C 7.2 (H) 08/20/2023   ADA Risk Categorization: Low Risk :  Patient has all of the following: Intact protective sensation No prior foot ulcer  No severe deformity Pedal pulses present  Assessment: 1. Pain due to onychomycosis of toenails of both feet   2. Callus   3. Hallux valgus, acquired, bilateral   4. Type II diabetes mellitus with peripheral circulatory disorder (HCC)   5. Encounter for diabetic foot exam (HCC)     Plan: Diabetic foot examination performed. All patient's and/or POA's questions/concerns addressed on today's visit. Mycotic toenails 1-5 debrided in length and girth without incident. Callus(es) submet head 5 b/l pared with sharp debridement without incident. Continue daily foot inspections and monitor blood glucose per PCP/Endocrinologist's recommendations. Continue soft, supportive shoe gear daily. Report any pedal injuries to medical professional. Call office if there are any questions/concerns. -Patient/POA to call should there be question/concern in the interim. Return in about 3 months (around 01/15/2024).  Ashley Riddle, DPM      [place] LOCATION: [address], [place] [address]                   Office [phone]   [facility] LOCATION: [address] [place], [place] [address] Office [phone]

## 2023-10-17 ENCOUNTER — Ambulatory Visit (INDEPENDENT_AMBULATORY_CARE_PROVIDER_SITE_OTHER): Admitting: Podiatrist

## 2023-10-17 DIAGNOSIS — L84 Corns and callosities: Secondary | ICD-10-CM

## 2023-10-17 NOTE — Progress Notes (Signed)
 Ashley Riddle presents today as her diabetic shoe heel has come loose-  she would like to see if it can be glued back on as the shoes are no longer under warranty to be returned.   I applied shoe glue to the rubber piece on the heel and heated with heat gun.  The pieces were held together and look like they will stay together. I recommended she leave the shoe alone for 24 hours to allow the glue to set.    Related she is due for diabetic shoes around October-  she will llikely be ready for a new pair then.  If she has any more problems with the shoe she will call.

## 2023-10-24 DIAGNOSIS — M79641 Pain in right hand: Secondary | ICD-10-CM | POA: Diagnosis not present

## 2023-10-26 DIAGNOSIS — J441 Chronic obstructive pulmonary disease with (acute) exacerbation: Secondary | ICD-10-CM | POA: Diagnosis not present

## 2023-10-31 DIAGNOSIS — J9601 Acute respiratory failure with hypoxia: Secondary | ICD-10-CM | POA: Diagnosis not present

## 2023-10-31 DIAGNOSIS — R059 Cough, unspecified: Secondary | ICD-10-CM | POA: Diagnosis not present

## 2023-11-04 ENCOUNTER — Encounter (HOSPITAL_BASED_OUTPATIENT_CLINIC_OR_DEPARTMENT_OTHER): Payer: Self-pay | Admitting: Internal Medicine

## 2023-11-04 DIAGNOSIS — R29818 Other symptoms and signs involving the nervous system: Secondary | ICD-10-CM

## 2023-11-07 DIAGNOSIS — E1165 Type 2 diabetes mellitus with hyperglycemia: Secondary | ICD-10-CM | POA: Diagnosis not present

## 2023-11-07 DIAGNOSIS — R600 Localized edema: Secondary | ICD-10-CM | POA: Diagnosis not present

## 2023-11-07 DIAGNOSIS — D509 Iron deficiency anemia, unspecified: Secondary | ICD-10-CM | POA: Diagnosis not present

## 2023-11-07 DIAGNOSIS — I10 Essential (primary) hypertension: Secondary | ICD-10-CM | POA: Diagnosis not present

## 2023-11-12 ENCOUNTER — Ambulatory Visit (HOSPITAL_BASED_OUTPATIENT_CLINIC_OR_DEPARTMENT_OTHER): Attending: Internal Medicine | Admitting: Internal Medicine

## 2023-11-19 ENCOUNTER — Encounter (HOSPITAL_BASED_OUTPATIENT_CLINIC_OR_DEPARTMENT_OTHER): Payer: Self-pay | Admitting: Internal Medicine

## 2023-11-19 DIAGNOSIS — R0683 Snoring: Secondary | ICD-10-CM

## 2023-11-19 DIAGNOSIS — I272 Pulmonary hypertension, unspecified: Secondary | ICD-10-CM

## 2023-11-25 DIAGNOSIS — M79641 Pain in right hand: Secondary | ICD-10-CM | POA: Diagnosis not present

## 2023-12-23 DIAGNOSIS — M79641 Pain in right hand: Secondary | ICD-10-CM | POA: Diagnosis not present

## 2024-01-06 NOTE — Procedures (Signed)
 Orders only

## 2024-01-09 ENCOUNTER — Other Ambulatory Visit: Payer: Self-pay | Admitting: Nurse Practitioner

## 2024-01-09 DIAGNOSIS — K7469 Other cirrhosis of liver: Secondary | ICD-10-CM | POA: Diagnosis not present

## 2024-01-12 DIAGNOSIS — K7469 Other cirrhosis of liver: Secondary | ICD-10-CM | POA: Diagnosis not present

## 2024-01-20 DIAGNOSIS — Z1231 Encounter for screening mammogram for malignant neoplasm of breast: Secondary | ICD-10-CM | POA: Diagnosis not present

## 2024-01-20 DIAGNOSIS — Z1211 Encounter for screening for malignant neoplasm of colon: Secondary | ICD-10-CM | POA: Diagnosis not present

## 2024-01-20 DIAGNOSIS — R8290 Unspecified abnormal findings in urine: Secondary | ICD-10-CM | POA: Diagnosis not present

## 2024-01-20 DIAGNOSIS — Z122 Encounter for screening for malignant neoplasm of respiratory organs: Secondary | ICD-10-CM | POA: Diagnosis not present

## 2024-01-20 DIAGNOSIS — J441 Chronic obstructive pulmonary disease with (acute) exacerbation: Secondary | ICD-10-CM | POA: Diagnosis not present

## 2024-01-20 DIAGNOSIS — E1165 Type 2 diabetes mellitus with hyperglycemia: Secondary | ICD-10-CM | POA: Diagnosis not present

## 2024-01-20 DIAGNOSIS — Z23 Encounter for immunization: Secondary | ICD-10-CM | POA: Diagnosis not present

## 2024-01-20 DIAGNOSIS — E1142 Type 2 diabetes mellitus with diabetic polyneuropathy: Secondary | ICD-10-CM | POA: Diagnosis not present

## 2024-01-20 DIAGNOSIS — I10 Essential (primary) hypertension: Secondary | ICD-10-CM | POA: Diagnosis not present

## 2024-01-20 DIAGNOSIS — R7989 Other specified abnormal findings of blood chemistry: Secondary | ICD-10-CM | POA: Diagnosis not present

## 2024-01-20 DIAGNOSIS — Z0001 Encounter for general adult medical examination with abnormal findings: Secondary | ICD-10-CM | POA: Diagnosis not present

## 2024-01-20 DIAGNOSIS — Z13820 Encounter for screening for osteoporosis: Secondary | ICD-10-CM | POA: Diagnosis not present

## 2024-01-20 DIAGNOSIS — K7460 Unspecified cirrhosis of liver: Secondary | ICD-10-CM | POA: Diagnosis not present

## 2024-01-20 DIAGNOSIS — I5032 Chronic diastolic (congestive) heart failure: Secondary | ICD-10-CM | POA: Diagnosis not present

## 2024-01-20 DIAGNOSIS — E7849 Other hyperlipidemia: Secondary | ICD-10-CM | POA: Diagnosis not present

## 2024-01-21 ENCOUNTER — Ambulatory Visit (INDEPENDENT_AMBULATORY_CARE_PROVIDER_SITE_OTHER): Admitting: Podiatry

## 2024-01-21 ENCOUNTER — Encounter: Payer: Self-pay | Admitting: Podiatry

## 2024-01-21 ENCOUNTER — Other Ambulatory Visit: Payer: Self-pay | Admitting: Internal Medicine

## 2024-01-21 DIAGNOSIS — E1151 Type 2 diabetes mellitus with diabetic peripheral angiopathy without gangrene: Secondary | ICD-10-CM

## 2024-01-21 DIAGNOSIS — M79675 Pain in left toe(s): Secondary | ICD-10-CM | POA: Diagnosis not present

## 2024-01-21 DIAGNOSIS — B351 Tinea unguium: Secondary | ICD-10-CM | POA: Diagnosis not present

## 2024-01-21 DIAGNOSIS — M79674 Pain in right toe(s): Secondary | ICD-10-CM

## 2024-01-21 DIAGNOSIS — Z1231 Encounter for screening mammogram for malignant neoplasm of breast: Secondary | ICD-10-CM

## 2024-01-26 NOTE — Progress Notes (Signed)
  Subjective:  Patient ID: Ashley Riddle, female    DOB: 04-13-1946,  MRN: [id number]  Ashley Riddle presents to clinic today for at risk foot care. Pt has h/o NIDDM with PAD and painful thick toenails that are difficult to trim. Pain interferes with ambulation. Aggravating factors include wearing enclosed shoe gear. Pain is relieved with periodic professional debridement.  Chief Complaint  Patient presents with   Diabetes    DFC NIDDM A1C 6.5. Toenail trim. LOV with PCP 01/20/24.   New problem(s): None.   PCP is Bakare, Mobolaji B, MD.  Allergies  Allergen Reactions   Gabapentin  Other (See Comments)    Dizziness- States was admitted to hospital     Pregabalin Other (See Comments)    Confusion   Aspirin  Other (See Comments)    Due to history of hepatitis   Codeine Other (See Comments)    Makes the stomach cramp   Penicillins Hives, Swelling and Other (See Comments)    Tolerated ceftriaxone  and cefepime  many times   Empagliflozin  Swelling    Jardiance     Review of Systems: Negative except as noted in the HPI.  Objective: No changes noted in today's physical examination. There were no vitals filed for this visit. Ashley Riddle is a pleasant 78 y.o. female WD, WN in NAD. AAO x 3.  Vascular Examination: CFT <3 seconds b/l LE. Faintly palpable pedal pulses b/l. Pedal hair absent. No pain with calf compression b/l. Lower extremity skin temperature gradient warm to cool. No edema noted b/l LE. No cyanosis or clubbing noted b/l LE.   Neurological Examination: Sensation grossly intact b/l with 10 gram monofilament. Vibratory sensation intact b/l. Pt has subjective symptoms of neuropathy.  Dermatological Examination: Pedal skin warm and supple b/l.   No open wounds. No interdigital macerations.  Toenails 1-5 b/l thick, discolored, elongated with subungual debris and pain on dorsal palpation.    Resolved hyperkeratotic lesion(s) submet head 5 b/l.  No  erythema, no edema, no drainage, no fluctuance.  Musculoskeletal Examination: Muscle strength 5/5 to all lower extremity muscle groups bilaterally. HAV with bunion deformity noted b/l LE.  Radiographs: None  Assessment/Plan: 1. Pain due to onychomycosis of toenails of both feet   2. Type II diabetes mellitus with peripheral circulatory disorder [facility])   Patient was evaluated and treated. All patient's and/or POA's questions/concerns addressed on today's visit. Toenails 1-5 debrided in length and girth without incident. Treatment was provided by assistant Andrez Manchester under my supervision. Continue foot and shoe inspections daily. Monitor blood glucose per PCP/Endocrinologist's recommendations. Continue soft, supportive shoe gear daily. Report any pedal injuries to medical professional. Call office if there are any questions/concerns. Return in about 3 months (around 04/22/2024).  Ashley Riddle, DPM      [place] LOCATION: [address] [place] [address]                   Office [phone]   [facility] LOCATION: [address] [place], [place] [address] Office [phone]

## 2024-01-27 ENCOUNTER — Ambulatory Visit
Admission: RE | Admit: 2024-01-27 | Discharge: 2024-01-27 | Disposition: A | Discharge status: Home / Self Care | Source: Ambulatory Visit | Attending: Nurse Practitioner | Admitting: Nurse Practitioner

## 2024-01-27 DIAGNOSIS — K7460 Unspecified cirrhosis of liver: Secondary | ICD-10-CM | POA: Diagnosis not present

## 2024-01-27 DIAGNOSIS — K7469 Other cirrhosis of liver: Secondary | ICD-10-CM

## 2024-01-28 DIAGNOSIS — R0602 Shortness of breath: Secondary | ICD-10-CM | POA: Diagnosis not present

## 2024-01-28 DIAGNOSIS — Z23 Encounter for immunization: Secondary | ICD-10-CM | POA: Diagnosis not present

## 2024-01-28 DIAGNOSIS — E1142 Type 2 diabetes mellitus with diabetic polyneuropathy: Secondary | ICD-10-CM | POA: Diagnosis not present

## 2024-01-28 DIAGNOSIS — I739 Peripheral vascular disease, unspecified: Secondary | ICD-10-CM | POA: Diagnosis not present

## 2024-01-29 DIAGNOSIS — H35363 Drusen (degenerative) of macula, bilateral: Secondary | ICD-10-CM | POA: Diagnosis not present

## 2024-01-29 DIAGNOSIS — H35033 Hypertensive retinopathy, bilateral: Secondary | ICD-10-CM | POA: Diagnosis not present

## 2024-01-29 DIAGNOSIS — E119 Type 2 diabetes mellitus without complications: Secondary | ICD-10-CM | POA: Diagnosis not present

## 2024-01-29 DIAGNOSIS — H26491 Other secondary cataract, right eye: Secondary | ICD-10-CM | POA: Diagnosis not present

## 2024-02-05 ENCOUNTER — Emergency Department (HOSPITAL_COMMUNITY)

## 2024-02-05 ENCOUNTER — Encounter (HOSPITAL_COMMUNITY): Payer: Self-pay

## 2024-02-05 ENCOUNTER — Emergency Department (HOSPITAL_COMMUNITY)
Admission: EM | Admit: 2024-02-05 | Discharge: 2024-02-05 | Disposition: A | Discharge status: Home / Self Care | Attending: Emergency Medicine | Admitting: Emergency Medicine

## 2024-02-05 ENCOUNTER — Other Ambulatory Visit: Payer: Self-pay

## 2024-02-05 DIAGNOSIS — R0789 Other chest pain: Secondary | ICD-10-CM | POA: Diagnosis not present

## 2024-02-05 DIAGNOSIS — Z79899 Other long term (current) drug therapy: Secondary | ICD-10-CM | POA: Insufficient documentation

## 2024-02-05 DIAGNOSIS — K759 Inflammatory liver disease, unspecified: Secondary | ICD-10-CM | POA: Insufficient documentation

## 2024-02-05 DIAGNOSIS — Z96652 Presence of left artificial knee joint: Secondary | ICD-10-CM | POA: Insufficient documentation

## 2024-02-05 DIAGNOSIS — R079 Chest pain, unspecified: Secondary | ICD-10-CM | POA: Diagnosis not present

## 2024-02-05 DIAGNOSIS — M25561 Pain in right knee: Secondary | ICD-10-CM | POA: Insufficient documentation

## 2024-02-05 DIAGNOSIS — M25562 Pain in left knee: Secondary | ICD-10-CM | POA: Insufficient documentation

## 2024-02-05 DIAGNOSIS — M5030 Other cervical disc degeneration, unspecified cervical region: Secondary | ICD-10-CM | POA: Diagnosis not present

## 2024-02-05 DIAGNOSIS — Z7984 Long term (current) use of oral hypoglycemic drugs: Secondary | ICD-10-CM | POA: Diagnosis not present

## 2024-02-05 DIAGNOSIS — Z79891 Long term (current) use of opiate analgesic: Secondary | ICD-10-CM | POA: Insufficient documentation

## 2024-02-05 DIAGNOSIS — S0990XA Unspecified injury of head, initial encounter: Secondary | ICD-10-CM | POA: Diagnosis not present

## 2024-02-05 DIAGNOSIS — J449 Chronic obstructive pulmonary disease, unspecified: Secondary | ICD-10-CM | POA: Insufficient documentation

## 2024-02-05 DIAGNOSIS — K7460 Unspecified cirrhosis of liver: Secondary | ICD-10-CM | POA: Diagnosis not present

## 2024-02-05 DIAGNOSIS — M2341 Loose body in knee, right knee: Secondary | ICD-10-CM | POA: Diagnosis not present

## 2024-02-05 DIAGNOSIS — Z7982 Long term (current) use of aspirin: Secondary | ICD-10-CM | POA: Diagnosis not present

## 2024-02-05 DIAGNOSIS — F1111 Opioid abuse, in remission: Secondary | ICD-10-CM | POA: Diagnosis not present

## 2024-02-05 DIAGNOSIS — E119 Type 2 diabetes mellitus without complications: Secondary | ICD-10-CM | POA: Diagnosis not present

## 2024-02-05 DIAGNOSIS — Y92410 Unspecified street and highway as the place of occurrence of the external cause: Secondary | ICD-10-CM | POA: Insufficient documentation

## 2024-02-05 DIAGNOSIS — S199XXA Unspecified injury of neck, initial encounter: Secondary | ICD-10-CM | POA: Diagnosis not present

## 2024-02-05 DIAGNOSIS — I700 Atherosclerosis of aorta: Secondary | ICD-10-CM | POA: Diagnosis not present

## 2024-02-05 DIAGNOSIS — M1711 Unilateral primary osteoarthritis, right knee: Secondary | ICD-10-CM | POA: Insufficient documentation

## 2024-02-05 DIAGNOSIS — M160 Bilateral primary osteoarthritis of hip: Secondary | ICD-10-CM | POA: Diagnosis not present

## 2024-02-05 DIAGNOSIS — M25461 Effusion, right knee: Secondary | ICD-10-CM | POA: Diagnosis not present

## 2024-02-05 LAB — CBC
HCT: 32.1 % — ABNORMAL LOW (ref 36.0–46.0)
Hemoglobin: 9.8 g/dL — ABNORMAL LOW (ref 12.0–15.0)
MCH: 26.3 pg (ref 26.0–34.0)
MCHC: 30.5 g/dL (ref 30.0–36.0)
MCV: 86.3 fL (ref 80.0–100.0)
Platelets: 149 K/uL — ABNORMAL LOW (ref 150–400)
RBC: 3.72 MIL/uL — ABNORMAL LOW (ref 3.87–5.11)
RDW: 13.2 % (ref 11.5–15.5)
WBC: 4.1 K/uL (ref 4.0–10.5)
nRBC: 0 % (ref 0.0–0.2)

## 2024-02-05 LAB — COMPREHENSIVE METABOLIC PANEL WITH GFR
ALT: 15 U/L (ref 0–44)
AST: 23 U/L (ref 15–41)
Albumin: 3.7 g/dL (ref 3.5–5.0)
Alkaline Phosphatase: 71 U/L (ref 38–126)
Anion gap: 14 (ref 5–15)
BUN: 9 mg/dL (ref 8–23)
CO2: 23 mmol/L (ref 22–32)
Calcium: 9.6 mg/dL (ref 8.9–10.3)
Chloride: 101 mmol/L (ref 98–111)
Creatinine, Ser: 0.58 mg/dL (ref 0.44–1.00)
GFR, Estimated: >60 mL/min (ref >60)
Glucose, Bld: 177 mg/dL — ABNORMAL HIGH (ref 70–99)
Potassium: 3.7 mmol/L (ref 3.5–5.1)
Sodium: 138 mmol/L (ref 135–145)
Total Bilirubin: 0.7 mg/dL (ref 0.0–1.2)
Total Protein: 6.9 g/dL (ref 6.5–8.1)

## 2024-02-05 LAB — RAPID URINE DRUG SCREEN, HOSP PERFORMED
Amphetamines: NOT DETECTED
Barbiturates: NOT DETECTED
Benzodiazepines: NOT DETECTED
Cocaine: NOT DETECTED
Opiates: NOT DETECTED
Tetrahydrocannabinol: NOT DETECTED

## 2024-02-05 LAB — PROTIME-INR
INR: 1 (ref 0.8–1.2)
Prothrombin Time: 14.1 s (ref 11.4–15.2)

## 2024-02-05 LAB — ETHANOL: Alcohol, Ethyl (B): <15 mg/dL (ref <15)

## 2024-02-05 LAB — TROPONIN I (HIGH SENSITIVITY): Troponin I (High Sensitivity): 5 ng/L (ref <18)

## 2024-02-05 MED ORDER — KETOROLAC TROMETHAMINE 15 MG/ML IJ SOLN
15.0000 mg | Freq: Once | INTRAMUSCULAR | Status: AC
  Administered 2024-02-05: 15 mg via INTRAMUSCULAR
  Filled 2024-02-05: qty 1

## 2024-02-05 MED ORDER — LIDOCAINE 5 % EX PTCH
1.0000 | MEDICATED_PATCH | CUTANEOUS | Status: DC
  Administered 2024-02-05: 1 via TRANSDERMAL
  Filled 2024-02-05: qty 1

## 2024-02-05 MED ORDER — CYCLOBENZAPRINE HCL 10 MG PO TABS: 10.0000 mg | ORAL_TABLET | Freq: Two times a day (BID) | ORAL | 0 refills | Status: AC | PRN

## 2024-02-05 NOTE — ED Notes (Signed)
 Help get patient into a gown on the monitor patient has call bell in reach

## 2024-02-05 NOTE — ED Notes (Signed)
 Patient aware of urine sample. Will attempt after registration leaves room.

## 2024-02-05 NOTE — ED Notes (Signed)
 Called CCMD
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
## 2024-02-05 NOTE — ED Notes (Signed)
 Pt transported to restroom for urine sample.

## 2024-02-06 DIAGNOSIS — Z7689 Persons encountering health services in other specified circumstances: Secondary | ICD-10-CM | POA: Diagnosis not present

## 2024-02-06 DIAGNOSIS — I10 Essential (primary) hypertension: Secondary | ICD-10-CM | POA: Diagnosis not present

## 2024-02-06 DIAGNOSIS — M25561 Pain in right knee: Secondary | ICD-10-CM | POA: Diagnosis not present

## 2024-02-06 DIAGNOSIS — G8929 Other chronic pain: Secondary | ICD-10-CM | POA: Diagnosis not present

## 2024-02-06 DIAGNOSIS — S20212S Contusion of left front wall of thorax, sequela: Secondary | ICD-10-CM | POA: Diagnosis not present

## 2024-02-12 ENCOUNTER — Encounter (INDEPENDENT_AMBULATORY_CARE_PROVIDER_SITE_OTHER): Payer: Self-pay

## 2024-02-16 ENCOUNTER — Ambulatory Visit (INDEPENDENT_AMBULATORY_CARE_PROVIDER_SITE_OTHER): Admitting: Orthopaedic Surgery

## 2024-02-16 ENCOUNTER — Encounter: Payer: Self-pay | Admitting: Orthopaedic Surgery

## 2024-02-16 DIAGNOSIS — M25561 Pain in right knee: Secondary | ICD-10-CM

## 2024-02-16 DIAGNOSIS — G8929 Other chronic pain: Secondary | ICD-10-CM | POA: Diagnosis not present

## 2024-02-16 MED ORDER — LIDOCAINE HCL 1 % IJ SOLN
3.0000 mL | INTRAMUSCULAR | Status: AC | PRN
  Administered 2024-02-16: 3 mL

## 2024-02-16 MED ORDER — METHYLPREDNISOLONE ACETATE 40 MG/ML IJ SUSP
40.0000 mg | INTRAMUSCULAR | Status: AC | PRN
  Administered 2024-02-16: 40 mg via INTRA_ARTICULAR
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
## 2024-03-08 ENCOUNTER — Ambulatory Visit (INDEPENDENT_AMBULATORY_CARE_PROVIDER_SITE_OTHER)

## 2024-03-08 ENCOUNTER — Encounter (INDEPENDENT_AMBULATORY_CARE_PROVIDER_SITE_OTHER): Payer: Self-pay

## 2024-03-08 VITALS — BP 118/66 | HR 98 | Temp 97.9°F | Ht 61.0 in | Wt 195.0 lb

## 2024-03-08 DIAGNOSIS — H9191 Unspecified hearing loss, right ear: Secondary | ICD-10-CM

## 2024-03-08 DIAGNOSIS — Z23 Encounter for immunization: Secondary | ICD-10-CM | POA: Diagnosis not present

## 2024-03-08 DIAGNOSIS — H9041 Sensorineural hearing loss, unilateral, right ear, with unrestricted hearing on the contralateral side: Secondary | ICD-10-CM | POA: Diagnosis not present

## 2024-03-08 DIAGNOSIS — R0789 Other chest pain: Secondary | ICD-10-CM | POA: Diagnosis not present

## 2024-03-08 DIAGNOSIS — R252 Cramp and spasm: Secondary | ICD-10-CM | POA: Diagnosis not present

## 2024-03-08 DIAGNOSIS — I10 Essential (primary) hypertension: Secondary | ICD-10-CM | POA: Diagnosis not present

## 2024-03-08 DIAGNOSIS — D1809 Hemangioma of other sites: Secondary | ICD-10-CM

## 2024-03-08 DIAGNOSIS — H903 Sensorineural hearing loss, bilateral: Secondary | ICD-10-CM | POA: Diagnosis not present

## 2024-03-08 DIAGNOSIS — J410 Simple chronic bronchitis: Secondary | ICD-10-CM | POA: Diagnosis not present

## 2024-03-08 NOTE — Progress Notes (Signed)
 Dear Dr. Roanna, Here is my assessment for our mutual patient, Ashley Riddle. Thank you for allowing me the opportunity to care for your patient. Please do not hesitate to contact me should you have any other questions. Sincerely, Dr. Hadassah Parody  Otolaryngology Clinic Note Referring provider: Dr. Roanna HPI:   Discussed the use of AI scribe software for clinical note transcription with the patient, who gave verbal consent to proceed.  History of Present Illness Ashley Riddle is a 78 year old female who presents with worsening hearing loss on the right side. She is accompanied by her daughter.  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 No further surgeries have been performed on ear since adulthood, over ten years ago.  Her current hearing loss predominantly affects the right ear, necessitating frequent repetition during conversations. The left ear retains better hearing.    No recent formal hearing test with headphones has been conducted.  She denies recent bleeding from the ear, a symptom experienced prior to her initial surgery. Occasionally, the right ear feels 'stopped up'.  In terms of vision, she is nearsighted and has undergone cataract surgery, which significantly improved her vision. She has a history of childhood strabismus, with one eye not moving, but this is not a new issue.   Takes suboxone    Tobacco: smoked for 49 years, quit in 2008  PMH:  -CHF -HTN -cirrhosis (hep C) - treated - sees hepatologist  -diabetes -COPD   Independent Review of Additional Tests or Records:  Referral note from Ezekiel Roanna, MD on 01/21/24 : pt reporting loss of hearing and vision loss in right eye. Was referred to opthalmologist and to ENT for hearing test and evaluation.   CBC 02/05/24: Hgb 9.8     PMH/Meds/All/SocHx/FamHx/ROS:   Past Medical History:  Diagnosis Date   Anxiety    Arthritis    CAP (community acquired pneumonia) 02/18/2017   Constipation    COPD (chronic obstructive pulmonary disease) (HCC)    DDD (degenerative disc disease), cervical    DDD (degenerative disc disease), lumbar    Gout    Heart murmur    probable bicuspid AV with mild AS, mild MR by 04/22/14 Echo (Dr. Andria)   Hepatitis C    treated 2016    History of blood transfusion    Hypertension    Hypomagnesemia 03/02/2017   Pneumonia 12/02/2013   Sepsis (HCC) 04/19/2020     Past Surgical History:  Procedure Laterality Date   ABDOMINAL HYSTERECTOMY     APPENDECTOMY     BACK SURGERY     CESAREAN SECTION     x 3   COLONOSCOPY W/ POLYPECTOMY     HARDWARE REMOVAL Left 12/26/2015   Procedure: REMOVAL K-WIRE LEFT SCAPULA;  Surgeon: Maude LELON Right, MD;  Location: [facility] OR;  Service: Orthopedics;  Laterality: Left;   INNER EAR SURGERY     blood vessel   TONSILLECTOMY     TOTAL SHOULDER ARTHROPLASTY Left 12/27/2014   Procedure: TOTAL SHOULDER ARTHROPLASTY;  Surgeon: Maude LELON Right, MD;  Location: [facility] OR;  Service: Orthopedics;  Laterality: Left;    Family History  Problem Relation Age of Onset   Heart disease Mother    Kidney disease Mother    Alcohol  abuse Mother    Breast cancer Daughter 65  Breast cancer Cousin 50   BRCA 1/2 Neg Hx      Social Connections: Socially Isolated (08/21/2023)   Social Connection and Isolation Panel    Frequency of Communication with Friends and Family: More than three times a week    Frequency of Social Gatherings with Friends and Family: More than three times a week    Attends Religious Services: Never    Database administrator or Organizations: No    Attends Banker Meetings: Never    Marital Status: Widowed     Current Outpatient Medications  Medication Instructions   acetaminophen  (TYLENOL ) 1,000 mg, As needed   albuterol  (VENTOLIN   HFA) 108 (90 Base) MCG/ACT inhaler 2 puffs, Every 4 hours PRN   amLODipine  (NORVASC ) 10 mg, Daily   aspirin  EC 81 mg, Daily   benzonatate  (TESSALON ) 100 mg, Oral, 2 times daily   buprenorphine -naloxone  (SUBOXONE ) 2-0.5 mg SUBL SL tablet 9 tablets, Daily   colchicine  0.6 mg, Daily PRN   cyanocobalamin  1,000 mcg, Weekly   cyclobenzaprine  (FLEXERIL ) 10 mg, Oral, 2 times daily PRN   DULoxetine  (CYMBALTA ) 30 mg, 2 times daily   ergocalciferol  (VITAMIN D2) 50,000 Units, Every Mon   ferrous sulfate 324 mg, Daily   furosemide  (LASIX ) 40 mg, Oral, Daily   hydrALAZINE (APRESOLINE) 50 mg, 2 times daily   Iron-FA-B Cmp-C-Biot-Probiotic (FUSION PLUS) CAPS 1 capsule, Daily   Januvia 50 mg, Daily   losartan  (COZAAR ) 100 mg, Daily   Multiple Vitamin (MULTIVITAMIN WITH MINERALS) TABS tablet 1 tablet, Daily   oxyCODONE -acetaminophen  (PERCOCET) 10-325 MG tablet 1 tablet, 3 times daily   rosuvastatin  (CRESTOR ) 5 mg, Daily   sertraline  (ZOLOFT ) 50 mg, Daily   tiZANidine  (ZANAFLEX ) 4 mg, Daily at bedtime     Physical Exam:   BP 118/66 (BP Location: Right Arm, Patient Position: Sitting)   Pulse 98   Temp 97.9 F (36.6 C)   Ht 5' 1 (1.549 m)   Wt 195 lb (88.5 kg)   SpO2 94%   BMI 36.84 kg/m   Salient findings:  CN II-XII intact with the exception of CNVIII on the right and left lateral rectus palsy is present (not new) Given history and complaints, ear microscopy was indicated and performed for evaluation with findings as below in physical exam section and in procedures Bilateral EAC clear and TM intact with well pneumatized middle ear spaces. No evidence of new glomus tumor.  No lesions of oral cavity/oropharynx No obviously palpable neck masses/lymphadenopathy/thyromegaly No respiratory distress or stridor  Seprately Identifiable Procedures:  Prior to initiating any procedures, risks/benefits/alternatives were explained to the patient and verbal consent obtained.  Procedure (03/08/2024):  Bilateral ear microscopy using microscope (CPT [id number]) Pre-procedure diagnosis: history of blood vessel tumor on the right, hearing loss on right  Post-procedure diagnosis: same Indication: see above; given patient's otologic complaints and history, for improved and comprehensive examination of external ear and tympanic membrane, bilateral otologic examination using microscope was performed. Prior to proceeding, verbal consent was obtained after discussion of R/B/A  Procedure: Patient was placed semi-recumbent. Both ear canals were examined using the microscope with findings above. Patient tolerated the procedure well.   Impression & Plans:  Ashley Riddle is a 78 y.o. 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 No recent bleeding or visible abnormalities. - Order audiometric hearing test to assess severity of  right-sided hearing loss. - Consider imaging if significant unilateral hearing loss is confirmed to evaluate for tumor recurrence or other causes of unilateral hearing loss    Thank you for allowing me the opportunity to care for your patient. Please do not hesitate to contact me should you have any other questions.  Sincerely, Hadassah Parody, MD Otolaryngologist (ENT), [facility] Phone: [phone] Fax: [phone]

## 2024-03-12 ENCOUNTER — Encounter (INDEPENDENT_AMBULATORY_CARE_PROVIDER_SITE_OTHER): Payer: Self-pay

## 2024-03-12 ENCOUNTER — Ambulatory Visit (INDEPENDENT_AMBULATORY_CARE_PROVIDER_SITE_OTHER)

## 2024-03-12 ENCOUNTER — Ambulatory Visit (INDEPENDENT_AMBULATORY_CARE_PROVIDER_SITE_OTHER): Admitting: Audiology

## 2024-03-12 VITALS — BP 140/75 | HR 78

## 2024-03-12 DIAGNOSIS — H9311 Tinnitus, right ear: Secondary | ICD-10-CM

## 2024-03-12 DIAGNOSIS — H903 Sensorineural hearing loss, bilateral: Secondary | ICD-10-CM

## 2024-03-12 DIAGNOSIS — D1809 Hemangioma of other sites: Secondary | ICD-10-CM

## 2024-03-12 DIAGNOSIS — H90A21 Sensorineural hearing loss, unilateral, right ear, with restricted hearing on the contralateral side: Secondary | ICD-10-CM | POA: Diagnosis not present

## 2024-03-12 NOTE — Progress Notes (Signed)
 Dear Dr. Roanna, Here is my assessment for our mutual patient, Ashley Riddle. Thank you for allowing me the opportunity to care for your patient. Please do not hesitate to contact me should you have any other questions. Sincerely, Dr. Hadassah Parody  Otolaryngology Clinic Note Referring provider: Dr. Roanna HPI:   Discussed the use of AI scribe software for clinical note transcription with the patient, who gave verbal consent to proceed.  Initial HPI (03/08/24)  Ashley Riddle is a 78 year old female who presents with worsening hearing loss on the right side. She is accompanied by her daughter.  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 No further surgeries have been performed on ear since adulthood, over ten years ago.  Her current hearing loss predominantly affects the right ear, necessitating frequent repetition during conversations. The left ear retains better hearing.    No recent formal hearing test with headphones has been conducted.  She denies recent bleeding from the ear, a symptom experienced prior to her initial surgery. Occasionally, the right ear feels 'stopped up'.  In terms of vision, she is nearsighted and has undergone cataract surgery, which significantly improved her vision. She has a history of childhood strabismus, with one eye not moving, but this is not a new issue.  Interval (03/12/2024)   History of Present Illness Ashley Riddle is a 78 year old female with a history of a vascular tumor who presents with asymmetric hearing loss who presents for f/u with audiogram.   No major changes in hearing or other symptoms since last visit.  Reports chronic unilateral tinnitus on that side as well   Imaging tolerance and cardiac history - No history of allergies to contrast or  adverse reactions to imaging procedures - Previous imaging procedures without complications - No pacemaker - History of heart murmur   Takes suboxone    Tobacco: smoked for 49 years, quit in 2008  PMH:  -CHF -HTN -cirrhosis (hep C) - treated - sees hepatologist  -diabetes -COPD   Independent Review of Additional Tests or Records:   03/12/24 Audiogram was independently reviewed and interpreted by me and it reveals Right ear: moderate to severe; type A tympanogram Left ear: moderate to moderately-severe; ; type A tympanogram       PMH/Meds/All/SocHx/FamHx/ROS:   Past Medical History:  Diagnosis Date   Anxiety    Arthritis    CAP (community acquired pneumonia) 02/18/2017   Constipation    COPD (chronic obstructive pulmonary disease) (HCC)    DDD (degenerative disc disease), cervical    DDD (degenerative disc disease), lumbar    Gout    Heart murmur    probable bicuspid AV with mild AS, mild MR by 04/22/14 Echo (Dr. Andria)   Hepatitis C    treated 2016    History of blood transfusion    Hypertension    Hypomagnesemia 03/02/2017   Pneumonia 12/02/2013   Sepsis (HCC) 04/19/2020     Past Surgical History:  Procedure Laterality Date   ABDOMINAL HYSTERECTOMY     APPENDECTOMY     BACK SURGERY     CESAREAN SECTION     x 3   COLONOSCOPY W/ POLYPECTOMY     HARDWARE REMOVAL Left 12/26/2015   Procedure: REMOVAL K-WIRE LEFT SCAPULA;  Surgeon: Maude LELON Right, MD;  Location: [facility]  OR;  Service: Orthopedics;  Laterality: Left;   INNER EAR SURGERY     blood vessel   TONSILLECTOMY     TOTAL SHOULDER ARTHROPLASTY Left 12/27/2014   Procedure: TOTAL SHOULDER ARTHROPLASTY;  Surgeon: Maude LELON Right, MD;  Location: [facility] OR;  Service: Orthopedics;  Laterality: Left;    Family History  Problem Relation Age of Onset   Heart disease Mother    Kidney disease Mother    Alcohol  abuse Mother    Breast cancer Daughter 65   Breast cancer Cousin 48   BRCA 1/2 Neg Hx       Social Connections: Socially Isolated (08/21/2023)   Social Connection and Isolation Panel    Frequency of Communication with Friends and Family: More than three times a week    Frequency of Social Gatherings with Friends and Family: More than three times a week    Attends Religious Services: Never    Database Administrator or Organizations: No    Attends Banker Meetings: Never    Marital Status: Widowed     Current Outpatient Medications  Medication Instructions   acetaminophen  (TYLENOL ) 1,000 mg, As needed   albuterol  (VENTOLIN  HFA) 108 (90 Base) MCG/ACT inhaler 2 puffs, Every 4 hours PRN   amLODipine  (NORVASC ) 10 mg, Daily   aspirin  EC 81 mg, Daily   benzonatate  (TESSALON ) 100 mg, Oral, 2 times daily   buprenorphine -naloxone  (SUBOXONE ) 2-0.5 mg SUBL SL tablet 9 tablets, Daily   colchicine  0.6 mg, Daily PRN   cyanocobalamin  1,000 mcg, Weekly   cyclobenzaprine  (FLEXERIL ) 10 mg, Oral, 2 times daily PRN   DULoxetine  (CYMBALTA ) 30 mg, 2 times daily   ergocalciferol  (VITAMIN D2) 50,000 Units, Every Mon   ferrous sulfate 324 mg, Daily   furosemide  (LASIX ) 40 mg, Oral, Daily   hydrALAZINE (APRESOLINE) 50 mg, 2 times daily   Iron-FA-B Cmp-C-Biot-Probiotic (FUSION PLUS) CAPS 1 capsule, Daily   Januvia 50 mg, Daily   losartan  (COZAAR ) 100 mg, Daily   Multiple Vitamin (MULTIVITAMIN WITH MINERALS) TABS tablet 1 tablet, Daily   oxyCODONE -acetaminophen  (PERCOCET) 10-325 MG tablet 1 tablet, 3 times daily   rosuvastatin  (CRESTOR ) 5 mg, Daily   sertraline  (ZOLOFT ) 50 mg, Daily   tiZANidine  (ZANAFLEX ) 4 mg, Daily at bedtime     Physical Exam:   BP (!) 140/75   Pulse 78   SpO2 98%   Salient findings:  CN II-XII intact with the exception of CNVIII on the right and left lateral rectus palsy is present (not new) Given history and complaints, ear microscopy was indicated and performed for evaluation with findings as below in physical exam section and in procedures Bilateral  EAC clear and TM intact with well pneumatized middle ear spaces. No evidence of new glomus tumor.  No lesions of oral cavity/oropharynx No obviously palpable neck masses/lymphadenopathy/thyromegaly No respiratory distress or stridor  Seprately Identifiable Procedures:  Prior to initiating any procedures, risks/benefits/alternatives were explained to the patient and verbal consent obtained.  Procedure (03/12/2024): Bilateral ear microscopy using microscope (CPT [id number]) Pre-procedure diagnosis: unilateral tinnitus, right sided hearing loss, hx of glomus tumor Post-procedure diagnosis: same Indication: see above; given patient's otologic complaints and history, for improved and comprehensive examination of external ear and tympanic membrane, bilateral otologic examination using microscope was performed. Prior to proceeding, verbal consent was obtained after discussion of R/B/A  Procedure: Patient was placed semi-recumbent. Both ear canals were examined using the microscope with findings above. Patient tolerated the procedure well.    Impression &  Plans:  Ashley Riddle is a 78 y.o. female with   1. Sensorineural hearing loss (SNHL) of right ear with restricted hearing of left ear   2. Asymmetric SNHL (sensorineural hearing loss)   3. Glomus tumor of ear   4. Tinnitus of right ear    Assessment & Plan  Right-sided sensorineural hearing loss, chronic  Right-sided sensorineural hearing loss with significant interaural difference, necessitating further investigation for underlying causes such as vascular tumor recurrence. History of glomus tumor right ear  Right sided tinnitus, chronic  - Order MRI to evaluate hearing loss. - Discussed MRI procedure, including loud noise and IV requirements, confirmed no contrast allergies or pacemaker. - Provided contact number for MRI scheduling, advised to call on Monday. - Plan follow-up post-MRI to discuss results and next steps. - If MRI is clear,  discuss hearing aid options if interested   Thank you for allowing me the opportunity to care for your patient. Please do not hesitate to contact me should you have any other questions.  Sincerely, Hadassah Parody, MD Otolaryngologist (ENT), [facility] Phone: [phone] Fax: [phone]

## 2024-03-12 NOTE — Patient Instructions (Signed)
 I have ordered an imaging study for you to complete prior to your next visit. Please call [facility] Scheduling at [phone] to schedule your imaging if you have not received a call within 24 hours. If you are unable to complete your imaging study prior to your next scheduled visit please call our office to let us  know.

## 2024-03-12 NOTE — Progress Notes (Signed)
  [address] [place], [place] [address] [phone]  Audiological Evaluation    Name: Ashley Riddle     DOB:   11-09-1945      MRN:   [id number]                                                                                     Service Date: 03/12/2024     Accompanied by: unaccompanied to the booth    Patient comes today after Dr. Greggory sent a referral for a hearing evaluation due to concerns with hearing loss asymmetry.   Symptoms Yes Details  Hearing loss  [x]  Reports right hearing loss for years.  Tinnitus  [x]  Sometimes hears a buzz in the right ear.  Ear pain/ infections/pressure  []    Balance problems  []    Noise exposure history  []    Previous ear surgeries  []    Family history of hearing loss  []    Amplification  []    Other  []      Otoscopy: Right ear: Clear external ear canal and notable landmarks visualized on the tympanic membrane. Left ear:  Clear external ear canal and notable landmarks visualized on the tympanic membrane.  Tympanometry: Right ear: Type A- Normal external ear canal volume with normal middle ear pressure and tympanic membrane compliance. Left ear: Type A- Normal external ear canal volume with normal middle ear pressure and tympanic membrane compliance.   Pure tone Audiometry: Right ear- Borderline normal to moderately severe essentially sensorineural hearing loss from 125 Hz - 8000 Hz. Left ear-  Normal to moderate sensorineural hearing loss from 125 Hz - 8000 Hz.  Speech Audiometry: Right ear- Speech Reception Threshold (SRT) was obtained at 50 dBHL. Left ear-Speech Reception Threshold (SRT) was obtained at 35 dBHL.   Word Recognition Score Tested using NU-6 (recorded) Right ear: 76% was obtained at a presentation level of 85 dBHL with contralateral masking which is deemed as  fair. Left ear: 76% was obtained at a presentation level of 75 dBHL with contralateral masking which is deemed as  fair.   The hearing  test results were completed under headphones and re-checked with inserts and results are deemed to be of fair reliability. Test technique:  conventional    Impression: There is a significant difference in pure-tone thresholds between ears.   Recommendations: Follow up with ENT as scheduled for today. Return for a hearing evaluation if concerns with hearing changes arise or per MD recommendation. Consider a communication needs assessment after medical clearance for hearing aids is obtained.   Ashley Riddle MARIE LEROUX-MARTINEZ, AUD

## 2024-03-16 DIAGNOSIS — Z23 Encounter for immunization: Secondary | ICD-10-CM | POA: Diagnosis not present

## 2024-03-23 ENCOUNTER — Ambulatory Visit (HOSPITAL_COMMUNITY)
Admission: RE | Admit: 2024-03-23 | Discharge: 2024-03-23 | Disposition: A | Discharge status: Home / Self Care | Source: Ambulatory Visit

## 2024-03-23 DIAGNOSIS — H90A21 Sensorineural hearing loss, unilateral, right ear, with restricted hearing on the contralateral side: Secondary | ICD-10-CM | POA: Insufficient documentation

## 2024-03-23 MED ORDER — GADOBUTROL 1 MMOL/ML IV SOLN
8.5000 mL | Freq: Once | INTRAVENOUS | Status: AC | PRN
  Administered 2024-03-23: 8.5 mL via INTRAVENOUS

## 2024-04-05 ENCOUNTER — Encounter: Payer: Self-pay | Admitting: Radiology

## 2024-04-08 ENCOUNTER — Encounter (INDEPENDENT_AMBULATORY_CARE_PROVIDER_SITE_OTHER): Payer: Self-pay

## 2024-04-08 ENCOUNTER — Ambulatory Visit (INDEPENDENT_AMBULATORY_CARE_PROVIDER_SITE_OTHER)

## 2024-04-08 VITALS — BP 150/77 | HR 87

## 2024-04-08 DIAGNOSIS — Z87898 Personal history of other specified conditions: Secondary | ICD-10-CM | POA: Diagnosis not present

## 2024-04-08 DIAGNOSIS — H9311 Tinnitus, right ear: Secondary | ICD-10-CM | POA: Diagnosis not present

## 2024-04-08 DIAGNOSIS — H903 Sensorineural hearing loss, bilateral: Secondary | ICD-10-CM | POA: Diagnosis not present

## 2024-04-08 DIAGNOSIS — H90A21 Sensorineural hearing loss, unilateral, right ear, with restricted hearing on the contralateral side: Secondary | ICD-10-CM

## 2024-04-08 DIAGNOSIS — D1809 Hemangioma of other sites: Secondary | ICD-10-CM

## 2024-04-08 NOTE — Progress Notes (Signed)
 Dear Dr. Roanna, Here is my assessment for our mutual patient, Ashley Riddle. Thank you for allowing me the opportunity to care for your patient. Please do not hesitate to contact me should you have any other questions. Sincerely, Dr. Hadassah Parody  Otolaryngology Clinic Note Referring provider: Dr. Roanna HPI:   Discussed the use of AI scribe software for clinical note transcription with the patient, who gave verbal consent to proceed.  Initial HPI (03/08/24)  Ashley Riddle is a 78 year old Riddle who presents with worsening hearing loss on the right side. She is accompanied by her daughter.  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 No further surgeries have been performed on ear since adulthood, over ten years ago.  Her current hearing loss predominantly affects the right ear, necessitating frequent repetition during conversations. The left ear retains better hearing.    No recent formal hearing test with headphones has been conducted.  She denies recent bleeding from the ear, a symptom experienced prior to her initial surgery. Occasionally, the right ear feels 'stopped up'.  In terms of vision, she is nearsighted and has undergone cataract surgery, which significantly improved her vision. She has a history of childhood strabismus, with one eye not moving, but this is not a new issue.  Interval (03/12/2024)    No major changes in hearing or other symptoms since last visit.  Reports chronic unilateral tinnitus on that side as well   Imaging tolerance and cardiac history - No history of allergies to contrast or adverse reactions to imaging procedures - Previous imaging procedures without complications - No pacemaker - History of heart  murmur  --------------------------------------------------------- 04/08/2024  Returns for follow-up after MRI. No changes in symptoms.  Interested in hearing aids.    Takes suboxone    Tobacco: smoked for 49 years, quit in 2008  PMH:  -CHF -HTN -cirrhosis (hep C) - treated - sees hepatologist  -diabetes -COPD   Independent Review of Additional Tests or Records:   03/12/24 Audiogram was independently reviewed and interpreted by me and it reveals Right ear: moderate to severe; type A tympanogram Left ear: moderate to moderately-severe; ; type A tympanogram      MRI IAC 03/25/24 reviewed and shows no evidence of vestibular schwannoma   PMH/Meds/All/SocHx/FamHx/ROS:   Past Medical History:  Diagnosis Date   Anxiety    Arthritis    CAP (community acquired pneumonia) 02/18/2017   Constipation    COPD (chronic obstructive pulmonary disease) (HCC)    DDD (degenerative disc disease), cervical    DDD (degenerative disc disease), lumbar    Gout    Heart murmur    probable bicuspid AV with mild AS, mild MR by 04/22/14 Echo (Dr. Andria)   Hepatitis C    treated 2016    History of blood transfusion    Hypertension    Hypomagnesemia 03/02/2017   Pneumonia 12/02/2013   Sepsis (HCC) 04/19/2020     Past Surgical History:  Procedure Laterality Date   ABDOMINAL HYSTERECTOMY     APPENDECTOMY     BACK SURGERY     CESAREAN SECTION     x 3   COLONOSCOPY W/ POLYPECTOMY     HARDWARE REMOVAL Left 12/26/2015   Procedure: REMOVAL K-WIRE LEFT SCAPULA;  Surgeon: Maude LELON Right, MD;  Location: [facility] OR;  Service: Orthopedics;  Laterality: Left;   INNER EAR SURGERY     blood vessel   TONSILLECTOMY     TOTAL SHOULDER ARTHROPLASTY Left 12/27/2014   Procedure: TOTAL SHOULDER ARTHROPLASTY;  Surgeon: Maude LELON Right, MD;  Location[facility] OR;  Service: Orthopedics;  Laterality: Left;    Family History  Problem Relation Age of Onset   Heart disease Mother    Kidney disease Mother     Alcohol  abuse Mother    Breast cancer Daughter 65   Breast cancer Cousin 56   BRCA 1/2 Neg Hx      Social Connections: Socially Isolated (08/21/2023)   Social Connection and Isolation Panel    Frequency of Communication with Friends and Family: More than three times a week    Frequency of Social Gatherings with Friends and Family: More than three times a week    Attends Religious Services: Never    Database Administrator or Organizations: No    Attends Banker Meetings: Never    Marital Status: Widowed     Current Outpatient Medications  Medication Instructions   acetaminophen  (TYLENOL ) 1,000 mg, As needed   albuterol  (VENTOLIN  HFA) 108 (90 Base) MCG/ACT inhaler 2 puffs, Every 4 hours PRN   amLODipine  (NORVASC ) 10 mg, Daily   aspirin  EC 81 mg, Daily   benzonatate  (TESSALON ) 100 mg, Oral, 2 times daily   buprenorphine -naloxone  (SUBOXONE ) 2-0.5 mg SUBL SL tablet 9 tablets, Daily   colchicine  0.6 mg, Daily PRN   cyanocobalamin  1,000 mcg, Weekly   cyclobenzaprine  (FLEXERIL ) 10 mg, Oral, 2 times daily PRN   DULoxetine  (CYMBALTA ) 30 mg, 2 times daily   ergocalciferol  (VITAMIN D2) 50,000 Units, Every Mon   ferrous sulfate 324 mg, Daily   furosemide  (LASIX ) 40 mg, Oral, Daily   hydrALAZINE (APRESOLINE) 50 mg, 2 times daily   Iron-FA-B Cmp-C-Biot-Probiotic (FUSION PLUS) CAPS 1 capsule, Daily   Januvia 50 mg, Daily   losartan  (COZAAR ) 100 mg, Daily   Multiple Vitamin (MULTIVITAMIN WITH MINERALS) TABS tablet 1 tablet, Daily   oxyCODONE -acetaminophen  (PERCOCET) 10-325 MG tablet 1 tablet, 3 times daily   rosuvastatin  (CRESTOR ) 5 mg, Daily   sertraline  (ZOLOFT ) 50 mg, Daily   tiZANidine  (ZANAFLEX ) 4 mg, Daily at bedtime     Physical Exam:   BP (!) 150/77 (BP Location: Right Arm, Patient Position: Sitting) Comment: did not take bp medication  Pulse 87   SpO2 92%   Salient findings:  CN II-XII intact with the exception of CNVIII on the right and left lateral rectus palsy  is present (not new) Bilateral EAC clear and TM intact with well pneumatized middle ear spaces. No evidence of new glomus tumor.  No lesions of oral cavity/oropharynx No obviously palpable neck masses/lymphadenopathy/thyromegaly No respiratory distress or stridor  Seprately Identifiable Procedures:  Prior to initiating any procedures, risks/benefits/alternatives were explained to the patient and verbal consent obtained. None   Impression & Plans:  Amarah Brossman is a 78 y.o. Riddle with   1. Asymmetric SNHL (sensorineural hearing loss)   2. Sensorineural hearing loss (SNHL) of right ear with restricted hearing of left ear   3. Tinnitus of right ear   4. Glomus tumor of ear     Assessment & Plan  Bilateral sensorineural hearing loss (right worse than left)  History of glomus tumor right ear  Right sided tinnitus, chronic  - Asymmetric hearing loss: MRI obtained and no evidence of schwannoma  - Discussed tinnitus likely due to hearing loss  -  She is interested in hearing aids. Provided her with referral numbers for hearing aid consultation   Thank you for allowing me the opportunity to care for your patient. Please do not hesitate to contact me should you have any other questions.  Sincerely, Hadassah Parody, MD Otolaryngologist (ENT), [facility] Phone: [phone] Fax: [phone]

## 2024-04-27 ENCOUNTER — Ambulatory Visit
Admission: RE | Admit: 2024-04-27 | Discharge: 2024-04-27 | Disposition: A | Discharge status: Home / Self Care | Source: Ambulatory Visit | Attending: Internal Medicine | Admitting: Internal Medicine

## 2024-04-27 DIAGNOSIS — Z1231 Encounter for screening mammogram for malignant neoplasm of breast: Secondary | ICD-10-CM

## 2024-05-11 ENCOUNTER — Ambulatory Visit: Admitting: Podiatry

## 2024-05-11 ENCOUNTER — Encounter: Payer: Self-pay | Admitting: Podiatry

## 2024-05-11 DIAGNOSIS — B351 Tinea unguium: Secondary | ICD-10-CM | POA: Diagnosis not present

## 2024-05-11 DIAGNOSIS — E1151 Type 2 diabetes mellitus with diabetic peripheral angiopathy without gangrene: Secondary | ICD-10-CM | POA: Diagnosis not present

## 2024-05-11 DIAGNOSIS — M79675 Pain in left toe(s): Secondary | ICD-10-CM | POA: Diagnosis not present

## 2024-05-11 DIAGNOSIS — M79674 Pain in right toe(s): Secondary | ICD-10-CM | POA: Diagnosis not present

## 2024-05-11 DIAGNOSIS — L84 Corns and callosities: Secondary | ICD-10-CM | POA: Diagnosis not present

## 2024-05-16 NOTE — Progress Notes (Signed)
°  Subjective:  Patient ID: Ashley Riddle, female    DOB: 1946/05/24,  MRN: [id number]  Ashley Riddle presents to clinic today for at risk foot care. Pt has h/o NIDDM with PAD and painful mycotic toenails of both feet that are difficult to trim. Pain interferes with daily activities and wearing enclosed shoe gear comfortably.  Chief Complaint  Patient presents with   [facility]    RM#[id number] 08/20/23 A1c 7.2 next PCP appt not scheduled patient has concerns.BS this am 123.   New problem(s): None.   PCP is Bakare, Mobolaji B, MD.  Allergies[1]  Review of Systems: Negative except as noted in the HPI.  Objective:  There were no vitals filed for this visit. Ashley Riddle is a pleasant 78 y.o. female WD, WN in NAD. AAO x 3.  Vascular Examination: CFT <3 seconds b/l LE. Faintly palpable pedal pulses b/l. Pedal hair absent. No pain with calf compression b/l. Lower extremity skin temperature gradient warm to cool. No edema noted b/l LE. No cyanosis or clubbing noted b/l LE.   Neurological Examination: Sensation grossly intact b/l with 10 gram monofilament. Vibratory sensation intact b/l. Pt has subjective symptoms of neuropathy.  Dermatological Examination: Pedal skin warm and supple b/l.   No open wounds. No interdigital macerations.  Toenails 1-5 b/l thick, discolored, elongated with subungual debris and pain on dorsal palpation.    Hyperkeratotic lesion(s) submet head 5 b/l.  No erythema, no edema, no drainage, no fluctuance.  Musculoskeletal Examination: Muscle strength 5/5 to all lower extremity muscle groups bilaterally. HAV with bunion deformity noted b/l LE.  Radiographs: None  Assessment/Plan: 1. Pain due to onychomycosis of toenails of both feet   2. Callus   3. Type II diabetes mellitus with peripheral circulatory disorder [facility])   Consent given for treatment. Patient examined. All patient's and/or POA's questions/concerns addressed on today's visit. Mycotic  toenails 1-5 b/l debrided in length and girth without incident. Calluses submet head 5 b/l pared with sharp debridement . Continue foot and shoe inspections daily. Monitor blood glucose per PCP/Endocrinologist's recommendations.Continue soft, supportive shoe gear daily. Report any pedal injuries to medical professional. Call office if there are any quesitons/concerns. -Patient/POA to call should there be question/concern in the interim.  Return in about 3 months (around 08/09/2024).  Delon LITTIE Merlin, DPM      [place] LOCATION: [address] [place], [place] [address]                   Office [phone]   [place] LOCATION: [address] [place], [place] [address] Office [phone]     [1]  Allergies Allergen Reactions   Gabapentin  Other (See Comments)    Dizziness- States was admitted to hospital     Pregabalin Other (See Comments)    Confusion   Aspirin  Other (See Comments)    Due to history of hepatitis   Codeine Other (See Comments)    Makes the stomach cramp   Penicillins Hives, Swelling and Other (See Comments)    Tolerated ceftriaxone  and cefepime  many times   Empagliflozin  Swelling    Jardiance

## 2024-08-24 ENCOUNTER — Ambulatory Visit: Admitting: Podiatry
